# Patient Record
Sex: Female | Born: 1985 | Race: White | Hispanic: No | Marital: Single | Smoking: Current every day smoker
Health system: Southern US, Community
[De-identification: ages and names within clinical notes are randomized; demographics above are authoritative.]

## PROBLEM LIST (undated history)

## (undated) DIAGNOSIS — F419 Anxiety disorder, unspecified: Secondary | ICD-10-CM

## (undated) DIAGNOSIS — F1020 Alcohol dependence, uncomplicated: Secondary | ICD-10-CM

## (undated) DIAGNOSIS — K219 Gastro-esophageal reflux disease without esophagitis: Secondary | ICD-10-CM

## (undated) DIAGNOSIS — F17200 Nicotine dependence, unspecified, uncomplicated: Secondary | ICD-10-CM

## (undated) DIAGNOSIS — D696 Thrombocytopenia, unspecified: Secondary | ICD-10-CM

## (undated) DIAGNOSIS — B1920 Unspecified viral hepatitis C without hepatic coma: Secondary | ICD-10-CM

## (undated) DIAGNOSIS — D649 Anemia, unspecified: Secondary | ICD-10-CM

## (undated) DIAGNOSIS — K922 Gastrointestinal hemorrhage, unspecified: Secondary | ICD-10-CM

## (undated) DIAGNOSIS — F319 Bipolar disorder, unspecified: Secondary | ICD-10-CM

## (undated) DIAGNOSIS — K759 Inflammatory liver disease, unspecified: Secondary | ICD-10-CM

## (undated) DIAGNOSIS — F1011 Alcohol abuse, in remission: Secondary | ICD-10-CM

## (undated) DIAGNOSIS — F32A Depression, unspecified: Secondary | ICD-10-CM

## (undated) DIAGNOSIS — R569 Unspecified convulsions: Secondary | ICD-10-CM

## (undated) DIAGNOSIS — T8859XA Other complications of anesthesia, initial encounter: Secondary | ICD-10-CM

## (undated) DIAGNOSIS — R748 Abnormal levels of other serum enzymes: Secondary | ICD-10-CM

## (undated) DIAGNOSIS — T4145XA Adverse effect of unspecified anesthetic, initial encounter: Secondary | ICD-10-CM

## (undated) DIAGNOSIS — M1990 Unspecified osteoarthritis, unspecified site: Secondary | ICD-10-CM

## (undated) DIAGNOSIS — K226 Gastro-esophageal laceration-hemorrhage syndrome: Secondary | ICD-10-CM

## (undated) DIAGNOSIS — N189 Chronic kidney disease, unspecified: Secondary | ICD-10-CM

## (undated) DIAGNOSIS — C801 Malignant (primary) neoplasm, unspecified: Secondary | ICD-10-CM

## (undated) DIAGNOSIS — R011 Cardiac murmur, unspecified: Secondary | ICD-10-CM

## (undated) DIAGNOSIS — F209 Schizophrenia, unspecified: Secondary | ICD-10-CM

## (undated) DIAGNOSIS — F329 Major depressive disorder, single episode, unspecified: Secondary | ICD-10-CM

## (undated) DIAGNOSIS — E039 Hypothyroidism, unspecified: Secondary | ICD-10-CM

## (undated) DIAGNOSIS — S82141A Displaced bicondylar fracture of right tibia, initial encounter for closed fracture: Secondary | ICD-10-CM

## (undated) DIAGNOSIS — J45909 Unspecified asthma, uncomplicated: Secondary | ICD-10-CM

## (undated) HISTORY — PX: ORIF TIBIA PLATEAU: SHX2132

## (undated) HISTORY — PX: APPENDECTOMY: SHX54

## (undated) HISTORY — PX: DILATION AND CURETTAGE OF UTERUS: SHX78

## (undated) HISTORY — PX: HERNIA REPAIR: SHX51

---
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
## 2012-05-13 ENCOUNTER — Encounter (HOSPITAL_COMMUNITY): Payer: Self-pay | Admitting: *Deleted

## 2012-05-13 ENCOUNTER — Inpatient Hospital Stay (HOSPITAL_COMMUNITY)
Admission: AD | Admit: 2012-05-13 | Discharge: 2012-05-16 | DRG: 897 | Disposition: A | Discharge status: Home / Self Care | Payer: Medicaid Other | Attending: Psychiatry | Admitting: Psychiatry

## 2012-05-13 DIAGNOSIS — B1920 Unspecified viral hepatitis C without hepatic coma: Secondary | ICD-10-CM | POA: Diagnosis present

## 2012-05-13 DIAGNOSIS — F10239 Alcohol dependence with withdrawal, unspecified: Principal | ICD-10-CM | POA: Diagnosis not present

## 2012-05-13 DIAGNOSIS — F1994 Other psychoactive substance use, unspecified with psychoactive substance-induced mood disorder: Secondary | ICD-10-CM | POA: Diagnosis present

## 2012-05-13 DIAGNOSIS — F1020 Alcohol dependence, uncomplicated: Secondary | ICD-10-CM | POA: Diagnosis present

## 2012-05-13 DIAGNOSIS — Z560 Unemployment, unspecified: Secondary | ICD-10-CM

## 2012-05-13 DIAGNOSIS — F10939 Alcohol use, unspecified with withdrawal, unspecified: Principal | ICD-10-CM | POA: Diagnosis not present

## 2012-05-13 DIAGNOSIS — F39 Unspecified mood [affective] disorder: Secondary | ICD-10-CM | POA: Diagnosis present

## 2012-05-13 DIAGNOSIS — Z79899 Other long term (current) drug therapy: Secondary | ICD-10-CM

## 2012-05-13 HISTORY — DX: Major depressive disorder, single episode, unspecified: F32.9

## 2012-05-13 HISTORY — DX: Depression, unspecified: F32.A

## 2012-05-13 HISTORY — DX: Inflammatory liver disease, unspecified: K75.9

## 2012-05-13 MED ORDER — MAGNESIUM HYDROXIDE 400 MG/5ML PO SUSP: 30.0000 mL | Freq: Every day | ORAL | Status: DC | PRN

## 2012-05-13 MED ORDER — ONDANSETRON 4 MG PO TBDP
4.0000 mg | ORAL_TABLET | Freq: Four times a day (QID) | ORAL | Status: DC | PRN
  Administered 2012-05-13 – 2012-05-14 (×2): 4 mg via ORAL
  Filled 2012-05-13: qty 1

## 2012-05-13 MED ORDER — TRAZODONE HCL 50 MG PO TABS
50.0000 mg | ORAL_TABLET | Freq: Every evening | ORAL | Status: DC | PRN
  Administered 2012-05-13 – 2012-05-15 (×4): 50 mg via ORAL
  Filled 2012-05-13 (×9): qty 1

## 2012-05-13 MED ORDER — CHLORDIAZEPOXIDE HCL 25 MG PO CAPS
25.0000 mg | ORAL_CAPSULE | Freq: Four times a day (QID) | ORAL | Status: DC | PRN
  Administered 2012-05-13 – 2012-05-14 (×2): 25 mg via ORAL
  Filled 2012-05-13 (×2): qty 1

## 2012-05-13 MED ORDER — ADULT MULTIVITAMIN W/MINERALS CH
1.0000 | ORAL_TABLET | Freq: Every day | ORAL | Status: DC
  Filled 2012-05-13 (×2): qty 1

## 2012-05-13 MED ORDER — LOPERAMIDE HCL 2 MG PO CAPS: 2.0000 mg | ORAL_CAPSULE | ORAL | Status: DC | PRN

## 2012-05-13 MED ORDER — VITAMIN B-1 100 MG PO TABS
100.0000 mg | ORAL_TABLET | Freq: Every day | ORAL | Status: DC
  Filled 2012-05-13 (×2): qty 1

## 2012-05-13 MED ORDER — ACETAMINOPHEN 325 MG PO TABS: 650.0000 mg | ORAL_TABLET | Freq: Four times a day (QID) | ORAL | Status: DC | PRN

## 2012-05-13 MED ORDER — ALUM & MAG HYDROXIDE-SIMETH 200-200-20 MG/5ML PO SUSP: 30.0000 mL | ORAL | Status: DC | PRN

## 2012-05-13 MED ORDER — THIAMINE HCL 100 MG/ML IJ SOLN
100.0000 mg | Freq: Once | INTRAMUSCULAR | Status: AC
  Administered 2012-05-13: 100 mg via INTRAMUSCULAR

## 2012-05-13 MED ORDER — HYDROXYZINE HCL 25 MG PO TABS
25.0000 mg | ORAL_TABLET | Freq: Four times a day (QID) | ORAL | Status: DC | PRN
  Administered 2012-05-14 – 2012-05-16 (×5): 25 mg via ORAL

## 2012-05-13 MED ORDER — NICOTINE 21 MG/24HR TD PT24
21.0000 mg | MEDICATED_PATCH | Freq: Every day | TRANSDERMAL | Status: DC
  Administered 2012-05-13 – 2012-05-16 (×4): 21 mg via TRANSDERMAL
  Filled 2012-05-13 (×7): qty 1

## 2012-05-13 NOTE — Tx Team (Signed)
Initial Interdisciplinary Treatment Plan  PATIENT STRENGTHS: (choose at least two) Ability for insight Average or above average intelligence Capable of independent living Communication skills General fund of knowledge Motivation for treatment/growth Physical Health Supportive family/friends  PATIENT STRESSORS: Financial difficulties Substance abuse   PROBLEM LIST: Problem List/Patient Goals Date to be addressed Date deferred Reason deferred Estimated date of resolution  "To detox" 05/13/12     Increased risk for suicide 05/13/12     Substance abuse 05/13/12                                          DISCHARGE CRITERIA:  Ability to meet basic life and health needs Adequate post-discharge living arrangements Improved stabilization in mood, thinking, and/or behavior Medical problems require only outpatient monitoring Motivation to continue treatment in a less acute level of care Need for constant or close observation no longer present Reduction of life-threatening or endangering symptoms to within safe limits Safe-care adequate arrangements made Verbal commitment to aftercare and medication compliance Withdrawal symptoms are absent or subacute and managed without 24-hour nursing intervention  PRELIMINARY DISCHARGE PLAN: Attend 12-step recovery group Outpatient therapy Participate in family therapy Placement in alternative living arrangements  PATIENT/FAMIILY INVOLVEMENT: This treatment plan has been presented to and reviewed with the patient, Dawn Foley, and/or family member.  The patient and family have been given the opportunity to ask questions and make suggestions.  Fransico Michael [facility] 05/13/2012, 9:32 PM

## 2012-05-13 NOTE — BH Assessment (Signed)
Assessment Note   Dawn Foley is an 26 y.o. female. Patient presents to [facility] emergency room requesting detox from alcohol.  She also endorses suicidal ideation with a plan to drink herself to death. Patients BAC was 0.30, patient has presented  to the hospital four times in the last week and 1/2, requesting detox. She typically drinks 18 pack of beer and  gal of liquor daily.  Current CIWA 11 and is recieving ativan in ED for withdrawel symptoms. She reports she is  drinking this amount for two months after release from prison. (six months DWI).  Extensive history of chronic alcoholism and describe six attempts in treatment.  Denies homicidal ideation denies, symptoms of psychosis.  Endorses depressive symptoms; despondency, irritability, hopelessness, worthlessness, loss of appetite, insomnia and guilt.  Patient is alert and oriented, tearful at times, c/o anxiety, racing thoughts. She has left after a few days of treatment each time she has sought treatment. Accepted for inpatient treatment by Dr. Franchot Gallo MD   Axis I: Mood Disorder NOS and Alcohol Dep Axis II: Deferred Axis III:  Past Medical History  Diagnosis Date  . Depression    Axis IV: housing problems, other psychosocial or environmental problems and problems with primary support group Axis V: 31-40 impairment in reality testing  Past Medical History:  Past Medical History  Diagnosis Date  . Depression     No past surgical history on file.  Family History: No family history on file.  Social History:  reports that she has been smoking Cigarettes.  She has been smoking about 1 pack per day. She does not have any smokeless tobacco history on file. She reports that she drinks about 35.4 ounces of alcohol per week. She reports that she does not use illicit drugs.  Additional Social History:  Alcohol / Drug Use Pain Medications: not abusing Prescriptions: not abusing Over the Counter: nos History of  alcohol / drug use?: Yes Substance #1 Name of Substance 1: alcohol 1 - Age of First Use: 14 1 - Amount (size/oz): 18 beers and 1/2 gallon liquor 1 - Frequency: daily 1 - Duration: 2 months 1 - Last Use / Amount: 05/13/2012  CIWA: CIWA-Ar Nausea and Vomiting: no nausea and no vomiting Tactile Disturbances: none Tremor: two Auditory Disturbances: very mild harshness or ability to frighten Paroxysmal Sweats: no sweat visible Visual Disturbances: very mild sensitivity Anxiety: three Headache, Fullness in Head: none present Agitation: moderately fidgety and restless Orientation and Clouding of Sensorium: oriented and can do serial additions CIWA-Ar Total: 11  COWS:    Allergies: Allergies no known allergies  Home Medications:  (Not in a hospital admission)  OB/GYN Status:  No LMP recorded.  General Assessment Data Location of Assessment: [facility] Services Living Arrangements: Spouse/significant other Can pt return to current living arrangement?: Yes Admission Status: Voluntary Is patient capable of signing voluntary admission?: Yes Transfer from: Acute Hospital Referral Source: MD  Education Status Is patient currently in school?: No Highest grade of school patient has completed: 12  Risk to self Suicidal Ideation: Yes-Currently Present Suicidal Intent: Yes-Currently Present Is patient at risk for suicide?: Yes Suicidal Plan?: Yes-Currently Present Specify Current Suicidal Plan: drink herself to death Access to Means: Yes Specify Access to Suicidal Means: drinks daily What has been your use of drugs/alcohol within the last 12 months?: alcohol Previous Attempts/Gestures: No Intentional Self Injurious Behavior: None Family Suicide History: Unknown Persecutory voices/beliefs?: No Depression: Yes Depression Symptoms: Guilt;Feeling worthless/self pity;Feeling angry/irritable Substance abuse  history and/or treatment for substance abuse?: Yes Suicide prevention  information given to non-admitted patients: Not applicable  Risk to Others Homicidal Ideation: No Thoughts of Harm to Others: No Current Homicidal Intent: No Current Homicidal Plan: No Access to Homicidal Means: No History of harm to others?: No Assessment of Violence: None Noted Does patient have access to weapons?: No Criminal Charges Pending?: No Does patient have a court date: No  Psychosis Hallucinations: None noted Delusions: None noted  Mental Status Report Appear/Hygiene: Disheveled Eye Contact: Fair Motor Activity: Restlessness;Agitation Speech: Logical/coherent Level of Consciousness: Alert Mood: Anxious;Depressed;Worthless, low self-esteem Affect: Anxious;Depressed Anxiety Level: Moderate Thought Processes: Coherent;Relevant Judgement: Impaired Orientation: Person;Place;Time;Situation Obsessive Compulsive Thoughts/Behaviors: None  Cognitive Functioning Concentration: Decreased Memory: Recent Intact;Remote Intact IQ: Average Insight: Poor Impulse Control: Poor Appetite: Fair Sleep: Decreased Total Hours of Sleep:  (not restful) Vegetative Symptoms: Decreased grooming  ADLScreening [facility]) Patient's cognitive ability adequate to safely complete daily activities?: Yes Patient able to express need for assistance with ADLs?: Yes Independently performs ADLs?: Yes (appropriate for developmental age)  Abuse/Neglect [facility]) Physical Abuse: Denies Verbal Abuse: Denies Sexual Abuse: Denies  Prior Inpatient Therapy Prior Inpatient Therapy: Yes Prior Therapy Dates: 2012, 04/2012 Prior Therapy Facilty/Provider(s): [facility] Reason for Treatment: Alcohol Dep  Prior Outpatient Therapy Prior Outpatient Therapy: Yes Prior Therapy Dates: several pt does not specify Prior Therapy Facilty/Provider(s): pt can not specify Reason for Treatment: alcohol Dep  ADL Screening (condition at time of admission) Patient's cognitive ability  adequate to safely complete daily activities?: Yes Patient able to express need for assistance with ADLs?: Yes Independently performs ADLs?: Yes (appropriate for developmental age) Weakness of Legs: None Weakness of Arms/Hands: None  Home Assistive Devices/Equipment Home Assistive Devices/Equipment: None    Abuse/Neglect Assessment (Assessment to be complete while patient is alone) Physical Abuse: Denies Verbal Abuse: Denies Sexual Abuse: Denies Exploitation of patient/patient's resources: Denies Self-Neglect: Denies     Merchant navy officer (For Healthcare) Advance Directive: Patient does not have advance directive Pre-existing out of facility DNR order (yellow form or pink MOST form): No Nutrition Screen- [facility] Adult/[facility]/AP Patient's home diet: Regular  Additional Information 1:1 In Past 12 Months?: No CIRT Risk: No Elopement Risk: No Does patient have medical clearance?: Yes     Disposition:  Disposition Disposition of Patient: Inpatient treatment program  On Site Evaluation by:   Reviewed with Physician:     Conan Bowens 05/13/2012 6:00 PM

## 2012-05-14 DIAGNOSIS — F1020 Alcohol dependence, uncomplicated: Secondary | ICD-10-CM | POA: Diagnosis present

## 2012-05-14 MED ORDER — ONDANSETRON 4 MG PO TBDP: 4.0000 mg | ORAL_TABLET | Freq: Four times a day (QID) | ORAL | Status: DC | PRN

## 2012-05-14 MED ORDER — NAPROXEN 500 MG PO TABS: 500.0000 mg | ORAL_TABLET | Freq: Two times a day (BID) | ORAL | Status: DC | PRN

## 2012-05-14 MED ORDER — CHLORDIAZEPOXIDE HCL 25 MG PO CAPS
25.0000 mg | ORAL_CAPSULE | Freq: Three times a day (TID) | ORAL | Status: AC
  Administered 2012-05-15 (×3): 25 mg via ORAL
  Filled 2012-05-14 (×3): qty 1

## 2012-05-14 MED ORDER — CHLORDIAZEPOXIDE HCL 25 MG PO CAPS: 25.0000 mg | ORAL_CAPSULE | Freq: Every day | ORAL | Status: DC

## 2012-05-14 MED ORDER — CLONIDINE HCL 0.1 MG PO TABS: 0.1000 mg | ORAL_TABLET | ORAL | Status: DC

## 2012-05-14 MED ORDER — CLONIDINE HCL 0.1 MG PO TABS
0.1000 mg | ORAL_TABLET | Freq: Four times a day (QID) | ORAL | Status: DC
  Filled 2012-05-14: qty 1

## 2012-05-14 MED ORDER — VITAMIN B-1 100 MG PO TABS
100.0000 mg | ORAL_TABLET | Freq: Every day | ORAL | Status: DC
  Administered 2012-05-15 – 2012-05-16 (×2): 100 mg via ORAL
  Filled 2012-05-14 (×3): qty 1

## 2012-05-14 MED ORDER — ONDANSETRON 4 MG PO TBDP
4.0000 mg | ORAL_TABLET | Freq: Four times a day (QID) | ORAL | Status: DC | PRN
  Administered 2012-05-14: 4 mg via ORAL

## 2012-05-14 MED ORDER — DICYCLOMINE HCL 20 MG PO TABS: 20.0000 mg | ORAL_TABLET | Freq: Four times a day (QID) | ORAL | Status: DC | PRN

## 2012-05-14 MED ORDER — HYDROXYZINE HCL 25 MG PO TABS: 25.0000 mg | ORAL_TABLET | Freq: Four times a day (QID) | ORAL | Status: DC | PRN

## 2012-05-14 MED ORDER — LOPERAMIDE HCL 2 MG PO CAPS: 2.0000 mg | ORAL_CAPSULE | ORAL | Status: DC | PRN

## 2012-05-14 MED ORDER — CHLORDIAZEPOXIDE HCL 25 MG PO CAPS
25.0000 mg | ORAL_CAPSULE | Freq: Four times a day (QID) | ORAL | Status: AC
  Administered 2012-05-14 (×4): 25 mg via ORAL
  Filled 2012-05-14 (×4): qty 1

## 2012-05-14 MED ORDER — CLONIDINE HCL 0.1 MG PO TABS: 0.1000 mg | ORAL_TABLET | Freq: Every day | ORAL | Status: DC

## 2012-05-14 MED ORDER — CHLORDIAZEPOXIDE HCL 25 MG PO CAPS
25.0000 mg | ORAL_CAPSULE | ORAL | Status: DC
  Administered 2012-05-16: 25 mg via ORAL
  Filled 2012-05-14: qty 1

## 2012-05-14 MED ORDER — ADULT MULTIVITAMIN W/MINERALS CH
1.0000 | ORAL_TABLET | Freq: Every day | ORAL | Status: DC
  Administered 2012-05-14 – 2012-05-16 (×3): 1 via ORAL
  Filled 2012-05-14 (×4): qty 1

## 2012-05-14 MED ORDER — METHOCARBAMOL 500 MG PO TABS: 500.0000 mg | ORAL_TABLET | Freq: Three times a day (TID) | ORAL | Status: DC | PRN

## 2012-05-14 MED ORDER — CHLORDIAZEPOXIDE HCL 25 MG PO CAPS
25.0000 mg | ORAL_CAPSULE | Freq: Four times a day (QID) | ORAL | Status: DC | PRN
Start: 2012-05-14 — End: 2012-05-16
  Administered 2012-05-14 – 2012-05-16 (×6): 25 mg via ORAL
  Filled 2012-05-14 (×6): qty 1

## 2012-05-14 MED ORDER — IBUPROFEN 200 MG PO TABS: 200.0000 mg | ORAL_TABLET | Freq: Four times a day (QID) | ORAL | Status: DC | PRN

## 2012-05-14 NOTE — Treatment Plan (Addendum)
Interdisciplinary Treatment Plan Update (Adult)  Date: 05/14/2012  Time Reviewed: 9:45 AM   Progress in Treatment: Attending groups: Yes Participating in groups: Yes Taking medication as prescribed: Yes Tolerating medication: Yes   Family/Significant other contact made: No   Patient understands diagnosis:  Yes  As evidenced by asking for help with detox from alcohol Discussing patient identified problems/goals with staff:  Yes  See below Medical problems stabilized or resolved:  Yes Denies suicidal/homicidal ideation: Yes  In tx team Issues/concerns per patient self-inventory:  Yes  Depression, hopelessness, anxiety are 10's  C/O withdrawal symptoms,   Other:  New problem(s) identified: N/A  Reason for Continuation of Hospitalization: Medication stabilization Withdrawal symptoms  Interventions implemented related to continuation of hospitalization:  Librium taper, encourage group attendance and participation, evaluate for mood stabilization medication  Additional comments:  Estimated length of stay:2-3 days  Discharge Plan:unknown  New goal(s): N/A  Review of initial/current patient goals per problem list:   1.  Goal(s): Safely detox from alcohol  Met:  No  Target date:9/7  As evidenced ZO:XWRUEA vitals, no withdrawal symptoms  2.  Goal (s):Identify comprehensive sobriety plan  Met:  No  Target date:9/7  As evidenced VW:[id number] report  3.  Goal(s):Eliminate SI  Met:  Yes  Target date:9/4  As evidenced XB:JYNW report in tx team  4.  Goal(s):Stabilize mood  Met:  No  Target date:9/7  As evidenced GN:FAOZHY will rate her depression at a 4 or less  Attendees: Patient:  Dawn Foley 05/14/2012 9:45 AM  Family:     Physician:  Lupe Carney 05/14/2012 9:45 AM   Nursing: [name] Louis   05/14/2012 9:45 AM   Case Manager:  Richelle Ito, LCSW 05/14/2012 9:45 AM   Counselor:  Ronda Fairly, LCSWA 05/14/2012 9:45 AM   Other:     Other:     Other:     Other:       Scribe for Treatment Team:   Ida Rogue, 05/14/2012 9:45 AM

## 2012-05-14 NOTE — Progress Notes (Signed)
Psychoeducational Group Note  Date:  05/14/2012 Time:  2000 Group Topic/Focus:  AA group  Participation Level:  Active  Participation Quality:  Appropriate  Affect:  Appropriate  Cognitive:  Appropriate  Insight:  Good  Engagement in Group:  Good  Additional Comments:    Dawn Foley Patience 05/14/2012, 8:57 PM

## 2012-05-14 NOTE — Progress Notes (Signed)
Psychoeducational Group Note  Date:  05/14/2012 Time:  1100  Group Topic/Focus:  Personal Choices and Values:   The focus of this group is to help patients assess and explore the importance of values in their lives, how their values affect their decisions, how they express their values and what opposes their expression.  Participation Level: Did Not Attend  Participation Quality:  Not Applicable  Affect:  Not Applicable  Cognitive:  Not Applicable  Insight:  Not Applicable  Engagement in Group: Not Applicable  Additional Comments:  Pt did not attend group but remained resting in bed.  Sharyn Lull 05/14/2012, 12:56 PM

## 2012-05-14 NOTE — [facility] Suicide Risk Assessment (Signed)
Suicide Risk Assessment  Admission Assessment     Nursing information obtained from:  Patient  Demographic factors:  Unemployed  Current Mental Status:  Patient seen and evaluated. Chart reviewed. Patient stated that her mood was "not good". Her affect was mood congruent and constricted. She denied any current thoughts of self injurious behavior, suicidal ideation or homicidal ideation. There were no auditory or visual hallucinations, paranoia, delusional thought processes, or mania noted.  Thought process was linear and goal directed.  Psychomotor retardation noted and pt not feeling well. Speech was normal rate, tone and volume. Eye contact was poor. Judgment and insight are limited. No acute safety concerns reported from team.   Loss Factors:  Financial problems / change in socioeconomic status  Historical Factors:  Family history of mental illness or substance abuse; denied hx SI/SIb; denied legal Hx; preg test neg at OSH; neg HIV  Risk Reduction Factors:  Responsible for children under 40 years of age;Sense of responsibility to family;Living with another person, especially a relative  CLINICAL FACTORS: Alcohol Use & W/D Disorder; HepC; SIMD; past w/d seizure  COGNITIVE FEATURES THAT CONTRIBUTE TO RISK: none.  SUICIDE RISK: Pt viewed as a chronic moderate increased risk of harm to self in light of her past hx and risk factors.  No acute safety concerns on the unit.  Pt contracting for safety and in need of crisis stabilization & Tx.  PLAN OF CARE: Pt admitted for crisis stabilization, detox off alcohol and treatment.  Please see orders.   Medications reviewed with pt and medication education provided.  Will continue q15 minute checks per unit protocol.  No clinical indication for one on one level of observation at this time.  Pt contracting for safety.  Mental health treatment, medication management and continued sobriety will mitigate against the increased risk of harm to self and/or  others.  Discussed the importance of recovery with pt, as well as, tools to move forward in a healthy & safe manner.  Pt agreeable with the plan.  Discussed with the team.   Dawn Foley 05/14/2012, 11:11 AM

## 2012-05-14 NOTE — H&P (Signed)
Psychiatric Admission Assessment Adult  Patient Identification:  Dawn Foley Date of Evaluation:  05/14/2012 Chief Complaint:  Alcohol Dependence; Mood Disorder MOS History of Present Illness::  Pt is a 26 y/o WF accepted from [facility] for Alcohol detox. Pt has been evaluated and medically cleared prior to disposition too [facility]. Pt states she consumes a case of beer to a 1/2 galloon of liquor daily with last drink yesterday around 10 am when she consumed 18 beers. Pt started drinking at age 25 and has remote h/o of Dt's. repercussions of her Alcohol dependence include DUI x two with prior imprisonment. Pt has also dropped out of college, has been unemployed and is currently single. Pt denies a family h/o of alcohol and or polysubstance abuse. Pt also feels depressed and rates her mood a 10/10. Her depressive sx include feeling sad, having guilt, helplessness,hopelessness and worthlessness in addition to anhedonia. Pt denies SI/SA/HI/AVH or delusional thoughts. Pt has also been sx or racing thoughts and problems with concentration. Pt also only gets 3 - 4 hours sleep daily. Pt rates her anxiety sx 9/10 with an occasional panic attack. Pt denies h/o of ADHD or PTSD.  ROS:  Psych: see HPI  As per HPI rest of 12 point ROS negative  Mood Symptoms:  Guilt, Helplessness, Worthlessness, Depression Symptoms:  panic attacks, insomnia, disturbed sleep, (Hypo) Manic Symptoms:  none Anxiety Symptoms:  Panic Symptoms, Psychotic Symptoms:  none  PTSD Symptoms: none  Past Psychiatric History: Diagnosis:  Hospitalizations:  Outpatient Care:  Substance Abuse Care:  Self-Mutilation:  Suicidal Attempts:  Violent Behaviors:   Past Medical History:   Past Medical History  Diagnosis Date  . Depression   . Hepatitis    None. Allergies:  Allergies no known allergies PTA Medications: No prescriptions prior to admission    Previous Psychotropic Medications:  Medication/Dose                 Substance Abuse History in the last 12 months: Substance Age of 1st Use Last Use Amount Specific Type  Nicotine      Alcohol      Cannabis      Opiates      Cocaine      Methamphetamines      LSD      Ecstasy      Benzodiazepines      Caffeine      Inhalants      Others:                         Consequences of Substance Abuse: Legal Consequences:  DUI x two  Social History: Current Place of Residence:   Place of Birth:   Family Members: Marital Status:  Single Children:  Sons:  Daughters: Relationships: Education:  Corporate treasurer Problems/Performance: Religious Beliefs/Practices: History of Abuse (Emotional/Phsycial/Sexual) Occupational Experiences; Military History:  None. Legal History: Hobbies/Interests:  Family History:  No family history on file.  Mental Status Examination/Evaluation: Objective:  Appearance: Disheveled  Eye Contact::  Good  Speech:  Normal Rate  Volume:  Normal  Mood:  Euthymic  Affect:  Tearful  Thought Process:  Circumstantial  Orientation:  Full  Thought Content:  Rumination  Suicidal Thoughts:  No  Homicidal Thoughts:  No  Memory:  Immediate;   Fair  Judgement:  Intact  Insight:  Good  Psychomotor Activity:  Normal  Concentration:  Good  Recall:  Good  Akathisia:  No  Handed:  Right  AIMS (  if indicated):     Assets:  Communication Skills  Sleep:       Laboratory/X-Ray Psychological Evaluation(s)      Assessment:    AXIS I:  Alcohol Abuse and Mood Disorder NOS AXIS II:  No diagnosis AXIS III:   Past Medical History  Diagnosis Date  . Depression   . Hepatitis    AXIS IV:  other psychosocial or environmental problems AXIS V:  21-30 behavior considerably influenced by delusions or hallucinations OR serious impairment in judgment, communication OR inability to function in almost all areas  Treatment Plan/Recommendations: 1) Admission for intensive Alcohol Detox 2) psychotherapy and possible  psychotropic Tx of Mood D/o associated with Alcohol abuse  Treatment Plan Summary: Daily contact with patient to assess and evaluate symptoms and progress in treatment Current Medications:  Current Facility-Administered Medications  Medication Dose Route Frequency Provider Last Rate Last Dose  . acetaminophen (TYLENOL) tablet 650 mg  650 mg Oral Q6H PRN Kerry Hough, PA      . alum & mag hydroxide-simeth (MAALOX/MYLANTA) 200-200-20 MG/5ML suspension 30 mL  30 mL Oral Q4H PRN Kerry Hough, PA      . chlordiazePOXIDE (LIBRIUM) capsule 25 mg  25 mg Oral Q6H PRN Kerry Hough, PA   25 mg at 05/13/12 2226  . hydrOXYzine (ATARAX/VISTARIL) tablet 25 mg  25 mg Oral Q6H PRN Kerry Hough, PA      . loperamide (IMODIUM) capsule 2-4 mg  2-4 mg Oral PRN Kerry Hough, PA      . magnesium hydroxide (MILK OF MAGNESIA) suspension 30 mL  30 mL Oral Daily PRN Kerry Hough, PA      . multivitamin with minerals tablet 1 tablet  1 tablet Oral Daily Kerry Hough, PA      . nicotine (NICODERM CQ - dosed in mg/24 hours) patch 21 mg  21 mg Transdermal Q0600 Kerry Hough, PA   21 mg at 05/13/12 2228  . ondansetron (ZOFRAN-ODT) disintegrating tablet 4 mg  4 mg Oral Q6H PRN Kerry Hough, PA   4 mg at 05/13/12 2226  . thiamine (B-1) injection 100 mg  100 mg Intramuscular Once Kerry Hough, PA   100 mg at 05/13/12 2223  . thiamine (VITAMIN B-1) tablet 100 mg  100 mg Oral Daily Kerry Hough, PA      . traZODone (DESYREL) tablet 50 mg  50 mg Oral QHS,MR X 1 Kerry Hough, PA   50 mg at 05/13/12 2226    Observation Level/Precautions:  Detox  Laboratory:  TSh, freeT4  Psychotherapy:    Medications:    Routine PRN Medications:  Yes  Consultations:    Discharge Concerns:    Other:     SIMON,SPENCER E 9/4/20131:40 AM

## 2012-05-14 NOTE — Progress Notes (Signed)
Pt having a lot of symptoms this morning and was not feeling well.  She was given zofran vistaril and librium though out the day which has been helpful.  She rated all her depression hopelessness and anxiety a 10 today on her self-inventory.  Her CIWA was a 8. She reported sleep poor appetite poor energy low and ability to pay attention poor.   Pt denies any S/H ideations or A/V hallucinations.  She is thinking about going to long-term treatment from here.

## 2012-05-14 NOTE — H&P (Signed)
Pt seen and evaluated upon admission.  Completed Admission Suicide Risk Assessment.  See orders.  Pt agreeable with plan.  Discussed with team.   
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
## 2012-05-14 NOTE — Progress Notes (Signed)
[facility] Group Notes:  (Counselor/Nursing/MHT/Case Management/Adjunct)  05/14/2012 3:14 PM  Type of Therapy:  Group Therapy at 1:13 to 2:30 PM  Participation Level:  Did Not Attend  Dawn Foley 05/14/2012, 3:14 PM

## 2012-05-14 NOTE — Progress Notes (Signed)
Pt was cooperative, but tearful and anxious during the adm process. Writer noticed a cig on the floor in the hall. Asked pt if the cig belonged to her, she denied. Writer observed pt's behavior and during skin assessment found 4 cig and matches, on the floor in the corner. Informed pt they would be disposed of and that her adm and visitation could be affected by her act. Reminded pt of treatment agreement.  Per report from ER, pt stated that she would drink herself to death.  Pt agreed with info given from ER nurse.  Pt stated that she drinks 3 cases of beer daily. Writer informed the pt that would mean she drinks at least 72 beers daily. Pt acknowledge and confirmed the quantity. Longest period of sobriety was 6 months while incarcerated.  Hx of colon cancer and hep C. Pt acknowledged understanding of treatment agreement and voiced no concerns. Support and encouragement was offered.

## 2012-05-14 NOTE — Discharge Planning (Signed)
Patient did not attend morning group but came to treatment planning. Pt did not appear to be feeling well and was extremely lethargic. She had eyes closed and head on table during session. Pt stated that she came from [facility] and has not experienced s/i but told hospital staff that she did have s/i in order to be seen quicker. Pt reported that she had one seizure in the past when attempting to stop drinking and is positive for hepC. Pt is not open to rehab options at this time.

## 2012-05-14 NOTE — Progress Notes (Deleted)
[facility] Adult Inpatient Family/Significant Other Suicide Prevention Education  Suicide Prevention Education:  Patient Discharged to Other Healthcare Facility:  Suicide Prevention Education Not Provided during Contact to family by other staff members The patient is discharging to another healthcare facility, [facility], for continuation of treatment.  The patient's medical information, including suicide ideations and risk factors, are a part of the medical information shared with the receiving healthcare facility. Patient's brother will be picking her up at discharge between noon and 1 PM today and taking to airport for her flight to [facility], patient will travel without brother or other companion and reports she feels safe making journey.  Counselor and physician spoke with patient in treatment team about resources to use during travel to treatment center should patient once again experience suicidal ideation.  Writer provided suicide prevention education directly to patient after treatment team; conversation included risk factors, warning signs and resources to contact for help.Clide Dales 05/14/2012, 11:44 AM

## 2012-05-14 NOTE — Progress Notes (Signed)
Patient ID: Dawn Foley, female   DOB: 1986-09-09, 26 y.o.   MRN: [id number] D: Pt. Demanding requesting discharge "I signed myself in here and this paper says I can go home if I'm not involuntary." Writer continued to read information given to pt. And ask her to please note that the information says she can sign for 72hr discharge and after seen by physician if she is not at harm to self based on assessment she will be discharged. Pt. Unhappy crying requesting meds. A: Writer reviewed med regime and administered Vistaril 25mg  (see MAR). Pt. Encouraged to give meds time to take effect and use coping skills, also told client other meds can be given in a hour according to protocol. Pt. Encouraged to attend group. Staff will monitor q24min for safety. R: Pt. Took meds, remains safe on the unit. Pt. Attended group.

## 2012-05-15 DIAGNOSIS — F39 Unspecified mood [affective] disorder: Secondary | ICD-10-CM

## 2012-05-15 DIAGNOSIS — F1010 Alcohol abuse, uncomplicated: Secondary | ICD-10-CM

## 2012-05-15 MED ORDER — CITALOPRAM HYDROBROMIDE 20 MG PO TABS
20.0000 mg | ORAL_TABLET | Freq: Every day | ORAL | Status: DC
  Administered 2012-05-15 – 2012-05-16 (×2): 20 mg via ORAL
  Filled 2012-05-15 (×4): qty 1

## 2012-05-15 NOTE — Progress Notes (Addendum)
[facility] In Patient Progress Note  05/15/2012 3:53 PM Dawn Foley 11-May-1986 [id number] 2  Diagnosis:  Axis I: Alcohol Use & W/D Disorder; HepC; SIMD; past w/d seizure  ADL's:  intact  Sleep:  No  Appetite: "not good but improving."  Suicidal Ideation: No suicidal ideation, no plan, no intent, no means.  Homicidal Ideation:  No homicidal ideation, no plan, no intent, no means.  Subjective: Dawn Foley states she is doing well and has no new complaints. She has signed a 72 request for discharge and is very adamant about leaving in 48 hours and counts the day she spent in the ED as her first day.  She is focused on this and is not open to any further information.      oral temperature is 97.1 F (36.2 C). Her blood pressure is 91/62 and her pulse is 93. Her respiration is 16.   Objective: Patient denies problems. Speech is clear, goal directed and mood is euthymic. She is rigid and closed minded. Mental Status:  alert Level of Consciousness:  Alert Orientation: x 2 General Appearance : fairly groomed Behavior:  cooperative Eye Contact:  Fair Motor Behavior:  Normal Speech:  Normal Mood:  Euthymic Affect:  Appropriate Anxiety Level:  None Thought Process:  Coherent Thought Content:  WNL Perception:  Normal Judgment:  Fair Insight:  Present Cognition:  At least average Sleep:  Number of Hours: 6.75   Lab Results: No results found for this or any previous visit (from the past 48 hour(s)). Labs are reviewed. Physical Findings: AIMS: Facial and Oral Movements Muscles of Facial Expression: None, normal Lips and Perioral Area: None, normal Jaw: None, normal Tongue: None, normal,Extremity Movements Upper (arms, wrists, hands, fingers): None, normal Lower (legs, knees, ankles, toes): None, normal, Trunk Movements Neck, shoulders, hips: None, normal, Overall Severity Severity of abnormal movements (highest score from questions above): None, normal Incapacitation due to abnormal  movements: None, normal Patient's awareness of abnormal movements (rate only patient's report): No Awareness, Dental Status Current problems with teeth and/or dentures?: No Does patient usually wear dentures?: No  CIWA:  CIWA-Ar Total: 4  COWS:    Medication:  . chlordiazePOXIDE  25 mg Oral QID   Followed by  . chlordiazePOXIDE  25 mg Oral TID   Followed by  . chlordiazePOXIDE  25 mg Oral BH-qamhs   Followed by  . chlordiazePOXIDE  25 mg Oral Daily  . multivitamin with minerals  1 tablet Oral Daily  . nicotine  21 mg Transdermal Q0600  . thiamine  100 mg Oral Daily  . traZODone  50 mg Oral QHS,MR X 1   Treatment Plan Summary: Pt admitted for crisis stabilization, detox off alcohol and treatment. Please see orders. Medications reviewed with pt and medication education provided. Pt contracting for safety. Mental health treatment, medication management and continued sobriety will mitigate against the increased risk of harm to self and/or others. Discussed the importance of recovery with pt, as well as, tools to move forward in a healthy & safe manner. Pt agreeable with the plan. Discussed with the team.   Plan: 1. Continue with the [facility] detox protocol. 2. TSH, T4 pending 3. Anticipating discharge out when 0 symptoms of withdrawal and patient is medically stable. 4. Will start Celexa for depression and anxiety at patient's request 20mg  po qd. Rona Ravens. Dawn Foley PAC 05/15/2012, 3:53 PM

## 2012-05-15 NOTE — Progress Notes (Signed)
Patient did attend the evening karaoke group.  

## 2012-05-15 NOTE — Progress Notes (Signed)
Patient ID: Dawn Foley, female   DOB: [birth year], 26 y.o.   MRN: [id number] D: Pt. In dayroom, interaction on the milieu. Pt. Smiling, reports that she has plans to attend AA group upon discharge. Pt. Wants to continue school "want to RN degree."  Pt. Wearing short PJ's. Pt. Noted being friendly and walking the hall with another female client. A: Writer provides emotional support, encourages pt. To follow her dreams and continue goals for sobriety. Staff will monitor q32min for safety and note boundaries between pt. And friendly female client. Writer encourages group. Writer encourages pt. To put on less revealing clothing. Client asks staff to retrieve pants from locker and cut strings out of them. Lillia Abed MHT given permission to retrieve pants from locker.  R: Pt. Puts on longer pants. Pt. Attends karaoke. Pt. Remains safe on the unit.

## 2012-05-15 NOTE — Progress Notes (Signed)
Pt. Refused labs last pm, writer rescheduled for 0600 and pt. Refused again. Pt. Angry resistant to tx. "I'm going home."

## 2012-05-15 NOTE — Progress Notes (Signed)
Writer provided suicide prevention education to patient's grandmother with whom she lives the majority of time and Mrs Clydie Braun ([place] at [phone]) reports: -She took patient to ED three consecutive days before patient said she was suicidal in order to get admitted - Patient served 6 months in court mandated substance abuse program and drank the day she was discharged - Grandmother feels patient will drink upon release  - Grandmother describes patient's contact with her children, who live with her mother, as minimal, consisting of "maybe 10 minutes when she does choose to visit them."  Clide Dales 05/16/2012 9:58 AM

## 2012-05-15 NOTE — Progress Notes (Signed)
D:  Pt about on the unit today.  She has complained of not feeling well today, says she has anxiety.  She denies SI/HI but does rate her depression as a 10/10 and hopelessness as a 101/10.  She has been attending groups on the unit.  Does not mention to RN regarding going home today.  A:  Given meds as prescribed and given prn for anxiety.  R:  Pt receptive to staff.  Remains on q 15 minute checks for safety.  Will continue to monitor.

## 2012-05-15 NOTE — Progress Notes (Signed)
[facility] Adult Inpatient Family/Significant Other Suicide Prevention Education  Suicide Prevention Education:  Education Completed; Clydie Braun, patient's grandmother, at [phone] has been identified by the patient as the family member/significant other with whom the patient will be residing, and identified as the person(s) who will aid the patient in the event of a mental health crisis (suicidal ideations/suicide attempt).  With written consent from the patient, the family member/significant other has been provided the following suicide prevention education, prior to the and/or following the discharge of the patient.  The suicide prevention education provided includes the following:  Suicide risk factors  Suicide prevention and interventions  National Suicide Hotline telephone number  [facility] assessment telephone number  [facility] Emergency Assistance 911  [facility] and/or Residential Mobile Crisis Unit telephone number  Request made of family/significant other to:  Remove weapons (e.g., guns, rifles, knives), all items previously/currently identified as safety concern.    Remove drugs/medications (over-the-counter, prescriptions, illicit drugs), all items previously/currently identified as a safety concern.  Mrs Lorella Nimrod reports that she has firearms and narcotic medicines secured in locked cabinets in her home.  She often feels need to secure automobile keys, cash and credit cards in home when patient is there and Ms Lorella Nimrod states pates stays at her home probably 60-70% of the time verses with her boyfriend.   The family member/significant other verbalizes understanding of the suicide prevention education information provided.  The family member/significant other agrees to remove the items of safety concern listed above.  Clide Dales 05/15/2012, 1:01 PM

## 2012-05-15 NOTE — Progress Notes (Signed)
Psychoeducational Group Note  Date:  05/15/2012 Time:  1100  Group Topic/Focus:  Building Self Esteem:   The Focus of this group is helping patients become aware of the effects of self-esteem on their lives, the things they and others do that enhance or undermine their self-esteem, seeing the relationship between their level of self-esteem and the choices they make and learning ways to enhance self-esteem.  Participation Level:  Active  Participation Quality:  Appropriate, Attentive and Sharing  Affect:  Appropriate  Cognitive:  Appropriate  Insight:  Good  Engagement in Group:  Good  Additional Comments:  Pt participated in self esteem group. Pt defined self-esteem in own terms and difference between low and high self-esteem. Pt shared what were negative and positive physical, emotional, internal, and external things that increase and decrease self-esteem. Pt completed the self-esteem handout and discussed and shared answers during group. Pt stated she enjoyed laughing with being apart of her building up her self-esteem.   Dawn Foley Brittini 05/15/2012, 1:13 PM

## 2012-05-15 NOTE — Discharge Planning (Signed)
Dawn Foley attended AM group, good participation.  States she is ready for d/c.  I assured her she would not be leaving today.  She accepted this with no problem.  Stated she signed a 72 hr request and is OK with d/c on Sat.  Plans to follow up with [facility] in [place] and AA mtgs.

## 2012-05-16 DIAGNOSIS — F1020 Alcohol dependence, uncomplicated: Secondary | ICD-10-CM

## 2012-05-16 MED ORDER — TRAZODONE HCL 50 MG PO TABS: 50.0000 mg | ORAL_TABLET | Freq: Every day | ORAL | Status: DC

## 2012-05-16 MED ORDER — TRAZODONE HCL 50 MG PO TABS
50.0000 mg | ORAL_TABLET | Freq: Every day | ORAL | Status: DC
  Filled 2012-05-16: qty 14

## 2012-05-16 MED ORDER — CITALOPRAM HYDROBROMIDE 20 MG PO TABS: 20.0000 mg | ORAL_TABLET | Freq: Every day | ORAL | Status: DC

## 2012-05-16 MED ORDER — TRAZODONE HCL 100 MG PO TABS
50.0000 mg | ORAL_TABLET | Freq: Every day | ORAL | Status: DC
  Filled 2012-05-16: qty 7

## 2012-05-16 NOTE — [facility] Suicide Risk Assessment (Signed)
Suicide Risk Assessment  Discharge Assessment      Demographic factors:  Unemployed  Current Mental Status:  Patient seen and evaluated. Chart reviewed. Patient stated that her mood was "great". Her affect was mood congruent and bright. She denied any current thoughts of self injurious behavior, suicidal ideation or homicidal ideation. There were no auditory or visual hallucinations, paranoia, delusional thought processes, or mania noted.  Thought process was linear and goal directed. Speech was normal rate, tone and volume. Eye contact was good. Judgment and insight are limited. No acute safety concerns reported from team. No w/d s/s reported.  No SEs from citalopram reported.  Sleep "good".  Loss Factors:  Financial problems / change in socioeconomic status  Historical Factors:  Family history of mental illness or substance abuse; denied hx SI/SIb; denied legal Hx; preg test neg at OSH; neg HIV  Risk Reduction Factors:  Responsible for children under 1 years of age;Sense of responsibility to family;Living with another person, especially a relative; GM  Discharge Diagnoses: Alcohol Use Disorder; HepC; SIMD; r/o PD NOS with Borderline Traits   Past Medical History  Diagnosis Date  . Depression   . Hepatitis     Cognitive Features That Contribute To Risk: none.  Suicide Risk: Pt viewed as a chronic moderate increased risk of harm to self in light of her past hx and risk factors.  No acute safety concerns on the unit.  Pt contracting for safety and is stable for d/c home with grandmother.  Plan Of Care/Follow-up recommendations: Pt seen and evaluated in treatment team. Chart reviewed.  Pt stable for and requesting discharge to home with grandmother.  Follow up with [facility], walk in Monday. Pt contracting for safety and does not currently meet [place] involuntary commitment criteria for continued hospitalization against her will.  Mental health treatment, medication management and continued sobriety  will mitigate against the potential increased risk of harm to self and/or others.  Discussed the importance of recovery further with pt, as well as, tools to move forward in a healthy & safe manner.  Pt agreeable with the plan.  Discussed with the team.  Please see orders, follow up appointments per AVS and full discharge summary. Recommend follow up with AA daily.  Diet: Regular.  Activity: As tolerated.     Kuroski-Mazzei, Dawn Foley 05/16/2012, 1:01 PM

## 2012-05-16 NOTE — Progress Notes (Signed)
Psychoeducational Group Note  Date:  05/16/2012 Time:  1100  Group Topic/Focus:  Relapse Prevention Planning:   The focus of this group is to define relapse and discuss the need for planning to combat relapse.  Participation Level:  Did Not Attend  Additional Comments:  Pt did not attend group.   Dalia Heading 05/16/2012, 1:00 PM

## 2012-05-16 NOTE — Progress Notes (Signed)
Pt is D/C home. Pt denies SI/HI/AV. Pt follow-up and medications were reviewed and pt verbalized understanding. Pt pleasant and cooperative. Pt belongings were returned.   

## 2012-05-16 NOTE — Treatment Plan (Signed)
Interdisciplinary Treatment Plan Update (Adult)  Date: 05/16/2012  Time Reviewed:12:29 PM  Progress in Treatment:  Attending groups: Yes  Participating in groups: Yes  Taking medication as prescribed: Yes  Tolerating medication: Yes  Family/Significant other contact made: Patient understands diagnosis: Yes  Discussing patient identified problems/goals with staff: Yes See below  Medical problems stabilized or resolved: Yes  Denies suicidal/homicidal ideation: Yes In tx team and self-inventory Issues/concerns per patient self-inventory: none Other:  New problem(s) identified: N/A  Reason for Continuation of Hospitalization:  D/C today Interventions implemented related to continuation of hospitalization: Additional comments:  Estimated length of stay: d/c today 9/6 Discharge Plan: O/P at [facility] and [facility] (walk-in) New goal(s): N/A  Review of initial/current patient goals per problem list:  1. Goal(s):Eliminate SI  Met: Yes  Target date: 9/6 As evidenced ZO:[id number] report in tx team and self-inventory 2. Goal (s): Stabilize mood  Met: Yes  Target date: 9/6 As evidenced by: rating depression and hopelessness of 1 on self-inventory 3. Goal(s): Identify comprehensive sobriety plan  Met: Yes Target date: 9/6 As evidenced by: O/P appts with [facility] and [facility]; attend daily AA meeting  4. Goal(s): Safely detox from alcohol:        Met: Yes       Target Date: 9/6       As evidenced by: Stable vitals; no withdrawal symptoms Attendees:    Patient: Dawn Foley 05/16/2012 12:29 PM    Family:     Physician: Lupe Carney  05/16/2012 12:29 PM    Nursing:  9/6/201312:29 PM   Case Manager: Richelle Ito, LCSW  05/16/2012 12:29 PM   Counselor: Ronda Fairly, LCSWA  9/6/201312:29 PM  .   Other: Trula Slade, MSW Intern 05/16/2012 12:30 PM    Other:     Other:     Other:     Scribe for Treatment Team: Trula Slade, MSW Intern, 9/6/201312:29 PM

## 2012-05-16 NOTE — Progress Notes (Signed)
[facility] Group Notes:  (Counselor/Nursing/MHT/Case Management/Adjunct)   Type of Therapy:  Group Therapy 1:15 to 2:30 PM Friday 05/16/2012   Participation Level:  Active  Participation Quality:  Attentive  Affect:  Appropriate  Cognitive:  Alert and Oriented  Insight:  Limited  Engagement in Group:  Good  Engagement in Therapy:  Limited  Modes of Intervention:  Clarification, Socialization and Support  Summary of Progress/Problems: Group session included an educational portion on Post Acute Withdrawal Syndrome (PAWS) and a processing portion on feelings about relapse and what, if anything, is the motive for recovery.  Patient questioned several points of educational portion and expressed desire to understand PAWS. Dawn Foley expressed confusion as to why she "should stop drinking"   Dawn Foley 05/16/2012, 4:16 PM
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
## 2012-05-16 NOTE — Progress Notes (Signed)
[facility] Group Notes:  (Counselor/Nursing/MHT/Case Management/Adjunct)  05/16/2012 12:36 PM  Type of Therapy:  Psychoeducational Skills  Participation Level:  Active  Participation Quality:  Appropriate, Attentive, Sharing and Supportive  Affect:  Appropriate and Excited bright, cheerful  Cognitive:  Alert and Appropriate  Insight:  Good  Engagement in Group:  Good  Engagement in Therapy:  Good  Modes of Intervention:  Activity, Education, Problem-solving and Socialization  Summary of Progress/Problems: Pt attended and participated in Electrical engineer where group played pictionary and then discussed the coping skills at the end of group.   Dalia Heading 05/16/2012, 12:36 PM

## 2012-05-16 NOTE — Progress Notes (Signed)
[facility] Case Management Discharge Plan:  Will you be returning to the same living situation after discharge: Yes,  grandma At discharge, do you have transportation home?:Yes,  grandma Do you have the ability to pay for your medications:Yes,  mental health  Interagency Information:     Release of information consent forms completed and in the chart;  Patient's signature needed at discharge.  Patient to Follow up at:  Follow-up Information    Follow up with [facility] on 05/19/2012. (Walk-in at [facility] )    Contact information:   [address] [place]. [place], [place] [address] [phone]      Follow up with [facility] (Walk-in Mon and Fri from 9-11am and Wed from 1-3pm)    Contact information:   [address] [place], [place] [address] [phone]          Patient denies SI/HI:   Yes,  yes    Safety Planning and Suicide Prevention discussed:  Yes,  yes  Barrier to discharge identified:No.  Summary and Recommendations:   Dawn Foley 05/16/2012, 11:14 AM

## 2012-05-19 NOTE — Progress Notes (Signed)
Patient Discharge Instructions:  After Visit Summary (AVS):   Faxed to:  05/19/2012 Psychiatric Admission Assessment Note:   Faxed to:  05/19/2012 Suicide Risk Assessment - Discharge Assessment:   Faxed to:  05/19/2012 Faxed/Sent to the Next Level Care provider:  05/19/2012  Faxed to [facility] @ [phone]  Heloise Purpura, Eduard Clos, 05/19/2012, 12:57 PM

## 2012-06-27 NOTE — Discharge Summary (Signed)
Physician Discharge Summary Note  Patient:  Dawn Foley is an 26 y.o., female MRN:  [id number] DOB:  01/11/1986 Patient phone:  [phone] (home)  Patient address:   [place] [address]  Date of Admission:  05/13/2012  Reason for Admission: see H&P. Principal Problem:  *Alcohol dependence  Level of Care:  Outpt upon discharge.  Hospital Course:  Pt admitted for crisis stabilization, detox and treatment. Pt attended all therapeutic groups, was active in her treatment planning process and agreed to her current medication regimen for further stability during recovery.  There were no acute issues during treatment and she was open to furthersubstance abuse Tx.  All labs were reviewed with her in great detail and medical needs were addressed.  No acute safety issues were noted on the unit. Medications were reviewed with pt and medication education was provided. Mental health treatment, medication management and continued sobriety will mitigate against any increased risk of harm to self and/or others.  Discussed the importance of recovery with pt, as well as, tools to move forward in a healthy & safe manner using the 12 Step Process.    Consults: none.  Significant Diagnostic Studies: see labs.   Discharge Vitals:   Blood pressure 82/52, pulse 91, temperature 97.4 F (36.3 C), temperature source Oral, resp. rate 14, last menstrual period 04/29/2012.  Physical Findings: AIMS: Facial and Oral Movements Muscles of Facial Expression: None, normal Lips and Perioral Area: None, normal Jaw: None, normal Tongue: None, normal,Extremity Movements Upper (arms, wrists, hands, fingers): None, normal Lower (legs, knees, ankles, toes): None, normal, Trunk Movements Neck, shoulders, hips: None, normal, Overall Severity Severity of abnormal movements (highest score from questions above): None, normal Incapacitation due to abnormal movements: None, normal Patient's awareness of  abnormal movements (rate only patient's report): No Awareness, Dental Status Current problems with teeth and/or dentures?: No Does patient usually wear dentures?: No  CIWA:  CIWA-Ar Total: 0  COWS:  COWS Total Score: 0   Mental Status Exam: See Mental Status Examination and Suicide Risk Assessment completed by Attending Physician prior to discharge.  Discharge destination: Home.  Is patient on multiple antipsychotic therapies at discharge: no.    Has Patient had three or more failed trials of antipsychotic monotherapy by history:  No. Recommended Plan for Multiple Antipsychotic Therapies: NA.    Medication List     As of 06/27/2012 11:20 AM    TAKE these medications      Indication    citalopram 20 MG tablet   Commonly known as: CELEXA   Take 1 tablet (20 mg total) by mouth daily. For depression/anxiety       traZODone 50 MG tablet   Commonly known as: DESYREL   Take 1 tablet (50 mg total) by mouth at bedtime. For sleep            Follow-up Information    Follow up with [facility] on 05/19/2012. (Walk-in at [facility] )    Contact information:   [address] [place]. [place], [place] [address] [phone]      Follow up with [facility]. (Walk-in Mon and Fri from 9-11am and Wed from 1-3pm)    Contact information:   [address] [place], [place] [address] [phone]         Discharge Diagnoses: Alcohol Use Disorder; HepC; SIMD; r/o PD NOS with Borderline Traits   Past Medical History   Diagnosis  Date   .  Depression    .  Hepatitis     Plan Of Care/Follow-up recommendations: Pt seen and evaluated in treatment team. Chart reviewed. Pt stable for and requesting discharge to home with grandmother. Follow up with [facility], walk in Monday. Pt contracting for safety and does not currently meet [place] involuntary commitment criteria for continued hospitalization against her will. Mental health treatment, medication management and continued sobriety will mitigate against the potential  increased risk of harm to self and/or others. Discussed the importance of recovery further with pt, as well as, tools to move forward in a healthy & safe manner. Pt agreeable with the plan. Discussed with the team. Recommend follow up with AA daily. Diet: Regular. Activity: As tolerated.  Signed: Lupe Carney 06/27/2012, 11:20 AM

## 2013-02-10 ENCOUNTER — Emergency Department (HOSPITAL_COMMUNITY)
Admission: EM | Admit: 2013-02-10 | Discharge: 2013-02-11 | Disposition: A | Discharge status: Home / Self Care | Payer: Self-pay | Attending: Emergency Medicine | Admitting: Emergency Medicine

## 2013-02-10 ENCOUNTER — Encounter (HOSPITAL_COMMUNITY): Payer: Self-pay | Admitting: *Deleted

## 2013-02-10 DIAGNOSIS — F1020 Alcohol dependence, uncomplicated: Secondary | ICD-10-CM | POA: Insufficient documentation

## 2013-02-10 DIAGNOSIS — F329 Major depressive disorder, single episode, unspecified: Secondary | ICD-10-CM | POA: Insufficient documentation

## 2013-02-10 DIAGNOSIS — R259 Unspecified abnormal involuntary movements: Secondary | ICD-10-CM | POA: Insufficient documentation

## 2013-02-10 DIAGNOSIS — F411 Generalized anxiety disorder: Secondary | ICD-10-CM | POA: Insufficient documentation

## 2013-02-10 DIAGNOSIS — R319 Hematuria, unspecified: Secondary | ICD-10-CM | POA: Insufficient documentation

## 2013-02-10 DIAGNOSIS — IMO0002 Reserved for concepts with insufficient information to code with codable children: Secondary | ICD-10-CM | POA: Insufficient documentation

## 2013-02-10 DIAGNOSIS — Z8719 Personal history of other diseases of the digestive system: Secondary | ICD-10-CM | POA: Insufficient documentation

## 2013-02-10 DIAGNOSIS — F3289 Other specified depressive episodes: Secondary | ICD-10-CM | POA: Insufficient documentation

## 2013-02-10 DIAGNOSIS — Z3202 Encounter for pregnancy test, result negative: Secondary | ICD-10-CM | POA: Insufficient documentation

## 2013-02-10 DIAGNOSIS — F39 Unspecified mood [affective] disorder: Secondary | ICD-10-CM | POA: Insufficient documentation

## 2013-02-10 DIAGNOSIS — F17200 Nicotine dependence, unspecified, uncomplicated: Secondary | ICD-10-CM | POA: Insufficient documentation

## 2013-02-10 DIAGNOSIS — Z8619 Personal history of other infectious and parasitic diseases: Secondary | ICD-10-CM | POA: Insufficient documentation

## 2013-02-10 DIAGNOSIS — R1011 Right upper quadrant pain: Secondary | ICD-10-CM | POA: Insufficient documentation

## 2013-02-10 HISTORY — DX: Alcohol dependence, uncomplicated: F10.20

## 2013-02-10 LAB — CBC WITH DIFFERENTIAL/PLATELET
Basophils Absolute: 0 10*3/uL (ref 0.0–0.1)
HCT: 40.1 % (ref 36.0–46.0)
Hemoglobin: 13.8 g/dL (ref 12.0–15.0)
Lymphocytes Relative: 54 % — ABNORMAL HIGH (ref 12–46)
Monocytes Absolute: 0.5 10*3/uL (ref 0.1–1.0)
Monocytes Relative: 9 % (ref 3–12)
Neutro Abs: 2.2 10*3/uL (ref 1.7–7.7)
Neutrophils Relative %: 35 % — ABNORMAL LOW (ref 43–77)
WBC: 6.3 10*3/uL (ref 4.0–10.5)

## 2013-02-10 LAB — COMPREHENSIVE METABOLIC PANEL
AST: 72 U/L — ABNORMAL HIGH (ref 0–37)
Alkaline Phosphatase: 63 U/L (ref 39–117)
BUN: 5 mg/dL — ABNORMAL LOW (ref 6–23)
CO2: 21 mEq/L (ref 19–32)
Chloride: 105 mEq/L (ref 96–112)
Creatinine, Ser: 0.52 mg/dL (ref 0.50–1.10)
GFR calc non Af Amer: >90 mL/min (ref >90)
Potassium: 3.9 mEq/L (ref 3.5–5.1)
Total Bilirubin: 0.3 mg/dL (ref 0.3–1.2)

## 2013-02-10 LAB — URINE MICROSCOPIC-ADD ON

## 2013-02-10 LAB — URINALYSIS, ROUTINE W REFLEX MICROSCOPIC
Ketones, ur: NEGATIVE mg/dL
Leukocytes, UA: NEGATIVE
Nitrite: NEGATIVE
Protein, ur: NEGATIVE mg/dL
Urobilinogen, UA: 0.2 mg/dL (ref 0.0–1.0)

## 2013-02-10 LAB — PREGNANCY, URINE: Preg Test, Ur: NEGATIVE

## 2013-02-10 MED ORDER — ADULT MULTIVITAMIN W/MINERALS CH
1.0000 | ORAL_TABLET | Freq: Every day | ORAL | Status: DC
  Administered 2013-02-11: 1 via ORAL
  Filled 2013-02-10: qty 1

## 2013-02-10 MED ORDER — THIAMINE HCL 100 MG/ML IJ SOLN: 100.0000 mg | Freq: Every day | INTRAMUSCULAR | Status: DC

## 2013-02-10 MED ORDER — NICOTINE 14 MG/24HR TD PT24
14.0000 mg | MEDICATED_PATCH | Freq: Once | TRANSDERMAL | Status: DC
  Administered 2013-02-10: 14 mg via TRANSDERMAL
  Filled 2013-02-10: qty 1

## 2013-02-10 MED ORDER — VITAMIN B-1 100 MG PO TABS
100.0000 mg | ORAL_TABLET | Freq: Every day | ORAL | Status: DC
  Administered 2013-02-11: 100 mg via ORAL
  Filled 2013-02-10: qty 1

## 2013-02-10 MED ORDER — LORAZEPAM 1 MG PO TABS
1.0000 mg | ORAL_TABLET | Freq: Four times a day (QID) | ORAL | Status: DC | PRN
  Administered 2013-02-11 (×3): 1 mg via ORAL
  Filled 2013-02-10 (×4): qty 1

## 2013-02-10 MED ORDER — FOLIC ACID 1 MG PO TABS
1.0000 mg | ORAL_TABLET | Freq: Every day | ORAL | Status: DC
  Administered 2013-02-11: 1 mg via ORAL
  Filled 2013-02-10: qty 1

## 2013-02-10 MED ORDER — LORAZEPAM 1 MG PO TABS
1.0000 mg | ORAL_TABLET | Freq: Once | ORAL | Status: AC
  Administered 2013-02-10: 1 mg via ORAL
  Filled 2013-02-10: qty 2

## 2013-02-10 MED ORDER — LORAZEPAM 2 MG/ML IJ SOLN: 1.0000 mg | Freq: Four times a day (QID) | INTRAMUSCULAR | Status: DC | PRN

## 2013-02-10 NOTE — ED Notes (Addendum)
Pt left room, told its important for her to stay in the room so she can be monitored. Pt continued to leave. PA notified.

## 2013-02-10 NOTE — ED Notes (Signed)
Pt reports she is here for ETOH detox so she can be with her children and husband. Pt sts she has tried to detox 2 weeks ago, went to [facility] but went home and not to a facility afterwards. Pt wishes to be placed at [facility]. Pt denies SI/HI. Pt reports she drinks every day, "as much as she can get" at least 14+ beers a day. Pt reports the last drink she had was about 1.5 hrs ago. Pt denies illegal drug use. Pt reports she does have cirrhosis of the liver X 2 years and hepatitis C X 1 year. Pt in nad, skin warm and dry, resp e/u.

## 2013-02-10 NOTE — ED Notes (Signed)
Called for FT x2 no answer.

## 2013-02-10 NOTE — ED Notes (Signed)
The pt wants to be detoxed from alcohol her last alcohol was 10 minutes ago.   She denies drug use

## 2013-02-10 NOTE — ED Provider Notes (Signed)
History  This chart was scribed for non-physician practitioner Arthor Captain, PA-C, working with Flint Melter, MD, by Yevette Edwards, ED Scribe. This patient was seen in room TR08C/TR08C and the patient's care was started at 10:42 PM   CSN: [id number]  Arrival date & time 02/10/13  1910   First MD Initiated Contact with Patient 02/10/13 2224      Chief Complaint  Patient presents with  . detox from alcohol     The history is provided by the patient and the spouse. No language interpreter was used.   HPI Comments: Dawn Foley is a 27 y.o. female, with a h/o of liver cirrhosis and hepatitis, who presents to the Emergency Department due to alcohol withdrawal and detoxi and for placement in a treatment facility. Pt states that she has the shakes upon waking up, and she begins drinking after waking. She reports that she has been drinking intermittently since she was 27 years old, but she has been drinking heavily for the past four years. She reports that last year, she experienced six months of sobriety after inpatient treatment. Her husbands states that today she has had thirteen 12 oz and one 40 oz.  The pt states that she drinks more than people know.  The pt states that she is shaky, though that has been reduced with Lorazepam. She states that she has experienced depression. She SI/HI/AVH.  She denies using any other drugs. She reports that she had a seizure about eight months ago due to alcohol withdrawal, and she was hospitalized for four days.  Yesterday, she visited a urologist in order to have a catheter removed due to an episode of acute urinary retentionfor which she was seen at [facility] ED  and she was told that she has liver cancer. She states that she experience hematuria after the cath, but that has resolved.  No other urinary symptoms and urinating normally  Past Medical History  Diagnosis Date  . Depression   . Hepatitis   . Alcoholism     Past Surgical History  Procedure  Laterality Date  . No past surgeries      No family history on file.  History  Substance Use Topics  . Smoking status: Current Every Day Smoker -- 1.00 packs/day for 16 years    Types: Cigarettes  . Smokeless tobacco: Not on file  . Alcohol Use: 43.2 oz/week    72 Cans of beer per week     Comment: 3 cases daily    No ob history provided.   Review of Systems  Constitutional: Negative for fever and chills.  HENT: Negative for trouble swallowing.   Respiratory: Negative for shortness of breath.   Cardiovascular: Negative for chest pain.  Gastrointestinal: Positive for abdominal pain. Negative for nausea, vomiting, diarrhea and constipation.  Genitourinary: Positive for hematuria. Negative for dysuria.  Musculoskeletal: Negative for myalgias and arthralgias.  Skin: Negative for rash.  Neurological: Positive for tremors. Negative for weakness and numbness.  Psychiatric/Behavioral: Positive for dysphoric mood and agitation. Negative for suicidal ideas, hallucinations, confusion, sleep disturbance and self-injury. The patient is nervous/anxious.        Hystrionic, emotionally labile  All other systems reviewed and are negative.   Allergies  No allergies on file  Home Medications  No current outpatient prescriptions on file.  Triage Vitals: BP 130/85  Pulse 130  Temp(Src) 98.1 F (36.7 C) (Oral)  Resp 22  SpO2 97%  LMP 01/10/2013  Physical Exam  Nursing note and  vitals reviewed. Constitutional: She is oriented to person, place, and time. She appears well-developed and well-nourished.  HENT:  Head: Normocephalic and atraumatic.  Eyes: EOM are normal.  Neck: Neck supple. No tracheal deviation present.  Cardiovascular: Normal rate.   Pulmonary/Chest: Effort normal. No respiratory distress.  Abdominal: There is tenderness.  Tender to palpation RUQ.   Musculoskeletal: Normal range of motion.  Neurological: She is alert and oriented to person, place, and time.  She has  a facial tic  Skin: Skin is warm and dry. She is not diaphoretic.  Psychiatric:  Anxious.    ED Course  Procedures (including critical care time)  DIAGNOSTIC STUDIES: Oxygen Saturation is 97% on room air, normal by my interpretation.    COORDINATION OF CARE:  10:50 PM-Informed pt that I will consult and that she will be visited by ACT. Pt agreed.   Labs Reviewed  URINALYSIS, ROUTINE W REFLEX MICROSCOPIC - Abnormal; Notable for the following:    Hgb urine dipstick SMALL (*)    All other components within normal limits  CBC WITH DIFFERENTIAL - Abnormal; Notable for the following:    Neutrophils Relative % 35 (*)    Lymphocytes Relative 54 (*)    All other components within normal limits  COMPREHENSIVE METABOLIC PANEL - Abnormal; Notable for the following:    BUN 5 (*)    Total Protein 8.8 (*)    AST 72 (*)    ALT 67 (*)    All other components within normal limits  ETHANOL - Abnormal; Notable for the following:    Alcohol, Ethyl (B) 348 (*)    All other components within normal limits  URINE RAPID DRUG SCREEN (HOSP PERFORMED)  PREGNANCY, URINE  URINE MICROSCOPIC-ADD ON   No results found.   No diagnosis found.    MDM  Patient with reported cirrhosis and new diagnosis of liver cancer yesterday. Patient has heavy alcohol dependence and is requesting inpatient dependence. Although her blood ethanol is 348 she is only minimally symptomatic ( emotionally labile, crying) but is otherwise clinically sober, sugessting even further how extensiveness of her disease. The patient has been placed on CIWA protocol and will be sent  Evaluated when Blood alcohol below 200. I have given report to Dr. Silverio Lay who is aware of the patient .       I personally performed the services described in this documentation, which was scribed in my presence. The recorded information has been reviewed and is accurate.     Arthor Captain, PA-C 02/11/13 1020

## 2013-02-11 LAB — ETHANOL: Alcohol, Ethyl (B): <11 mg/dL (ref 0–11)

## 2013-02-11 MED ORDER — NICOTINE 21 MG/24HR TD PT24: 21.0000 mg | MEDICATED_PATCH | Freq: Every day | TRANSDERMAL | Status: DC

## 2013-02-11 MED ORDER — ALUM & MAG HYDROXIDE-SIMETH 200-200-20 MG/5ML PO SUSP
30.0000 mL | ORAL | Status: DC | PRN
  Administered 2013-02-11: 30 mL via ORAL
  Filled 2013-02-11: qty 30

## 2013-02-11 MED ORDER — LORAZEPAM 1 MG PO TABS
1.0000 mg | ORAL_TABLET | Freq: Once | ORAL | Status: AC
  Administered 2013-02-11: 1 mg via ORAL

## 2013-02-11 MED ORDER — ONDANSETRON HCL 4 MG PO TABS
4.0000 mg | ORAL_TABLET | Freq: Three times a day (TID) | ORAL | Status: DC | PRN
  Administered 2013-02-11: 4 mg via ORAL
  Filled 2013-02-11: qty 1

## 2013-02-11 MED ORDER — IBUPROFEN 400 MG PO TABS
600.0000 mg | ORAL_TABLET | Freq: Three times a day (TID) | ORAL | Status: DC | PRN
  Administered 2013-02-11: 600 mg via ORAL
  Filled 2013-02-11: qty 1

## 2013-02-11 MED ORDER — ZOLPIDEM TARTRATE 5 MG PO TABS: 5.0000 mg | ORAL_TABLET | Freq: Every evening | ORAL | Status: DC | PRN

## 2013-02-11 NOTE — ED Provider Notes (Signed)
Medical screening examination/treatment/procedure(s) were performed by non-physician practitioner and as supervising physician I was immediately available for consultation/collaboration.  Flint Melter, MD 02/11/13 [phone]

## 2013-02-11 NOTE — ED Notes (Signed)
Patient received meal tray 

## 2013-02-11 NOTE — ED Notes (Signed)
Patient very upset after talking with ACT team.  Patient states she has court and can't miss.   Patient extremely anxious when talking about it.

## 2013-02-11 NOTE — ED Notes (Signed)
Security wanded patient.   Patient's belongings placed in a belongings bag and taken to nurses station for inventory.

## 2013-02-11 NOTE — BH Assessment (Signed)
[facility] Assessment Progress Note      Per Tion[facility] has available beds.  Referral faxed.

## 2013-02-11 NOTE — BH Assessment (Signed)
Assessment Note   Dawn Foley is an 27 y.o. female who presents seeking detox from alcohol.  She reports drinking since age 13 and drinks approximately 2 cases of beer daily.  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  She denies SI (thought, plan, and intent) now or in the last six months, and HI now or in the last six months.  She also denies AVH.  She is calm and cooperative and motivated for treatment.    Axis I: Substance Induced Mood Disorder and Alcohol Dependence Axis II: Deferred Axis III:  Past Medical History  Diagnosis Date  . Depression   . Hepatitis   . Alcoholism    Axis IV: problems with access to health care services and problems with primary support group Axis V: 41-50 serious symptoms  Past Medical History:  Past Medical History  Diagnosis Date  . Depression   . Hepatitis   . Alcoholism     Past Surgical History  Procedure Laterality Date  . No past surgeries      Family History: No family history on file.  Social History:  reports that she has been smoking Cigarettes.  She has a 16 pack-year smoking history. She does not have any smokeless tobacco history on file. She reports that she drinks about 43.2 ounces of alcohol per week. She reports that she does not use illicit drugs.  Additional Social History:  Alcohol / Drug Use History of alcohol / drug use?: Yes Substance #1 Name of Substance 1: Beer and Liquor 1 - Age of First Use: 21 1 - Amount (size/oz): 2 cases 1 - Frequency: daily 1 - Duration: 4 years 1 - Last Use / Amount: 02/10/13 13 beers  CIWA: CIWA-Ar BP: 105/64 mmHg Pulse Rate: 94 Nausea and Vomiting: intermittent nausea with dry heaves Tactile Disturbances: none Tremor: moderate, with patient's arms extended Auditory Disturbances: very mild harshness or ability to frighten Paroxysmal Sweats: no sweat visible Visual Disturbances: not  present Anxiety: moderately anxious, or guarded, so anxiety is inferred Headache, Fullness in Head: none present Agitation: normal activity Orientation and Clouding of Sensorium: oriented and can do serial additions CIWA-Ar Total: 13 COWS:    Allergies:  Allergies  Allergen Reactions  . No Allergies On File     Home Medications:  (Not in a hospital admission)  OB/GYN Status:  Patient's last menstrual period was 01/10/2013.  General Assessment Data Location of Assessment: [facility] ED Living Arrangements: Spouse/significant other;Children (5,4) Can pt return to current living arrangement?: Yes Admission Status: Voluntary Is patient capable of signing voluntary admission?: Yes Transfer from: Acute Hospital Referral Source: Self/Family/Friend  Education Status Is patient currently in school?: No Highest grade of school patient has completed: some college  Risk to self Suicidal Ideation: No Suicidal Intent: No Is patient at risk for suicide?: No Suicidal Plan?: No Access to Means: No What has been your use of drugs/alcohol within the last 12 months?: drinking heavily Previous Attempts/Gestures: No Intentional Self Injurious Behavior: None Family Suicide History: No Persecutory voices/beliefs?: No Depression: Yes Depression Symptoms: Feeling worthless/self pity;Guilt;Feeling angry/irritable;Insomnia;Tearfulness;Isolating;Loss of interest in usual pleasures Substance abuse history and/or treatment for substance abuse?: Yes Suicide prevention information given to non-admitted patients: Yes  Risk to Others Homicidal Ideation: No Thoughts of Harm to Others: No Current Homicidal Intent: No Current Homicidal Plan: No Access to Homicidal Means: No History of harm  to others?: No Assessment of Violence: None Noted Does patient have access to weapons?: Yes (Comment) (firearm) Criminal Charges Pending?: Yes Describe Pending Criminal Charges: trespassing Does patient have a court  date: Yes Court Date: 02/18/13  Psychosis Hallucinations: None noted Delusions: None noted  Mental Status Report Appear/Hygiene: Disheveled Eye Contact: Poor Motor Activity: Freedom of movement Speech: Soft Level of Consciousness: Quiet/awake Mood: Anxious Affect: Appropriate to circumstance Anxiety Level: Panic Attacks Panic attack frequency: daily Most recent panic attack: this morning Thought Processes: Coherent;Relevant Judgement: Unimpaired Orientation: Place;Person;Time;Situation Obsessive Compulsive Thoughts/Behaviors: Minimal  Cognitive Functioning Concentration: Decreased Memory: Recent Intact;Remote Intact IQ: Average Insight: Fair Impulse Control: Poor Appetite: Good Sleep: Decreased Total Hours of Sleep: 2 Vegetative Symptoms: None  ADLScreening [facility] Assessment Services) Patient's cognitive ability adequate to safely complete daily activities?: Yes Patient able to express need for assistance with ADLs?: Yes Independently performs ADLs?: Yes (appropriate for developmental age)  Abuse/Neglect [facility]) Physical Abuse: Denies Verbal Abuse: Denies Sexual Abuse: Denies  Prior Inpatient Therapy Prior Inpatient Therapy: Yes Prior Therapy Dates: May 2012 Prior Therapy Facilty/Provider(s): [facility] Reason for Treatment: Detox  Prior Outpatient Therapy Prior Outpatient Therapy: No  ADL Screening (condition at time of admission) Patient's cognitive ability adequate to safely complete daily activities?: Yes Patient able to express need for assistance with ADLs?: Yes Independently performs ADLs?: Yes (appropriate for developmental age)       Abuse/Neglect Assessment (Assessment to be complete while patient is alone) Physical Abuse: Denies Verbal Abuse: Denies Sexual Abuse: Denies Exploitation of patient/patient's resources: Denies Values / Beliefs Cultural Requests During Hospitalization: None Spiritual Requests During Hospitalization: None    Advance Directives (For Healthcare) Advance Directive: Patient does not have advance directive;Patient would not like information Nutrition Screen- [facility] Adult/WL/AP Patient's home diet: Regular Have you recently lost weight without trying?: No  Additional Information 1:1 In Past 12 Months?: No CIRT Risk: No Elopement Risk: No Does patient have medical clearance?: Yes     Disposition:  Disposition Initial Assessment Completed for this Encounter: Yes Disposition of Patient: Inpatient treatment program Type of inpatient treatment program: Adult  On Site Evaluation by:   Reviewed with Physician:     Steward Ros 02/11/2013 7:07 AM

## 2013-02-11 NOTE — ED Notes (Signed)
Pt placed in paper scrubs.

## 2013-02-11 NOTE — ED Provider Notes (Signed)
Pt given ativan for increased withdrawal sx--likely to placed today  Toy Baker, MD 02/11/13 1501

## 2013-02-11 NOTE — ED Notes (Signed)
Patient's husband called.   Patient didn't want to talk at this time, but advised she would callback.  Husband advised that she would callback.

## 2013-03-12 ENCOUNTER — Encounter (HOSPITAL_COMMUNITY): Payer: Self-pay | Admitting: *Deleted

## 2013-03-12 ENCOUNTER — Emergency Department (HOSPITAL_COMMUNITY)
Admission: EM | Admit: 2013-03-12 | Discharge: 2013-03-13 | Disposition: A | Discharge status: Other Acute Inpatient Hospital | Payer: Medicaid Other | Attending: Emergency Medicine | Admitting: Emergency Medicine

## 2013-03-12 DIAGNOSIS — Z8669 Personal history of other diseases of the nervous system and sense organs: Secondary | ICD-10-CM | POA: Insufficient documentation

## 2013-03-12 DIAGNOSIS — Z8619 Personal history of other infectious and parasitic diseases: Secondary | ICD-10-CM | POA: Insufficient documentation

## 2013-03-12 DIAGNOSIS — R7402 Elevation of levels of lactic acid dehydrogenase (LDH): Secondary | ICD-10-CM | POA: Insufficient documentation

## 2013-03-12 DIAGNOSIS — Z3202 Encounter for pregnancy test, result negative: Secondary | ICD-10-CM | POA: Insufficient documentation

## 2013-03-12 DIAGNOSIS — F17200 Nicotine dependence, unspecified, uncomplicated: Secondary | ICD-10-CM | POA: Insufficient documentation

## 2013-03-12 DIAGNOSIS — F3289 Other specified depressive episodes: Secondary | ICD-10-CM | POA: Insufficient documentation

## 2013-03-12 DIAGNOSIS — F10220 Alcohol dependence with intoxication, uncomplicated: Secondary | ICD-10-CM

## 2013-03-12 DIAGNOSIS — F329 Major depressive disorder, single episode, unspecified: Secondary | ICD-10-CM | POA: Insufficient documentation

## 2013-03-12 DIAGNOSIS — R7401 Elevation of levels of liver transaminase levels: Secondary | ICD-10-CM | POA: Insufficient documentation

## 2013-03-12 DIAGNOSIS — F10229 Alcohol dependence with intoxication, unspecified: Secondary | ICD-10-CM | POA: Insufficient documentation

## 2013-03-12 LAB — RAPID URINE DRUG SCREEN, HOSP PERFORMED
Amphetamines: NOT DETECTED
Tetrahydrocannabinol: NOT DETECTED

## 2013-03-12 LAB — CBC
HCT: 42.5 % (ref 36.0–46.0)
Platelets: 93 10*3/uL — ABNORMAL LOW (ref 150–400)
RDW: 15.7 % — ABNORMAL HIGH (ref 11.5–15.5)
WBC: 5 10*3/uL (ref 4.0–10.5)

## 2013-03-12 LAB — COMPREHENSIVE METABOLIC PANEL
AST: 291 U/L — ABNORMAL HIGH (ref 0–37)
Albumin: 4.1 g/dL (ref 3.5–5.2)
Alkaline Phosphatase: 88 U/L (ref 39–117)
BUN: 7 mg/dL (ref 6–23)
Chloride: 104 mEq/L (ref 96–112)
Potassium: 4.1 mEq/L (ref 3.5–5.1)
Total Bilirubin: 0.4 mg/dL (ref 0.3–1.2)

## 2013-03-12 LAB — POCT PREGNANCY, URINE: Preg Test, Ur: NEGATIVE

## 2013-03-12 MED ORDER — FOLIC ACID 1 MG PO TABS
1.0000 mg | ORAL_TABLET | Freq: Every day | ORAL | Status: DC
  Administered 2013-03-12 – 2013-03-13 (×2): 1 mg via ORAL
  Filled 2013-03-12 (×2): qty 1

## 2013-03-12 MED ORDER — ACETAMINOPHEN 325 MG PO TABS: 650.0000 mg | ORAL_TABLET | ORAL | Status: DC | PRN

## 2013-03-12 MED ORDER — LORAZEPAM 2 MG/ML IJ SOLN: 1.0000 mg | Freq: Four times a day (QID) | INTRAMUSCULAR | Status: DC | PRN

## 2013-03-12 MED ORDER — IBUPROFEN 400 MG PO TABS: 600.0000 mg | ORAL_TABLET | Freq: Three times a day (TID) | ORAL | Status: DC | PRN

## 2013-03-12 MED ORDER — ONDANSETRON HCL 4 MG PO TABS
4.0000 mg | ORAL_TABLET | Freq: Three times a day (TID) | ORAL | Status: DC | PRN
  Administered 2013-03-13: 4 mg via ORAL
  Filled 2013-03-12: qty 1

## 2013-03-12 MED ORDER — ADULT MULTIVITAMIN W/MINERALS CH
1.0000 | ORAL_TABLET | Freq: Every day | ORAL | Status: DC
  Administered 2013-03-12: 1 via ORAL
  Filled 2013-03-12: qty 1

## 2013-03-12 MED ORDER — LORAZEPAM 1 MG PO TABS
0.0000 mg | ORAL_TABLET | Freq: Two times a day (BID) | ORAL | Status: DC
Start: 2013-03-14 — End: 2013-03-13

## 2013-03-12 MED ORDER — THIAMINE HCL 100 MG/ML IJ SOLN: 100.0000 mg | Freq: Every day | INTRAMUSCULAR | Status: DC

## 2013-03-12 MED ORDER — LORAZEPAM 1 MG PO TABS
1.0000 mg | ORAL_TABLET | Freq: Four times a day (QID) | ORAL | Status: DC | PRN
  Administered 2013-03-12 (×2): 1 mg via ORAL
  Filled 2013-03-12 (×3): qty 1

## 2013-03-12 MED ORDER — ZOLPIDEM TARTRATE 5 MG PO TABS
5.0000 mg | ORAL_TABLET | Freq: Every evening | ORAL | Status: DC | PRN
  Administered 2013-03-12: 5 mg via ORAL
  Filled 2013-03-12: qty 1

## 2013-03-12 MED ORDER — VITAMIN B-1 100 MG PO TABS
100.0000 mg | ORAL_TABLET | Freq: Every day | ORAL | Status: DC
  Administered 2013-03-12 – 2013-03-13 (×2): 100 mg via ORAL
  Filled 2013-03-12 (×2): qty 1

## 2013-03-12 MED ORDER — ALUM & MAG HYDROXIDE-SIMETH 200-200-20 MG/5ML PO SUSP: 30.0000 mL | ORAL | Status: DC | PRN

## 2013-03-12 MED ORDER — LORAZEPAM 1 MG PO TABS
0.0000 mg | ORAL_TABLET | Freq: Four times a day (QID) | ORAL | Status: DC
  Administered 2013-03-12 – 2013-03-13 (×2): 1 mg via ORAL
  Administered 2013-03-13: 2 mg via ORAL
  Filled 2013-03-12: qty 2
  Filled 2013-03-12: qty 1

## 2013-03-12 MED ORDER — NICOTINE 14 MG/24HR TD PT24
14.0000 mg | MEDICATED_PATCH | Freq: Every day | TRANSDERMAL | Status: DC
  Administered 2013-03-12: 14 mg via TRANSDERMAL
  Filled 2013-03-12 (×2): qty 1

## 2013-03-12 NOTE — ED Notes (Signed)
Pt reports that she is detoxing from etoh, last drank 30-45 mins ago. Denies any SI or HI.

## 2013-03-12 NOTE — ED Notes (Signed)
States drank around 6 beers today. States drinks around the clock. States has been drinking since age 27. States last time she was sober was 2010 for about 2 months when she was pregnant. Has pending court cases (superior court). Patient smells strongly of alcohol. States she is anxious and doesn't have enough alcohol in her system. Pt states she has cirrosis and doesn't want to die. Patient difficult to stay on topic. Very fixated on getting something to " calm down"

## 2013-03-12 NOTE — BH Assessment (Signed)
[facility] Assessment Progress Note      Pt is too intoxicated to participate in assessment at this time.  Informed EDP Preston Fleeting.  ACT will assess pt once she is more coherent and able to answer assessment questions.

## 2013-03-12 NOTE — ED Provider Notes (Signed)
History    CSN: [id number] Arrival date & time 03/12/13  1314  First MD Initiated Contact with Patient 03/12/13 1448     Chief Complaint  Patient presents with  . Medical Clearance   (Consider location/radiation/quality/duration/timing/severity/associated sxs/prior Treatment) The history is provided by the patient.  27 year old female comes in requesting help with detox from alcohol. She admits to drinking a case of beer a day, last drink was this morning. She claims a history of DT's and alcohol withdraw seizures. Seh admits to depression, but denies SI and HI. She denies hallucinations. She is demanding a dose of Ativan immediately. Past Medical History  Diagnosis Date  . Depression   . Hepatitis   . Alcoholism    Past Surgical History  Procedure Laterality Date  . No past surgeries     History reviewed. No pertinent family history. History  Substance Use Topics  . Smoking status: Current Every Day Smoker -- 1.00 packs/day for 16 years    Types: Cigarettes  . Smokeless tobacco: Not on file  . Alcohol Use: 43.2 oz/week    72 Cans of beer per week     Comment: 3 cases daily   OB History   Grav Para Term Preterm Abortions TAB SAB Ect Mult Living                 Review of Systems  All other systems reviewed and are negative.    Allergies  No allergies on file  Home Medications  No current outpatient prescriptions on file. BP 134/95  Pulse 109  Temp(Src) 98 F (36.7 C) (Oral)  Resp 20  SpO2 97%  LMP 01/10/2013 Physical Exam  Nursing note and vitals reviewed.  28 year old female, resting comfortably and in no acute distress. Vital signs are significant for hypertension, with BP 134/95, and tachycardia with heart rate of 109. Oxygen saturation is 97%, which is normal. Head is normocephalic and atraumatic. PERRLA, EOMI. Oropharynx is clear. Neck is nontender and supple without adenopathy or JVD. Back is nontender and there is no CVA tenderness. Lungs are  clear without rales, wheezes, or rhonchi. Chest is nontender. Heart has regular rate and rhythm without murmur. Abdomen is soft, flat, nontender without masses or hepatosplenomegaly and peristalsis is normoactive. Extremities have no cyanosis or edema, full range of motion is present. Skin is warm and dry without rash. Neurologic: Mental status is normal, cranial nerves are intact, there are no motor or sensory deficits. No tremor present. Psychiatric: She is agitated and demanding. Emotional lability is present.  ED Course  Procedures (including critical care time) Results for orders placed during the hospital encounter of 03/12/13  CBC      Result Value Range   WBC 5.0  4.0 - 10.5 K/uL   RBC 5.09  3.87 - 5.11 MIL/uL   Hemoglobin 14.5  12.0 - 15.0 g/dL   HCT 62.1  30.8 - 65.7 %   MCV 83.5  78.0 - 100.0 fL   MCH 28.5  26.0 - 34.0 pg   MCHC 34.1  30.0 - 36.0 g/dL   RDW 84.6 (*) 96.2 - 95.2 %   Platelets 93 (*) 150 - 400 K/uL  COMPREHENSIVE METABOLIC PANEL      Result Value Range   Sodium 140  135 - 145 mEq/L   Potassium 4.1  3.5 - 5.1 mEq/L   Chloride 104  96 - 112 mEq/L   CO2 22  19 - 32 mEq/L   Glucose,  Bld 92  70 - 99 mg/dL   BUN 7  6 - 23 mg/dL   Creatinine, Ser 8.11 (*) 0.50 - 1.10 mg/dL   Calcium 8.9  8.4 - 91.4 mg/dL   Total Protein 9.0 (*) 6.0 - 8.3 g/dL   Albumin 4.1  3.5 - 5.2 g/dL   AST 782 (*) 0 - 37 U/L   ALT 301 (*) 0 - 35 U/L   Alkaline Phosphatase 88  39 - 117 U/L   Total Bilirubin 0.4  0.3 - 1.2 mg/dL   GFR calc non Af Amer >90  >90 mL/min   GFR calc Af Amer >90  >90 mL/min  ETHANOL      Result Value Range   Alcohol, Ethyl (B) 413 (*) 0 - 11 mg/dL  URINE RAPID DRUG SCREEN (HOSP PERFORMED)      Result Value Range   Opiates NONE DETECTED  NONE DETECTED   Cocaine NONE DETECTED  NONE DETECTED   Benzodiazepines NONE DETECTED  NONE DETECTED   Amphetamines NONE DETECTED  NONE DETECTED   Tetrahydrocannabinol NONE DETECTED  NONE DETECTED   Barbiturates NONE  DETECTED  NONE DETECTED  POCT PREGNANCY, URINE      Result Value Range   Preg Test, Ur NEGATIVE  NEGATIVE    1. Alcohol intoxication in active alcoholic, uncomplicated   2. Elevated transaminase level     MDM  Alcohol intoxication and dependence. Old records were reviewed, and she was seen here 02/10/2013 and referred to [facility]. She will be started on alcohol withdraw protocol. Note is made of significant worsening of LFT's compared to one month ago.  Dione Booze, MD 03/13/13 [phone]

## 2013-03-13 ENCOUNTER — Encounter (HOSPITAL_COMMUNITY): Payer: Self-pay | Admitting: *Deleted

## 2013-03-13 ENCOUNTER — Inpatient Hospital Stay (HOSPITAL_COMMUNITY)
Admission: AD | Admit: 2013-03-13 | Discharge: 2013-03-16 | DRG: 751 | Disposition: A | Discharge status: Home / Self Care | Payer: Medicaid Other | Source: Intra-hospital | Attending: Psychiatry | Admitting: Psychiatry

## 2013-03-13 DIAGNOSIS — F1020 Alcohol dependence, uncomplicated: Principal | ICD-10-CM | POA: Diagnosis present

## 2013-03-13 DIAGNOSIS — F411 Generalized anxiety disorder: Secondary | ICD-10-CM | POA: Diagnosis present

## 2013-03-13 DIAGNOSIS — F17200 Nicotine dependence, unspecified, uncomplicated: Secondary | ICD-10-CM | POA: Diagnosis present

## 2013-03-13 LAB — ETHANOL: Alcohol, Ethyl (B): 53 mg/dL — ABNORMAL HIGH (ref 0–11)

## 2013-03-13 MED ORDER — CLONIDINE HCL 0.1 MG PO TABS
0.1000 mg | ORAL_TABLET | Freq: Four times a day (QID) | ORAL | Status: DC
  Administered 2013-03-14 (×2): 0.1 mg via ORAL
  Filled 2013-03-13 (×10): qty 1

## 2013-03-13 MED ORDER — CLONIDINE HCL 0.1 MG PO TABS: 0.1000 mg | ORAL_TABLET | ORAL | Status: DC

## 2013-03-13 MED ORDER — HYDROXYZINE HCL 25 MG PO TABS: 25.0000 mg | ORAL_TABLET | Freq: Four times a day (QID) | ORAL | Status: DC | PRN

## 2013-03-13 MED ORDER — PROMETHAZINE HCL 25 MG/ML IJ SOLN
25.0000 mg | Freq: Three times a day (TID) | INTRAMUSCULAR | Status: DC | PRN
  Filled 2013-03-13: qty 1

## 2013-03-13 MED ORDER — LOPERAMIDE HCL 2 MG PO CAPS: 2.0000 mg | ORAL_CAPSULE | ORAL | Status: DC | PRN

## 2013-03-13 MED ORDER — LORAZEPAM 1 MG PO TABS
ORAL_TABLET | ORAL | Status: AC
  Administered 2013-03-13: 1 mg via ORAL
  Filled 2013-03-13: qty 1

## 2013-03-13 MED ORDER — CHLORDIAZEPOXIDE HCL 25 MG PO CAPS: 50.0000 mg | ORAL_CAPSULE | Freq: Once | ORAL | Status: DC

## 2013-03-13 MED ORDER — DICYCLOMINE HCL 20 MG PO TABS
20.0000 mg | ORAL_TABLET | Freq: Four times a day (QID) | ORAL | Status: DC | PRN
  Administered 2013-03-14: 20 mg via ORAL
  Filled 2013-03-13: qty 1

## 2013-03-13 MED ORDER — CLONIDINE HCL 0.1 MG PO TABS: 0.1000 mg | ORAL_TABLET | Freq: Every day | ORAL | Status: DC

## 2013-03-13 MED ORDER — VITAMIN B-1 100 MG PO TABS
100.0000 mg | ORAL_TABLET | Freq: Every day | ORAL | Status: DC
  Filled 2013-03-13: qty 1

## 2013-03-13 MED ORDER — LORAZEPAM 2 MG/ML IJ SOLN: 1.0000 mg | Freq: Four times a day (QID) | INTRAMUSCULAR | Status: DC | PRN

## 2013-03-13 MED ORDER — METHOCARBAMOL 500 MG PO TABS: 500.0000 mg | ORAL_TABLET | Freq: Three times a day (TID) | ORAL | Status: DC | PRN

## 2013-03-13 MED ORDER — LORAZEPAM 1 MG PO TABS
0.0000 mg | ORAL_TABLET | Freq: Two times a day (BID) | ORAL | Status: DC
  Administered 2013-03-15: 1 mg via ORAL
  Filled 2013-03-13: qty 1

## 2013-03-13 MED ORDER — METHOCARBAMOL 500 MG PO TABS
500.0000 mg | ORAL_TABLET | Freq: Three times a day (TID) | ORAL | Status: DC | PRN
  Administered 2013-03-14: 500 mg via ORAL
  Filled 2013-03-13: qty 1

## 2013-03-13 MED ORDER — ADULT MULTIVITAMIN W/MINERALS CH
1.0000 | ORAL_TABLET | Freq: Every day | ORAL | Status: DC
  Filled 2013-03-13 (×2): qty 1

## 2013-03-13 MED ORDER — LORAZEPAM 1 MG PO TABS
1.0000 mg | ORAL_TABLET | Freq: Four times a day (QID) | ORAL | Status: DC
  Administered 2013-03-13: 1 mg via ORAL
  Filled 2013-03-13: qty 1

## 2013-03-13 MED ORDER — ACETAMINOPHEN 325 MG PO TABS: 650.0000 mg | ORAL_TABLET | Freq: Four times a day (QID) | ORAL | Status: DC | PRN

## 2013-03-13 MED ORDER — THIAMINE HCL 100 MG/ML IJ SOLN: 100.0000 mg | Freq: Once | INTRAMUSCULAR | Status: DC

## 2013-03-13 MED ORDER — CHLORDIAZEPOXIDE HCL 25 MG PO CAPS: 25.0000 mg | ORAL_CAPSULE | Freq: Four times a day (QID) | ORAL | Status: DC

## 2013-03-13 MED ORDER — NAPROXEN 500 MG PO TABS: 500.0000 mg | ORAL_TABLET | Freq: Two times a day (BID) | ORAL | Status: DC | PRN

## 2013-03-13 MED ORDER — ONDANSETRON 4 MG PO TBDP
4.0000 mg | ORAL_TABLET | Freq: Four times a day (QID) | ORAL | Status: DC | PRN
  Administered 2013-03-13: 4 mg via ORAL

## 2013-03-13 MED ORDER — DICYCLOMINE HCL 20 MG PO TABS: 20.0000 mg | ORAL_TABLET | Freq: Four times a day (QID) | ORAL | Status: DC | PRN

## 2013-03-13 MED ORDER — CHLORDIAZEPOXIDE HCL 25 MG PO CAPS: 25.0000 mg | ORAL_CAPSULE | Freq: Every day | ORAL | Status: DC

## 2013-03-13 MED ORDER — THIAMINE HCL 100 MG/ML IJ SOLN: 100.0000 mg | Freq: Every day | INTRAMUSCULAR | Status: DC

## 2013-03-13 MED ORDER — LORAZEPAM 1 MG PO TABS: 1.0000 mg | ORAL_TABLET | Freq: Two times a day (BID) | ORAL | Status: DC

## 2013-03-13 MED ORDER — FOLIC ACID 1 MG PO TABS
1.0000 mg | ORAL_TABLET | Freq: Every day | ORAL | Status: DC
  Administered 2013-03-14: 1 mg via ORAL
  Filled 2013-03-13 (×3): qty 1

## 2013-03-13 MED ORDER — CHLORDIAZEPOXIDE HCL 25 MG PO CAPS: 25.0000 mg | ORAL_CAPSULE | Freq: Four times a day (QID) | ORAL | Status: DC | PRN

## 2013-03-13 MED ORDER — PROMETHAZINE HCL 25 MG/ML IJ SOLN
25.0000 mg | Freq: Three times a day (TID) | INTRAMUSCULAR | Status: DC | PRN
  Administered 2013-03-13: 25 mg via INTRAMUSCULAR
  Filled 2013-03-13: qty 1

## 2013-03-13 MED ORDER — ADULT MULTIVITAMIN W/MINERALS CH
1.0000 | ORAL_TABLET | Freq: Every day | ORAL | Status: DC
  Administered 2013-03-14 – 2013-03-16 (×3): 1 via ORAL
  Filled 2013-03-13 (×4): qty 1

## 2013-03-13 MED ORDER — VITAMIN B-1 100 MG PO TABS
100.0000 mg | ORAL_TABLET | Freq: Every day | ORAL | Status: DC
  Administered 2013-03-14 – 2013-03-16 (×3): 100 mg via ORAL
  Filled 2013-03-13 (×4): qty 1

## 2013-03-13 MED ORDER — CHLORDIAZEPOXIDE HCL 25 MG PO CAPS: 25.0000 mg | ORAL_CAPSULE | Freq: Three times a day (TID) | ORAL | Status: DC

## 2013-03-13 MED ORDER — ONDANSETRON 4 MG PO TBDP: 4.0000 mg | ORAL_TABLET | Freq: Four times a day (QID) | ORAL | Status: DC | PRN

## 2013-03-13 MED ORDER — LOPERAMIDE HCL 2 MG PO CAPS
4.0000 mg | ORAL_CAPSULE | Freq: Once | ORAL | Status: AC
  Administered 2013-03-13: 4 mg via ORAL
  Filled 2013-03-13: qty 2

## 2013-03-13 MED ORDER — NICOTINE 14 MG/24HR TD PT24
14.0000 mg | MEDICATED_PATCH | Freq: Every day | TRANSDERMAL | Status: DC
  Filled 2013-03-13: qty 1

## 2013-03-13 MED ORDER — LORAZEPAM 1 MG PO TABS
0.0000 mg | ORAL_TABLET | Freq: Four times a day (QID) | ORAL | Status: AC
  Administered 2013-03-14 – 2013-03-15 (×5): 1 mg via ORAL
  Filled 2013-03-13 (×5): qty 1

## 2013-03-13 MED ORDER — LORAZEPAM 1 MG PO TABS: 1.0000 mg | ORAL_TABLET | Freq: Three times a day (TID) | ORAL | Status: DC

## 2013-03-13 MED ORDER — CLONIDINE HCL 0.1 MG PO TABS
0.1000 mg | ORAL_TABLET | Freq: Four times a day (QID) | ORAL | Status: DC
  Administered 2013-03-13: 0.1 mg via ORAL
  Filled 2013-03-13 (×3): qty 1

## 2013-03-13 MED ORDER — NAPROXEN 500 MG PO TABS
500.0000 mg | ORAL_TABLET | Freq: Two times a day (BID) | ORAL | Status: DC | PRN
  Administered 2013-03-14: 500 mg via ORAL
  Filled 2013-03-13: qty 1

## 2013-03-13 MED ORDER — LORAZEPAM 1 MG PO TABS: 1.0000 mg | ORAL_TABLET | Freq: Four times a day (QID) | ORAL | Status: DC

## 2013-03-13 MED ORDER — LORAZEPAM 1 MG PO TABS
1.0000 mg | ORAL_TABLET | Freq: Four times a day (QID) | ORAL | Status: DC | PRN
  Administered 2013-03-13 – 2013-03-16 (×6): 1 mg via ORAL
  Filled 2013-03-13 (×7): qty 1

## 2013-03-13 MED ORDER — CHLORDIAZEPOXIDE HCL 25 MG PO CAPS: 25.0000 mg | ORAL_CAPSULE | ORAL | Status: DC

## 2013-03-13 MED ORDER — ONDANSETRON 4 MG PO TBDP
4.0000 mg | ORAL_TABLET | Freq: Four times a day (QID) | ORAL | Status: DC | PRN
  Administered 2013-03-14 – 2013-03-15 (×2): 4 mg via ORAL
  Filled 2013-03-13: qty 1

## 2013-03-13 MED ORDER — MAGNESIUM HYDROXIDE 400 MG/5ML PO SUSP: 30.0000 mL | Freq: Every day | ORAL | Status: DC | PRN

## 2013-03-13 MED ORDER — TRAZODONE HCL 50 MG PO TABS: 50.0000 mg | ORAL_TABLET | Freq: Every evening | ORAL | Status: DC | PRN

## 2013-03-13 MED ORDER — LORAZEPAM 1 MG PO TABS: 1.0000 mg | ORAL_TABLET | Freq: Four times a day (QID) | ORAL | Status: DC | PRN

## 2013-03-13 MED ORDER — ALUM & MAG HYDROXIDE-SIMETH 200-200-20 MG/5ML PO SUSP: 30.0000 mL | ORAL | Status: DC | PRN
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
## 2013-03-13 NOTE — ED Notes (Signed)
Patient awake now and requesting more ativan. States "it wears off very quickly"
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
## 2013-03-13 NOTE — Progress Notes (Signed)
Report received from L. Crissman RN. Writer entered patients room and observed her lying in bed with cloth over her eyes and appeared to be asleep. No distress noted. Safety maintained on unit with 15 min checks, will continue to monitor.

## 2013-03-13 NOTE — Progress Notes (Signed)
Adult Psychoeducational Group Note  Date:  03/13/2013 Time:  8:00PM Group Topic/Focus:  Wrap-Up Group:   The focus of this group is to help patients review their daily goal of treatment and discuss progress on daily workbooks.  Participation Level:  Did Not Attend    Additional Comments:  Pt. Didn't attend group.   Bing Plume D 03/13/2013, 8:15 PM

## 2013-03-13 NOTE — BH Assessment (Signed)
[facility] Assessment Progress Note      Pt accepted to [facility] by aggie nwoko to Dr Geoffery Lyons.  ED staff notified.  Pt signed paperwork and will be transported by security.  [facility] staff to fax facesheet to [place] upon arrival.

## 2013-03-13 NOTE — Progress Notes (Signed)
Communicated to Maryjean Morn PA preexisiting ativan protocol that attending had ordered at 1627.  Informed him that I would medicate patient with 1mg  prn dose of ativan while awaiting new order.  Fluids offered to Mariea and emotional support.

## 2013-03-13 NOTE — ED Notes (Signed)
Act team at bedside for pt evaluation.

## 2013-03-13 NOTE — BH Assessment (Signed)
[facility] Assessment Progress Note   Pt has tentatively been accepted to [facility].  This clinician spoke to Cory Munch there and he was informed by this clinician that [facility] said that [facility] must submit an authorization request in the [facility] system.  On-coming clinician to contact [facility] in AM to confirm that they have gotten the authorization.  Once that is confirmed by clinician then patient may contact her husband to get transportation to [facility].  Patient knows that if it takes her longer than 1 hour to get there she will be turned away.

## 2013-03-13 NOTE — Tx Team (Signed)
Initial Interdisciplinary Treatment Plan  PATIENT STRENGTHS: (choose at least two) Ability for insight Active sense of humor Average or above average intelligence  PATIENT STRESSORS: Educational concerns Financial difficulties Health problems   PROBLEM LIST: Problem List/Patient Goals Date to be addressed Date deferred Reason deferred Estimated date of resolution  Acute Alcoholism 03/13/2013     Cirrhosis of the liver 03/13/2013     Depression 03/13/2013                                          DISCHARGE CRITERIA:  Ability to meet basic life and health needs Adequate post-discharge living arrangements Improved stabilization in mood, thinking, and/or behavior  PRELIMINARY DISCHARGE PLAN: Attend aftercare/continuing care group Attend PHP/IOP Attend 12-step recovery group  PATIENT/FAMIILY INVOLVEMENT: This treatment plan has been presented to and reviewed with the patient, Dawn Foley, and/or family member,.  The patient and family have been given the opportunity to ask questions and make suggestions.  Rich Brave 03/13/2013, 7:21 PM

## 2013-03-13 NOTE — ED Provider Notes (Addendum)
Filed Vitals:   03/13/13 0108  BP: 123/82  Pulse: 85  Temp:   Resp:     Patient seen this AM. Comfortably sleeping. Requesting detox from alcohol. Now pending placement. On CIWA protocol. Medically stable otherwise.   Richardean Canal, MD 03/13/13 [phone]  1:23 PM Patient accepted at [facility] under Dr. Dub Mikes. Stable for transfer.   Richardean Canal, MD 03/13/13 1323

## 2013-03-13 NOTE — Progress Notes (Signed)
Patient accepted to [address] MD [place] by NP [facility]

## 2013-03-14 DIAGNOSIS — F1020 Alcohol dependence, uncomplicated: Principal | ICD-10-CM

## 2013-03-14 MED ORDER — NICOTINE 21 MG/24HR TD PT24
MEDICATED_PATCH | TRANSDERMAL | Status: AC
  Filled 2013-03-14: qty 1

## 2013-03-14 MED ORDER — NICOTINE 21 MG/24HR TD PT24
21.0000 mg | MEDICATED_PATCH | Freq: Every day | TRANSDERMAL | Status: DC
  Administered 2013-03-14 – 2013-03-16 (×3): 21 mg via TRANSDERMAL
  Filled 2013-03-14 (×3): qty 1

## 2013-03-14 MED ORDER — TRAZODONE HCL 50 MG PO TABS
50.0000 mg | ORAL_TABLET | Freq: Every evening | ORAL | Status: DC | PRN
  Administered 2013-03-14 – 2013-03-15 (×4): 50 mg via ORAL
  Filled 2013-03-14: qty 1
  Filled 2013-03-14: qty 28
  Filled 2013-03-14 (×2): qty 1

## 2013-03-14 NOTE — [facility] Suicide Risk Assessment (Signed)
Suicide Risk Assessment  Admission Assessment     Nursing information obtained from:    Demographic factors:    female Current Mental Status:   no si , no hi, no avh Loss Factors:   unable to function Historical Factors:   no past si attemtps Risk Reduction Factors:   lives with husband  CLINICAL FACTORS:   Alcohol/Substance Abuse/Dependencies  COGNITIVE FEATURES THAT CONTRIBUTE TO RISK:  Closed-mindedness    SUICIDE RISK:   Minimal: No identifiable suicidal ideation.  Patients presenting with no risk factors but with morbid ruminations; may be classified as minimal risk based on the severity of the depressive symptoms  PLAN OF CARE:  Continue detox  I certify that inpatient services furnished can reasonably be expected to improve the patient's condition.  Wonda Cerise 03/14/2013, 2:50 PM

## 2013-03-14 NOTE — [facility] Group Notes (Signed)
[facility] Group Notes:  (Clinical Social Work)  03/14/2013     10-11AM  Summary of Progress/Problems:   The main focus of today's process group was for the patient to identify ways in which they have in the past sabotaged their own recovery. Motivational Interviewing was utilized to ask the group members what they get out of their substance use, and what reasons they may have for wanting to change.  The Stages of Change were explained using a handout, and patients identified where they currently are with regard to stages of change.  The patient expressed that she is an alcoholic, that she drinks all day long and likes the way it makes her feel.  However, she frequently blacks out and has woken up in ditches and in strangers' homes not knowing how she got there.  She stated her children live with her mother because of her alcoholism and she wants to become sober for them.  She has cirrhosis of the liver and believes that if she does not stop drinking she will die.  She states also that it smells nasty, makes her unattractive physically, and costs too much money.  She has made the decision to stop drinking, and is in the Planning stage.  Type of Therapy:  Group Therapy - Process   Participation Level:  Active  Participation Quality:  Attentive and Sharing  Affect:  Appropriate  Cognitive:  Oriented  Insight:  Engaged  Engagement in Therapy:  Engaged  Modes of Intervention:  Education, Teacher, English as a foreign language, Motivational Interviewing  Ambrose Mantle, LCSW 03/14/2013, 12:25 PM

## 2013-03-14 NOTE — H&P (Signed)
Psychiatric Admission Assessment Adult  Patient Identification:  Dawn Foley  Date of Evaluation:  03/14/2013  Chief Complaint:  ETOH DEPENDENCE  History of Present Illness: This is a 27 year old Caucasian female. Admitted to [facility] from the [facility] ED with complaints of excessive use of alcohol requesting detoxification treatment. Patient reports, "My husband took me to the [facility] on Wednesday afternoon. We have decided that I needed help for my alcoholism. I was drunk when I got to the hospital. I have been drinking heavily x 5 years. Prior to going to the hospital, I had drunk over a case of beer. I don't have a job. My husband works out of state. So, I get bored easily, then I will drink. My tolerance for alcohol is very high. I feel pretty normal when I drank a lot. So I will drive. I have received and been charged with DUI x 2. I have a pending court date for my DUI. I lost my driver's license in 9604. I was at the [facility] treatment [facility] about 2 months ago for alcohol dependence treatment. I was there x 3 days. I have bad anxiety, that is probably why I got addicted to alcohol. But, I am not depressed".  Elements:  Location:  [facility] adult unit. Quality:  "Cravings, boredom, Bad anxiety". Severity:  Severe. Timing:  "I drank more than a case of beer in 24 hours". Duration:  "I have been drinking heavily x 5 years". Context:  DUI with pending court date, Loss of driving privilege since 5409, .  Associated Signs/Synptoms:  Depression Symptoms:  psychomotor agitation, feelings of worthlessness/guilt, hopelessness, anxiety, insomnia,  (Hypo) Manic Symptoms:  Impulsivity, Irritable Mood,  Anxiety Symptoms:  Excessive Worry,  Psychotic Symptoms:  Hallucinations: Denies  PTSD Symptoms: Had a traumatic exposure:  None reported  Psychiatric Specialty Exam: Physical Exam  Constitutional: She is oriented to person, place, and time. She appears well-developed and  well-nourished.  Eyes: Pupils are equal, round, and reactive to light.  Neck: Normal range of motion.  Cardiovascular: Normal rate.   Respiratory: Effort normal.  GI: Soft.  Musculoskeletal: Normal range of motion.  Neurological: She is alert and oriented to person, place, and time.  Skin: Skin is warm and dry.  Psychiatric: Thought content normal. Her mood appears anxious. Her speech is rapid and/or pressured. She is agitated. Cognition and memory are normal. She expresses impulsivity. She exhibits a depressed mood.    Review of Systems  Constitutional: Positive for chills and malaise/fatigue.  HENT: Negative.   Eyes: Negative.   Respiratory: Negative.   Cardiovascular: Negative.   Gastrointestinal: Positive for nausea.  Genitourinary: Negative.   Skin: Negative.   Neurological: Positive for tremors.  Endo/Heme/Allergies: Negative.   Psychiatric/Behavioral: Positive for substance abuse (Alcoholism). Negative for memory loss. The patient is nervous/anxious and has insomnia.     Blood pressure 105/74, pulse 95, temperature 97 F (36.1 C), temperature source Oral, resp. rate 16, height 5\' 4"  (1.626 m), weight 68.04 kg (150 lb), last menstrual period 02/12/2013, SpO2 96.00%.Body mass index is 25.73 kg/(m^2).  General Appearance: Fairly Groomed  Patent attorney::  Fair  Speech:  Clear and Coherent and Pressured  Volume:  Increased  Mood:  Anxious and Irritable  Affect:  Restricted  Thought Process:  Coherent and Intact  Orientation:  Full (Time, Place, and Person)  Thought Content:  Rumination  Suicidal Thoughts:  No  Homicidal Thoughts:  No  Memory:  Immediate;   Good Recent;  Good Remote;   Good  Judgement:  Impaired  Insight:  Lacking  Psychomotor Activity:  Restlessness and Tremor  Concentration:  Poor  Recall:  Good  Akathisia:  No  Handed:  Right  AIMS (if indicated):     Assets:  Desire for Improvement  Sleep:  Number of Hours: 6.25    Past Psychiatric  History: Diagnosis: Alcohol dependence  Hospitalizations: [facility]  Outpatient Care: [facility]  Substance Abuse Care: [facility] 2 months ago  Self-Mutilation: Denies  Suicidal Attempts: Denies attempts and or thoughts  Violent Behaviors: None reported   Past Medical History:   Past Medical History  Diagnosis Date  . Depression   . Hepatitis   . Alcoholism    None. A llergies:  No Known Allergies  PTA Medications: No prescriptions prior to admission    Previous Psychotropic Medications:  Medication/Dose  See medication lists               Substance Abuse History in the last 12 months:  yes  Consequences of Substance Abuse: Medical Consequences:  Liver damage, Possible death by overdose Legal Consequences:  Arrests, jail time, Loss of driving privilege. Family Consequences:  Family discord, divorce and or separation.  Social History:  reports that she has been smoking Cigarettes.  She has a 16 pack-year smoking history. She does not have any smokeless tobacco history on file. She reports that she drinks about 43.2 ounces of alcohol per week. She reports that she does not use illicit drugs. Additional Social History: History of alcohol / drug use?: Yes Negative Consequences of Use: Financial;Legal;Personal relationships Withdrawal Symptoms: Aggressive/Assaultive;Agitation  Current Place of Residence: [place], [place]    Place of Birth: , [place]    Family Members: "My husband and our 2 girls"  Marital Status:  Married  Children: 2  Sons: 0  Daughters: 2  Relationships: Married  Education:  [employer] Financial planner Problems/Performance: Completed high school  Religious Beliefs/Practices: NA  History of Abuse (Emotional/Phsycial/Sexual): None reported  Occupational Experiences: English as a second language teacher History:  None.  Legal History: Pending DUI charge court date  Hobbies/Interests: None reported  Family History:  History reviewed. No pertinent  family history.  Results for orders placed during the hospital encounter of 03/12/13 (from the past 72 hour(s))  CBC     Status: Abnormal   Collection Time    03/12/13  1:28 PM      Result Value Range   WBC 5.0  4.0 - 10.5 K/uL   RBC 5.09  3.87 - 5.11 MIL/uL   Hemoglobin 14.5  12.0 - 15.0 g/dL   HCT 62.9  52.8 - 41.3 %   MCV 83.5  78.0 - 100.0 fL   MCH 28.5  26.0 - 34.0 pg   MCHC 34.1  30.0 - 36.0 g/dL   RDW 24.4 (*) 01.0 - 27.2 %   Platelets 93 (*) 150 - 400 K/uL   Comment: REPEATED TO VERIFY     SPECIMEN CHECKED FOR CLOTS     PLATELET COUNT CONFIRMED BY SMEAR  COMPREHENSIVE METABOLIC PANEL     Status: Abnormal   Collection Time    03/12/13  1:28 PM      Result Value Range   Sodium 140  135 - 145 mEq/L   Potassium 4.1  3.5 - 5.1 mEq/L   Chloride 104  96 - 112 mEq/L   CO2 22  19 - 32 mEq/L   Glucose, Bld 92  70 - 99 mg/dL   BUN  7  6 - 23 mg/dL   Creatinine, Ser 1.32 (*) 0.50 - 1.10 mg/dL   Calcium 8.9  8.4 - 44.0 mg/dL   Total Protein 9.0 (*) 6.0 - 8.3 g/dL   Albumin 4.1  3.5 - 5.2 g/dL   AST 102 (*) 0 - 37 U/L   ALT 301 (*) 0 - 35 U/L   Alkaline Phosphatase 88  39 - 117 U/L   Total Bilirubin 0.4  0.3 - 1.2 mg/dL   GFR calc non Af Amer >90  >90 mL/min   GFR calc Af Amer >90  >90 mL/min   Comment:            The eGFR has been calculated     using the CKD EPI equation.     This calculation has not been     validated in all clinical     situations.     eGFR's persistently     <90 mL/min signify     possible Chronic Kidney Disease.  ETHANOL     Status: Abnormal   Collection Time    03/12/13  1:28 PM      Result Value Range   Alcohol, Ethyl (B) 413 (*) 0 - 11 mg/dL   Comment:            LOWEST DETECTABLE LIMIT FOR     SERUM ALCOHOL IS 11 mg/dL     FOR MEDICAL PURPOSES ONLY     CRITICAL RESULT CALLED TO, READ BACK BY AND VERIFIED WITH:     BRANCH,RN ON [id number] AT 1433 BY [facility] M  URINE RAPID DRUG SCREEN (HOSP PERFORMED)     Status: None   Collection Time     03/12/13  1:29 PM      Result Value Range   Opiates NONE DETECTED  NONE DETECTED   Cocaine NONE DETECTED  NONE DETECTED   Benzodiazepines NONE DETECTED  NONE DETECTED   Amphetamines NONE DETECTED  NONE DETECTED   Tetrahydrocannabinol NONE DETECTED  NONE DETECTED   Barbiturates NONE DETECTED  NONE DETECTED   Comment:            DRUG SCREEN FOR MEDICAL PURPOSES     ONLY.  IF CONFIRMATION IS NEEDED     FOR ANY PURPOSE, NOTIFY LAB     WITHIN 5 DAYS.                LOWEST DETECTABLE LIMITS     FOR URINE DRUG SCREEN     Drug Class       Cutoff (ng/mL)     Amphetamine      1000     Barbiturate      200     Benzodiazepine   200     Tricyclics       300     Opiates          300     Cocaine          300     THC              50  POCT PREGNANCY, URINE     Status: None   Collection Time    03/12/13  1:37 PM      Result Value Range   Preg Test, Ur NEGATIVE  NEGATIVE   Comment:            THE SENSITIVITY OF THIS     METHODOLOGY IS >24 mIU/mL  ETHANOL     Status:  Abnormal   Collection Time    03/13/13 12:36 AM      Result Value Range   Alcohol, Ethyl (B) 53 (*) 0 - 11 mg/dL   Comment:            LOWEST DETECTABLE LIMIT FOR     SERUM ALCOHOL IS 11 mg/dL     FOR MEDICAL PURPOSES ONLY   Psychological Evaluations:  Assessment:   AXIS I:  Alcohol dependence AXIS II:  Deferred AXIS III:   Past Medical History  Diagnosis Date  . Depression   . Hepatitis   . Alcoholism    AXIS IV:  Chronic alcoholism AXIS V:  1-10 persistent dangerousness to self and others present  Treatment Plan/Recommendations: 1. Admit for crisis management and stabilization, estimated length of stay 3-5 days.  2. Medication management to reduce current symptoms to base line and improve the patient's overall level of functioning; (a). Continue Ativan protocol. 3. Treat health problems as indicated.  4. Develop treatment plan to decrease risk of relapse upon discharge and the need for readmission.  5.  Psycho-social education regarding relapse prevention and self care.  6. Health care follow up as needed for medical problems.  7. Review, reconcile, and reinstate any pertinent home medications for other health issues where appropriate. 8. Call for consults with hospitalist for any additional specialty patient care services as needed.  Treatment Plan Summary: Daily contact with patient to assess and evaluate symptoms and progress in treatment Medication management  Current Medications:  Current Facility-Administered Medications  Medication Dose Route Frequency Provider Last Rate Last Dose  . cloNIDine (CATAPRES) tablet 0.1 mg  0.1 mg Oral QID Court Joy, PA-C   0.1 mg at 03/14/13 0802   Followed by  . [START ON 03/16/2013] cloNIDine (CATAPRES) tablet 0.1 mg  0.1 mg Oral BH-qamhs Court Joy, PA-C       Followed by  . [START ON 03/19/2013] cloNIDine (CATAPRES) tablet 0.1 mg  0.1 mg Oral QAC breakfast Court Joy, PA-C      . dicyclomine (BENTYL) tablet 20 mg  20 mg Oral Q6H PRN Court Joy, PA-C   20 mg at 03/14/13 0617  . folic acid (FOLVITE) tablet 1 mg  1 mg Oral Daily Court Joy, PA-C   1 mg at 03/14/13 0802  . hydrOXYzine (ATARAX/VISTARIL) tablet 25 mg  25 mg Oral Q6H PRN Court Joy, PA-C      . loperamide (IMODIUM) capsule 2-4 mg  2-4 mg Oral PRN Court Joy, PA-C      . LORazepam (ATIVAN) tablet 1 mg  1 mg Oral Q6H PRN Court Joy, PA-C   1 mg at 03/14/13 4782   Or  . LORazepam (ATIVAN) injection 1 mg  1 mg Intravenous Q6H PRN Court Joy, PA-C      . LORazepam (ATIVAN) tablet 0-4 mg  0-4 mg Oral Q6H Court Joy, PA-C       Followed by  . [START ON 03/15/2013] LORazepam (ATIVAN) tablet 0-4 mg  0-4 mg Oral Q12H Court Joy, PA-C      . methocarbamol (ROBAXIN) tablet 500 mg  500 mg Oral Q8H PRN Court Joy, PA-C      . multivitamin with minerals tablet 1 tablet  1 tablet Oral Daily Court Joy, PA-C   1 tablet at 03/14/13 9562  .  naproxen (NAPROSYN) tablet 500 mg  500 mg Oral BID PRN Court Joy, PA-C      .  nicotine (NICODERM CQ - dosed in mg/24 hours) patch 21 mg  21 mg Transdermal Daily Rachael Fee, MD   21 mg at 03/14/13 1610  . ondansetron (ZOFRAN-ODT) disintegrating tablet 4 mg  4 mg Oral Q6H PRN Court Joy, PA-C   4 mg at 03/14/13 9604  . thiamine (VITAMIN B-1) tablet 100 mg  100 mg Oral Daily Court Joy, PA-C   100 mg at 03/14/13 5409   Or  . thiamine (B-1) injection 100 mg  100 mg Intravenous Daily Court Joy, PA-C        Observation Level/Precautions:  15 minute checks  Laboratory:  Reviewed ED lab findings on file  Psychotherapy:  Group sessions  Medications:  See medication lists  Consultations:  As needed  Discharge Concerns:  Sobriety  Estimated LOS: 3-5 days  Other:     I certify that inpatient services furnished can reasonably be expected to improve the patient's condition.   Armandina Stammer I 7/5/20149:48 AM

## 2013-03-14 NOTE — H&P (Signed)
  Pt was seen by me today and I agree with the key elements documented in H&P.  

## 2013-03-14 NOTE — [facility] Counselor (Signed)
Adult Comprehensive Assessment  Patient ID: Dawn Foley, female   DOB: 07/20/1986, 27 y.o.   MRN: [id number]  Information Source: Information source: Patient  Current Stressors:  Educational / Learning stressors: Denies Employment / Job issues: Scientist, physiological find a job Family Relationships: Denies Surveyor, quantity / Lack of resources (include bankruptcy): Denies Housing / Lack of housing: Denies Physical health (include injuries & life threatening diseases): Smokiing cigarettes Social relationships: Denies Substance abuse: Alcohol abuse stresses her because she is dependent Bereavement / Loss: Denies  Living/Environment/Situation:  Living Arrangements: Spouse/significant other;Children (husband and 2 children) Living conditions (as described by patient or guardian): [place] How long has patient lived in current situation?: 4 years What is atmosphere in current home: Loving;Supportive;Comfortable  Family History:  Marital status: Married Number of Years Married: 3 What types of issues is patient dealing with in the relationship?: Denies Does patient have children?: Yes How many children?: 2 (4yo and 5yo) How is patient's relationship with their children?: Good  Childhood History:  By whom was/is the patient raised?: Mother Additional childhood history information: Does not know where father is Description of patient's relationship with caregiver when they were a child: Bad relationship with mother Patient's description of current relationship with people who raised him/her: Bad relationship still with mother Does patient have siblings?: No Did patient suffer any verbal/emotional/physical/sexual abuse as a child?: No Did patient suffer from severe childhood neglect?: No Has patient ever been sexually abused/assaulted/raped as an adolescent or adult?: No Was the patient ever a victim of a crime or a disaster?: No Witnessed domestic violence?: No Has patient been effected by domestic violence  as an adult?: No  Education:  Highest grade of school patient has completed: 1 year college Currently a student?: No Learning disability?: No  Employment/Work Situation:   Employment situation: Unemployed What is the longest time patient has a held a job?: 3 years Where was the patient employed at that time?: factory Has patient ever been in the [employer]?: No Has patient ever served in Buyer, retail?: No  Financial Resources:   Surveyor, quantity resources: Income from spouse  Alcohol/Substance Abuse:   What has been your use of drugs/alcohol within the last 12 months?: Daily alcohol If attempted suicide, did drugs/alcohol play a role in this?: No Alcohol/Substance Abuse Treatment Hx: Denies past history Has alcohol/substance abuse ever caused legal problems?: No  Social Support System:   Conservation officer, nature Support System: Good Describe Community Support System: husband, children Type of faith/religion: Christianity How does patient's faith help to cope with current illness?: Helps  Leisure/Recreation:   Leisure and Hobbies: Refuses to answer - does not feel well  Strengths/Needs:   What things does the patient do well?: Refuses to answer - does not feel well In what areas does patient struggle / problems for patient: Drinking  Discharge Plan:   Does patient have access to transportation?: Yes Will patient be returning to same living situation after discharge?: Yes Currently receiving community mental health services: No If no, would patient like referral for services when discharged?: No Does patient have financial barriers related to discharge medications?: No  Summary/Recommendations:    This is a 27yo Caucasian female who was admitted for detox from alcohol.  She was very hostile during this interview.  She does not have therapy or medication management in place, and refuses all referrals.  She lives with her husband and 2 children, does not get along with her mother, but states that  her mother now has the 2 children aged  4 and 5.  She would benefit from safety monitoring, medication evaluation, psychoeducation, group therapy, and discharge planning to link with ongoing resources.   Sarina Ser. 03/14/2013

## 2013-03-14 NOTE — Progress Notes (Addendum)
Patient ID: Dawn Foley, female   DOB: Nov 03, 1985, 27 y.o.   MRN: [id number] D: Pt is awake and active on the unit this AM. Pt denies SI/HI and A/V hallucinations. Pt rates their depression at 1 and hopelessness at 1. Pt's most recent CIWA score was 7. Pt mood is depressed and her affect is flat/blunted. Pt writes that she would like "to get my family Dr. To get on meds." Pt is resistant to programming or discussions regarding addiction and insists that her Dr. Evaristo Bury prescribe medication that will address any problem she may have. She desires Anabuse and Xanax for treatment. Writer explained that these drugs are not tx's in themselves and that she will need to participate in a program of recovery if she wants to stay sober in the long term. Pt also walked out of a recovery group, and does not seem vested in treatment at this time. Pt states that she has an appointment on Tues 7/8 at 1400 hrs, and this is why she signed a 72 hr request for discharge.   A: Encouraged pt to discuss feelings with staff and administered medication per MD orders. Writer also encouraged pt to participate in groups.  R: Pt is attending groups and tolerating medications well. Writer will continue to monitor. 15 minute checks are ongoing for safety.

## 2013-03-14 NOTE — Progress Notes (Signed)
Adult Psychoeducational Group Note  Date:  03/14/2013 Time:  1300  Group Topic/Focus:  Making Healthy Choices:   The focus of this group is to help patients identify negative/unhealthy choices they were using prior to admission and identify positive/healthier coping strategies to replace them upon discharge. Recovery Goals:   The focus of this group is to identify appropriate goals for recovery and establish a plan to achieve them.  Participation Level:  None  Participation Quality:  Inattentive and Resistant  Affect:  Blunted and Resistant  Cognitive:  Confused  Insight: Lacking  Engagement in Group:  Lacking and Resistant  Modes of Intervention:  Discussion and Education  Additional Comments:  Pt left group to avoid discussion about addiction and recovery.  Londan Coplen Shari Prows 03/14/2013, 3:21 PM

## 2013-03-14 NOTE — Progress Notes (Signed)
Psychoeducational Group Note  Date:  03/14/2013 Time:  0945 am  Group Topic/Focus:  Identifying Needs:   The focus of this group is to help patients identify their personal needs that have been historically problematic and identify healthy behaviors to address their needs.  Participation Level:  Minimal  Participation Quality:  Appropriate  Affect:  Appropriate  Cognitive:  Alert and Appropriate  Insight:  Developing/Improving  Engagement in Group:  Developing/Improving  Additional Comments:    Andrena Mews 03/14/2013,9:47 AM

## 2013-03-15 MED ORDER — QUETIAPINE FUMARATE 200 MG PO TABS
200.0000 mg | ORAL_TABLET | Freq: Every day | ORAL | Status: DC
  Administered 2013-03-15: 200 mg via ORAL
  Filled 2013-03-15 (×2): qty 1

## 2013-03-15 MED ORDER — QUETIAPINE FUMARATE 50 MG PO TABS
50.0000 mg | ORAL_TABLET | Freq: Three times a day (TID) | ORAL | Status: DC
  Administered 2013-03-15 – 2013-03-16 (×4): 50 mg via ORAL
  Filled 2013-03-15 (×8): qty 1

## 2013-03-15 NOTE — Progress Notes (Signed)
Patient did attend the evening speaker AA meeting.  

## 2013-03-15 NOTE — Progress Notes (Signed)
Adult Psychoeducational Group Note  Date:  03/15/2013 Time:  1300  Group Topic/Focus:  Recovery Goals:   The focus of this group is to identify appropriate goals for recovery and establish a plan to achieve them. Spirituality:   The focus of this group is to discuss how one's spirituality can aide in recovery.  Participation Level:  Active  Participation Quality:  Appropriate and Attentive  Affect:  Appropriate  Cognitive:  Alert and Appropriate  Insight: Improving  Engagement in Group:  Engaged  Modes of Intervention:  Discussion, Education and Exploration  Additional Comments:  Pt is more attentive during group and participated in the discussion.  Kami Kube Shari Prows 03/15/2013, 4:10 PM

## 2013-03-15 NOTE — Progress Notes (Signed)
Adult Psychoeducational Group Note  Date:  03/15/2013 Time:  6:21 PM  Group Topic/Focus:  Different Perspectives  Participation Level:  Active  Participation Quality:  Appropriate, Sharing and Supportive  Affect:  Anxious and Appropriate  Cognitive:  Appropriate  Insight: Appropriate  Engagement in Group:  Engaged and Supportive  Modes of Intervention:  Discussion, Socialization and Support  Additional Comments:  Pt attended group and actively participated. Pt stated that her irrational fear is that she would be alone. Pt stated that she meets people everywhere she goes and always likes to have a good time. Pt was provided with encouragement and support. The purpose of this group is to be able to identify at least one positive thought that challenges an irrational one. The patient will be able to discuss the importance of seeing things from a different perspective.   Caswell Corwin 03/15/2013, 6:21 PM

## 2013-03-15 NOTE — [facility] Group Notes (Signed)
[facility] Group Notes:  (Clinical Social Work)  03/15/2013  10:00-11:00AM  Summary of Progress/Problems:   The main focus of today's process group was to   identify the patient's current support system and decide on other supports that can be put in place.  Four definitions/levels of support were discussed and an exercise was utilized to show how much stronger we become with additional supports.  An emphasis was placed on using counselor, doctor, therapy groups, 12-step groups, and problem-specific support groups to expand supports, as well as doing something different than has been done before. The patient expressed a willingness to add a doctor, psychiatrist, church activities, and going back to school to existent supports of husbands, children, and one lady from church.   Type of Therapy:  Process Group with Motivational Interviewing  Participation Level:  Active  Participation Quality:  Attentive and Sharing  Affect:  Appropriate  Cognitive:  Oriented  Insight:  Engaged  Engagement in Therapy:  Engaged  Modes of Intervention:   Education, Support and Processing, Activity  [employer], LCSW 03/15/2013, 1:10 PM

## 2013-03-15 NOTE — Progress Notes (Addendum)
PATIENT WANTS TO BE DISCHARGED TODAY.  D:  Patient's self inventory sheet, patient sleeps well, has good appetite, normal energy level, good attention span.  Rated depression and hopelessness #1.  Denied withdrawals  Denied SI.  Denied physical problems.  Worst pain #1.  Will work with family after discharge.  Does have discharge plans.  No problems taking meds after discharge. A:  Medications administered per MD orders.  Emotional support and encouragement given patient. R:  Denied SI and Hi.   Denied A/V hallucinations.  Denied pain.  Wants to be discharged today.  Will continue to monitor patient for safety every 15 minutes with checks.  Safety maintained. Patient has asked several times about discharge date.

## 2013-03-15 NOTE — Progress Notes (Signed)
Writer and staff counseled pt and another pt about smoking in the facility. Pt admits to hiding her cigarettes in another pt's room and smoking in that same room in order to make it seem like it was the other person. Pt states that she smoked her remaining cigarettes and flushed her matches down the toilet because she had no more use for them. She denies having a lighter. Environmentals were conducted in both rooms and no further paraphernalia was found.The other pt in question was also present, and denies smoking at all while here.

## 2013-03-15 NOTE — Progress Notes (Signed)
Psychoeducational Group Note  Date:  03/15/2013 Time:  0945 am  Group Topic/Focus:  Making Healthy Choices:   The focus of this group is to help patients identify negative/unhealthy choices they were using prior to admission and identify positive/healthier coping strategies to replace them upon discharge.  Participation Level:  Did Not Attend    Andrena Mews 03/15/2013, 10:29 AM

## 2013-03-15 NOTE — Progress Notes (Signed)
[facility] MD Progress Note  03/15/2013 2:45 PM Dawn Foley  MRN:  [id number]  Subjective:  Dawn Foley signed her 72 hours after she gets here to be allowed to go home. Today, she appear restless, angry agitated wanting to leave, however, the Md on duty did not feel she is ready to be discharged. Dawn Foley reports racing thoughts and insomnia. She states that she did take Seroquel 300 mg at a time and did well on it. She said that she has an appointment on Tuesday with her new doctor to start treatment for her hepatitis C"  Diagnosis:   Axis I: Alcohol dependence Axis II: Deferred Axis III:  Past Medical History  Diagnosis Date  . Depression   . Hepatitis   . Alcoholism    Axis IV: Alcoholism Axis V: 41-50 serious symptoms  ADL's:  Intact  Sleep: Fair  Appetite:  Fair  Suicidal Ideation:  Plan:  Denies Intent:  Denies Means:  Denies Homicidal Ideation:  Plan:  Denies Intent:  Denies Means:  Denies  AEB (as evidenced by):  Psychiatric Specialty Exam: Review of Systems  Constitutional: Negative.   HENT: Negative.   Eyes: Negative.   Respiratory: Negative.   Cardiovascular: Negative.   Gastrointestinal: Negative.   Genitourinary: Negative.   Musculoskeletal: Negative.   Skin: Negative.   Neurological: Positive for tremors.  Endo/Heme/Allergies: Negative.   Psychiatric/Behavioral: Positive for depression and substance abuse (Alcoholism). Negative for suicidal ideas and hallucinations. The patient is nervous/anxious and has insomnia.     Blood pressure 101/68, pulse 89, temperature 98.1 F (36.7 C), temperature source Oral, resp. rate 16, height 5\' 4"  (1.626 m), weight 68.04 kg (150 lb), last menstrual period 02/12/2013, SpO2 96.00%.Body mass index is 25.73 kg/(m^2).  General Appearance: Fairly Groomed  Patent attorney::  Fair  Speech:  Pressured  Volume:  Increased  Mood:  Angry, Anxious, Depressed, Dysphoric and Irritable  Affect:  Labile  Thought Process:  Coherent, Goal  Directed and Intact  Orientation:  Full (Time, Place, and Person)  Thought Content:  Obsessions and Rumination  Suicidal Thoughts:  No  Homicidal Thoughts:  No  Memory:  Immediate;   Good Recent;   Good Remote;   Good  Judgement:  Impaired  Insight:  Lacking  Psychomotor Activity:  Restlessness  Concentration:  Poor  Recall:  Fair  Akathisia:  No  Handed:  Right  AIMS (if indicated):     Assets:  Desire for Improvement  Sleep:  Number of Hours: 5.75   Current Medications: Current Facility-Administered Medications  Medication Dose Route Frequency Provider Last Rate Last Dose  . LORazepam (ATIVAN) tablet 1 mg  1 mg Oral Q6H PRN Court Joy, PA-C   1 mg at 03/15/13 1252   Or  . LORazepam (ATIVAN) injection 1 mg  1 mg Intravenous Q6H PRN Court Joy, PA-C      . LORazepam (ATIVAN) tablet 0-4 mg  0-4 mg Oral Q6H Court Joy, PA-C   1 mg at 03/15/13 1478   Followed by  . LORazepam (ATIVAN) tablet 0-4 mg  0-4 mg Oral Q12H Court Joy, PA-C      . methocarbamol (ROBAXIN) tablet 500 mg  500 mg Oral Q8H PRN Court Joy, PA-C   500 mg at 03/14/13 2007  . multivitamin with minerals tablet 1 tablet  1 tablet Oral Daily Court Joy, PA-C   1 tablet at 03/15/13 2956  . naproxen (NAPROSYN) tablet 500 mg  500 mg Oral BID PRN  Court Joy, PA-C   500 mg at 03/14/13 1511  . nicotine (NICODERM CQ - dosed in mg/24 hours) patch 21 mg  21 mg Transdermal Daily Rachael Fee, MD   21 mg at 03/15/13 0830  . ondansetron (ZOFRAN-ODT) disintegrating tablet 4 mg  4 mg Oral Q6H PRN Court Joy, PA-C   4 mg at 03/15/13 1309  . QUEtiapine (SEROQUEL) tablet 200 mg  200 mg Oral QHS Sanjuana Kava, NP      . QUEtiapine (SEROQUEL) tablet 50 mg  50 mg Oral TID Sanjuana Kava, NP   50 mg at 03/15/13 1252  . thiamine (VITAMIN B-1) tablet 100 mg  100 mg Oral Daily Court Joy, PA-C   100 mg at 03/15/13 0830   Or  . thiamine (B-1) injection 100 mg  100 mg Intravenous Daily Court Joy, PA-C      . traZODone (DESYREL) tablet 50 mg  50 mg Oral QHS PRN,MR X 1 Sanjuana Kava, NP   50 mg at 03/14/13 2256    Lab Results: No results found for this or any previous visit (from the past 48 hour(s)).  Physical Findings: AIMS: Facial and Oral Movements Muscles of Facial Expression: None, normal Lips and Perioral Area: None, normal Jaw: None, normal Tongue: None, normal,Extremity Movements Upper (arms, wrists, hands, fingers): None, normal Lower (legs, knees, ankles, toes): None, normal, Trunk Movements Neck, shoulders, hips: None, normal, Overall Severity Severity of abnormal movements (highest score from questions above): None, normal Incapacitation due to abnormal movements: None, normal Patient's awareness of abnormal movements (rate only patient's report): No Awareness, Dental Status Current problems with teeth and/or dentures?: No Does patient usually wear dentures?: No  CIWA:  CIWA-Ar Total: 1 COWS:  COWS Total Score: 2  Treatment Plan Summary: Daily contact with patient to assess and evaluate symptoms and progress in treatment Medication management  Plan: Supportive approach/coping skills/relapse prevention. Initiated Seroquel 50 mg tid for anxiety and 200 mg Q hs for mood control/sleep Encouraged out of room, participation in group sessions and application of coping skills when distressed. Will continue to monitor response to/adverse effects of medications in use to assure effectiveness. Continue to monitor mood, behavior and interaction with staff and other patients. Continue current plan of care.  Medical Decision Making Problem Points:  Established problem, stable/improving (1), Review of last therapy session (1) and Review of psycho-social stressors (1) Data Points:  Review of medication regiment & side effects (2) Review of new medications or change in dosage (2)  I certify that inpatient services furnished can reasonably be expected to improve the  patient's condition.   Armandina Stammer I, PMHNP-bc 03/15/2013, 2:45 PM

## 2013-03-15 NOTE — Progress Notes (Signed)
Writer spoke with patient before she attended AA group. Patient c/o withdrawal symptoms feeling anxious and muscle spasms in her legs. She requested ativan but it was too soon for her next dose. Writer informed patient of next time available and she received robaxin before group. Writer encouraged her to attend group and once group  ends she should be able to receive her next ativan. Patient was agreeable and plans to come back after group. Patient informed Clinical research associate that she had signed a 72 hour request for discharge and inquired as to when she would be able to discharge. Writer encouraged patient to speak with her doctor concerning this matter. Patient denies si/hi/a/v hallucinations. Safety maintained with 15 min checks, will continue to monitor.

## 2013-03-16 MED ORDER — TRAZODONE HCL 50 MG PO TABS: 50.0000 mg | ORAL_TABLET | Freq: Every evening | ORAL | Status: DC | PRN

## 2013-03-16 MED ORDER — QUETIAPINE FUMARATE 200 MG PO TABS: 200.0000 mg | ORAL_TABLET | Freq: Every day | ORAL | Status: DC

## 2013-03-16 MED ORDER — NICOTINE 14 MG/24HR TD PT24
14.0000 mg | MEDICATED_PATCH | Freq: Every day | TRANSDERMAL | Status: DC
  Filled 2013-03-16: qty 14

## 2013-03-16 NOTE — Progress Notes (Signed)
[facility] Adult Case Management Discharge Plan :  Will you be returning to the same living situation after discharge: Yes,  home with husband At discharge, do you have transportation home?:Yes,  grandmother/mother Do you have the ability to pay for your medications:Yes,  Medicaid  Release of information consent forms completed and in the chart;  Patient's signature needed at discharge.  Patient to Follow up at: Follow-up Information   Follow up with [facility] On 03/17/2013. (Appt. at 2:00PM with Dr. Orvan Falconer for hospital follow up)    Contact information:   [address] [place], [place] [address] phone: [phone] fax: [phone]      Patient denies SI/HI:   Yes,  in group    Safety Planning and Suicide Prevention discussed:  No. SPE not required for this pt. She did not endorse SI during admission or during stay at [facility].   Smart, Deklyn Gibbon 03/16/2013, 11:45 AM

## 2013-03-16 NOTE — Discharge Summary (Signed)
Physician Discharge Summary Note  Patient:  Dawn Foley is an 27 y.o., female MRN:  [id number] DOB:  03-Sep-1986 Patient phone:  [phone] (home)  Patient address:   [address] [place]  [place] [address],   Date of Admission:  03/13/2013 Date of Discharge: 03/16/13  Reason for Admission:  Alcohol detox  Discharge Diagnoses: Principal Problem:   Alcohol dependence  Review of Systems  Constitutional: Negative.   HENT: Negative.   Eyes: Negative.   Respiratory: Negative.   Cardiovascular: Negative.   Gastrointestinal: Negative.   Genitourinary: Negative.   Musculoskeletal: Negative.   Skin: Negative.   Neurological: Negative.   Endo/Heme/Allergies: Negative.   Psychiatric/Behavioral: Positive for substance abuse (Alcoholism). Negative for depression, suicidal ideas, hallucinations and memory loss. The patient is nervous/anxious (Stabilized with medication prior to discharge) and has insomnia (Stabilized with medication prior to discharge).    Axis Diagnosis:   AXIS I:  Alcohol dependence AXIS II:  Deferred AXIS III:   Past Medical History  Diagnosis Date  . Depression   . Hepatitis   . Alcoholism    AXIS IV:  Chronic alcoholism AXIS V:  63  Level of Care:  OP  Hospital Course: This is a 27 year old Caucasian female. Admitted to [facility] from the [facility] ED with complaints of excessive use of alcohol requesting detoxification treatment. Patient reports, "My husband took me to the [facility] on Wednesday afternoon. We have decided that I needed help for my alcoholism. I was drunk when I got to the hospital. I have been drinking heavily x 5 years. Prior to going to the hospital, I had drunk over a case of beer. I don't have a job. My husband works out of state. So, I get bored easily, then I will drink. My tolerance for alcohol is very high. I feel pretty normal when I drank a lot. So I will drive. I have received and been charged with DUI x 2. I have a  pending court date for my DUI. I lost my driver's license in 5366. I was at the [facility] treatment [facility] about 2 months ago for alcohol dependence treatment. I was there x 3 days. I have bad anxiety, that is probably why I got addicted to alcohol. But, I am not depressed".  Upon admission in this hospital and after admission assessment/evaluation, it was determined that Ms. Schleicher was intoxicated and will need detoxification treatment to rid her system off excess alcohol and combat the withdrawal symptoms as well. She was started on lorazepam (Ativan) 1 mg treatment protocol on a tapering dose for her alcohol detoxification. This was chosen in place of Librium treatment protocol because of liver compromization. Ms. Moroni was recently diagnosed with hepatitis. This plan of detoxification treatment protocol will give Ms. Chancy a much cleaner detoxification treatment without endangering her liver functions any further. Per lab reports, patient's liver enzymes were elevated.  Besides the detoxification treatment protocol, Ms. Suydam also received medication management for his mood symptoms. She was ordered seroquel 200 mg Q bedtime for mood control and Trazodone 50 mg Q bedtime for sleep. She was also enrolled in group counseling sessions and activities where she was counseled, taught and  learned coping skills that should help her after discharge to cope better and manage her substance abuse issues for a much sustainable sobriety.   Ms. Market was also enrolled/attended AA/NA meetings being offered and held on this unit. She has no other previous and or identifiable medical conditions  that required treatment and or monitoring. However, she was monitored closely for any potential problems that may arise as a result of and or during detoxification treatment. Patient tolerated her detoxification treatment without any significant adverse effects and or reactions reported.  Patient attended treatment team meeting  this am and met with the treatment team members. Her reason for admission, present symptoms, substance abuse issues, response to treatment and discharge plans discussed. Patient endorsed that she is doing well and stable for discharge to pursue the next phase of her substance abuse treatment on an outpatient basis. She will follow-up care at the [facility] in [place], [place] on 03/17/13 at 02:00 pm with Dr. Orvan Falconer. The address, date, time and contact information for this clinic provided for patient in writing.  She was instructed/encouraged to also join/attend AA meetings being offered and held within her community for much needed support from peers who are and had gone through the same experience.  Ms. Doring is currently being discharged to her home. Upon discharge, patient adamantly denies suicidal, homicidal ideations, auditory, visual hallucinations, delusional thinking and or withdrawal symptoms. Patient left [facility] with all personal belongings in no apparent distress. She received 2 weeks worth supply samples of her [facility] discharge medications. Transportation per family.  Consults:  psychiatry  Significant Diagnostic Studies:  labs: CBC with diff, CMP, UDS, Toxicology tests, U/A  Discharge Vitals:   Blood pressure 111/71, pulse 106, temperature 98.2 F (36.8 C), temperature source Oral, resp. rate 16, height 5\' 4"  (1.626 m), weight 68.04 kg (150 lb), last menstrual period 02/12/2013, SpO2 96.00%. Body mass index is 25.73 kg/(m^2). Lab Results:   No results found for this or any previous visit (from the past 72 hour(s)).  Physical Findings: AIMS: Facial and Oral Movements Muscles of Facial Expression: None, normal Lips and Perioral Area: Minimal Jaw: None, normal Tongue: None, normal,Extremity Movements Upper (arms, wrists, hands, fingers): None, normal Lower (legs, knees, ankles, toes): None, normal, Trunk Movements Neck, shoulders, hips: None, normal, Overall  Severity Severity of abnormal movements (highest score from questions above): None, normal Incapacitation due to abnormal movements: None, normal Patient's awareness of abnormal movements (rate only patient's report): Aware, no distress, Dental Status Current problems with teeth and/or dentures?: No Does patient usually wear dentures?: No  CIWA:  CIWA-Ar Total: 3 COWS:  COWS Total Score: 4  Psychiatric Specialty Exam: See Psychiatric Specialty Exam and Suicide Risk Assessment completed by Attending Physician prior to discharge.  Discharge destination:  Home  Is patient on multiple antipsychotic therapies at discharge:  No   Has Patient had three or more failed trials of antipsychotic monotherapy by history:  No  Recommended Plan for Multiple Antipsychotic Therapies: NA     Medication List       Indication   QUEtiapine 200 MG tablet  Commonly known as:  SEROQUEL  Take 1 tablet (200 mg total) by mouth at bedtime. For mood control/sleep   Indication:  Trouble Sleeping, Mood control     traZODone 50 MG tablet  Commonly known as:  DESYREL  Take 1 tablet (50 mg total) by mouth at bedtime as needed and may repeat dose one time if needed for sleep (May re). For sleep   Indication:  Trouble Sleeping       Follow-up Information   Follow up with [facility] On 03/17/2013. (Appt. at 2:00PM with Dr. Orvan Falconer for hospital follow up)    Contact information:   [address] [place], [place] [address] phone:  [phone] fax: [phone]    Follow-up recommendations:  Activity:  As tolerated Diet: As recommended by your primary care doctor. Keep all scheduled follow-up appointments as recommended.  Continue to work the relapse prevention plan Comments:  Take all your medications as prescribed by your mental healthcare provider. Report any adverse effects and or reactions from your medicines to your outpatient provider promptly. Patient is instructed and cautioned to not  engage in alcohol and or illegal drug use while on prescription medicines. In the event of worsening symptoms, patient is instructed to call the crisis hotline, 911 and or go to the nearest ED for appropriate evaluation and treatment of symptoms. Follow-up with your primary care provider for your other medical issues, concerns and or health care needs.   Total Discharge Time:  Greater than 30 minutes.  Signed: Sanjuana Kava, FNP, PMHNP-BC 03/16/2013, 3:22 PM Agree with assessment and plan Reymundo Poll. Dub Mikes, M.D.

## 2013-03-16 NOTE — Tx Team (Signed)
Interdisciplinary Treatment Plan Update (Adult)  Date: 03/16/2013   Time Reviewed: 11:55 AM  Progress in Treatment:  Attending groups: Yes Participating in groups:  Yes Taking medication as prescribed: Yes  Tolerating medication: Yes  Family/Significant othe contact made: No.  Patient understands diagnosis: Yes, AEBseeking treatment for ETOH detox and depression.  Discussing patient identified problems/goals with staff: Yes  Medical problems stabilized or resolved: Yes  Denies suicidal/homicidal ideation: Yes in group.  Patient has not harmed self or Others: Yes  New problem(s) identified: n/a  Discharge Plan or Barriers: Pt to follow up with Dr. Orvan Falconer at [facility] for med management and plans to return home.  Additional comments: Dawn Foley is an 27 y.o. female.  Patient came to [facility] seeking detox. Patient denies any HI, SI or A/V hallucinations. Patient has been drinking "at least" one case of beer and some liquor daily for the past five years. Last drink was on 07/03 in AM prior to arrival. She had consumed over a case of beer in the preceding 24 hours. Patient is irritable, has headache and is restless. When asked how this detox request was different, she said that she has recently been dx'ed with cirrohsis and wants to stop drinking. Patient was at [facility] in the recent past.  Reason for Continuation of Hospitalization: none. D/c today Estimated length of stay: d/c today For review of initial/current patient goals, please see plan of care.  Attendees:  Patient:    Family:    Physician: Geoffery Lyons MD 03/16/2013 11:57 AM   Nursing: Maureen Ralphs RN 03/16/2013 11:57 AM   Clinical Social Worker Adreyan Carbajal Smart, LCSWA  03/16/2013 11:57 AM   Other:  Aggie PA 03/16/2013 11:57 AM   Other:    Other: Massie Kluver, Community Care Coordinator  03/16/2013 11:57 AM   Other:    Scribe for Treatment Team:  Trula Slade LCSWA 03/16/2013 11:57 AM

## 2013-03-16 NOTE — [facility] Suicide Risk Assessment (Signed)
Suicide Risk Assessment  Discharge Assessment     Demographic Factors:  NA  Mental Status Per Nursing Assessment::   On Admission:     Current Mental Status by Physician: In full contact with reality. There are no suicidal ideas, plans or intent. Her mood is euthymic. Her affect is appropriate. States she is fully detox and ready to move on. Aware of the need to abstain. Plans to go to AA as well as pursue medical and psychological treatment   Loss Factors: Decline in physical health  Historical Factors: NA  Risk Reduction Factors:   Sense of responsibility to family and Positive social support  Continued Clinical Symptoms:  Alcohol/Substance Abuse/Dependencies  Cognitive Features That Contribute To Risk:  Closed-mindedness Thought constriction (tunnel vision)    Suicide Risk:  Minimal: No identifiable suicidal ideation.  Patients presenting with no risk factors but with morbid ruminations; may be classified as minimal risk based on the severity of the depressive symptoms  Discharge Diagnoses:   AXIS I:  Alcohol Dependence AXIS II:  Deferred AXIS III:   Past Medical History  Diagnosis Date  . Depression   . Hepatitis   . Alcoholism    AXIS IV:  other psychosocial or environmental problems AXIS V:  61-70 mild symptoms  Plan Of Care/Follow-up recommendations:  Activity:  as tolerated Diet:  regular Continue outpatient follow up Is patient on multiple antipsychotic therapies at discharge:  No   Has Patient had three or more failed trials of antipsychotic monotherapy by history:  No  Recommended Plan for Multiple Antipsychotic Therapies: N/A   Lagretta Loseke A 03/16/2013, 12:09 PM

## 2013-03-16 NOTE — Progress Notes (Signed)
Patient did attend the evening speaker AA meeting.  

## 2013-03-16 NOTE — [facility] Suicide Risk Assessment (Signed)
[facility] INPATIENT: Family/Significant Other Suicide Prevention Education  Suicide Prevention Education:  Education Completed; No one has been identified by the patient as the family member/significant other with whom the patient will be residing, and identified as the person(s) who will aid the patient in the event of a mental health crisis (suicidal ideations/suicide attempt). With written consent from the patient, the family member/significant other has been provided the following suicide prevention education, prior to the and/or following the discharge of the patient.  The suicide prevention education provided includes the following:  Suicide risk factors  Suicide prevention and interventions  National Suicide Hotline telephone number  [facility] assessment telephone number  [facility] Emergency Assistance 911  [facility] and/or Residential Mobile Crisis Unit telephone number Request made of family/significant other to:  Remove weapons (e.g., guns, rifles, knives), all items previously/currently identified as safety concern.  Remove drugs/medications (over-the-counter, prescriptions, illicit drugs), all items previously/currently identified as a safety concern. The family member/significant other verbalizes understanding of the suicide prevention education information provided. The family member/significant other agrees to remove the items of safety concern listed above.  Pt did not c/o SI at admission, nor have they endorsed SI during their stay here. SPE not required.  [employer], LCSWA 03/16/2013 11:44 AM

## 2013-03-16 NOTE — [facility] Group Notes (Signed)
[facility] LCSW Aftercare Discharge Planning Group Note   03/16/2013 10:03 AM  Participation Quality:  DID NOT ATTEND   Smart, [name]

## 2013-03-16 NOTE — Progress Notes (Signed)
Writer spoke with patient at medication window requesting her hs medications. Writer spoke with her concerning the incident involving her smoking today and she reports that she thought that she would be discharged after doing that. Writer informed her that discharge is not always the case when a patient is trying to sabotage their treatment. Patient voiced no other concerns and took her meds. Patient deneis si/hi/a/v hallucinations. Encouraged patient to finish out her treatment instead of wanting to be discharged so quickly. Safety maintained on unit, will continue to monitor with 15 min checks.

## 2013-03-16 NOTE — Progress Notes (Signed)
Patient ID: Dawn Foley, female   DOB: 05/21/1986, 27 y.o.   MRN: [id number] Patient is discharged ambulatory to ride home with her grandmother with whom she will be staying for a few days while her husband is out of town.  She denies SI/HI.  She verbalizes understanding of her d/c meds and followup.  Talked with patient about smoking cessation.  She says she is going to try to decrease from 2 packs to 1 pack daily.  She was given scripts and a supply by MD. Talked with patient about using her coping skills instead of self medicating with alcohol.

## 2013-03-19 NOTE — Progress Notes (Signed)
Patient Discharge Instructions:  After Visit Summary (AVS):   Faxed to:  03/19/13 Discharge Summary Note:   Faxed to:  03/19/13 Psychiatric Admission Assessment Note:   Faxed to:  03/19/13 Suicide Risk Assessment - Discharge Assessment:   Faxed to:  03/19/13 Faxed/Sent to the Next Level Care provider:  03/19/13 Faxed to [facility] - Dr. Orvan Falconer @  [phone]  Jerelene Redden, 03/19/2013, 3:54 PM
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
## 2016-04-07 ENCOUNTER — Encounter (HOSPITAL_COMMUNITY): Payer: Self-pay | Admitting: *Deleted

## 2016-04-07 ENCOUNTER — Inpatient Hospital Stay (HOSPITAL_COMMUNITY)
Admission: AD | Admit: 2016-04-07 | Discharge: 2016-04-12 | DRG: 885 | Disposition: A | Discharge status: Home / Self Care | Payer: Medicaid Other | Attending: Psychiatry | Admitting: Psychiatry

## 2016-04-07 DIAGNOSIS — Z79899 Other long term (current) drug therapy: Secondary | ICD-10-CM

## 2016-04-07 DIAGNOSIS — R45851 Suicidal ideations: Secondary | ICD-10-CM

## 2016-04-07 DIAGNOSIS — F411 Generalized anxiety disorder: Secondary | ICD-10-CM | POA: Diagnosis present

## 2016-04-07 DIAGNOSIS — F339 Major depressive disorder, recurrent, unspecified: Secondary | ICD-10-CM | POA: Diagnosis not present

## 2016-04-07 DIAGNOSIS — G4700 Insomnia, unspecified: Secondary | ICD-10-CM | POA: Diagnosis present

## 2016-04-07 DIAGNOSIS — E039 Hypothyroidism, unspecified: Secondary | ICD-10-CM | POA: Diagnosis present

## 2016-04-07 DIAGNOSIS — F1020 Alcohol dependence, uncomplicated: Secondary | ICD-10-CM | POA: Diagnosis present

## 2016-04-07 DIAGNOSIS — F10239 Alcohol dependence with withdrawal, unspecified: Secondary | ICD-10-CM | POA: Diagnosis present

## 2016-04-07 DIAGNOSIS — B1920 Unspecified viral hepatitis C without hepatic coma: Secondary | ICD-10-CM | POA: Diagnosis present

## 2016-04-07 DIAGNOSIS — F410 Panic disorder [episodic paroxysmal anxiety] without agoraphobia: Secondary | ICD-10-CM | POA: Diagnosis present

## 2016-04-07 LAB — PREGNANCY, URINE: Preg Test, Ur: NEGATIVE

## 2016-04-07 MED ORDER — LORAZEPAM 1 MG PO TABS
1.0000 mg | ORAL_TABLET | Freq: Four times a day (QID) | ORAL | Status: AC
  Administered 2016-04-07 – 2016-04-08 (×6): 1 mg via ORAL
  Filled 2016-04-07 (×6): qty 1

## 2016-04-07 MED ORDER — VITAMIN B-1 100 MG PO TABS
100.0000 mg | ORAL_TABLET | Freq: Every day | ORAL | Status: DC
  Administered 2016-04-07 – 2016-04-12 (×6): 100 mg via ORAL
  Filled 2016-04-07 (×9): qty 1

## 2016-04-07 MED ORDER — ACETAMINOPHEN 325 MG PO TABS
650.0000 mg | ORAL_TABLET | Freq: Four times a day (QID) | ORAL | Status: DC | PRN
  Administered 2016-04-07: 650 mg via ORAL
  Filled 2016-04-07: qty 2

## 2016-04-07 MED ORDER — GABAPENTIN 100 MG PO CAPS
100.0000 mg | ORAL_CAPSULE | Freq: Three times a day (TID) | ORAL | Status: DC
  Administered 2016-04-07 – 2016-04-09 (×8): 100 mg via ORAL
  Filled 2016-04-07 (×13): qty 1

## 2016-04-07 MED ORDER — TRAZODONE HCL 50 MG PO TABS
50.0000 mg | ORAL_TABLET | Freq: Every evening | ORAL | Status: DC | PRN
  Administered 2016-04-07 – 2016-04-11 (×7): 50 mg via ORAL
  Filled 2016-04-07 (×7): qty 1

## 2016-04-07 MED ORDER — PAROXETINE HCL 20 MG PO TABS
20.0000 mg | ORAL_TABLET | Freq: Every day | ORAL | Status: DC
  Administered 2016-04-07 – 2016-04-12 (×6): 20 mg via ORAL
  Filled 2016-04-07 (×9): qty 1

## 2016-04-07 MED ORDER — LEVOTHYROXINE SODIUM 75 MCG PO TABS
75.0000 ug | ORAL_TABLET | Freq: Every day | ORAL | Status: DC
  Administered 2016-04-07 – 2016-04-12 (×6): 75 ug via ORAL
  Filled 2016-04-07 (×9): qty 1

## 2016-04-07 MED ORDER — HYDROXYZINE HCL 25 MG PO TABS
25.0000 mg | ORAL_TABLET | Freq: Four times a day (QID) | ORAL | Status: AC | PRN
  Administered 2016-04-07 – 2016-04-10 (×7): 25 mg via ORAL
  Filled 2016-04-07 (×7): qty 1

## 2016-04-07 MED ORDER — ONDANSETRON 4 MG PO TBDP
4.0000 mg | ORAL_TABLET | Freq: Four times a day (QID) | ORAL | Status: AC | PRN
  Administered 2016-04-07 – 2016-04-09 (×4): 4 mg via ORAL
  Filled 2016-04-07 (×5): qty 1

## 2016-04-07 MED ORDER — LOPERAMIDE HCL 2 MG PO CAPS
2.0000 mg | ORAL_CAPSULE | ORAL | Status: AC | PRN
  Administered 2016-04-07: 2 mg via ORAL
  Administered 2016-04-07: 4 mg via ORAL
  Administered 2016-04-09 (×2): 2 mg via ORAL
  Administered 2016-04-10: 4 mg via ORAL
  Filled 2016-04-07 (×2): qty 2
  Filled 2016-04-07 (×4): qty 1

## 2016-04-07 MED ORDER — LORAZEPAM 1 MG PO TABS
1.0000 mg | ORAL_TABLET | Freq: Three times a day (TID) | ORAL | Status: AC
  Administered 2016-04-08 – 2016-04-09 (×3): 1 mg via ORAL
  Filled 2016-04-07 (×3): qty 1

## 2016-04-07 MED ORDER — LORAZEPAM 1 MG PO TABS: 1.0000 mg | ORAL_TABLET | Freq: Every day | ORAL | Status: DC

## 2016-04-07 MED ORDER — ALUM & MAG HYDROXIDE-SIMETH 200-200-20 MG/5ML PO SUSP: 30.0000 mL | ORAL | Status: DC | PRN

## 2016-04-07 MED ORDER — ADULT MULTIVITAMIN W/MINERALS CH
1.0000 | ORAL_TABLET | Freq: Every day | ORAL | Status: DC
  Administered 2016-04-07 – 2016-04-12 (×6): 1 via ORAL
  Filled 2016-04-07 (×9): qty 1

## 2016-04-07 MED ORDER — LORAZEPAM 1 MG PO TABS: 1.0000 mg | ORAL_TABLET | Freq: Two times a day (BID) | ORAL | Status: DC

## 2016-04-07 MED ORDER — LORAZEPAM 1 MG PO TABS
1.0000 mg | ORAL_TABLET | Freq: Four times a day (QID) | ORAL | Status: DC | PRN
  Administered 2016-04-07 (×2): 1 mg via ORAL
  Filled 2016-04-07 (×2): qty 1

## 2016-04-07 MED ORDER — MAGNESIUM HYDROXIDE 400 MG/5ML PO SUSP: 30.0000 mL | Freq: Every day | ORAL | Status: DC | PRN

## 2016-04-07 NOTE — Tx Team (Signed)
Initial Interdisciplinary Treatment Plan   PATIENT STRESSORS: Substance abuse   PATIENT STRENGTHS: Ability for insight Average or above average intelligence   PROBLEM LIST: Problem List/Patient Goals Date to be addressed Date deferred Reason deferred Estimated date of resolution  Substance abuse 04/07/16           "I drink 1/2 gallon of whiskey daily" 04/07/16                                          DISCHARGE CRITERIA:  Improved stabilization in mood, thinking, and/or behavior Need for constant or close observation no longer present  PRELIMINARY DISCHARGE PLAN: Return to previous living arrangement  PATIENT/FAMIILY INVOLVEMENT: This treatment plan has been presented to and reviewed with the patient, Dawn Foley, and/or family member.  The patient and family have been given the opportunity to ask questions and make suggestions.  [name] 04/07/2016, 12:20 PM

## 2016-04-07 NOTE — [facility] Group Notes (Signed)
[place] [place] Group Notes: (Clinical Social Work)   04/07/2016      Type of Therapy:  Group Therapy   Participation Level:  Did Not Attend despite MHT prompting   Selmer Dominion, LCSW 04/07/2016, 1:10 PM

## 2016-04-07 NOTE — H&P (Signed)
Psychiatric Admission Assessment Adult  Patient Identification: Dawn Foley MRN:  [id number] Date of Evaluation:  04/07/2016 Chief Complaint:  MDD SINGLE EPISODE  ALCOHOL USE DISORDER Principal Diagnosis: Major depressive disorder, recurrent episode with melancholic features ([place]) Diagnosis:   Patient Active Problem List   Diagnosis Date Noted  . Alcohol use disorder, moderate, dependence ([place]) [F10.20] 04/07/2016  . Major depressive disorder, recurrent episode with melancholic features ([place]) 0000000 04/07/2016  . Alcohol dependence ([place]) [F10.20] 05/14/2012   History of Present Illness: PER BH assessment is an 30 y.o. female who was assessed via telepsych by [employer].  The following is a copy of her assessment: Pt endorses SI w/Many plans, including sticking a gun to her head and slitting her wrists.  Pt states she's been having SI for about 3 weeks, ever since her "paw-paw" died.  Pt also endorsed having auditory hallucinations, not related to her alcoholism, basically all her life.  Pt denies the voices are commanding and she's been successful in ignoring them.  Pt admits to not eating in 11 days and not sleeping in about as many days, due to being a "professional alcoholic."  Pt also admits to not attending to her personal hygiene and stated that she doesn't plan on doing so while she is "detoxing." Pt indicates she's been drinking since she was 30 years old, but realized it to be a problem when she was 24 and started drinking daily.  Pt has completed treatment at [facility] in the past.  Pt reports 8 months as the longest time she has been sober, when she was in prison.  Pt reports no current legal issues.  On Evaluation: Dawn Foley is awake, alert and oriented X4, seen resting in bed. Patient reports she is having a hard time detox ing. Denies suicidal or homicidal ideation during this evaluations. Denies auditory or visual hallucination and does not appear to be  responding to internal stimuli. Patient reports drinking 1 pint of whiskey daily. Patient is guarded with answers and responses. Support, encouragement and reassurance was provided.    Associated Signs/Symptoms: Depression Symptoms:  depressed mood, fatigue, anxiety, panic attacks, (Hypo) Manic Symptoms:  Distractibility, Labiality of Mood, Anxiety Symptoms:  Panic Symptoms, Psychotic Symptoms:  Hallucinations: None PTSD Symptoms: Avoidance:  Decreased Interest/Participation Total Time spent with patient: 30 minutes  Past Psychiatric History: See Above  Is the patient at risk to self? Yes.    Has the patient been a risk to self in the past 6 months? Yes.    Has the patient been a risk to self within the distant past? Yes.    Is the patient a risk to others? No.  Has the patient been a risk to others in the past 6 months? No.  Has the patient been a risk to others within the distant past? No.   Prior Inpatient Therapy: Prior Inpatient Therapy: Yes Prior Outpatient Therapy: Prior Outpatient Therapy: Yes Does patient have an ACCT team?: No Does patient have Intensive In-House Services?  : No Does patient have [facility] services? : No Does patient have P4CC services?: No  Alcohol Screening: 1. How often do you have a drink containing alcohol?: 4 or more times a Foley 2. How many drinks containing alcohol do you have on a typical day when you are drinking?: 10 or more 3. How often do you have six or more drinks on one occasion?: Daily or almost daily Preliminary Score: 8 5. How often during the last year have you failed  to do what was normally expected from you becasue of drinking?: Never 6. How often during the last year have you needed a first drink in the morning to get yourself going after a heavy drinking session?: Never 7. How often during the last year have you had a feeling of guilt of remorse after drinking?: Never 8. How often during the last year have you been unable to  remember what happened the night before because you had been drinking?: Never 9. Have you or someone else been injured as a result of your drinking?: No 10. Has a relative or friend or a doctor or another health worker been concerned about your drinking or suggested you cut down?: No Alcohol Use Disorder Identification Test Final Score (AUDIT): 12 Brief Intervention: Yes Substance Abuse History in the last 12 months:  Yes.   Consequences of Substance Abuse: Withdrawal Symptoms:   Cramps Diarrhea Headaches Tremors Previous Psychotropic Medications: no Psychological Evaluations:no Past Medical History:  Past Medical History:  Diagnosis Date  . Alcoholism ([place])   . Depression   . Hepatitis     Past Surgical History:  Procedure Laterality Date  . NO PAST SURGERIES     Family History: History reviewed. No pertinent family history. Family Psychiatric  History: unknown Tobacco Screening: @FLOW [phone])::1)@ Social History:  History  Alcohol Use  . 43.2 oz/Foley  . 14 Cans of beer per Foley    Comment: 3 cases daily     History  Drug Use No    Additional Social History:      Pain Medications: none Prescriptions: none Over the Counter: none History of alcohol / drug use?: No history of alcohol / drug abuse Longest period of sobriety (when/how long): 8 months -- when she was in prison Negative Consequences of Use: Financial Withdrawal Symptoms: Diarrhea, Tremors, Nausea / Vomiting, Agitation Name of Substance 1: Alcohol 1 - Age of First Use: 13 1 - Amount (size/oz): Varied 1 - Frequency: Daily 1 - Duration: Ongoing 1 - Last Use / Amount: 04/06/16                  Allergies:  No Known Allergies Lab Results: No results found for this or any previous visit (from the past 48 hour(s)).  Blood Alcohol level:  Lab Results  Component Value Date   ETH 53 (H) 03/13/2013   ETH 413 (HH) [id number]    Metabolic Disorder Labs:  No results found for: HGBA1C, MPG No  results found for: PROLACTIN No results found for: CHOL, TRIG, HDL, CHOLHDL, VLDL, LDLCALC  Current Medications: Current Facility-Administered Medications  Medication Dose Route Frequency Provider Last Rate Last Dose  . alum & mag hydroxide-simeth (MAALOX/MYLANTA) 200-200-20 MG/5ML suspension 30 mL  30 mL Oral Q4H PRN Niel Hummer, NP      . gabapentin (NEURONTIN) capsule 100 mg  100 mg Oral TID Corena Pilgrim, MD   100 mg at 04/07/16 1651  . hydrOXYzine (ATARAX/VISTARIL) tablet 25 mg  25 mg Oral Q6H PRN Niel Hummer, NP   25 mg at 04/07/16 1145  . levothyroxine (SYNTHROID, LEVOTHROID) tablet 75 mcg  75 mcg Oral QAC breakfast Niel Hummer, NP   75 mcg at 04/07/16 [id number]  . loperamide (IMODIUM) capsule 2-4 mg  2-4 mg Oral PRN Niel Hummer, NP   2 mg at 04/07/16 1356  . LORazepam (ATIVAN) tablet 1 mg  1 mg Oral Q6H PRN Niel Hummer, NP   1 mg at 04/07/16 1356  .  LORazepam (ATIVAN) tablet 1 mg  1 mg Oral QID Niel Hummer, NP   1 mg at 04/07/16 1651   Followed by  . [START ON 04/08/2016] LORazepam (ATIVAN) tablet 1 mg  1 mg Oral TID Niel Hummer, NP       Followed by  . [START ON 04/10/2016] LORazepam (ATIVAN) tablet 1 mg  1 mg Oral BID Niel Hummer, NP       Followed by  . [START ON 04/11/2016] LORazepam (ATIVAN) tablet 1 mg  1 mg Oral Daily Niel Hummer, NP      . magnesium hydroxide (MILK OF MAGNESIA) suspension 30 mL  30 mL Oral Daily PRN Niel Hummer, NP      . multivitamin with minerals tablet 1 tablet  1 tablet Oral Daily Niel Hummer, NP   1 tablet at 04/07/16 [phone]  . ondansetron (ZOFRAN-ODT) disintegrating tablet 4 mg  4 mg Oral Q6H PRN Niel Hummer, NP   4 mg at 04/07/16 1356  . PARoxetine (PAXIL) tablet 20 mg  20 mg Oral Daily Niel Hummer, NP   20 mg at 04/07/16 [id number]  . thiamine (VITAMIN B-1) tablet 100 mg  100 mg Oral Daily Niel Hummer, NP   100 mg at 04/07/16 [id number]  . traZODone (DESYREL) tablet 50 mg  50 mg Oral QHS PRN,MR X 1 Niel Hummer, NP       PTA  Medications: Prescriptions Prior to Admission  Medication Sig Dispense Refill Last Dose  . carbamazepine (TEGRETOL) 200 MG tablet Take 200 mg by mouth 2 (two) times daily.     . chlordiazePOXIDE (LIBRIUM) 5 MG capsule Take 5 mg by mouth every 6 (six) hours as needed for anxiety.     Marland Kitchen ibuprofen (ADVIL,MOTRIN) 200 MG tablet Take 200 mg by mouth every 6 (six) hours as needed.     Marland Kitchen levothyroxine (SYNTHROID, LEVOTHROID) 75 MCG tablet Take 75 mcg by mouth daily before breakfast.     . PARoxetine (PAXIL) 20 MG tablet Take 20 mg by mouth daily.     . QUEtiapine (SEROQUEL) 200 MG tablet Take 1 tablet (200 mg total) by mouth at bedtime. For mood control/sleep 30 tablet 0 More than a month at Unknown time  . traZODone (DESYREL) 50 MG tablet Take 1 tablet (50 mg total) by mouth at bedtime as needed and may repeat dose one time if needed for sleep (May re). For sleep 60 tablet 0 More than a month at Unknown time    Musculoskeletal: Strength & Muscle Tone: within normal limits Gait & Station: broad based Patient leans: N/A  Psychiatric Specialty Exam: Physical Exam  Vitals reviewed. Constitutional: She is oriented to person, place, and time. She appears well-developed.  Musculoskeletal: Normal range of motion.  Neurological: She is alert and oriented to person, place, and time.  Psychiatric: She has a normal mood and affect. Her behavior is normal.    Review of Systems  Constitutional: Positive for malaise/fatigue.  Musculoskeletal: Positive for back pain, joint pain and myalgias.  Neurological: Positive for tremors.  Psychiatric/Behavioral: Positive for depression and substance abuse. The patient is nervous/anxious.     Blood pressure 105/78, pulse (!) 108, temperature 98.7 F (37.1 C), temperature source Oral, resp. rate 18, height 5\' 4"  (1.626 m), weight 62.6 kg (138 lb).Body mass index is 23.69 kg/m.  General Appearance: Disheveled  Eye Contact:  Minimal  Speech:  Clear and Coherent   Volume:  Normal  Mood:  Anxious and Irritable  Affect:  Constricted  Thought Process:  Coherent  Orientation:  Full (Time, Place, and Person)  Thought Content:  Hallucinations: None  Suicidal Thoughts:  Yes.  with intent/plan  Homicidal Thoughts:  No  Memory:  Immediate;   Fair Recent;   Fair Remote;   Fair  Judgement:  Intact  Insight:  Lacking  Psychomotor Activity:  Restlessness  Concentration:  Concentration: Poor  Recall:  [employer] of Knowledge:  Fair  Language:  Good  Akathisia:  No  Handed:  Right  AIMS (if indicated):     Assets:  Communication Skills Desire for Improvement Housing Social Support  ADL's:  Intact  Cognition:  WNL  Sleep:       I agree with current treatment plan on 04/07/2016, Patient seen face-to-face for psychiatric evaluation follow-up, chart reviewed and case discussed with the MD Sullivan Blasing. Reviewed the information documented and agree with the treatment plan.  Treatment Plan Summary: Daily contact with patient to assess and evaluate symptoms and progress in treatment and Medication management Continue Neurontin 100 mg PO TID for mood stabilization Continue with Paxil 20 mg for mood stabilization. Continue with Trazodone 50 mg for insomnia Started on CWIA/ Ativan Protocol Will continue to monitor vitals ,medication compliance and treatment side effects while patient is here.  Reviewed labs; Hep B and Hep C placed, and acute hepatitis panel BAL - 198, UDS - CSW will start working on disposition.  Patient to participate in therapeutic milieu    Observation Level/Precautions:  15 minute checks  Laboratory:  CBC Chemistry Profile HbAIC UDS UA  Psychotherapy:  Individual and group session  Medications:  See above  Consultations:  Psychiatry  Discharge Concerns:  Safety, stabilization, and risk of access to medication and medication stabilization   Estimated LOS: [id number]  Other:     I certify that inpatient services furnished can  reasonably be expected to improve the patient's condition.    Derrill Center, NP 7/29/20175:08 PM  Patient seen face-to-face for psychiatric evaluation, chart reviewed and case discussed with the physician extender and developed treatment plan. Reviewed the information documented and agree with the treatment plan. Corena Pilgrim, MD

## 2016-04-07 NOTE — [facility] Suicide Risk Assessment (Signed)
[facility] Admission Suicide Risk Assessment   Nursing information obtained from:  Patient Demographic factors:  Caucasian Current Mental Status:  NA Loss Factors:  NA Historical Factors:  NA Risk Reduction Factors:  Sense of responsibility to family  Total Time spent with patient: 30 minutes Principal Problem: Major depressive disorder, recurrent episode with melancholic features ([place]) Diagnosis:   Patient Active Problem List   Diagnosis Date Noted  . Alcohol use disorder, moderate, dependence ([place]) [F10.20] 04/07/2016    Priority: High  . Major depressive disorder, recurrent episode with melancholic features ([place]) 0000000 04/07/2016    Priority: High  . Alcohol dependence ([place]) [F10.20] 05/14/2012   Subjective Data: Patient with severe Alcohol use disorder who presents with worsening depression, anxiety, suicidal thoughts and alcohol withdrawal.  Continued Clinical Symptoms:  Alcohol Use Disorder Identification Test Final Score (AUDIT): 12 The "Alcohol Use Disorders Identification Test", Guidelines for Use in Primary Care, Second Edition.  World Pharmacologist [facility]). Score between 0-7:  no or low risk or alcohol related problems. Score between 8-15:  moderate risk of alcohol related problems. Score between 16-19:  high risk of alcohol related problems. Score 20 or above:  warrants further diagnostic evaluation for alcohol dependence and treatment.   CLINICAL FACTORS:   Severe Anxiety and/or Agitation Depression:   Anhedonia Comorbid alcohol abuse/dependence Hopelessness Impulsivity Insomnia Severe Alcohol/Substance Abuse/Dependencies Previous Psychiatric Diagnoses and Treatments   Musculoskeletal: Strength & Muscle Tone: within normal limits Gait & Station: normal Patient leans: N/A  Psychiatric Specialty Exam: Physical Exam  Psychiatric: Her mood appears anxious. Her affect is labile. Her speech is slurred. She is agitated, withdrawn and combative. Cognition and  memory are normal. She expresses impulsivity. She exhibits a depressed mood. She expresses suicidal ideation.    Review of Systems  Constitutional: Positive for chills, diaphoresis and malaise/fatigue.  HENT: Negative.   Eyes: Negative.   Respiratory: Negative.   Cardiovascular: Negative.   Gastrointestinal: Positive for nausea.  Genitourinary: Negative.   Musculoskeletal: Positive for myalgias.  Skin: Negative.   Neurological: Positive for tremors and weakness.  Endo/Heme/Allergies: Negative.   Psychiatric/Behavioral: Positive for depression, substance abuse and suicidal ideas. The patient is nervous/anxious and has insomnia.     Blood pressure 105/78, pulse (!) 108, temperature 98.7 F (37.1 C), temperature source Oral, resp. rate 18, height 5\' 4"  (1.626 m), weight 62.6 kg (138 lb).Body mass index is 23.69 kg/m.  General Appearance: Casual  Eye Contact:  Fair  Speech:  Garbled and Slow  Volume:  Decreased  Mood:  Angry, Anxious, Depressed and Irritable  Affect:  Constricted and Labile  Thought Process:  Coherent  Orientation:  Full (Time, Place, and Person)  Thought Content:  Logical  Suicidal Thoughts:  Yes.  without intent/plan  Homicidal Thoughts:  No  Memory:  Immediate;   Good Recent;   Good Remote;   Good  Judgement:  Impaired  Insight:  Lacking  Psychomotor Activity:  Psychomotor Retardation, Restlessness and Tremor  Concentration:  Concentration: Poor and Attention Span: Poor  Recall:  Good  Fund of Knowledge:  Good  Language:  Good  Akathisia:  No  Handed:  Right  AIMS (if indicated):     Assets:  Communication Skills Desire for Improvement Social Support  ADL's:  Intact  Cognition:  WNL  Sleep:   poor      COGNITIVE FEATURES THAT CONTRIBUTE TO RISK:  Closed-mindedness and Polarized thinking    SUICIDE RISK:   Mild:  Suicidal ideation of limited frequency, intensity, duration,  and specificity.  There are no identifiable plans, no associated intent,  mild dysphoria and related symptoms, good self-control (both objective and subjective assessment), few other risk factors, and identifiable protective factors, including available and accessible social support.   PLAN OF CARE: 1. Admit for crisis management and stabilization. 2. Medication management to reduce current symptoms to base line and improve the     patient's overall level of functioning 3. Treat health problems as indicated. 4. Develop treatment plan to decrease risk of relapse upon discharge and the need for     readmission. 5. Psycho-social education regarding relapse prevention and self care. 6. Health care follow up as needed for medical problems. 7. Restart home medications where appropriate.   I certify that inpatient services furnished can reasonably be expected to improve the patient's condition.  Corena Pilgrim, MD 04/07/2016, 1:18 PM

## 2016-04-07 NOTE — Progress Notes (Signed)
Patient ID: Dawn Foley, female   DOB: [birth year], 30 y.o.   MRN: [id number]   30 year old white female admitted after she presented to [facility] reporting that she was positive SI. Pt also reported that she wanted to be detox off of alcohol. Once at [facility] patient reported that she having bad withdrawals and that she was too sick to fill out any paperwork. Pt reported that she had just threw up, that was confirmed by intake staff. Pt reported that she was having lots of anxiety, that she was agitated, that she was having abdominal cramps, and that she was hurting all over. Pts CIWA was 14 at time of admission, she was brought back to unit and medicated. This Probation officer attempted to work with patient on her admission paper to include suicide prevention paperwork, patient refused to sign all paperwork she reported that she was "too sick". Pt reported that she lives with her grandmother, and that she drinks 1/2 gallon of whiskey. Pt reported that she uses no other drugs. Pt reported that she has been in inpatient treatment before twice, [place] and [place]. Pt reported that she is supposed to be on medication, but has not taken any in months. Pt reported that she was negative SI/HI, no AH/VH noted.

## 2016-04-08 DIAGNOSIS — F339 Major depressive disorder, recurrent, unspecified: Principal | ICD-10-CM

## 2016-04-08 MED ORDER — LORAZEPAM 1 MG PO TABS
1.0000 mg | ORAL_TABLET | Freq: Four times a day (QID) | ORAL | Status: AC | PRN
  Administered 2016-04-08: 1 mg via ORAL
  Filled 2016-04-08: qty 1

## 2016-04-08 MED ORDER — NICOTINE 21 MG/24HR TD PT24
21.0000 mg | MEDICATED_PATCH | Freq: Every day | TRANSDERMAL | Status: DC
  Administered 2016-04-08 – 2016-04-12 (×5): 21 mg via TRANSDERMAL
  Filled 2016-04-08 (×6): qty 1

## 2016-04-08 MED ORDER — IBUPROFEN 600 MG PO TABS
600.0000 mg | ORAL_TABLET | Freq: Four times a day (QID) | ORAL | Status: DC | PRN
  Administered 2016-04-08 – 2016-04-10 (×3): 600 mg via ORAL
  Filled 2016-04-08 (×3): qty 1

## 2016-04-08 NOTE — Progress Notes (Signed)
[facility] MD Progress Note  04/08/2016 2:32 PM Dawn Foley  MRN:  [id number] Subjective: patient reports " I just want to be left alone. I don't want to hurt myself or anyone else, I want to stop answering questions. "  Objective:Dawn Foley is awake, alert and oriented X4. Seen resting in her bedroom.Denies suicidal or homicidal ideation. Denies auditory or visual hallucination and does not appear to be responding to internal stimuli.  Patient is guarded and flat. Patient is not engaged  In this discussion. Patient appears irritable and angry. Support, encouragement and reassurance was provided.   Principal Problem: Major depressive disorder, recurrent episode with melancholic features ([place]) Diagnosis:   Patient Active Problem List   Diagnosis Date Noted  . Alcohol use disorder, moderate, dependence ([place]) [F10.20] 04/07/2016  . Major depressive disorder, recurrent episode with melancholic features ([place]) 0000000 04/07/2016  . Alcohol dependence ([place]) [F10.20] 05/14/2012   Total Time spent with patient: 30 minutes  Past Psychiatric History:  See Above  Past Medical History:  Past Medical History:  Diagnosis Date  . Alcoholism ([place])   . Depression   . Hepatitis     Past Surgical History:  Procedure Laterality Date  . NO PAST SURGERIES     Family History: History reviewed. No pertinent family history. Family Psychiatric  History: See HPI Social History:  History  Alcohol Use  . 43.2 oz/week  . 60 Cans of beer per week    Comment: 3 cases daily     History  Drug Use No    Social History   Social History  . Marital status: Single    Spouse name: N/A  . Number of children: N/A  . Years of education: N/A   Social History Main Topics  . Smoking status: Never Smoker  . Smokeless tobacco: Never Used  . Alcohol use 43.2 oz/week    72 Cans of beer per week     Comment: 3 cases daily  . Drug use: No  . Sexual activity: Yes    Birth control/ protection: Condom    Other Topics Concern  . None   Social History Narrative  . None   Additional Social History:    Pain Medications: none Prescriptions: none Over the Counter: none History of alcohol / drug use?: No history of alcohol / drug abuse Longest period of sobriety (when/how long): 8 months -- when she was in prison Negative Consequences of Use: Financial Withdrawal Symptoms: Diarrhea, Tremors, Nausea / Vomiting, Agitation Name of Substance 1: Alcohol 1 - Age of First Use: 13 1 - Amount (size/oz): Varied 1 - Frequency: Daily 1 - Duration: Ongoing 1 - Last Use / Amount: 04/06/16                  Sleep: Good  Appetite:  Fair  Current Medications: Current Facility-Administered Medications  Medication Dose Route Frequency Provider Last Rate Last Dose  . alum & mag hydroxide-simeth (MAALOX/MYLANTA) 200-200-20 MG/5ML suspension 30 mL  30 mL Oral Q4H PRN Niel Hummer, NP      . gabapentin (NEURONTIN) capsule 100 mg  100 mg Oral TID Corena Pilgrim, MD   100 mg at 04/08/16 1208  . hydrOXYzine (ATARAX/VISTARIL) tablet 25 mg  25 mg Oral Q6H PRN Niel Hummer, NP   25 mg at 04/08/16 1419  . ibuprofen (ADVIL,MOTRIN) tablet 600 mg  600 mg Oral Q6H PRN Derrill Center, NP   600 mg at 04/08/16 1419  . levothyroxine (SYNTHROID, LEVOTHROID)  tablet 75 mcg  75 mcg Oral QAC breakfast Niel Hummer, NP   75 mcg at 04/08/16 [phone]  . loperamide (IMODIUM) capsule 2-4 mg  2-4 mg Oral PRN Niel Hummer, NP   2 mg at 04/07/16 1356  . LORazepam (ATIVAN) tablet 1 mg  1 mg Oral Q6H PRN Niel Hummer, NP   1 mg at 04/07/16 1356  . LORazepam (ATIVAN) tablet 1 mg  1 mg Oral TID Niel Hummer, NP       Followed by  . [START ON 04/10/2016] LORazepam (ATIVAN) tablet 1 mg  1 mg Oral BID Niel Hummer, NP       Followed by  . [START ON 04/11/2016] LORazepam (ATIVAN) tablet 1 mg  1 mg Oral Daily Niel Hummer, NP      . magnesium hydroxide (MILK OF MAGNESIA) suspension 30 mL  30 mL Oral Daily PRN Niel Hummer, NP       . multivitamin with minerals tablet 1 tablet  1 tablet Oral Daily Niel Hummer, NP   1 tablet at 04/08/16 [id number]  . nicotine (NICODERM CQ - dosed in mg/24 hours) patch 21 mg  21 mg Transdermal Daily Derrill Center, NP   21 mg at 04/08/16 1421  . ondansetron (ZOFRAN-ODT) disintegrating tablet 4 mg  4 mg Oral Q6H PRN Niel Hummer, NP   4 mg at 04/07/16 1356  . PARoxetine (PAXIL) tablet 20 mg  20 mg Oral Daily Niel Hummer, NP   20 mg at 04/08/16 0743  . thiamine (VITAMIN B-1) tablet 100 mg  100 mg Oral Daily Niel Hummer, NP   100 mg at 04/08/16 0743  . traZODone (DESYREL) tablet 50 mg  50 mg Oral QHS PRN,MR X 1 Niel Hummer, NP   50 mg at 04/07/16 2131    Lab Results:  Results for orders placed or performed during the hospital encounter of 04/07/16 (from the past 48 hour(s))  Pregnancy, urine     Status: None   Collection Time: 04/07/16  7:54 AM  Result Value Ref Range   Preg Test, Ur NEGATIVE NEGATIVE    Comment:        THE SENSITIVITY OF THIS METHODOLOGY IS >20 mIU/mL. Performed at [facility]     Blood Alcohol level:  Lab Results  Component Value Date   ETH 53 (H) 03/13/2013   ETH 413 (HH) [id number]    Metabolic Disorder Labs: No results found for: HGBA1C, MPG No results found for: PROLACTIN No results found for: CHOL, TRIG, HDL, CHOLHDL, VLDL, LDLCALC  Physical Findings: AIMS:  , ,  ,  ,    CIWA:  CIWA-Ar Total: 5 COWS:     Musculoskeletal: Strength & Muscle Tone: within normal limits Gait & Station: normal Patient leans: N/A  Psychiatric Specialty Exam: Physical Exam  Vitals reviewed. Constitutional: She is oriented to person, place, and time.  HENT:  Head: Normocephalic.  Cardiovascular: Normal rate.   Musculoskeletal: Normal range of motion.  Neurological: She is alert and oriented to person, place, and time.  Psychiatric: She has a normal mood and affect. Her behavior is normal.    Review of Systems  Constitutional: Positive for  malaise/fatigue.  Musculoskeletal: Positive for joint pain.  Neurological: Positive for tremors.  Psychiatric/Behavioral: Positive for depression and substance abuse. The patient is nervous/anxious and has insomnia.     Blood pressure (!) 95/54, pulse 71, temperature 98.5 F (36.9 C), resp. rate  17, height 5\' 4"  (1.626 m), weight 62.6 kg (138 lb).Body mass index is 23.69 kg/m.  General Appearance: Disheveled  Eye Contact:  Minimal  Speech:  Clear and Coherent  Volume:  Normal  Mood:  Anxious, Depressed and Irritable  Affect:  Labile  Thought Process:  Linear  Orientation:  Full (Time, Place, and Person)  Thought Content:  Hallucinations: None  Suicidal Thoughts:  No  Homicidal Thoughts:  No  Memory:  Immediate;   Fair Recent;   Fair Remote;   Fair  Judgement:  Intact  Insight:  Lacking  Psychomotor Activity:  Restlessness  Concentration:  Concentration: Poor  Recall:  [employer] of Knowledge:  Fair  Language:  Good  Akathisia:  No  Handed:  Right  AIMS (if indicated):     Assets:  Desire for Improvement Resilience Social Support  ADL's:  Intact  Cognition:  WNL  Sleep:  Number of Hours: 6     I agree with current treatment plan on 04/08/2016, Patient seen face-to-face for psychiatric evaluation follow-up, chart reviewed. Reviewed the information documented and agree with the treatment plan.  Treatment Plan Summary: Daily contact with patient to assess and evaluate symptoms and progress in treatment and Medication management  Continue Neurontin 100 mg PO TID for mood stabilization Continue with Paxil 20 mg for mood stabilization. Continue with Trazodone 50 mg for insomnia Started on CWIA/ Ativan Protocol Will continue to monitor vitals ,medication compliance and treatment side effects while patient is here.  Reviewed labs; Hep B and Hep C placed, and acute hepatitis panel BAL - 198, UDS - and Urine pregnancy pending  CSW will start working on disposition.  Patient  to participate in therapeutic milieu   Derrill Center, NP 04/08/2016, 2:32 PM  Patient seen face-to-face for psychiatric evaluation, chart reviewed and case discussed with the physician extender and developed treatment plan. Reviewed the information documented and agree with the treatment plan. Corena Pilgrim, MD

## 2016-04-08 NOTE — BHH Counselor (Signed)
CSW attempted to complete PSA with patient at 9:30 am and patient refused due to sleep.   CSW returned again for another attempt at 11am and patient again refused due to sleep.    Christene Lye MSW, LCSW
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
## 2016-04-08 NOTE — Progress Notes (Signed)
Writer was informed by MHT on the hall that another female patient Renne Crigler) was found in [employer]. Writer spoke with patient about unit rules and she reported that she was asleep and didn't know someone came in her room.

## 2016-04-08 NOTE — Progress Notes (Signed)
D: Pt ate 100% of her lunch meal consisting of: medium size salad, pasta, stuffing, and cookies for dessert.  A: Nurse notified  R: Q15 minute checks will continue for pt safety.
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
## 2016-04-08 NOTE — Progress Notes (Signed)
[facility] Group Notes:  (Nursing/MHT/Case Management/Adjunct)  Date:  04/07/2016  Time:  2045 Type of Therapy:  wrap up group  Participation Level:  Active  Participation Quality:  Attentive, Sharing and Supportive  Affect:  Anxious, Not Congruent and Resistant  Cognitive:  Disorganized  Insight:  Lacking  Engagement in Group:  Developing/Improving  Modes of Intervention:  Clarification, Education and Support  Summary of Progress/Problems: Pt reported that she was happy that she was involuntarily committed. I asked her to explain why she was happy if she wasn't here voluntarily and pt responded " I love it here, I don't want to leave." Pt appears anxious and guarded but then smiles and shares with the group in a happy manner. Pt also questions why she can't have her personal items like her hair straightener, razors, and other items considered contraband even though she has been to treatment before and even prison. It was explained to pt by this writer the reason for withholding such items.   Winfield Rast S 04/08/2016, 2:30 AM

## 2016-04-08 NOTE — BHH Suicide Risk Assessment (Signed)
[facility] INPATIENT:  Family/Significant Other Suicide Prevention Education  Suicide Prevention Education:  Patient Refusal for Family/Significant Other Suicide Prevention Education: The patient Dawn Foley has refused to provide written consent for family/significant other to be provided Family/Significant Other Suicide Prevention Education during admission and/or prior to discharge.  Physician notified.  Christene Lye 04/08/2016, 5:14 PM
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
## 2016-04-08 NOTE — BHH Group Notes (Signed)
[facility] Group Notes: (Clinical Social Work)   04/08/2016      Type of Therapy:  Group Therapy   Participation Level:  Did Not Attend despite MHT prompting   Selmer Dominion, LCSW 04/08/2016, 1:53 PM

## 2016-04-08 NOTE — Progress Notes (Signed)
Patient did attend the evening speaker AA meeting.  

## 2016-04-09 LAB — HEPATITIS PANEL, ACUTE
HCV Ab: >11 s/co ratio — ABNORMAL HIGH (ref 0.0–0.9)
HEP A IGM: NEGATIVE
HEP B C IGM: NEGATIVE
Hepatitis B Surface Ag: NEGATIVE

## 2016-04-09 MED ORDER — GABAPENTIN 100 MG PO CAPS
200.0000 mg | ORAL_CAPSULE | Freq: Three times a day (TID) | ORAL | Status: DC
  Administered 2016-04-10 – 2016-04-12 (×7): 200 mg via ORAL
  Filled 2016-04-09 (×10): qty 2

## 2016-04-09 MED ORDER — LORAZEPAM 1 MG PO TABS
1.0000 mg | ORAL_TABLET | Freq: Four times a day (QID) | ORAL | Status: AC
  Administered 2016-04-09 – 2016-04-10 (×6): 1 mg via ORAL
  Filled 2016-04-09 (×6): qty 1

## 2016-04-09 MED ORDER — LORAZEPAM 1 MG PO TABS
1.0000 mg | ORAL_TABLET | Freq: Two times a day (BID) | ORAL | Status: DC
  Administered 2016-04-12: 1 mg via ORAL
  Filled 2016-04-09: qty 1

## 2016-04-09 MED ORDER — LORAZEPAM 1 MG PO TABS
1.0000 mg | ORAL_TABLET | Freq: Three times a day (TID) | ORAL | Status: AC
  Administered 2016-04-11 (×3): 1 mg via ORAL
  Filled 2016-04-09 (×3): qty 1

## 2016-04-09 MED ORDER — LORAZEPAM 1 MG PO TABS: 1.0000 mg | ORAL_TABLET | Freq: Every day | ORAL | Status: DC

## 2016-04-09 NOTE — BHH Group Notes (Signed)
[facility] LCSW Group Therapy  04/09/2016 4:36 PM  Type of Therapy:  Group Therapy  Participation Level:  Active  Participation Quality:  Monopolizing  Affect:  Excited  Cognitive:  Lacking  Insight:  Monopolizing and Poor  Engagement in Therapy:  Monopolizing  Modes of Intervention:  Confrontation, Discussion, Education, Exploration, Problem-solving, Rapport Building, Socialization and Support  Summary of Progress/Problems: Today's Topic: Overcoming Obstacles. Patients identified one short term goal and potential obstacles in reaching this goal. Patients processed barriers involved in overcoming these obstacles. Patients identified steps necessary for overcoming these obstacles and explored motivation (internal and external) for facing these difficulties head on.  Dawn Foley was attentive and engaged during today's processing group, however she tended to interrupt others and monopolize conversation. Patient states "I want to stay here at least two weeks until I can get into [facility] or something." She did not respond well when CSW informed her that her plan was not realistic. Patient went on to discuss medications and had to be redirected several times. At this time, patient presents with limited insight and does not appear to be making progress in the group setting.   Smart, Dawn Boorman LCSW 04/09/2016, 4:36 PM

## 2016-04-09 NOTE — Progress Notes (Addendum)
Patient ID: Dawn Foley, female   DOB: 1986/08/05, 30 y.o.   MRN: [id number] [facility] MD Progress Note  04/09/2016 6:09 PM Dawn Foley  MRN:  [id number] Subjective: patient reports ongoing significant anxiety, restlessness, which she attributes to ongoing alcohol withdrawal. She reports ongoing symptoms of depression, but denies any suicidal ideations at this time. Denies medication side effects.   Objective: I have discussed case with treatment team and have met with patient. Patient is a 30 year old female, who was admitted due to worsening depression, suicidal ideations, anxiety, in the context of death of father several weeks ago. She reports she had been eating poorly, not sleeping, and had been drinking heavily, up to half a gallon of liquor per day . Reports history of auditory hallucinations. At this time patient presents quite anxious, subjectively agitated, and presents with tremors, some diaphoresis.  She reports dry heaving, nausea, and diarrhea, which she also attributes to withdrawal . Of note, her vital signs are stable. She states she feels " like I am still in pretty bad withdrawal, getting worse "as the Ativan dose decreases as per protocol. She denies suicidal plan or intention and contracts for safety on the unit . She has been going to some groups. Denies medication side effects  Labs - of note, Hep C (+) - discussed this with patient , provided recommendation that she follow up with outpatient PCP, gastroenterologist  For monitoring and management   Principal Problem: Major depressive disorder, recurrent episode with melancholic features [facility]) Diagnosis:   Patient Active Problem List   Diagnosis Date Noted  . Alcohol use disorder, moderate, dependence ([place]) [F10.20] 04/07/2016  . Major depressive disorder, recurrent episode with melancholic features ([place]) [I34.7] 04/07/2016  . Alcohol dependence ([place]) [F10.20] 05/14/2012   Total Time spent with patient: 25 minutes    Past Psychiatric History:  See Above  Past Medical History:  Past Medical History:  Diagnosis Date  . Alcoholism ([place])   . Depression   . Hepatitis     Past Surgical History:  Procedure Laterality Date  . NO PAST SURGERIES     Family History: History reviewed. No pertinent family history. Family Psychiatric  History: See HPI Social History:  History  Alcohol Use  . 43.2 oz/week  . 1 Cans of beer per week    Comment: 3 cases daily     History  Drug Use No    Social History   Social History  . Marital status: Single    Spouse name: N/A  . Number of children: N/A  . Years of education: N/A   Social History Main Topics  . Smoking status: Never Smoker  . Smokeless tobacco: Never Used  . Alcohol use 43.2 oz/week    72 Cans of beer per week     Comment: 3 cases daily  . Drug use: No  . Sexual activity: Yes    Birth control/ protection: Condom   Other Topics Concern  . None   Social History Narrative  . None   Additional Social History:    Pain Medications: none Prescriptions: none Over the Counter: none History of alcohol / drug use?: No history of alcohol / drug abuse Longest period of sobriety (when/how long): 8 months -- when she was in prison Negative Consequences of Use: Financial Withdrawal Symptoms: Diarrhea, Tremors, Nausea / Vomiting, Agitation Name of Substance 1: Alcohol 1 - Age of First Use: 13 1 - Amount (size/oz): Varied 1 - Frequency: Daily 1 - Duration:  Ongoing 1 - Last Use / Amount: 04/06/16  Sleep: Fair, but improving   Appetite:  Fair  Current Medications: Current Facility-Administered Medications  Medication Dose Route Frequency Provider Last Rate Last Dose  . alum & mag hydroxide-simeth (MAALOX/MYLANTA) 200-200-20 MG/5ML suspension 30 mL  30 mL Oral Q4H PRN Niel Hummer, NP      . Derrill Memo ON 04/10/2016] gabapentin (NEURONTIN) capsule 200 mg  200 mg Oral TID Jenne Campus, MD      . hydrOXYzine (ATARAX/VISTARIL) tablet 25 mg   25 mg Oral Q6H PRN Niel Hummer, NP   25 mg at 04/09/16 1625  . ibuprofen (ADVIL,MOTRIN) tablet 600 mg  600 mg Oral Q6H PRN Derrill Center, NP   600 mg at 04/09/16 0810  . levothyroxine (SYNTHROID, LEVOTHROID) tablet 75 mcg  75 mcg Oral QAC breakfast Niel Hummer, NP   75 mcg at 04/09/16 5784  . loperamide (IMODIUM) capsule 2-4 mg  2-4 mg Oral PRN Niel Hummer, NP   2 mg at 04/09/16 1625  . LORazepam (ATIVAN) tablet 1 mg  1 mg Oral Q6H PRN Derrill Center, NP   1 mg at 04/08/16 2138  . LORazepam (ATIVAN) tablet 1 mg  1 mg Oral QID Jenne Campus, MD   1 mg at 04/09/16 1721   Followed by  . [START ON 04/11/2016] LORazepam (ATIVAN) tablet 1 mg  1 mg Oral TID Jenne Campus, MD       Followed by  . [START ON 04/12/2016] LORazepam (ATIVAN) tablet 1 mg  1 mg Oral BID Jenne Campus, MD       Followed by  . [START ON 04/13/2016] LORazepam (ATIVAN) tablet 1 mg  1 mg Oral Daily Corban Kistler A Eliza Grissinger, MD      . magnesium hydroxide (MILK OF MAGNESIA) suspension 30 mL  30 mL Oral Daily PRN Niel Hummer, NP      . multivitamin with minerals tablet 1 tablet  1 tablet Oral Daily Niel Hummer, NP   1 tablet at 04/09/16 [phone]  . nicotine (NICODERM CQ - dosed in mg/24 hours) patch 21 mg  21 mg Transdermal Daily Derrill Center, NP   21 mg at 04/09/16 0809  . ondansetron (ZOFRAN-ODT) disintegrating tablet 4 mg  4 mg Oral Q6H PRN Niel Hummer, NP   4 mg at 04/09/16 1625  . PARoxetine (PAXIL) tablet 20 mg  20 mg Oral Daily Niel Hummer, NP   20 mg at 04/09/16 0807  . thiamine (VITAMIN B-1) tablet 100 mg  100 mg Oral Daily Niel Hummer, NP   100 mg at 04/09/16 9528  . traZODone (DESYREL) tablet 50 mg  50 mg Oral QHS PRN,MR X 1 Niel Hummer, NP   50 mg at 04/08/16 2138    Lab Results:  Results for orders placed or performed during the hospital encounter of 04/07/16 (from the past 48 hour(s))  Hepatitis panel, acute     Status: Abnormal   Collection Time: 04/07/16  6:18 PM  Result Value Ref Range   Hepatitis B  Surface Ag Negative Negative   HCV Ab >11.0 (H) 0.0 - 0.9 s/co ratio    Comment: (NOTE)                                  Negative:     < 0.8  Indeterminate: 0.8 - 0.9                                  Positive:     > 0.9 The CDC recommends that a positive HCV antibody result be followed up with a HCV Nucleic Acid Amplification test ([id number]). Performed At: [facility] [place], [place] [id number] Lindon Romp MD PT:[id number]    Hep A IgM Negative Negative   Hep B C IgM Negative Negative    Comment: Performed at [facility]    Blood Alcohol level:  Lab Results  Component Value Date   ETH 53 (H) 03/13/2013   ETH 413 (HH) 70/09/7492    Metabolic Disorder Labs: No results found for: HGBA1C, MPG No results found for: PROLACTIN No results found for: CHOL, TRIG, HDL, CHOLHDL, VLDL, LDLCALC  Physical Findings: AIMS:  , ,  ,  ,    CIWA:  CIWA-Ar Total: 11 COWS:     Musculoskeletal: Strength & Muscle Tone: within normal limits Gait & Station: normal Patient leans: N/A  Psychiatric Specialty Exam: Physical Exam  Vitals reviewed. Constitutional: She is oriented to person, place, and time.  HENT:  Head: Normocephalic.  Cardiovascular: Normal rate.   Musculoskeletal: Normal range of motion.  Neurological: She is alert and oriented to person, place, and time.  Psychiatric: She has a normal mood and affect. Her behavior is normal.    Review of Systems  Constitutional: Positive for malaise/fatigue.  Musculoskeletal: Positive for joint pain.  Neurological: Positive for tremors.  Psychiatric/Behavioral: Positive for depression and substance abuse. The patient is nervous/anxious and has insomnia.   nausea, vomiting, diarrhea  Blood pressure (!) 110/57, pulse 79, temperature 97.5 F (36.4 C), temperature source Oral, resp. rate 16, height 5' 4"  (1.626 m), weight 138 lb (62.6 kg).Body mass index is 23.69  kg/m.  General Appearance: Fairly Groomed  Eye Contact:  Fair  Speech:  Normal Rate  Volume:  Normal  Mood:  Anxious and Depressed  Affect:  Labile  Thought Process:  Linear  Orientation:  Full (Time, Place, and Person)  Thought Content:  Describes auditory hallucinations, " just random voices ", at this time not internally preoccupied, no delusions expressed   Suicidal Thoughts:  No denies suicidal or self injurious ideations at present, and contracts for safety on the unit   Homicidal Thoughts:  No   Memory:  Immediate;   Fair Recent;   Fair Remote;   Fair  Judgement:  Fair   Insight:  Lacking  Psychomotor Activity: restless, tremulous   Concentration:  Concentration: Fair and Attention Span: Fair  Recall:  [employer] of Knowledge:  Good  Language:  Good  Akathisia:  No  Handed:  Right  AIMS (if indicated):     Assets:  Desire for Improvement Resilience Social Support  ADL's:  Intact  Cognition:  WNL  Sleep:  Number of Hours: 5.75   Assessment- patient reports ongoing depression, but no SI. She reports decreasing hallucinations, and does not appear to be internally preoccupied. Describes ongoing symptoms of alcohol WDL, and feels they are worsening as Ativan dose decreases as per protocol. She does present  Tremulous, somewhat diaphoretic, restless . Vitals are stable.   Treatment Plan Summary: Daily contact with patient to assess and evaluate symptoms and progress in treatment and Medication management  Increase  Neurontin to 200  mg PO TID for anxiety,  mood Continue  Paxil 20 mg  QDAY for depression, anxiety  Continue Trazodone 50 mg QHS PRN for insomnia Continue Synthroid for history of Hypothyroidism  Would restart CIWA protocol with Ativan- to minimize risk of severe alcohol WDL and address ongoing symptoms of WDL .  Encourage patient to follow up with outpatient MD, provider, GI specialist to follow up on Hep C positive status  Treatment team working on  disposition options Will check CBC, BMP, TSH   Nocole Zammit, MD 04/09/2016, 6:09 PM

## 2016-04-09 NOTE — Progress Notes (Signed)
Pt came to the nurse's station before the AA group was over stating that she had vomited "all over the place", but then told the MHT that she "made it to the trash can".  Pt was offered ginger ale as the MHT reported that there was a small amount of spit up in the trash can.  Pt adamantly stated that ginger ale did not help and that she needed something for nausea.  When zofran was offered, she said it did not work and stated she needed Ativan.  Pt was told that if she was actively vomiting, that we needed to take care of that first before she could be given any other medication.  Pt became irritable with writer saying that she only needed the Ativan.  Writer persisted until pt agreed to take the Zofran and wait at least 30 minutes before taking anything else.  Pt took the Zofran, and then when she returned to the med window, she was asking for a sandwich, along with the Ativan and her sleep aid.  Pt was told that she could have crackers and something more tolerable for someone with nausea.  Pt was argumentative with staff over the sandwich, but agreed to the snack.  Pt denies SI/HI/AVH.  She reports moderate withdrawal symptoms and makes frequent requests for medication.  Support and encouragement offered.  Discharge plans are in process.  Safety maintained with q15 minute checks.

## 2016-04-09 NOTE — Progress Notes (Signed)
Recreation Therapy Notes  Date: 04/09/16 Time: 0930 Location: 300 Hall Group Room  Group Topic: Stress Management  Goal Area(s) Addresses:  Patient will verbalize importance of using healthy stress management.  Patient will identify positive emotions associated with healthy stress management.    Intervention: Stress Management  Activity :  Healthy Boundaries Guided Imagery.  LRT introduced the technique of guided imagery to the patients.  Pt were to follow along as the LRT read the script to engaged in the activity.    Education:  Stress Management, Discharge Planning.    Clinical Observations/Feedback: Pt did not attend group.   Victorino Sparrow, LRT/CTRS

## 2016-04-09 NOTE — Tx Team (Signed)
Interdisciplinary Treatment Plan Update (Adult)  Date:  04/09/2016  Time Reviewed:  10:11 AM   Progress in Treatment: Attending groups: Yes. Participating in groups:  Yes. Taking medication as prescribed:  Yes. Tolerating medication:  Yes. Family/Significant othe contact made:  SPE completed with pt; pt declined to consent to family contact.  Patient understands diagnosis:  Yes. and As evidenced by:  seeking treatment for SI with a plan, depression/grief issues, alcohol abuse, and for medication stabilization. Discussing patient identified problems/goals with staff:  Yes. Medical problems stabilized or resolved:  Yes. Denies suicidal/homicidal ideation: Yes. Issues/concerns per patient self-inventory:  Other:  Discharge Plan or Barriers: CSW assessing for appropriate referrals.   Reason for Continuation of Hospitalization: Depression Medication stabilization Suicidal ideation Withdrawal symptoms  Comments:  Dawn Foley is an 30 y.o. female who endorses SI w/Many plans, including sticking a gun to her head and slitting her wrists.  Pt states she's been having SI for about 3 weeks, ever since her "paw-paw" died.  Pt also endorsed having auditory hallucinations, not related to her alcoholism, basically all her life.  Pt denies the voices are commanding and she's been successful in ignoring them.  Pt admits to not eating in 11 days and not sleeping in about as many days, due to being a "professional alcoholic."  Pt also admits to not attending to her personal hygiene and stated that she doesn't plan on doing so while she is "detoxing."Pt indicates she's been drinking since she was 30 years old, but realized it to be a problem when she was 24 and started drinking daily.  Pt has [facility] at [facility] in the past.  Pt reports 8 months as the longest time she has been sober, when she was in prison.  Pt reports no current legal issues.Diagnosis: Major Depressive Disorder, Severe, with  Psychotic features; Bipolar Disorder; r/o Alcohol-induced mood disorder; r/o PTSD (per report)  Estimated length of stay:  2-4 days   New goal(s): to develop effective aftercare plan.   Additional Comments:  Patient and CSW reviewed pt's identified goals and treatment plan. Patient verbalized understanding and agreed to treatment plan. CSW reviewed [facility] "Discharge Process and Patient Involvement" Form. Pt verbalized understanding of information provided and signed form.    Review of initial/current patient goals per problem list:  1. Goal(s): Patient will participate in aftercare plan  Met: No.   Target date: at discharge  As evidenced by: Patient will participate within aftercare plan AEB aftercare provider and housing plan at discharge being identified.  7/31: CSW assessing for appropriate referrals.   2. Goal (s): Patient will exhibit decreased depressive symptoms and suicidal ideations.  Met: No.    Target date: at discharge  As evidenced by: Patient will utilize self rating of depression at 3 or below and demonstrate decreased signs of depression or be deemed stable for discharge by MD.  7/31: Pt rates depression as high; passive SI reported at times. Pt is able to contract for safety on the unit.   3. Goal(s): Patient will demonstrate decreased signs of withdrawal due to substance abuse  Met:No.   Target date:at discharge   As evidenced by: Patient will produce a CIWA/COWS score of 0, have stable vitals signs, and no symptoms of withdrawal.  7/31: Pt reports mild/moderate withdrawals with CIWA score of 4 and low sitting BP. All other vitals are stable.   Attendees: Patient:   04/09/2016 10:11 AM   Family:   04/09/2016 10:11 AM   Physician:  Dr. Parke Poisson; Dr. Shea Evans MD 04/09/2016 10:11 AM   Nursing:   Bayard Hugger RN; Franco Nones RN 04/09/2016 10:11 AM   Clinical Social Worker: Maxie Better, LCSW 04/09/2016 10:11 AM   Clinical Social Worker: Erasmo Downer Drinkard LCSW  04/09/2016 10:11 AM   Other:  Gerline Legacy Nurse Case Manager 04/09/2016 10:11 AM   Other:  Agustina Caroli NP 04/09/2016 10:11 AM   Other:   04/09/2016 10:11 AM   Other:  04/09/2016 10:11 AM   Other:  04/09/2016 10:11 AM   Other:  04/09/2016 10:11 AM    04/09/2016 10:11 AM    04/09/2016 10:11 AM    04/09/2016 10:11 AM    04/09/2016 10:11 AM    Scribe for Treatment Team:   Maxie Better, LCSW 04/09/2016 10:11 AM

## 2016-04-09 NOTE — Progress Notes (Signed)
Patient ID: Dawn Foley, female   DOB: July 07, 1986, 30 y.o.   MRN: [id number]  DAR: Pt. Denies HI and visual Hallucinations. She reports passive SI and hearing voices. She is able to contract for safety and denies that voices are command in nature. She reports sleep is fair, appetite is poor, but improving, energy level is low, and concentration is poor. She rates depression, anxiety, and hopelessness 10/10. Patient does continue to report generalized pain and received PRN Ibuprofen. Support and encouragement provided to the patient. Scheduled medications administered to patient per physician's orders. Patient is minimal and forwards little to writer at this time. She reports continued withdrawal symptoms including diarrhea, anxiety, tremors, and pins and needles feeling. Patient continues to follow Ativan protocol. She reports feeling better than yesterday and appears less anxious. Tremors do appear to be decreased as well. She denies nausea to this writer but is aware that she can receive medication for this if N/V is verbalized. Q15 minute checks are maintained for safety.

## 2016-04-10 DIAGNOSIS — F1020 Alcohol dependence, uncomplicated: Secondary | ICD-10-CM

## 2016-04-10 LAB — CBC WITH DIFFERENTIAL/PLATELET
Basophils Absolute: 0 10*3/uL (ref 0.0–0.1)
Basophils Relative: 0 %
EOS ABS: 0.1 10*3/uL (ref 0.0–0.7)
EOS PCT: 3 %
HCT: 37.6 % (ref 36.0–46.0)
Hemoglobin: 12.4 g/dL (ref 12.0–15.0)
LYMPHS ABS: 0.9 10*3/uL (ref 0.7–4.0)
Lymphocytes Relative: 32 %
MCH: 29.3 pg (ref 26.0–34.0)
MCHC: 33 g/dL (ref 30.0–36.0)
MCV: 88.9 fL (ref 78.0–100.0)
MONO ABS: 0.5 10*3/uL (ref 0.1–1.0)
MONOS PCT: 18 %
Neutro Abs: 1.3 10*3/uL — ABNORMAL LOW (ref 1.7–7.7)
Neutrophils Relative %: 47 %
PLATELETS: DECREASED 10*3/uL (ref 150–400)
RBC: 4.23 MIL/uL (ref 3.87–5.11)
RDW: 16.1 % — ABNORMAL HIGH (ref 11.5–15.5)
WBC: 2.7 10*3/uL — ABNORMAL LOW (ref 4.0–10.5)

## 2016-04-10 LAB — BASIC METABOLIC PANEL
Anion gap: 6 (ref 5–15)
BUN: 6 mg/dL (ref 6–20)
CHLORIDE: 100 mmol/L — AB (ref 101–111)
CO2: 28 mmol/L (ref 22–32)
CREATININE: 0.69 mg/dL (ref 0.44–1.00)
Calcium: 9.4 mg/dL (ref 8.9–10.3)
GFR calc Af Amer: >60 mL/min (ref >60)
GFR calc non Af Amer: >60 mL/min (ref >60)
GLUCOSE: 128 mg/dL — AB (ref 65–99)
Potassium: 4.4 mmol/L (ref 3.5–5.1)
SODIUM: 134 mmol/L — AB (ref 135–145)

## 2016-04-10 LAB — TSH: TSH: 7.26 u[IU]/mL — AB (ref 0.350–4.500)

## 2016-04-10 MED ORDER — HYDROXYZINE HCL 25 MG PO TABS
25.0000 mg | ORAL_TABLET | Freq: Four times a day (QID) | ORAL | Status: DC | PRN
  Administered 2016-04-10 – 2016-04-11 (×2): 25 mg via ORAL
  Filled 2016-04-10 (×2): qty 1

## 2016-04-10 MED ORDER — LOPERAMIDE HCL 2 MG PO CAPS
2.0000 mg | ORAL_CAPSULE | ORAL | Status: DC | PRN
  Administered 2016-04-10: 2 mg via ORAL
  Administered 2016-04-10: 4 mg via ORAL
  Filled 2016-04-10: qty 2

## 2016-04-10 NOTE — Progress Notes (Signed)
Adult Psychoeducational Group Note  Date:  04/10/2016 Time: 0845   Group Topic/Focus:  Orientation:   The focus of this group is to educate the patient on the purpose and policies of crisis stabilization and provide a format to answer questions about their admission.  The group details unit policies and expectations of patients while admitted.   Participation Level:  Active  Participation Quality:  Appropriate  Affect:  Appropriate  Cognitive:  Appropriate  Insight: Appropriate  Engagement in Group:  Engaged  Modes of Intervention:  Activity  Additional Comments:   Tru Leopard L 04/10/2016, 9:32 AM

## 2016-04-10 NOTE — Progress Notes (Signed)
[facility] Group Notes:  (Nursing/MHT/Case Management/Adjunct)  Date:  04/10/2016  Time:  12:38 AM  Type of Therapy:  Psychoeducational Skills  Participation Level:  Active  Participation Quality:  Intrusive and Redirectable  Affect:  Anxious  Cognitive:  Lacking  Insight:  Limited  Engagement in Group:  Distracting and Off Topic  Modes of Intervention:  Education  Summary of Progress/Problems: The patient had to be redirected for talking out of turn on a number of occasions. She did state however that she was pleased with the fact that a new medication was ordered for her. As for the theme of the day, her wellness strategy will be to work on her alcoholism. She attempted to be inappropriate by stating that she wanted to have more sex but was redirected by this author.   Archie Balboa S 04/10/2016, 12:38 AM

## 2016-04-10 NOTE — [facility] Group Notes (Signed)
[facility] LCSW Group Therapy  04/10/2016 2:31 PM  Type of Therapy:  Group Therapy  Participation Level:  Did Not Attend--invited. Chose to remain in bed.   Summary of Progress/Problems: MHA Speaker came to talk about his personal journey with substance abuse and addiction.  Smart, August Gosser LCSW 04/10/2016, 2:31 PM

## 2016-04-10 NOTE — Progress Notes (Signed)
Patient ID: GERLINE RATNER, female   DOB: 23-Dec-1985, 30 y.o.   MRN: [id number]  DAR: Pt. Denies SI/HI and visual Hallucinations. She reports that she continues to hear voices, does not elaborate. She is able to contract for safety if she is feeling unsafe. Patient continues to report generalized pain and withdrawal symptoms including tremors, anxiety, cramping, diarrhea, and agitation. She had an accident in the bathroom this morning, defecating on the bathroom floor. Patient reports she was in bed and tried to get to the bathroom however didn't fully make it. She continues to receive PRN Imodium for this. EVS  cleaned patient's bathroom. Writer spoke with NP Nwoko about possibility of patient having C. Difficile. However, from information received from the patient, no fever, and current withdrawal/detox that patient is experiencing at this time NP Nwoko did not initiate enteric precautions. Patient continues Ativan protocol. Support and encouragement provided to the patient. Scheduled medications administered to patient per physician's orders. Patient is seen in the milieu at times but mostly stays in her room. Q15 minute checks are maintained for safety.

## 2016-04-10 NOTE — [facility] Group Notes (Signed)
I ask patient if I can get her vitals? Patient said a racial  committ" , I asked what does that have to do with me getting your blood pressure. Chaunte said "oh we just want to have some fun while we are here. " I said that is fine if you want to have some fun while you are here and you can do that, and you can do that with your peers."  Patient said she has a perfect blood pressure. I took Makynleigh blood pressure. Ryder asked is that a perfect blood pressure, I asked Diantha to ask the nurse she can tell you. Orine asked why can't you read? I explain," I can read very well but this is something the nurse have to tell you." Navy said she wants another tech. I took Soniya blood pressure sitting and standing and the nurse explain a perfect blood pressure to Rolene.

## 2016-04-10 NOTE — Progress Notes (Signed)
Patient ID: Dawn Foley, female   DOB: 09/11/1985, 30 y.o.   MRN: [id number] Patient ID: Dawn Foley, female   DOB: 05-31-1986, 30 y.o.   MRN: [id number] [facility] MD Progress Note  04/10/2016 2:05 PM Dawn Foley  MRN:  [id number]  Subjective: [name] reports, I'm feeling really bad. I have no appetite. I have bad anxiety symptoms. I feel restless. I'm having diarrheal stools. I got the shakes, not being able to sleep. This is how I get when I'm having alcohol withdrawing symptoms. The abdominal cramps comes with diarrhea". She attributes all her symptoms to the ongoing alcohol withdrawal. She reports ongoing symptoms of depression, but denies any suicidal ideations at this time. Denies medication side effects.   Objective: I have discussed case with treatment team and have met with patient. Patient is a 30 year old female, who was admitted due to worsening depression, suicidal ideations, anxiety, in the context of death of father several weeks ago. She reports she had been eating poorly, not sleeping, and had been drinking heavily, up to half a gallon of liquor per day. She is having bad substance withdrawal symptoms. Reports history of auditory hallucinations. At this time patient presents quite anxious, subjectively agitated, and presents with tremors, some diaphoresis.  She reports dry heaving, nausea, and diarrhea, which she also attributes to withdrawal . Of note, her vital signs are stable. She states she feels " like I am still in pretty bad withdrawal, getting worse. She denies suicidal plan or intention and contracts for safety on the unit. She has been going to some groups sessions today. Denies medication side effects  Labs - of note, Hep C (+) - already discussed this with patient, provided recommendation that she follow up with outpatient PC, gastroenterologist  For monitoring and management.   Principal Problem: Major depressive disorder, recurrent episode with melancholic features  ([place]) Diagnosis:   Patient Active Problem List   Diagnosis Date Noted  . Alcohol use disorder, moderate, dependence ([place]) [F10.20] 04/07/2016  . Major depressive disorder, recurrent episode with melancholic features ([place]) [O06.0] 04/07/2016  . Alcohol dependence ([place]) [F10.20] 05/14/2012   Total Time spent with patient: 25 minutes   Past Psychiatric History:  See Above  Past Medical History:  Past Medical History:  Diagnosis Date  . Alcoholism ([place])   . Depression   . Hepatitis     Past Surgical History:  Procedure Laterality Date  . NO PAST SURGERIES     Family History: History reviewed. No pertinent family history. Family Psychiatric  History: See HPI Social History:  History  Alcohol Use  . 43.2 oz/week  . 85 Cans of beer per week    Comment: 3 cases daily     History  Drug Use No    Social History   Social History  . Marital status: Single    Spouse name: N/A  . Number of children: N/A  . Years of education: N/A   Social History Main Topics  . Smoking status: Never Smoker  . Smokeless tobacco: Never Used  . Alcohol use 43.2 oz/week    72 Cans of beer per week     Comment: 3 cases daily  . Drug use: No  . Sexual activity: Yes    Birth control/ protection: Condom   Other Topics Concern  . None   Social History Narrative  . None   Additional Social History:    Pain Medications: none Prescriptions: none Over the Counter: none History of alcohol /  drug use?: No history of alcohol / drug abuse Longest period of sobriety (when/how long): 8 months -- when she was in prison Negative Consequences of Use: Financial Withdrawal Symptoms: Diarrhea, Tremors, Nausea / Vomiting, Agitation Name of Substance 1: Alcohol 1 - Age of First Use: 13 1 - Amount (size/oz): Varied 1 - Frequency: Daily 1 - Duration: Ongoing 1 - Last Use / Amount: 04/06/16  Sleep: Fair, but improving   Appetite:  Fair  Current Medications: Current Facility-Administered Medications   Medication Dose Route Frequency Provider Last Rate Last Dose  . alum & mag hydroxide-simeth (MAALOX/MYLANTA) 200-200-20 MG/5ML suspension 30 mL  30 mL Oral Q4H PRN Niel Hummer, NP      . gabapentin (NEURONTIN) capsule 200 mg  200 mg Oral TID Jenne Campus, MD   200 mg at 04/10/16 1210  . ibuprofen (ADVIL,MOTRIN) tablet 600 mg  600 mg Oral Q6H PRN Derrill Center, NP   600 mg at 04/10/16 0836  . levothyroxine (SYNTHROID, LEVOTHROID) tablet 75 mcg  75 mcg Oral QAC breakfast Niel Hummer, NP   75 mcg at 04/10/16 0636  . loperamide (IMODIUM) capsule 2-4 mg  2-4 mg Oral PRN Norman Clay, MD   2 mg at 04/10/16 1312  . LORazepam (ATIVAN) tablet 1 mg  1 mg Oral QID Jenne Campus, MD   1 mg at 04/10/16 1212   Followed by  . [START ON 04/11/2016] LORazepam (ATIVAN) tablet 1 mg  1 mg Oral TID Jenne Campus, MD       Followed by  . [START ON 04/12/2016] LORazepam (ATIVAN) tablet 1 mg  1 mg Oral BID Jenne Campus, MD       Followed by  . [START ON 04/13/2016] LORazepam (ATIVAN) tablet 1 mg  1 mg Oral Daily Fernando A Cobos, MD      . magnesium hydroxide (MILK OF MAGNESIA) suspension 30 mL  30 mL Oral Daily PRN Niel Hummer, NP      . multivitamin with minerals tablet 1 tablet  1 tablet Oral Daily Niel Hummer, NP   1 tablet at 04/10/16 [id number]  . nicotine (NICODERM CQ - dosed in mg/24 hours) patch 21 mg  21 mg Transdermal Daily Derrill Center, NP   21 mg at 04/10/16 0834  . PARoxetine (PAXIL) tablet 20 mg  20 mg Oral Daily Niel Hummer, NP   20 mg at 04/10/16 0834  . thiamine (VITAMIN B-1) tablet 100 mg  100 mg Oral Daily Niel Hummer, NP   100 mg at 04/10/16 0834  . traZODone (DESYREL) tablet 50 mg  50 mg Oral QHS PRN,MR X 1 Niel Hummer, NP   50 mg at 04/09/16 2140    Lab Results:  No results found for this or any previous visit (from the past 48 hour(s)).  Blood Alcohol level:  Lab Results  Component Value Date   ETH 53 (H) 03/13/2013   ETH 413 (HH) 46/27/0350   Metabolic Disorder  Labs: No results found for: HGBA1C, MPG No results found for: PROLACTIN No results found for: CHOL, TRIG, HDL, CHOLHDL, VLDL, LDLCALC  Physical Findings: AIMS:  , ,  ,  ,    CIWA:  CIWA-Ar Total: 6 COWS:     Musculoskeletal: Strength & Muscle Tone: within normal limits Gait & Station: normal Patient leans: N/A  Psychiatric Specialty Exam: Physical Exam  Vitals reviewed. Constitutional: She is oriented to person, place, and time.  HENT:  Head:  Normocephalic.  Cardiovascular: Normal rate.   Musculoskeletal: Normal range of motion.  Neurological: She is alert and oriented to person, place, and time.  Psychiatric: She has a normal mood and affect. Her behavior is normal.    Review of Systems  Constitutional: Positive for malaise/fatigue.  Musculoskeletal: Positive for joint pain.  Neurological: Positive for tremors.  Psychiatric/Behavioral: Positive for depression and substance abuse. The patient is nervous/anxious and has insomnia.   nausea, vomiting, diarrhea  Blood pressure 96/63, pulse 80, temperature 97.9 F (36.6 C), temperature source Oral, resp. rate 20, height _0  (1.626 m), weight 62.6 kg (138 lb).Body mass index is 23.69 kg/m.  General Appearance: Fairly Groomed  Eye Contact:  Fair  Speech:  Normal Rate  Volume:  Normal  Mood:  Anxious and Depressed  Affect:  Labile  Thought Process:  Linear  Orientation:  Full (Time, Place, and Person)  Thought Content:  Describes auditory hallucinations, " just random voices ", at this time not internally preoccupied, no delusions expressed   Suicidal Thoughts:  No denies suicidal or self injurious ideations at present, and contracts for safety on the unit   Homicidal Thoughts:  No   Memory:  Immediate;   Fair Recent;   Fair Remote;   Fair  Judgement:  Fair   Insight:  Lacking  Psychomotor Activity: restless, tremulous   Concentration:  Concentration: Fair and Attention Span: Fair  Recall:  [employer] of Knowledge:   Good  Language:  Good  Akathisia:  No  Handed:  Right  AIMS (if indicated):     Assets:  Desire for Improvement Resilience Social Support  ADL's:  Intact  Cognition:  WNL  Sleep:  Number of Hours: 6.25   Assessment- Patient reports ongoing depression, but no SI. She reports decreasing hallucinations, and does not appear to be internally preoccupied. Describes ongoing symptoms of alcohol WDL, and feels they are worsening as Ativan dose decreases as per protocol. She does present  Tremulous, somewhat diaphoretic, restless . Vitals are stable.   Treatment Plan Summary: Daily contact with patient to assess and evaluate symptoms and progress in treatment and Medication management Continue Neurontin to 200  mg PO TID for agitation. Continue  Paxil 20 mg  QDAY for depression/anxiety  Continue Trazodone 50 mg QHS PRN for insomnia Continue Synthroid for history of Hypothyroidism  Would restart CIWA protocol with Ativan- to minimize risk of severe alcohol WDL and address ongoing symptoms of WDL .  Encourage patient to follow up with outpatient MD, provider, GI specialist to follow up on Hep C positive status. Nursing to offer & encourage fluids. Treatment team working on disposition options Will check CBC, BMP, TSH   Encarnacion Slates, NP, PMHNP, FNP-BC 04/10/2016, 2:05 PM

## 2016-04-10 NOTE — Progress Notes (Signed)
Recreation Therapy Notes  Animal-Assisted Activity (AAA) Program Checklist/Progress Notes Patient Eligibility Criteria Checklist & Daily Group note for Rec TxIntervention  Date: 08.01.2017 Time: 2:45pm Location: [address]    AAA/T Program Assumption of Risk Form signed by Patient/ or Parent Legal Guardian Patient refused to sign.   Behavioral Response: Did not attend.   Laureen Ochs Jeromie Gainor, LRT/CTRS  Lane Hacker 04/10/2016 3:54 PM

## 2016-04-10 NOTE — Progress Notes (Signed)
Patient ID: Dawn Foley, female   DOB: [birth year], 30 y.o.   MRN: [id number]  Writer contacted Infection Prevention, namely Chester Holstein., to discuss patient's active diarrhea/ watery stools. Per Mickel Baas, a provider will have to assess patient. Mickel Baas was made aware that patient is actively withdrawaling/detoxing from ETOH. Per patient it is normal for her to have diarrhea, abdominal cramping, and N/V during time of withdrawal. Patient states, "you don't know how many times I've shit on myself in public." It is of note that patient has not had any fever during this admission per flow sheets.Writer will discuss this with a provider to see if precautions are necessary at this time.

## 2016-04-10 NOTE — [facility] Group Notes (Signed)
Patient attend group. Patient will not rate her day or say what her goal was. I asked when she came what did she tell the admission nurse she want to work on while she's here. Dawn Foley said she she want to detox from achol.

## 2016-04-10 NOTE — Progress Notes (Addendum)
Patient ID: Dawn Foley, female   DOB: [birth year], 30 y.o.   MRN: [id number] D: Client visible on the unit, seen on the phone and interacting with peers. Client reports anxiety "10" of 10. Client adamant about going home, but when asked about discharge plan she reports "I don't want to talk about it"  A: Writer provided emotional support, reviewed medications, administered as ordered. Staff will monitor q22min for safety. R: client is safe on the unit, attended group.

## 2016-04-10 NOTE — [facility] Group Notes (Signed)
Patient told other patients in group room watch this, "I want some nacho and hot cheese as a snack." I explain, I looked in the cabinets and I am sorry but we donot have nachos and cheese, patient ask for popcorn I explain that is the first snack to get eatten. There is no popcorn. Patient said she has some the other day. I explain we do not have popcorn.

## 2016-04-10 NOTE — Progress Notes (Signed)
Pt has been in the dayroom talking with a female peer most of the evening.  She attended evening group.  She is still saying that she is passive SI and hearing voices.  She is also saying that she is still having moderate withdrawal symptoms, but pt is not having any visible signs of withdrawal at this time.  After group time, pt came to writer stating that she was feeling better and requested a sandwich from the cafeteria as she had not been able to eat during the day.  Pt had made this request last night right after telling writer that she had vomited.  Pt was quick to say that she did not have any nausea at this time.  Writer spoke to the [facility] who agreed we would give the pt a sandwich, but informed her that she needed to eat at meal times as it seems pt is being manipulative to get a sandwich during snack time.  Pt says she wants to stay two more weeks.  Support and encouragement offered.  Discharge plans are in process.  Safety maintained with q15 minute checks.

## 2016-04-11 LAB — CBC WITH DIFFERENTIAL/PLATELET
BASOS PCT: 1 %
Basophils Absolute: 0 10*3/uL (ref 0.0–0.1)
EOS PCT: 2 %
Eosinophils Absolute: 0.1 10*3/uL (ref 0.0–0.7)
HEMATOCRIT: 38.4 % (ref 36.0–46.0)
HEMOGLOBIN: 12.3 g/dL (ref 12.0–15.0)
LYMPHS PCT: 36 %
Lymphs Abs: 1.1 10*3/uL (ref 0.7–4.0)
MCH: 29.1 pg (ref 26.0–34.0)
MCHC: 32 g/dL (ref 30.0–36.0)
MCV: 91 fL (ref 78.0–100.0)
MONOS PCT: 21 %
Monocytes Absolute: 0.7 10*3/uL (ref 0.1–1.0)
NEUTROS PCT: 40 %
Neutro Abs: 1.2 10*3/uL — ABNORMAL LOW (ref 1.7–7.7)
Platelets: 92 10*3/uL — ABNORMAL LOW (ref 150–400)
RBC: 4.22 MIL/uL (ref 3.87–5.11)
RDW: 16.5 % — AB (ref 11.5–15.5)
WBC: 3.1 10*3/uL — AB (ref 4.0–10.5)

## 2016-04-11 NOTE — Progress Notes (Signed)
Recreation Therapy Notes  Date: 04/11/16 Time: 0930 Location: [address] Group Room  Group Topic: Stress Management  Goal Area(s) Addresses:  Patient will verbalize importance of using healthy stress management.  Patient will identify positive emotions associated with healthy stress management.   Behavioral Response: Engaged  Intervention: Stress Management   Activity :  Progressive Muscle Relaxation.  LRT introduced the technique of progressive muscle relaxation to patients.  Patients were asked to follow along with LRT as a script was read to guide patients through the activity.  Education:  Stress Management, Discharge Planning.   Education Outcome: Acknowledges edcuation/In group clarification offered/Needs additional education  Clinical Observations/Feedback: Pt was engaged and attentive in group.  Pt stated that she enjoyed the group.   Victorino Sparrow, LRT/CTRS

## 2016-04-11 NOTE — [facility] Group Notes (Signed)
[place] LCSW Group Therapy  04/11/2016 3:34 PM  Type of Therapy:  Group Therapy  Participation Level:  Did Not Attend-pt invited. Chose to remain in bed.   Summary of Progress/Problems: Today's Topic: Overcoming Obstacles. Patients identified one short term goal and potential obstacles in reaching this goal. Patients processed barriers involved in overcoming these obstacles. Patients identified steps necessary for overcoming these obstacles and explored motivation (internal and external) for facing these difficulties head on.   Smart, Farooq Petrovich LCSW 04/11/2016, 3:34 PM

## 2016-04-11 NOTE — Progress Notes (Signed)
Patient ID: Dawn Foley, female   DOB: 13-Jul-1986, 30 y.o.   MRN: [id number] [facility] MD Progress Note  04/11/2016 2:53 PM Dawn Foley  MRN:  [id number]  Subjective: Patient reports feeling better today, which she attributes to her alcohol withdrawal symptoms being much improved. She states " I am never going to drink again, the withdrawal is terrible". She denies medication side effects. As she improves she is becoming more focused on disposition planning options- she states she is not interested in going to a Rehab setting at this time- wants to go live with grandmother and to [facility] for outpatient treatment . Denies medication side effects.   Objective: I have discussed case with treatment team and have met with patient. Patient presents with improving mood and range of affect, less labile, less anxious. At this time no tremors, no diaphoresis, and reports she is no longer nauseous nor presenting with dry heaving, which she had reported as severe symptom earlier this week. Denies medication side effects at this time. As noted, hoping to discharge soon. No current symptoms of alcohol withdrawal . No tremors, no diaphoresis, no psychomotor restlessness , vitals stable. We have reviewed labs- of note, WBC low- 2.7 , with low ANC  1.3. TSH elevated- patient states she has hypothyroidism and is currently on Synthroid.  Principal Problem: Major depressive disorder, recurrent episode with melancholic features ([place]) Diagnosis:   Patient Active Problem List   Diagnosis Date Noted  . Alcohol use disorder, moderate, dependence ([place]) [F10.20] 04/07/2016  . Major depressive disorder, recurrent episode with melancholic features ([place]) [H43.8] 04/07/2016  . Alcohol dependence ([place]) [F10.20] 05/14/2012   Total Time spent with patient: 25 minutes   Past Psychiatric History:  See Above  Past Medical History:  Past Medical History:  Diagnosis Date  . Alcoholism ([place])   . Depression   . Hepatitis      Past Surgical History:  Procedure Laterality Date  . NO PAST SURGERIES     Family History: History reviewed. No pertinent family history. Family Psychiatric  History: See HPI Social History:  History  Alcohol Use  . 43.2 oz/week  . 30 Cans of beer per week    Comment: 3 cases daily     History  Drug Use No    Social History   Social History  . Marital status: Single    Spouse name: N/A  . Number of children: N/A  . Years of education: N/A   Social History Main Topics  . Smoking status: Never Smoker  . Smokeless tobacco: Never Used  . Alcohol use 43.2 oz/week    72 Cans of beer per week     Comment: 3 cases daily  . Drug use: No  . Sexual activity: Yes    Birth control/ protection: Condom   Other Topics Concern  . None   Social History Narrative  . None   Additional Social History:    Pain Medications: none Prescriptions: none Over the Counter: none History of alcohol / drug use?: No history of alcohol / drug abuse Longest period of sobriety (when/how long): 8 months -- when she was in prison Negative Consequences of Use: Financial Withdrawal Symptoms: Diarrhea, Tremors, Nausea / Vomiting, Agitation Name of Substance 1: Alcohol 1 - Age of First Use: 13 1 - Amount (size/oz): Varied 1 - Frequency: Daily 1 - Duration: Ongoing 1 - Last Use / Amount: 04/06/16  Sleep: Fair, but improving   Appetite:  Fair  Current Medications: Current Facility-Administered  Medications  Medication Dose Route Frequency Provider Last Rate Last Dose  . alum & mag hydroxide-simeth (MAALOX/MYLANTA) 200-200-20 MG/5ML suspension 30 mL  30 mL Oral Q4H PRN Niel Hummer, NP      . gabapentin (NEURONTIN) capsule 200 mg  200 mg Oral TID Jenne Campus, MD   200 mg at 04/11/16 1208  . hydrOXYzine (ATARAX/VISTARIL) tablet 25 mg  25 mg Oral Q6H PRN Laverle Hobby, PA-C   25 mg at 04/10/16 2102  . ibuprofen (ADVIL,MOTRIN) tablet 600 mg  600 mg Oral Q6H PRN Derrill Center, NP   600 mg  at 04/10/16 0836  . levothyroxine (SYNTHROID, LEVOTHROID) tablet 75 mcg  75 mcg Oral QAC breakfast Niel Hummer, NP   75 mcg at 04/11/16 2993  . loperamide (IMODIUM) capsule 2-4 mg  2-4 mg Oral PRN Norman Clay, MD   2 mg at 04/10/16 1312  . LORazepam (ATIVAN) tablet 1 mg  1 mg Oral TID Jenne Campus, MD   1 mg at 04/11/16 1208   Followed by  . [START ON 04/12/2016] LORazepam (ATIVAN) tablet 1 mg  1 mg Oral BID Jenne Campus, MD       Followed by  . [START ON 04/13/2016] LORazepam (ATIVAN) tablet 1 mg  1 mg Oral Daily Juanette Urizar A Raissa Dam, MD      . magnesium hydroxide (MILK OF MAGNESIA) suspension 30 mL  30 mL Oral Daily PRN Niel Hummer, NP      . multivitamin with minerals tablet 1 tablet  1 tablet Oral Daily Niel Hummer, NP   1 tablet at 04/11/16 0755  . nicotine (NICODERM CQ - dosed in mg/24 hours) patch 21 mg  21 mg Transdermal Daily Derrill Center, NP   21 mg at 04/11/16 0758  . PARoxetine (PAXIL) tablet 20 mg  20 mg Oral Daily Niel Hummer, NP   20 mg at 04/11/16 0754  . thiamine (VITAMIN B-1) tablet 100 mg  100 mg Oral Daily Niel Hummer, NP   100 mg at 04/11/16 0755  . traZODone (DESYREL) tablet 50 mg  50 mg Oral QHS PRN,MR X 1 Niel Hummer, NP   50 mg at 04/10/16 2247    Lab Results:  Results for orders placed or performed during the hospital encounter of 04/07/16 (from the past 48 hour(s))  Basic metabolic panel     Status: Abnormal   Collection Time: 04/10/16  6:23 PM  Result Value Ref Range   Sodium 134 (L) 135 - 145 mmol/L   Potassium 4.4 3.5 - 5.1 mmol/L   Chloride 100 (L) 101 - 111 mmol/L   CO2 28 22 - 32 mmol/L   Glucose, Bld 128 (H) 65 - 99 mg/dL   BUN 6 6 - 20 mg/dL   Creatinine, Ser 0.69 0.44 - 1.00 mg/dL   Calcium 9.4 8.9 - 10.3 mg/dL   GFR calc non Af Amer >60 >60 mL/min   GFR calc Af Amer >60 >60 mL/min    Comment: (NOTE) The eGFR has been calculated using the CKD EPI equation. This calculation has not been validated in all clinical situations. eGFR's  persistently <60 mL/min signify possible Chronic Kidney Disease.    Anion gap 6 5 - 15    Comment: Performed at [facility]  CBC with Differential/Platelet     Status: Abnormal   Collection Time: 04/10/16  6:23 PM  Result Value Ref Range   WBC 2.7 (L) 4.0 -  10.5 K/uL   RBC 4.23 3.87 - 5.11 MIL/uL   Hemoglobin 12.4 12.0 - 15.0 g/dL   HCT 37.6 36.0 - 46.0 %   MCV 88.9 78.0 - 100.0 fL   MCH 29.3 26.0 - 34.0 pg   MCHC 33.0 30.0 - 36.0 g/dL   RDW 16.1 (H) 11.5 - 15.5 %   Platelets  150 - 400 K/uL    PLATELET CLUMPS NOTED ON SMEAR, COUNT APPEARS DECREASED   Neutrophils Relative % 47 %   Neutro Abs 1.3 (L) 1.7 - 7.7 K/uL   Lymphocytes Relative 32 %   Lymphs Abs 0.9 0.7 - 4.0 K/uL   Monocytes Relative 18 %   Monocytes Absolute 0.5 0.1 - 1.0 K/uL   Eosinophils Relative 3 %   Eosinophils Absolute 0.1 0.0 - 0.7 K/uL   Basophils Relative 0 %   Basophils Absolute 0.0 0.0 - 0.1 K/uL    Comment: Performed at [facility]  TSH     Status: Abnormal   Collection Time: 04/10/16  6:23 PM  Result Value Ref Range   TSH 7.260 (H) 0.350 - 4.500 uIU/mL    Comment: Performed at [facility]    Blood Alcohol level:  Lab Results  Component Value Date   ETH 53 (H) 03/13/2013   ETH 413 ([place]) 29/52/8413   Metabolic Disorder Labs: No results found for: HGBA1C, MPG No results found for: PROLACTIN No results found for: CHOL, TRIG, HDL, CHOLHDL, VLDL, LDLCALC  Physical Findings: AIMS:  , ,  ,  ,    CIWA:  CIWA-Ar Total: 2 COWS:     Musculoskeletal: Strength & Muscle Tone: within normal limits Gait & Station: normal Patient leans: N/A  Psychiatric Specialty Exam: Physical Exam  Vitals reviewed. Constitutional: She is oriented to person, place, and time.  HENT:  Head: Normocephalic.  Cardiovascular: Normal rate.   Musculoskeletal: Normal range of motion.  Neurological: She is alert and oriented to person, place, and time.   Psychiatric: She has a normal mood and affect. Her behavior is normal.    Review of Systems  Constitutional: Positive for malaise/fatigue.  Musculoskeletal: Positive for joint pain.  Neurological: Positive for tremors.  Psychiatric/Behavioral: Positive for depression and substance abuse. The patient is nervous/anxious and has insomnia.   nausea, vomiting, diarrhea  Blood pressure 100/61, pulse 71, temperature 98 F (36.7 C), temperature source Oral, resp. rate 20, height 5' 4"  (1.626 m), weight 138 lb (62.6 kg).Body mass index is 23.69 kg/m.  General Appearance:  Improved grooming   Eye Contact:  good  Speech:  Normal Rate  Volume:  Normal  Mood: improved, denies feeling depressed at this time   Affect:  Improved but still anxious, vaguely labile   Thought Process:  Linear  Orientation:  Full (Time, Place, and Person)  Thought Content:  At this time does not endorse hallucinations, no delusions are expressed, and does not appear internally preoccupied   Suicidal Thoughts:  No denies suicidal or self injurious ideations at present, and contracts for safety on the unit   Homicidal Thoughts:  No  Denies any homicidal or violent ideations   Memory:  Recent and remote grossly intact   Judgement:  Improving   Insight:  Improving   Psychomotor Activity:  Normal   Concentration:  Concentration: Good and Attention Span: Good  Recall:  Good  Fund of Knowledge:  Good  Language:  Good  Akathisia:  No  Handed:  Right  AIMS (if indicated):  Assets:  Desire for Improvement Resilience Social Support  ADL's:  Intact  Cognition:  WNL  Sleep:  Number of Hours: 6.25   Assessment- Patient reports her mood is improved today and at this time minimizes depression. Attributes improvement to significant improvement /resolution of her alcohol WDL symptoms, which had been severe . At this time tolerating Neurontin and Paxil well , no side effects. Tolerating Ativan detox protocol well . Of note, WBC  low, patient denies any known history of leukopenia- no fever, no chills .   Treatment Plan Summary: Daily contact with patient to assess and evaluate symptoms and progress in treatment and Medication management Continue Neurontin to 200  mg PO TID for agitation. Continue  Paxil 20 mg  QDAY for depression/anxiety  Continue Trazodone 50 mg QHS PRN for insomnia Continue Synthroid for history of Hypothyroidism  Continue Ativan CIWA protocol - to address alcohol WDL  Repeat CBC  Treatment team working on disposition options Patient encouraged to go to 12 step meetings after discharge   Neita Garnet, MD 04/11/2016, 2:53 PM

## 2016-04-11 NOTE — Progress Notes (Signed)
Patient ID: Dawn Foley, female   DOB: Apr 24, 1986, 30 y.o.   MRN: [id number] D: Client visible on the unit, concern about labs (WBC count). "I have a history of colon cancer" "it wouldn't be coming back would it" Client reports of her day "better, a lot better" "I'm not detoxxing that bad and I'm eating, hadn't thrown up today" A: Writer provided emotional support encouraged client to follow up with physician about labs. Medications reviewed, administered as ordered. Staff will monitor q31min for safety. R: Client is safe on the unit, attended group.

## 2016-04-11 NOTE — Progress Notes (Signed)
D: Dawn Foley denied SI/HI/AVH and symptoms of withdrawal, except mild anxiety, which she rated 1/10 in the a.m.. On her self inventory, she rated her feeling of hopelessness and depression zero out of 10. She became more anxious and slightly tearful after learning from Dr. Parke Poisson that her WBC was low and would be redrawn tonight. She told this Probation officer that she is a cancer survivor and she worried that her cancer has returned.  A: Meds given as ordered. Q15 safety checks maintained. Support/encouragement offered. Tried to encourage her to not worry about the WBC draw.   R: Pt remains free from harm and continues with treatment. Will continue to monitor for needs/safety.

## 2016-04-11 NOTE — Progress Notes (Signed)
Adult Psychoeducational Group Note  Date:  04/11/2016 Time:  12:11 PM  Group Topic/Focus:  Goals Group:   The focus of this group is to help patients establish daily goals to achieve during treatment and discuss how the patient can incorporate goal setting into their daily lives to aide in recovery.   Participation Level:  Minimal  Participation Quality:  Appropriate  Affect:  Appropriate  Cognitive:  Appropriate  Insight: Appropriate  Engagement in Group:  Engaged  Modes of Intervention:  Discussion  Additional Comments:  Pt stated her goal for the day was to talk to a Education officer, museum and psychiatrist. Tonia Brooms D 04/11/2016, 12:11 PM

## 2016-04-11 NOTE — Progress Notes (Signed)
Pt attended NA group this evening.  

## 2016-04-12 MED ORDER — NICOTINE 21 MG/24HR TD PT24: 21.0000 mg | MEDICATED_PATCH | Freq: Every day | TRANSDERMAL | 0 refills | Status: DC

## 2016-04-12 MED ORDER — GABAPENTIN 300 MG PO CAPS: 300.0000 mg | ORAL_CAPSULE | Freq: Three times a day (TID) | ORAL | 0 refills | Status: DC

## 2016-04-12 MED ORDER — IBUPROFEN 200 MG PO TABS: 200.0000 mg | ORAL_TABLET | Freq: Four times a day (QID) | ORAL | 0 refills | Status: DC | PRN

## 2016-04-12 MED ORDER — TRAZODONE HCL 50 MG PO TABS: ORAL_TABLET | ORAL | 0 refills | Status: DC

## 2016-04-12 MED ORDER — GABAPENTIN 300 MG PO CAPS
300.0000 mg | ORAL_CAPSULE | Freq: Three times a day (TID) | ORAL | Status: DC
  Filled 2016-04-12 (×6): qty 1

## 2016-04-12 MED ORDER — HYDROXYZINE HCL 25 MG PO TABS: ORAL_TABLET | ORAL | 0 refills | Status: DC

## 2016-04-12 MED ORDER — LEVOTHYROXINE SODIUM 75 MCG PO TABS: 75.0000 ug | ORAL_TABLET | Freq: Every day | ORAL | Status: DC

## 2016-04-12 MED ORDER — PAROXETINE HCL 20 MG PO TABS: 20.0000 mg | ORAL_TABLET | Freq: Every day | ORAL | 0 refills | Status: DC

## 2016-04-12 NOTE — BHH Group Notes (Signed)
[place] Group Notes:  (Nursing/MHT/Case Management/Adjunct)  Date:  04/12/2016  Time:  0900  Type of Therapy:  Nurse Education  Participation Level:  Active  Participation Quality:  Appropriate and Sharing  Affect:  Appropriate  Cognitive:  Alert, Appropriate and Oriented  Insight:  Improving  Engagement in Group:  Engaged  Modes of Intervention:  Discussion, Socialization and Support  Summary of Progress/Problems:Pt reports that she has been cancer free for 8 years and is waiting on recent test results. Pt reports that she has completed detox, feels better and wants to leave. Pt's goal is to talk with the MD and discuss d/c planning.   Mosie Lukes 04/12/2016, 9:45 AM

## 2016-04-12 NOTE — BHH Suicide Risk Assessment (Signed)
[facility] Discharge Suicide Risk Assessment   Principal Problem: Major depressive disorder, recurrent episode with melancholic features [facility]) Discharge Diagnoses:  Patient Active Problem List   Diagnosis Date Noted  . Alcohol use disorder, moderate, dependence ([place]) [F10.20] 04/07/2016  . Major depressive disorder, recurrent episode with melancholic features ([place]) 0000000 04/07/2016  . Alcohol dependence ([place]) [F10.20] 05/14/2012    Total Time spent with patient: 20 minutes  Musculoskeletal: Strength & Muscle Tone: within normal limits Gait & Station: normal Patient leans: N/A  Psychiatric Specialty Exam: ROS  Blood pressure (!) 113/98, pulse 79, temperature 98.5 F (36.9 C), temperature source Oral, resp. rate 16, height 5\' 4"  (1.626 m), weight 138 lb (62.6 kg).Body mass index is 23.69 kg/m.  General Appearance: Casual  Eye Contact::  Good  Speech:  Normal Rate409  Volume:  Normal  Mood:  excited to be discharged  Affect:  slightly anxious but calm, reactive, appropriate to the context  Thought Process:  Coherent and Goal Directed  Orientation:  Full (Time, Place, and Person)  Thought Content:  Logical  Suicidal Thoughts:  No  Homicidal Thoughts:  No  Memory:  intact  Judgement:  Fair  Insight:  Fair  Psychomotor Activity:  Normal  Concentration:  Good  Recall:  [place] of Knowledge:Fair  Language: Fair  Akathisia:  No  Handed:  Ambidextrous  AIMS (if indicated):     Assets:  Desire for Improvement Social Support  Sleep:  Number of Hours: 5.75  Cognition: WNL  ADL's:  Intact   Mental Status Per Nursing Assessment::   On Admission:  NA  Demographic Factors:  Caucasian  Loss Factors: NA  Historical Factors: NA  Risk Reduction Factors:   Responsible for children under 70 years of age  Continued Clinical Symptoms:  Severe Anxiety and/or Agitation Alcohol/Substance Abuse/Dependencies  Cognitive Features That Contribute To Risk:  None    Suicide Risk:   Mild:  Suicidal ideation of limited frequency, intensity, duration, and specificity.  There are no identifiable plans, no associated intent, mild dysphoria and related symptoms, good self-control (both objective and subjective assessment), few other risk factors, and identifiable protective factors, including available and accessible social support.  Follow-up Information    [facility] .   Why:  Referral faxed: 04/10/16 Contact information: [address] [place], [place] [address] Phone: [phone] Fax: [phone]          Plan Of Care/Follow-up recommendations:  Activity:  full Diet:  regular Tests:  none Other:  Continue meds; paroxetine, quetiapine, carbamazepine. Increased gabapentin for alcohol withdrwal/abstinence and anxiety Ms. Cheong denies SI/history and reports she reported SI to come help for her detox. She is motivated for sobriety and will be discharged to her grandmother's place. She has two children who she visits frequently. She endorses mild anxiety but denies any withdrawal symptoms today; informed the symptoms of withdrawal and advised to come to medical facility if those worsen.   Norman Clay, MD 04/12/2016, 10:16 AM

## 2016-04-12 NOTE — Progress Notes (Signed)
D:Pt reports that she is having no detox symptoms this morning and is wanting to be discharged. Pt reported that she was having problems with double vision this morning and is scheduled to talk with the MD. A:Offered support, encouragement and 15 minute checks. R:Pt denies si and hi. Safety maintained on the unit.

## 2016-04-12 NOTE — Plan of Care (Signed)
Problem: Safety: Goal: Periods of time without injury will increase Outcome: Progressing Client is safe on the unit, AEB q45min safety checks and denial of harmful thoughts.

## 2016-04-12 NOTE — Tx Team (Signed)
Interdisciplinary Treatment Plan Update (Adult)  Date:  04/12/2016  Time Reviewed:  10:26 AM   Progress in Treatment: Attending groups: Yes. Participating in groups:  Yes. Taking medication as prescribed:  Yes. Tolerating medication:  Yes. Family/Significant othe contact made:  SPE completed with pt; pt declined to consent to family contact.  Patient understands diagnosis:  Yes. and As evidenced by:  seeking treatment for SI with a plan, depression/grief issues, alcohol abuse, and for medication stabilization. Discussing patient identified problems/goals with staff:  Yes. Medical problems stabilized or resolved:  Yes. Denies suicidal/homicidal ideation: Yes. Issues/concerns per patient self-inventory:  Other:  Discharge Plan or Barriers: Pt referred to [facility] but then requested outpatient services when she found out that her grandmother was willing to take her in. Pt has appt tomorrow, 04/13/16 at 1pm at [facility] in [place].   Reason for Continuation of Hospitalization: none  Comments:  Dawn Foley is an 30 y.o. female who endorses SI w/Many plans, including sticking a gun to her head and slitting her wrists.  Pt states she's been having SI for about 3 weeks, ever since her "paw-paw" died.  Pt also endorsed having auditory hallucinations, not related to her alcoholism, basically all her life.  Pt denies the voices are commanding and she's been successful in ignoring them.  Pt admits to not eating in 11 days and not sleeping in about as many days, due to being a "professional alcoholic."  Pt also admits to not attending to her personal hygiene and stated that she doesn't plan on doing so while she is "detoxing."Pt indicates she's been drinking since she was 30 years old, but realized it to be a problem when she was 24 and started drinking daily.  Pt has completed treatment at [facility] in the past.  Pt reports 8 months as the longest time she has been sober, when she was in prison.  Pt reports no  current legal issues.Diagnosis: Major Depressive Disorder, Severe, with Psychotic features; Bipolar Disorder; r/o Alcohol-induced mood disorder; r/o PTSD (per report)  Estimated length of stay:  D/c today   Additional Comments:  Patient and CSW reviewed pt's identified goals and treatment plan. Patient verbalized understanding and agreed to treatment plan. CSW reviewed [facility] "Discharge Process and Patient Involvement" Form. Pt verbalized understanding of information provided and signed form.    Review of initial/current patient goals per problem list:  1. Goal(s): Patient will participate in aftercare plan  Met: Yes   Target date: at discharge  As evidenced by: Patient will participate within aftercare plan AEB aftercare provider and housing plan at discharge being identified.  7/31: CSW assessing for appropriate referrals.   8/3: Pt will stay with her grandmother at discharge and has follow-up appt scheduled with [facility]. Referred to [facility] but then declined bed.   2. Goal (s): Patient will exhibit decreased depressive symptoms and suicidal ideations.  Met: Yes   Target date: at discharge  As evidenced by: Patient will utilize self rating of depression at 3 or below and demonstrate decreased signs of depression or be deemed stable for discharge by MD.  7/31: Pt rates depression as high; passive SI reported at times. Pt is able to contract for safety on the unit.   8/3: Pt rates depression as 1/10 and presents with pleasant mood/calm affect. She denies SI/HI/AVh and reports that she is ready to d/c today.   3. Goal(s): Patient will demonstrate decreased signs of withdrawal due to substance abuse  Met:Yes  Target date:at  discharge   As evidenced by: Patient will produce a CIWA/COWS score of 0, have stable vitals signs, and no symptoms of withdrawal.  7/31: Pt reports mild/moderate withdrawals with CIWA score of 4 and low sitting BP. All other vitals are stable.    8/3: Pt reports no signs of withdrawal today with CIWA score of 1 and high standing BP. All other vitals are stable. Per MD, pt is medically stable for discharge.   Attendees: Patient:   04/12/2016 10:26 AM   Family:   04/12/2016 10:26 AM   Physician:  Dr. Parke Poisson; Dr. Elie Goody MD 04/12/2016 10:26 AM   Nursing:   Bayard Hugger RN; Jan RN 04/12/2016 10:26 AM   Clinical Social Worker: Maxie Better, LCSW 04/12/2016 10:26 AM   Clinical Social Worker: Erasmo Downer Drinkard LCSW 04/12/2016 10:26 AM   Other:  Gerline Legacy Nurse Case Manager 04/12/2016 10:26 AM   Other:  Agustina Caroli NP 04/12/2016 10:26 AM   Other:   04/12/2016 10:26 AM   Other:  04/12/2016 10:26 AM   Other:  04/12/2016 10:26 AM   Other:  04/12/2016 10:26 AM    04/12/2016 10:26 AM    04/12/2016 10:26 AM    04/12/2016 10:26 AM    04/12/2016 10:26 AM    Scribe for Treatment Team:   Maxie Better, LCSW 04/12/2016 10:26 AM

## 2016-04-12 NOTE — Progress Notes (Signed)
  [facility] Adult Case Management Discharge Plan :  Will you be returning to the same living situation after discharge:  No--pt plans to go home with her grandmother At discharge, do you have transportation home?: Yes,  family member Do you have the ability to pay for your medications: Yes,  [facility]  Release of information consent forms completed and submitted to medical records by CSW.  Patient to Follow up at: Follow-up Information    [facility] Follow up on 04/13/2016.   Why:  Appt for hospital follow-up on this date at 1:00PM. (medication management and assessment for counseling services).  Contact information: [address] [place], [place]  Phone: [phone] Fax: [phone]          Next level of care provider has access to [place] and Suicide Prevention discussed: Yes,  SPE completed with pt; pt declined to consent to family contact. SPI pamphlet and Mobile Crisis information provided to pt. She was encouraged to share this information with her support network, ask questions, and talk about any concerns relating to SPE.  Have you used any form of tobacco in the last 30 days? (Cigarettes, Smokeless Tobacco, Cigars, and/or Pipes): Patient Refused Screening  Has patient been referred to the Quitline?: N/A patient is not a smoker  Patient has been referred for addiction treatment: Yes  Smart, Veanna Dower LCSW 04/12/2016, 10:24 AM

## 2016-04-12 NOTE — Discharge Summary (Signed)
Physician Discharge Summary Note  Patient:  Dawn Foley is an 30 y.o., female  MRN:  [id number]  DOB:  [birth year]  Patient phone:  [phone] (home)   Patient address:   [place] [address],   Date of Admission:  04/07/2016  Date of Discharge: 04/12/13  Reason for Admission:  Alcohol detox/mood stabilization treatments.  Discharge Diagnoses: Principal Problem:   Major depressive disorder, recurrent episode with melancholic features ([place]) Active Problems:   Alcohol use disorder, moderate, dependence ([place])  Review of Systems  Constitutional: Negative.   HENT: Negative.   Eyes: Negative.   Respiratory: Negative.   Cardiovascular: Negative.   Gastrointestinal: Negative.   Genitourinary: Negative.   Musculoskeletal: Negative.   Skin: Negative.   Neurological: Negative.   Endo/Heme/Allergies: Negative.   Psychiatric/Behavioral: Positive for substance abuse (Alcoholism). Negative for depression, hallucinations, memory loss and suicidal ideas. The patient is nervous/anxious (Stabilized with medication prior to discharge) and has insomnia (Stabilized with medication prior to discharge).    Past Medical History:  Diagnosis Date  . Alcoholism ([place])   . Depression   . Hepatitis    Level of Care:  OP  Hospital Course: Lannah is a 30 y.o.femalewho was assessed via telepsych by Morene Rankins. The following is a copy of her assessment: Pt endorses SI w/Many plans, including sticking a gun to her head and slitting her wrists. Pt states she's been having SI for about 3 weeks, ever since her "paw-paw" died. Pt also endorsed having auditory hallucinations, not related to her alcoholism, basically all her life. Pt denies the voices are commanding and she's been successful in ignoring them. Pt admits to not eating in 11 days and not sleeping in about as many days, due to being a "professional alcoholic." Pt also admits to not attending to her  personal hygiene and stated that she doesn't plan on doing so while she is "detoxing."  Upon her admission in this hospital and after admission assessment/evaluation, it was determined that Ms. Moretto will need detoxification treatment to rid her system off the excess alcohol to combat the withdrawal symptoms as well. She did admit on admission of her chronic alcoholism & how it has affected her life negatively. Apparently, because of alcoholism, Michol has not been attending to her own personal hygiene or welfare. She received Ativan detox protocols. This plan of detoxification treatment gave Ms. Gamarra a much cleaner detoxification treatment without a lingering adverse effects in her system. She has a history of hepatitis C . Per history of lab reports, her liver enzymes were elevated.  Besides the detoxification treatment protocol, Ms. Lindh also received medication management for her mood symptoms. She was medicated & discharged on; Gabapentin 300 mg for agitation, Hydroxyzine 25 mg prn for anxiety, Nicotine patch 21 mg for smoking cessation, Paroxetine 20 mg for depression & Trazodone 50 mg for insomnia. She was enrolled in the group counseling sessions and activities where she was counseled, taught and  learned coping skills that should help her after discharge to cope better and manage her substance abuse issues for a much sustainable sobriety.   Ms. Hach was also enrolled/attended AA/NA meetings being offered and held on this unit. She has other previous or identifiable medical conditions that required treatment and or monitoring. She was resumed on all her pertinent home medications for those health issues. Dorette tolerated her treatment regimen without any significant adverse effects and or reactions reported.  Satsuki met with her provider this am for  follow-up care, her reason for admission, present symptoms, substance abuse issues, response to treatment and discharge plans discussed. Tahiry  endorsed that she is doing well and stable for discharge to pursue the next phase of her substance abuse treatment on an outpatient basis. She will follow-up care on an outpatient basis as noted below.  She was also instructed/encouraged to join/attend AA meetings being offered and held within her community for much needed support from peers who are and had gone through the same experience.  Ms. Cinque is currently being discharged to her home. Upon discharge, She adamantly denies suicidal, homicidal ideations, auditory, visual hallucinations, delusional thinking & or substance withdrawal symptoms. She left [facility] with all personal belongings in no apparent distress. Transportation per family.  Consults:  psychiatry  Discharge Vitals:   Blood pressure (!) 113/98, pulse 79, temperature 98.5 F (36.9 C), temperature source Oral, resp. rate 16, height _0  (1.626 m), weight 62.6 kg (138 lb). Body mass index is 23.69 kg/m.  Lab Results:   Results for orders placed or performed during the hospital encounter of 04/07/16 (from the past 72 hour(s))  Basic metabolic panel     Status: Abnormal   Collection Time: 04/10/16  6:23 PM  Result Value Ref Range   Sodium 134 (L) 135 - 145 mmol/L   Potassium 4.4 3.5 - 5.1 mmol/L   Chloride 100 (L) 101 - 111 mmol/L   CO2 28 22 - 32 mmol/L   Glucose, Bld 128 (H) 65 - 99 mg/dL   BUN 6 6 - 20 mg/dL   Creatinine, Ser 0.69 0.44 - 1.00 mg/dL   Calcium 9.4 8.9 - 10.3 mg/dL   GFR calc non Af Amer >60 >60 mL/min   GFR calc Af Amer >60 >60 mL/min    Comment: (NOTE) The eGFR has been calculated using the CKD EPI equation. This calculation has not been validated in all clinical situations. eGFR's persistently <60 mL/min signify possible Chronic Kidney Disease.    Anion gap 6 5 - 15    Comment: Performed at [facility]  CBC with Differential/Platelet     Status: Abnormal   Collection Time: 04/10/16  6:23 PM  Result Value Ref Range   WBC 2.7  (L) 4.0 - 10.5 K/uL   RBC 4.23 3.87 - 5.11 MIL/uL   Hemoglobin 12.4 12.0 - 15.0 g/dL   HCT 37.6 36.0 - 46.0 %   MCV 88.9 78.0 - 100.0 fL   MCH 29.3 26.0 - 34.0 pg   MCHC 33.0 30.0 - 36.0 g/dL   RDW 16.1 (H) 11.5 - 15.5 %   Platelets  150 - 400 K/uL    PLATELET CLUMPS NOTED ON SMEAR, COUNT APPEARS DECREASED   Neutrophils Relative % 47 %   Neutro Abs 1.3 (L) 1.7 - 7.7 K/uL   Lymphocytes Relative 32 %   Lymphs Abs 0.9 0.7 - 4.0 K/uL   Monocytes Relative 18 %   Monocytes Absolute 0.5 0.1 - 1.0 K/uL   Eosinophils Relative 3 %   Eosinophils Absolute 0.1 0.0 - 0.7 K/uL   Basophils Relative 0 %   Basophils Absolute 0.0 0.0 - 0.1 K/uL    Comment: Performed at [facility]  TSH     Status: Abnormal   Collection Time: 04/10/16  6:23 PM  Result Value Ref Range   TSH 7.260 (H) 0.350 - 4.500 uIU/mL    Comment: Performed at [facility]  CBC with Differential/Platelet  Status: Abnormal   Collection Time: 04/11/16  6:43 PM  Result Value Ref Range   WBC 3.1 (L) 4.0 - 10.5 K/uL   RBC 4.22 3.87 - 5.11 MIL/uL   Hemoglobin 12.3 12.0 - 15.0 g/dL   HCT 38.4 36.0 - 46.0 %   MCV 91.0 78.0 - 100.0 fL   MCH 29.1 26.0 - 34.0 pg   MCHC 32.0 30.0 - 36.0 g/dL   RDW 16.5 (H) 11.5 - 15.5 %   Platelets 92 (L) 150 - 400 K/uL   Neutrophils Relative % 40 %   Lymphocytes Relative 36 %   Monocytes Relative 21 %   Eosinophils Relative 2 %   Basophils Relative 1 %   Neutro Abs 1.2 (L) 1.7 - 7.7 K/uL   Lymphs Abs 1.1 0.7 - 4.0 K/uL   Monocytes Absolute 0.7 0.1 - 1.0 K/uL   Eosinophils Absolute 0.1 0.0 - 0.7 K/uL   Basophils Absolute 0.0 0.0 - 0.1 K/uL   WBC Morphology WHITE COUNT CONFIRMED ON SMEAR    Smear Review PLATELET COUNT CONFIRMED BY SMEAR     Comment: Performed at [facility]   Physical Findings: AIMS:  , ,  ,  ,    CIWA:  CIWA-Ar Total: 1 COWS:     Psychiatric Specialty Exam: See Psychiatric Specialty Exam and Suicide Risk Assessment  completed by Attending Physician prior to discharge.  Discharge destination:  Home  Is patient on multiple antipsychotic therapies at discharge:  No   Has Patient had three or more failed trials of antipsychotic monotherapy by history:  No  Recommended Plan for Multiple Antipsychotic Therapies: NA    Medication List    STOP taking these medications   carbamazepine 200 MG tablet Commonly known as:  TEGRETOL   chlordiazePOXIDE 5 MG capsule Commonly known as:  LIBRIUM   QUEtiapine 200 MG tablet Commonly known as:  SEROQUEL     TAKE these medications     Indication  gabapentin 300 MG capsule Commonly known as:  NEURONTIN Take 1 capsule (300 mg total) by mouth 3 (three) times daily. For agitation  Indication:  Agitation, Alcohol Withdrawal Syndrome   hydrOXYzine 25 MG tablet Commonly known as:  ATARAX/VISTARIL Take 1 tablet (25 mg) four times daily as needed: For anxiety  Indication:  Anxiety Neurosis   ibuprofen 200 MG tablet Commonly known as:  ADVIL,MOTRIN Take 1 tablet (200 mg total) by mouth every 6 (six) hours as needed for moderate pain. What changed:  reasons to take this  Indication:  Mild to Moderate Pain   levothyroxine 75 MCG tablet Commonly known as:  SYNTHROID, LEVOTHROID Take 1 tablet (75 mcg total) by mouth daily before breakfast. For thyroid hormone replacement What changed:  additional instructions  Indication:  Underactive Thyroid   nicotine 21 mg/24hr patch Commonly known as:  NICODERM CQ - dosed in mg/24 hours Place 1 patch (21 mg total) onto the skin daily. For smoking cessation  Indication:  Nicotine Addiction   PARoxetine 20 MG tablet Commonly known as:  PAXIL Take 1 tablet (20 mg total) by mouth daily. For depression What changed:  additional instructions  Indication:  Major Depressive Disorder   traZODone 50 MG tablet Commonly known as:  DESYREL Take 1 tablet (50 mg) at bedtime: For mood control What changed:  how much to  take  how to take this  when to take this  reasons to take this  additional instructions  Indication:  Trouble Sleeping  Follow-up Information    [facility] Follow up on 04/13/2016.   Why:  Appt for hospital follow-up on this date at 1:00PM. (medication management and assessment for counseling services).  Contact information: [address] [place], [place]  Phone: [phone] Fax: [phone]         Follow-up recommendations:  Activity:  As tolerated Diet: As recommended by your primary care doctor. Keep all scheduled follow-up appointments as recommended.   Comments:  Patient is instructed prior to discharge to: Take all medications as prescribed by his/her mental healthcare provider. Report any adverse effects and or reactions from the medicines to his/her outpatient provider promptly. Patient has been instructed & cautioned: To not engage in alcohol and or illegal drug use while on prescription medicines. In the event of worsening symptoms, patient is instructed to call the crisis hotline, 911 and or go to the nearest ED for appropriate evaluation and treatment of symptoms. To follow-up with his/her primary care provider for your other medical issues, concerns and or health care needs.   Signed: Encarnacion Slates, FNP, PMHNP-BC 04/12/2016, 10:43 AM

## 2016-04-12 NOTE — Progress Notes (Signed)
Pt d/c with her boyfriend. All items returned. D/C instructions given and prescriptions given. Pt denies si and hi.

## 2016-04-13 ENCOUNTER — Emergency Department (HOSPITAL_COMMUNITY)
Admission: EM | Admit: 2016-04-13 | Discharge: 2016-04-13 | Discharge status: Against Medical Advice | Payer: Medicaid Other | Attending: Emergency Medicine | Admitting: Emergency Medicine

## 2016-04-13 ENCOUNTER — Encounter (HOSPITAL_COMMUNITY): Payer: Self-pay | Admitting: Emergency Medicine

## 2016-04-13 DIAGNOSIS — Z79899 Other long term (current) drug therapy: Secondary | ICD-10-CM | POA: Insufficient documentation

## 2016-04-13 DIAGNOSIS — N390 Urinary tract infection, site not specified: Secondary | ICD-10-CM | POA: Diagnosis not present

## 2016-04-13 DIAGNOSIS — M545 Low back pain: Secondary | ICD-10-CM

## 2016-04-13 DIAGNOSIS — R339 Retention of urine, unspecified: Secondary | ICD-10-CM

## 2016-04-13 LAB — URINALYSIS, ROUTINE W REFLEX MICROSCOPIC
Bilirubin Urine: NEGATIVE
Glucose, UA: NEGATIVE mg/dL
Hgb urine dipstick: NEGATIVE
Ketones, ur: NEGATIVE mg/dL
Leukocytes, UA: NEGATIVE
NITRITE: NEGATIVE
Protein, ur: NEGATIVE mg/dL
SPECIFIC GRAVITY, URINE: 1.015 (ref 1.005–1.030)
pH: 5.5 (ref 5.0–8.0)

## 2016-04-13 LAB — PREGNANCY, URINE: PREG TEST UR: NEGATIVE

## 2016-04-13 NOTE — ED Notes (Signed)
Pt. Leaving AMA. Both EDP PA and MD spoke with pt. About risks. Pt. Still asking to leave. D/C papers given and pt. Refused to sign AMA form.

## 2016-04-13 NOTE — ED Notes (Signed)
EDP at bedside  

## 2016-04-13 NOTE — ED Provider Notes (Signed)
[facility] DEPT Provider Note   CSN: [id number] Arrival date & time: 04/13/16  [id number]  First Provider Contact:  First MD Initiated Contact with Patient 04/13/16 [phone]        History   Chief Complaint Chief Complaint  Patient presents with  . Urinary Retention    HPI Dawn Foley is a 30 y.o. female.  Patient with PMH of alcoholism, hepatitis, and depression presents to the ED with a chief complaint of urinary retention.  She states that she has been unable to urinate for the past 12-14 hours.  She reports severe suprapubic pain and low back pain.  She denies any fevers, chills, nausea, or vomiting.  There are no modifying factors.  She states that she has had this problem before and has been instructed to follow-up with urology.  She has never done so, instead electing to get drunk and self DC a prior foley catheter.   The history is provided by the patient. No language interpreter was used.    Past Medical History:  Diagnosis Date  . Alcoholism ([place])   . Depression   . Hepatitis     Patient Active Problem List   Diagnosis Date Noted  . Alcohol use disorder, moderate, dependence ([place]) 04/07/2016  . Major depressive disorder, recurrent episode with melancholic features ([place]) [id number]  . Alcohol dependence ([place]) 05/14/2012    Past Surgical History:  Procedure Laterality Date  . NO PAST SURGERIES      OB History    No data available       Home Medications    Prior to Admission medications   Medication Sig Start Date End Date Taking? Authorizing Provider  gabapentin (NEURONTIN) 300 MG capsule Take 1 capsule (300 mg total) by mouth 3 (three) times daily. For agitation 04/12/16   Encarnacion Slates, NP  hydrOXYzine (ATARAX/VISTARIL) 25 MG tablet Take 1 tablet (25 mg) four times daily as needed: For anxiety 04/12/16   Encarnacion Slates, NP  ibuprofen (ADVIL,MOTRIN) 200 MG tablet Take 1 tablet (200 mg total) by mouth every 6 (six) hours as needed for moderate pain. 04/12/16    Encarnacion Slates, NP  levothyroxine (SYNTHROID, LEVOTHROID) 75 MCG tablet Take 1 tablet (75 mcg total) by mouth daily before breakfast. For thyroid hormone replacement 04/12/16   Encarnacion Slates, NP  nicotine (NICODERM CQ - DOSED IN MG/24 HOURS) 21 mg/24hr patch Place 1 patch (21 mg total) onto the skin daily. For smoking cessation 04/12/16   Encarnacion Slates, NP  PARoxetine (PAXIL) 20 MG tablet Take 1 tablet (20 mg total) by mouth daily. For depression 04/12/16   Encarnacion Slates, NP  traZODone (DESYREL) 50 MG tablet Take 1 tablet (50 mg) at bedtime: For mood control 04/12/16   Encarnacion Slates, NP    Family History No family history on file.  Social History Social History  Substance Use Topics  . Smoking status: Never Smoker  . Smokeless tobacco: Never Used  . Alcohol use Yes     Allergies   Review of patient's allergies indicates no known allergies.   Review of Systems Review of Systems  Genitourinary: Positive for difficulty urinating.  Musculoskeletal: Positive for back pain.  All other systems reviewed and are negative.    Physical Exam Updated Vital Signs BP 100/66 (BP Location: Left Arm)   Pulse 98   Temp 97.5 F (36.4 C) (Oral)   Resp 16   Ht 5\' 4"  (1.626 m)   Wt 65.8 kg  LMP 03/31/2016   SpO2 97%   BMI 24.89 kg/m   Physical Exam  Constitutional: She is oriented to person, place, and time. She appears well-developed and well-nourished.  HENT:  Head: Normocephalic and atraumatic.  Eyes: Conjunctivae and EOM are normal. Pupils are equal, round, and reactive to light.  Neck: Normal range of motion. Neck supple.  Cardiovascular: Normal rate and regular rhythm.  Exam reveals no gallop and no friction rub.   No murmur heard. Pulmonary/Chest: Effort normal and breath sounds normal. No respiratory distress. She has no wheezes. She has no rales. She exhibits no tenderness.  Abdominal: Soft. Bowel sounds are normal. She exhibits no distension and no mass. There is no tenderness. There  is no rebound and no guarding.  Genitourinary: Vagina normal.  Genitourinary Comments: No lesions on external genitalia  Musculoskeletal: Normal range of motion. She exhibits no edema or tenderness.  Neurological: She is alert and oriented to person, place, and time.  Skin: Skin is warm and dry.  Psychiatric: She has a normal mood and affect. Her behavior is normal. Judgment and thought content normal.  Nursing note and vitals reviewed.    ED Treatments / Results  Labs (all labs ordered are listed, but only abnormal results are displayed) Labs Reviewed  URINALYSIS, ROUTINE W REFLEX MICROSCOPIC (NOT AT [facility])  PREGNANCY, URINE    EKG  EKG Interpretation None       Radiology No results found.  Procedures Procedures (including critical care time)  Medications Ordered in ED Medications - No data to display   Initial Impression / Assessment and Plan / ED Course  I have reviewed the triage vital signs and the nursing notes.  Pertinent labs & imaging results that were available during my care of the patient were reviewed by me and considered in my medical decision making (see chart for details).  Clinical Course    Patient with urinary retention since last night.  Bladder scan shows 756 ml of urine.  No sores or herpes on external genitalia. Patient has had some low back pain.  8:04 AM Patient feels relieved after foley catheter.  She is no longer having any pain. I am very concerned about the etiology of the urinary retention.  She has had associated low back pain and reports that she "is a colon cancer survivor," though this is not listed on history.  Other serious etiologies of urinary retention including cancer, MS, epidural abscess, chronic B12 deficiency with posterior cord degeneration are not ruled out.  However, patient adamantly refuses any additional testing today.  I have spent and extended amount of time with the patient encouraging her to have additional testing  done today, to which she refuses.    She is not incapacitated, she is not intoxicated, she has capacity to make her own medical decisions.  She wishes to be discharged now.  She will be signed out [place].  She is instructed to follow-up with urology and has been told not to remove the catheter herself as there is a balloon on the inside and she will injure herself.  I told her that she needs to have additional testing such as MRI of brain and L-spine to rule out MS or spine lesions.  She also refuses any blood work today, limiting my ability to test for acute kidney injury 2/2 to the retention.  She is instructed to return for worsening symptoms.    She expresses verbal understanding that she is placing herself at risk  for increased risk of severe morbidity and mortality, including worsening symptoms, paralysis, infection, organ failure, and death.  Final Clinical Impressions(s) / ED Diagnoses   Final diagnoses:  Urinary retention  Low back pain, unspecified back pain laterality, with sciatica presence unspecified    New Prescriptions New Prescriptions   No medications on file     Montine Circle, PA-C 04/13/16 0827    Duffy Bruce, MD 04/15/16 1139

## 2016-04-13 NOTE — ED Triage Notes (Signed)
Pt. reports urinary retention /bladder pressure onset last night , unable to give urine specimen at triage , denies hematuria , fever or chills.

## 2016-04-13 NOTE — ED Notes (Signed)
Pt. Refusing blood work. EDP made aware.

## 2016-04-14 ENCOUNTER — Emergency Department (HOSPITAL_COMMUNITY)
Admission: EM | Admit: 2016-04-14 | Discharge: 2016-04-14 | Disposition: A | Discharge status: Home / Self Care | Payer: Medicaid Other | Attending: Emergency Medicine | Admitting: Emergency Medicine

## 2016-04-14 ENCOUNTER — Encounter (HOSPITAL_COMMUNITY): Payer: Self-pay | Admitting: Oncology

## 2016-04-14 ENCOUNTER — Encounter (HOSPITAL_COMMUNITY): Payer: Self-pay

## 2016-04-14 ENCOUNTER — Emergency Department (HOSPITAL_COMMUNITY)
Admission: EM | Admit: 2016-04-14 | Discharge: 2016-04-14 | Disposition: A | Discharge status: Home / Self Care | Payer: Medicaid Other | Source: Home / Self Care | Attending: Emergency Medicine | Admitting: Emergency Medicine

## 2016-04-14 DIAGNOSIS — R339 Retention of urine, unspecified: Secondary | ICD-10-CM

## 2016-04-14 DIAGNOSIS — N760 Acute vaginitis: Secondary | ICD-10-CM | POA: Insufficient documentation

## 2016-04-14 DIAGNOSIS — Z4803 Encounter for change or removal of drains: Secondary | ICD-10-CM | POA: Diagnosis present

## 2016-04-14 DIAGNOSIS — Z466 Encounter for fitting and adjustment of urinary device: Secondary | ICD-10-CM

## 2016-04-14 MED ORDER — CLOTRIMAZOLE 1 % VA CREA: 1.0000 | TOPICAL_CREAM | Freq: Every day | VAGINAL | 0 refills | Status: DC

## 2016-04-14 NOTE — ED Notes (Signed)
Offered pt additional testing to ascertain etiology of urinary retention.  Pt vehemently refused.  Stated, "I know something is wrong, I just don't want to know what."    Education on proper catheter care given to pt, who verbalized understanding.

## 2016-04-14 NOTE — ED Provider Notes (Signed)
[facility] DEPT Provider Note   CSN: [id number] Arrival date & time: 04/14/16  2125  .     First MD Initiated Contact with Patient 04/14/16 1033     By signing my name below, I, Dawn Foley, attest that this documentation has been prepared under the direction and in the presence of [employer], PA-C. Electronically Signed: Sonum Foley, Education administrator. 04/14/16. 10:33 PM.   History   Chief Complaint Chief Complaint  Patient presents with  . Urinary Retention    The history is provided by the patient. No language interpreter was used.     HPI Comments: Dawn Foley is a 30 y.o. female with past medical history of hepatitis, alcoholism who presents to the Emergency Department complaining of urinary retention that began this morning. Patient was seen on 04/13/16 and had a foley catheter placed. She was seen at [facility] earlier this AM to have the catheter removed since she was voiding around the catheter. She was able to void after the catheter was removed but states she began retaining soon after discharge this morning. She has a urology appointment in a couple weeks. She reports associated lower abdominal pain which significantly improved after foley catheter was placed.  She reports history of alcohol dependence and states having 1 drink today. She denies fever, chills, unexpected weight loss.   Past Medical History:  Diagnosis Date  . Alcoholism ([place])   . Depression   . Hepatitis     Patient Active Problem List   Diagnosis Date Noted  . Alcohol use disorder, moderate, dependence ([place]) 04/07/2016  . Major depressive disorder, recurrent episode with melancholic features ([place]) [id number]  . Alcohol dependence ([place]) 05/14/2012    Past Surgical History:  Procedure Laterality Date  . NO PAST SURGERIES      OB History    Gravida Para Term Preterm AB Living             2   SAB TAB Ectopic Multiple Live Births                   Home Medications    Prior to Admission  medications   Medication Sig Start Date End Date Taking? Authorizing Provider  clotrimazole (GYNE-LOTRIMIN) 1 % vaginal cream Place 1 Applicatorful vaginally at bedtime. 04/14/16   Olivia Canter Sam, PA-C  gabapentin (NEURONTIN) 300 MG capsule Take 1 capsule (300 mg total) by mouth 3 (three) times daily. For agitation 04/12/16   Encarnacion Slates, NP  hydrOXYzine (ATARAX/VISTARIL) 25 MG tablet Take 1 tablet (25 mg) four times daily as needed: For anxiety 04/12/16   Encarnacion Slates, NP  ibuprofen (ADVIL,MOTRIN) 200 MG tablet Take 1 tablet (200 mg total) by mouth every 6 (six) hours as needed for moderate pain. 04/12/16   Encarnacion Slates, NP  levothyroxine (SYNTHROID, LEVOTHROID) 75 MCG tablet Take 1 tablet (75 mcg total) by mouth daily before breakfast. For thyroid hormone replacement 04/12/16   Encarnacion Slates, NP  nicotine (NICODERM CQ - DOSED IN MG/24 HOURS) 21 mg/24hr patch Place 1 patch (21 mg total) onto the skin daily. For smoking cessation 04/12/16   Encarnacion Slates, NP  PARoxetine (PAXIL) 20 MG tablet Take 1 tablet (20 mg total) by mouth daily. For depression 04/12/16   Encarnacion Slates, NP  traZODone (DESYREL) 50 MG tablet Take 1 tablet (50 mg) at bedtime: For mood control 04/12/16   Encarnacion Slates, NP    Family History No family history on file.  Social History Social History  Substance Use Topics  . Smoking status: Never Smoker  . Smokeless tobacco: Never Used  . Alcohol use Yes     Comment: heavy     Allergies   Review of patient's allergies indicates no known allergies.   Review of Systems Review of Systems  Constitutional: Negative for chills, fever and unexpected weight change.  All other systems reviewed and are negative.    Physical Exam Updated Vital Signs BP 111/78 (BP Location: Left Arm)   Pulse 99   Temp 98.3 F (36.8 C) (Oral)   Resp 20   Ht 5\' 4"  (1.626 m)   Wt 59.4 kg   LMP 03/31/2016   SpO2 94%   BMI 22.49 kg/m   Physical Exam  Constitutional: She is oriented to person, place,  and time. She appears well-developed and well-nourished.  HENT:  Head: Normocephalic and atraumatic.  Cardiovascular: Normal rate, regular rhythm and normal heart sounds.   Pulmonary/Chest: Effort normal and breath sounds normal.  Abdominal: Soft. There is no tenderness.  Neurological: She is alert and oriented to person, place, and time.  Skin: Skin is warm and dry.  Psychiatric: She has a normal mood and affect.  Nursing note and vitals reviewed.    ED Treatments / Results  DIAGNOSTIC STUDIES: Oxygen Saturation is 94% on RA, adequate by my interpretation.    COORDINATION OF CARE: 10:37 PM Discussed treatment plan with pt at bedside but patient wishes to be discharged. States she will follow up with urology for further testing.     Labs (all labs ordered are listed, but only abnormal results are displayed) Labs Reviewed - No data to display  EKG  EKG Interpretation None       Radiology No results found.  Procedures Procedures (including critical care time)  Medications Ordered in ED Medications - No data to display   Initial Impression / Assessment and Plan / ED Course  I have reviewed the triage vital signs and the nursing notes.  Pertinent labs & imaging results that were available during my care of the patient were reviewed by me and considered in my medical decision making (see chart for details).  Clinical Course     Final Clinical Impressions(s) / ED Diagnoses   Final diagnoses:  Urinary retention   Labs: refused  Imaging: refused   Consults:  Therapeutics:  Discharge Meds:   Assessment/Plan:   30 year old female presents today with urinary retention. Patient has been seen numerous times for the same the last 2 days. Patient had Foley removed this morning, reports she was able to urinate after but had urinary retention again. I am concerned in this patient and she has a history of colon cancer with acute urinary retention. Patient is adamantly  refusing any further evaluation including labs, diagnostic imaging here in the ED. I informed her that in order to further evaluate her urinary retention she would need to stay. Patient again refused further evaluation. I informed her this could be serious, life-threatening, or debilitating. Patient was an sounds state of mind, was not intoxicated, and understood the gravity of continued workup. The ED. She reports that she will follow-up with urology. Patient left prior to any further evaluation, she verbalized her understanding and agreement today's plan. Patient's significant other was at bedside also agreed to today's plan.  New Prescriptions New Prescriptions   No medications on file    I personally performed the services described in this documentation, which was scribed in my  presence. The recorded information has been reviewed and is accurate.   Okey Regal, PA-C 04/14/16 2259    Leo Grosser, MD 04/15/16 [phone]

## 2016-04-14 NOTE — ED Triage Notes (Addendum)
Per pt she has not been able to urinate since early this AM.  Pt had a catheter in place however had it removed at [facility] yesterday as she was urinating around it.  Has not followed up w/ urology yet.  Greater than 600 ml's in bladder per bladder scanner.

## 2016-04-14 NOTE — ED Triage Notes (Signed)
Pt. Here to get foley removed. Pt. Here yesterday for urinary retention. Pt. sts now she is urinating around foley catheter and is swollen in that area with some itching.

## 2016-04-14 NOTE — Discharge Instructions (Signed)
PLEASE FOLLOW UP WITH [facility] AS SOON AS POSSIBLE.

## 2016-04-14 NOTE — Discharge Instructions (Signed)
Please follow-up urology as soon as possible for further evaluation and management. Please return the emergency room immediately if any new or worsening signs or symptoms present.

## 2016-04-14 NOTE — ED Provider Notes (Signed)
[facility] DEPT Provider Note   CSN: [id number] Arrival date & time: 04/14/16  [id number]  First Provider Contact:  First MD Initiated Contact with Patient 04/14/16 0732      History   Chief Complaint Chief Complaint  Patient presents with  . Other    foley removal    HPI   Dawn Foley is an 30 y.o. female with history of alcohol abuse, hepatitis, depression who presents to the ED for foley catheter removal. She was seen in the ED yesterday for evaluation of urinary retention and foley was placed. She left AGAINST MEDICAL ADVICE from yesterday's visit prior to any workup including bloodwork and MRI for further evaluation of her sudden onset urinary retention. Pt does report she has had something similar happen before but she has never followed up with urology. She is here today simply for foley removal. She states urine is leaking around the sides and her groin is itchy. She thinks it is a yeast infection. She does not want any further evaluation today of her urinary retention. She states she will follow up with urology. She denies abdominal pain, nausea, vomiting, fever, chills, chest pain, or SOB. She denies recent drug or alcohol use.  Past Medical History:  Diagnosis Date  . Alcoholism ([place])   . Depression   . Hepatitis     Patient Active Problem List   Diagnosis Date Noted  . Alcohol use disorder, moderate, dependence ([place]) 04/07/2016  . Major depressive disorder, recurrent episode with melancholic features ([place]) [id number]  . Alcohol dependence ([place]) 05/14/2012    Past Surgical History:  Procedure Laterality Date  . NO PAST SURGERIES      OB History    Gravida Para Term Preterm AB Living             2   SAB TAB Ectopic Multiple Live Births                   Home Medications    Prior to Admission medications   Medication Sig Start Date End Date Taking? Authorizing Provider  gabapentin (NEURONTIN) 300 MG capsule Take 1 capsule (300 mg total) by mouth 3  (three) times daily. For agitation 04/12/16   Encarnacion Slates, NP  hydrOXYzine (ATARAX/VISTARIL) 25 MG tablet Take 1 tablet (25 mg) four times daily as needed: For anxiety 04/12/16   Encarnacion Slates, NP  ibuprofen (ADVIL,MOTRIN) 200 MG tablet Take 1 tablet (200 mg total) by mouth every 6 (six) hours as needed for moderate pain. 04/12/16   Encarnacion Slates, NP  levothyroxine (SYNTHROID, LEVOTHROID) 75 MCG tablet Take 1 tablet (75 mcg total) by mouth daily before breakfast. For thyroid hormone replacement 04/12/16   Encarnacion Slates, NP  nicotine (NICODERM CQ - DOSED IN MG/24 HOURS) 21 mg/24hr patch Place 1 patch (21 mg total) onto the skin daily. For smoking cessation 04/12/16   Encarnacion Slates, NP  PARoxetine (PAXIL) 20 MG tablet Take 1 tablet (20 mg total) by mouth daily. For depression 04/12/16   Encarnacion Slates, NP  traZODone (DESYREL) 50 MG tablet Take 1 tablet (50 mg) at bedtime: For mood control 04/12/16   Encarnacion Slates, NP    Family History History reviewed. No pertinent family history.  Social History Social History  Substance Use Topics  . Smoking status: Never Smoker  . Smokeless tobacco: Never Used  . Alcohol use Yes     Comment: heavy     Allergies   Review of  patient's allergies indicates no known allergies.   Review of Systems Review of Systems  All other systems reviewed and are negative.    Physical Exam Updated Vital Signs BP 115/87 (BP Location: Right Arm)   Pulse 89   Temp 97.9 F (36.6 C) (Oral)   Resp 14   Ht 5\' 4"  (1.626 m)   Wt 59.4 kg   LMP 03/31/2016   SpO2 98%   BMI 22.49 kg/m   Physical Exam  Constitutional: She is oriented to person, place, and time. No distress.  HENT:  Head: Atraumatic.  Right Ear: External ear normal.  Left Ear: External ear normal.  Nose: Nose normal.  Eyes: Conjunctivae are normal. No scleral icterus.  Cardiovascular: Normal rate and regular rhythm.   Pulmonary/Chest: Effort normal. No respiratory distress.  Abdominal: Soft. She exhibits  no distension. There is no tenderness. There is no guarding.  Genitourinary:  Genitourinary Comments: Foley catheter in place. Some curd like discharge surrounding catheter. No other lesions or rashes.   Neurological: She is alert and oriented to person, place, and time.  Skin: Skin is warm and dry. She is not diaphoretic.  Psychiatric: She has a normal mood and affect. Her behavior is normal.  Nursing note and vitals reviewed.    ED Treatments / Results  Labs (all labs ordered are listed, but only abnormal results are displayed) Labs Reviewed - No data to display  EKG  EKG Interpretation None       Radiology No results found.  Procedures Procedures (including critical care time)  Medications Ordered in ED Medications - No data to display   Initial Impression / Assessment and Plan / ED Course  I have reviewed the triage vital signs and the nursing notes.  Pertinent labs & imaging results that were available during my care of the patient were reviewed by me and considered in my medical decision making (see chart for details).  Clinical Course    Pt presenting for foley removal after leaving AMA yesterday for further evaluation of acute urinary retention. She has several medical comorbidies including a self reported history of colon cancer. I had a long discussion with her about leaving foley in place and staying in the ED for further evaluation. She is refusing. She simply wants the cath removed and to leave. She understands that her acute urinary retention could be indicative of pathology that results in disability or even death. She is of sound mind and judgement and wishing to leave. We assisted pt in removing foley and provided prescription for yeast vulvovaginitis as requested. I strongly encouraged her to follow up very closely with urology with strict ER return precautions given.  Final Clinical Impressions(s) / ED Diagnoses   Final diagnoses:  Encounter for Foley  catheter removal  Vulvovaginitis    New Prescriptions Discharge Medication List as of 04/14/2016  8:06 AM    START taking these medications   Details  clotrimazole (GYNE-LOTRIMIN) 1 % vaginal cream Place 1 Applicatorful vaginally at bedtime., Starting Sat 04/14/2016, Print         Anne Ng, PA-C 04/15/16 [id number]    Charlesetta Shanks, MD 04/15/16 [phone]

## 2016-05-01 NOTE — Progress Notes (Signed)
CSW spoke with patient's care coordinator Suszanne Conners. [phone] to update on patient's discharge plans.  Tilden Fossa, LCSW Clinical Social Worker [facility] [phone]

## 2016-06-01 ENCOUNTER — Encounter (HOSPITAL_COMMUNITY): Payer: Self-pay | Admitting: *Deleted

## 2016-06-01 ENCOUNTER — Inpatient Hospital Stay (HOSPITAL_COMMUNITY)
Admission: AD | Admit: 2016-06-01 | Discharge: 2016-06-07 | DRG: 897 | Disposition: A | Discharge status: Home / Self Care | Payer: Medicaid Other | Source: Intra-hospital | Attending: Psychiatry | Admitting: Psychiatry

## 2016-06-01 DIAGNOSIS — F10231 Alcohol dependence with withdrawal delirium: Secondary | ICD-10-CM | POA: Diagnosis present

## 2016-06-01 DIAGNOSIS — F410 Panic disorder [episodic paroxysmal anxiety] without agoraphobia: Secondary | ICD-10-CM | POA: Diagnosis present

## 2016-06-01 DIAGNOSIS — Z85038 Personal history of other malignant neoplasm of large intestine: Secondary | ICD-10-CM | POA: Diagnosis not present

## 2016-06-01 DIAGNOSIS — E039 Hypothyroidism, unspecified: Secondary | ICD-10-CM | POA: Diagnosis present

## 2016-06-01 DIAGNOSIS — Z79899 Other long term (current) drug therapy: Secondary | ICD-10-CM

## 2016-06-01 DIAGNOSIS — Z9114 Patient's other noncompliance with medication regimen: Secondary | ICD-10-CM | POA: Diagnosis not present

## 2016-06-01 DIAGNOSIS — F339 Major depressive disorder, recurrent, unspecified: Secondary | ICD-10-CM | POA: Diagnosis present

## 2016-06-01 DIAGNOSIS — G4700 Insomnia, unspecified: Secondary | ICD-10-CM | POA: Diagnosis present

## 2016-06-01 DIAGNOSIS — F1994 Other psychoactive substance use, unspecified with psychoactive substance-induced mood disorder: Secondary | ICD-10-CM | POA: Diagnosis present

## 2016-06-01 DIAGNOSIS — R45851 Suicidal ideations: Secondary | ICD-10-CM | POA: Diagnosis not present

## 2016-06-01 DIAGNOSIS — F1020 Alcohol dependence, uncomplicated: Secondary | ICD-10-CM | POA: Diagnosis present

## 2016-06-01 DIAGNOSIS — F10931 Alcohol use, unspecified with withdrawal delirium: Secondary | ICD-10-CM | POA: Clinically undetermined

## 2016-06-01 MED ORDER — HYDROXYZINE HCL 25 MG PO TABS
25.0000 mg | ORAL_TABLET | Freq: Four times a day (QID) | ORAL | Status: AC | PRN
  Administered 2016-06-02 – 2016-06-04 (×4): 25 mg via ORAL
  Filled 2016-06-01 (×4): qty 1

## 2016-06-01 MED ORDER — VITAMIN B-1 100 MG PO TABS: 100.0000 mg | ORAL_TABLET | Freq: Every day | ORAL | Status: DC

## 2016-06-01 MED ORDER — ALUM & MAG HYDROXIDE-SIMETH 200-200-20 MG/5ML PO SUSP: 30.0000 mL | ORAL | Status: DC | PRN

## 2016-06-01 MED ORDER — ONDANSETRON 4 MG PO TBDP
4.0000 mg | ORAL_TABLET | Freq: Four times a day (QID) | ORAL | Status: AC | PRN
  Administered 2016-06-01 – 2016-06-04 (×6): 4 mg via ORAL
  Filled 2016-06-01 (×6): qty 1

## 2016-06-01 MED ORDER — THIAMINE HCL 100 MG/ML IJ SOLN: 100.0000 mg | Freq: Once | INTRAMUSCULAR | Status: DC

## 2016-06-01 MED ORDER — CHLORDIAZEPOXIDE HCL 25 MG PO CAPS
25.0000 mg | ORAL_CAPSULE | ORAL | Status: DC
  Administered 2016-06-04: 25 mg via ORAL
  Filled 2016-06-01: qty 1

## 2016-06-01 MED ORDER — LOPERAMIDE HCL 2 MG PO CAPS
2.0000 mg | ORAL_CAPSULE | ORAL | Status: AC | PRN
  Administered 2016-06-03 – 2016-06-04 (×3): 2 mg via ORAL
  Filled 2016-06-01 (×3): qty 1

## 2016-06-01 MED ORDER — CHLORDIAZEPOXIDE HCL 25 MG PO CAPS
25.0000 mg | ORAL_CAPSULE | Freq: Four times a day (QID) | ORAL | Status: DC | PRN
  Administered 2016-06-02 – 2016-06-04 (×5): 25 mg via ORAL
  Filled 2016-06-01 (×5): qty 1

## 2016-06-01 MED ORDER — THIAMINE HCL 100 MG/ML IJ SOLN
100.0000 mg | Freq: Once | INTRAMUSCULAR | Status: DC
  Filled 2016-06-01: qty 2

## 2016-06-01 MED ORDER — CHLORDIAZEPOXIDE HCL 25 MG PO CAPS: 25.0000 mg | ORAL_CAPSULE | Freq: Every day | ORAL | Status: DC

## 2016-06-01 MED ORDER — CHLORDIAZEPOXIDE HCL 25 MG PO CAPS
25.0000 mg | ORAL_CAPSULE | Freq: Four times a day (QID) | ORAL | Status: AC
  Administered 2016-06-01 – 2016-06-02 (×4): 25 mg via ORAL
  Filled 2016-06-01 (×4): qty 1

## 2016-06-01 MED ORDER — MAGNESIUM HYDROXIDE 400 MG/5ML PO SUSP: 30.0000 mL | Freq: Every day | ORAL | Status: DC | PRN

## 2016-06-01 MED ORDER — CHLORDIAZEPOXIDE HCL 25 MG PO CAPS
25.0000 mg | ORAL_CAPSULE | Freq: Three times a day (TID) | ORAL | Status: AC
  Administered 2016-06-03 (×3): 25 mg via ORAL
  Filled 2016-06-01 (×3): qty 1

## 2016-06-01 MED ORDER — VITAMIN B-1 100 MG PO TABS
100.0000 mg | ORAL_TABLET | Freq: Every day | ORAL | Status: DC
  Administered 2016-06-02 – 2016-06-04 (×3): 100 mg via ORAL
  Filled 2016-06-01 (×5): qty 1

## 2016-06-01 MED ORDER — ADULT MULTIVITAMIN W/MINERALS CH
1.0000 | ORAL_TABLET | Freq: Every day | ORAL | Status: DC
  Administered 2016-06-02 – 2016-06-07 (×6): 1 via ORAL
  Filled 2016-06-01 (×9): qty 1

## 2016-06-01 MED ORDER — TRAZODONE HCL 50 MG PO TABS
50.0000 mg | ORAL_TABLET | Freq: Every evening | ORAL | Status: DC | PRN
  Administered 2016-06-01 – 2016-06-06 (×5): 50 mg via ORAL
  Filled 2016-06-01 (×5): qty 1

## 2016-06-01 MED ORDER — ACETAMINOPHEN 325 MG PO TABS
650.0000 mg | ORAL_TABLET | Freq: Four times a day (QID) | ORAL | Status: DC | PRN
  Administered 2016-06-02 – 2016-06-06 (×7): 650 mg via ORAL
  Filled 2016-06-01 (×7): qty 2
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
## 2016-06-01 NOTE — Tx Team (Signed)
Initial Treatment Plan 06/01/2016 11:12 PM Dawn Foley [id number]    PATIENT STRESSORS: Medication change or noncompliance Substance abuse   PATIENT STRENGTHS: Communication skills Motivation for treatment/growth Physical Health Supportive family/friends   PATIENT IDENTIFIED PROBLEMS: At risk for suicide  Substance Abuse  "Getting help going to rehab"  "Getting medications straight"               DISCHARGE CRITERIA:  Ability to meet basic life and health needs Improved stabilization in mood, thinking, and/or behavior Medical problems require only outpatient monitoring Motivation to continue treatment in a less acute level of care Need for constant or close observation no longer present Reduction of life-threatening or endangering symptoms to within safe limits Verbal commitment to aftercare and medication compliance Withdrawal symptoms are absent or subacute and managed without 24-hour nursing intervention  PRELIMINARY DISCHARGE PLAN: Attend 12-step recovery group Outpatient therapy Return to previous living arrangement  PATIENT/FAMILY INVOLVEMENT: This treatment plan has been presented to and reviewed with the patient, Dawn Foley.  The patient and family have been given the opportunity to ask questions and make suggestions.  Dustin Flock, RN 06/01/2016, 11:12 PM

## 2016-06-02 ENCOUNTER — Encounter (HOSPITAL_COMMUNITY): Payer: Self-pay | Admitting: Psychiatry

## 2016-06-02 DIAGNOSIS — E039 Hypothyroidism, unspecified: Secondary | ICD-10-CM | POA: Diagnosis present

## 2016-06-02 DIAGNOSIS — Z79899 Other long term (current) drug therapy: Secondary | ICD-10-CM

## 2016-06-02 DIAGNOSIS — F1020 Alcohol dependence, uncomplicated: Secondary | ICD-10-CM

## 2016-06-02 DIAGNOSIS — R45851 Suicidal ideations: Secondary | ICD-10-CM

## 2016-06-02 LAB — COMPREHENSIVE METABOLIC PANEL
ALBUMIN: 4.5 g/dL (ref 3.5–5.0)
ALK PHOS: 60 U/L (ref 38–126)
ALT: 28 U/L (ref 14–54)
ANION GAP: 9 (ref 5–15)
AST: 43 U/L — AB (ref 15–41)
BILIRUBIN TOTAL: 0.8 mg/dL (ref 0.3–1.2)
BUN: 8 mg/dL (ref 6–20)
CALCIUM: 10.1 mg/dL (ref 8.9–10.3)
CO2: 28 mmol/L (ref 22–32)
Chloride: 97 mmol/L — ABNORMAL LOW (ref 101–111)
Creatinine, Ser: 0.72 mg/dL (ref 0.44–1.00)
GFR calc Af Amer: >60 mL/min (ref >60)
GFR calc non Af Amer: >60 mL/min (ref >60)
GLUCOSE: 106 mg/dL — AB (ref 65–99)
POTASSIUM: 4.4 mmol/L (ref 3.5–5.1)
SODIUM: 134 mmol/L — AB (ref 135–145)
TOTAL PROTEIN: 8.5 g/dL — AB (ref 6.5–8.1)

## 2016-06-02 LAB — CBC
HEMATOCRIT: 43.4 % (ref 36.0–46.0)
HEMOGLOBIN: 14.6 g/dL (ref 12.0–15.0)
MCH: 29.2 pg (ref 26.0–34.0)
MCHC: 33.6 g/dL (ref 30.0–36.0)
MCV: 86.8 fL (ref 78.0–100.0)
Platelets: 50 10*3/uL — ABNORMAL LOW (ref 150–400)
RBC: 5 MIL/uL (ref 3.87–5.11)
RDW: 14.6 % (ref 11.5–15.5)
WBC: 3.8 10*3/uL — ABNORMAL LOW (ref 4.0–10.5)

## 2016-06-02 LAB — TSH: TSH: 7.91 u[IU]/mL — ABNORMAL HIGH (ref 0.350–4.500)

## 2016-06-02 MED ORDER — HALOPERIDOL 5 MG PO TABS
2.5000 mg | ORAL_TABLET | Freq: Four times a day (QID) | ORAL | Status: DC | PRN
  Administered 2016-06-02 – 2016-06-03 (×2): 2.5 mg via ORAL
  Filled 2016-06-02 (×2): qty 1

## 2016-06-02 MED ORDER — LEVOTHYROXINE SODIUM 75 MCG PO TABS
75.0000 ug | ORAL_TABLET | Freq: Every day | ORAL | Status: DC
  Administered 2016-06-02 – 2016-06-07 (×6): 75 ug via ORAL
  Filled 2016-06-02 (×10): qty 1

## 2016-06-02 MED ORDER — GABAPENTIN 300 MG PO CAPS
300.0000 mg | ORAL_CAPSULE | Freq: Four times a day (QID) | ORAL | Status: DC
  Administered 2016-06-02 – 2016-06-07 (×20): 300 mg via ORAL
  Filled 2016-06-02 (×32): qty 1

## 2016-06-02 MED ORDER — BENZTROPINE MESYLATE 0.5 MG PO TABS
0.5000 mg | ORAL_TABLET | Freq: Three times a day (TID) | ORAL | Status: DC | PRN
  Administered 2016-06-02 – 2016-06-04 (×5): 0.5 mg via ORAL
  Filled 2016-06-02 (×5): qty 1

## 2016-06-02 NOTE — BHH Group Notes (Signed)
Patient did not attend group.
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
## 2016-06-02 NOTE — Progress Notes (Signed)
Patient did not attend AA group meeting. 

## 2016-06-02 NOTE — BHH Group Notes (Signed)
[facility] LCSW Group Therapy  06/02/2016   Type of Therapy:  Group Therapy  Participation Level:  Did Not Attend  Christene Lye 06/02/2016,
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
## 2016-06-02 NOTE — Progress Notes (Signed)
D: Pt passive SI- contracts for safety denies HI/AVH. Pt is very emotional. "It takes me 7 days to detox off alcohol, I usually take Ativan and Librium". Pt appears to be only focused on her medications, pt appears to be having withdrawal Sx, but when observed interacting on the unit pt appears to be less symptomatic.   A: Pt was offered support and encouragement. Pt was given scheduled medications. Pt was encourage to attend groups. Q 15 minute checks were done for safety.   R:Pt attends groups and interacts well with peers and staff. Pt is taking medication. Pt has no complaints.Pt receptive to treatment and safety maintained on unit.

## 2016-06-02 NOTE — Progress Notes (Signed)
D: Patient lying in bed, face covered with towel. When awakened moans and c/o severe withdrawals, shaking, nausea, diarrhea. Angry about being confined to her room until this afternoon. Unwilling to answer questions about mood and suicidality or AVH.  A: medications as ordered, encouraged increased fluid intake with gatorade and pitcher of ice water given. R: Pt sleeping in between medications.

## 2016-06-02 NOTE — Progress Notes (Signed)
Patient ID: Dawn Foley, female   DOB: 09/29/1985, 30 y.o.   MRN: [id number] Received report from [place].  Writer introduced self to client, encouraged her to report concerns. Medications reviewed, administered as ordered. Staff will monitor for safety. Q23min. Client is safe on the unit. Client is on contact precautions.

## 2016-06-03 DIAGNOSIS — F339 Major depressive disorder, recurrent, unspecified: Secondary | ICD-10-CM

## 2016-06-03 LAB — LIPID PANEL
Cholesterol: 223 mg/dL — ABNORMAL HIGH (ref 0–200)
HDL: 134 mg/dL (ref >40)
LDL CALC: 77 mg/dL (ref 0–99)
Total CHOL/HDL Ratio: 1.7 RATIO
Triglycerides: 62 mg/dL (ref <150)
VLDL: 12 mg/dL (ref 0–40)

## 2016-06-03 MED ORDER — LIP MEDEX EX OINT
TOPICAL_OINTMENT | CUTANEOUS | Status: DC | PRN
  Filled 2016-06-03 (×2): qty 7

## 2016-06-03 MED ORDER — HALOPERIDOL 5 MG PO TABS
ORAL_TABLET | ORAL | Status: AC
  Administered 2016-06-03: 16:00:00
  Filled 2016-06-03: qty 1

## 2016-06-03 MED ORDER — ENSURE ENLIVE PO LIQD
237.0000 mL | Freq: Three times a day (TID) | ORAL | Status: DC
  Administered 2016-06-03 – 2016-06-07 (×10): 237 mL via ORAL

## 2016-06-03 MED ORDER — HALOPERIDOL 1 MG PO TABS: 0.5000 mg | ORAL_TABLET | Freq: Four times a day (QID) | ORAL | Status: DC | PRN

## 2016-06-03 MED ORDER — HALOPERIDOL 5 MG PO TABS
5.0000 mg | ORAL_TABLET | Freq: Four times a day (QID) | ORAL | Status: DC | PRN
Start: 2016-06-03 — End: 2016-06-07
  Administered 2016-06-03 – 2016-06-06 (×5): 5 mg via ORAL
  Filled 2016-06-03 (×5): qty 1

## 2016-06-03 NOTE — Progress Notes (Signed)
D: Patient's self inventory sheet: patient has poor sleep, recieved sleep medication.poor  Appetite, low energy level, poor concentration. Rated depression 7/10, hopeless 9/10, anxiety 10+/10. SI/HI/AVH: Denies. Physical complaints are withdrawal symptoms, tremors, diarrhea, cravings, agitation, runny nose, chilling, cramping, nausea, irritability, also lightheadedness, dizziness, headache and pain in head and whole body. Goal is "get meds right". Plans to work on "talk to the doctor". Pt is present in the milieu, improved from yesterday.  A: Medications administered, assessed medication knowledge and education given on medication regimen.  Emotional support and encouragement given patient. R: Denies SI and HI , contracts for safety. Safety maintained with 15 minute checks.

## 2016-06-03 NOTE — BHH Group Notes (Signed)
[facility] Group Notes:  (Nursing/MHT/Case Management/Adjunct)  Date:  06/03/2016  Time:  1330  Type of Therapy:  Nurse Education  Participation Level:  Did Not Attend  Participation Quality:    Affect:    Cognitive:    Insight:    Engagement in Group:    Modes of Intervention:    Summary of Progress/Problems: Patient was invited to group however elected not to attend.  Loletta Specter [facility] 06/03/2016, 1415

## 2016-06-03 NOTE — BHH Suicide Risk Assessment (Signed)
[facility] Adult Inpatient Family/Significant Other Suicide Prevention Education  Suicide Prevention Education:   Patient Refusal for Family/Significant Other Suicide Prevention Education: The patient has refused to provide written consent for family/significant other to be provided Family/Significant Other Suicide Prevention Education during admission and/or prior to discharge.  Physician notified.  CSW provided suicide prevention information with patient.    The suicide prevention education provided includes the following:  Suicide risk factors  Suicide prevention and interventions  National Suicide Hotline telephone number  [facility] assessment telephone number  [facility] Emergency Assistance [place] and/or Residential Mobile Crisis Unit telephone number   Theressa Millard, [place] 06/03/2016 1:09 PM

## 2016-06-03 NOTE — BHH Group Notes (Signed)
[place] LCSW Group Therapy  06/03/2016   10:00 AM   Type of Therapy:  Group Therapy  Participation Level:  Active  Participation Quality:  Appropriate and Attentive  Affect:  Flat, depressed  Cognitive:  Alert and Appropriate  Insight:  Developing/Improving and Engaged  Engagement in Therapy:  Developing/Improving and Engaged  Modes of Intervention:  Clarification, Confrontation, Discussion, Education, Exploration, Limit-setting, Orientation, Problem-solving, Rapport Building, Art therapist, Socialization and Support  Summary of Progress/Problems: The main focus of today's process group was to identify the patient's current support system and decide on other supports that can be put in place.  An emphasis was placed on using counselor, doctor, therapy groups, 12-step groups, and problem-specific support groups to expand supports, as well as doing something different than has been done before. Pt shared that her grandmother is a great support but is down right now due to them losing her grandfather.  Pt shared that she notices her family is supportive of her when she is doing bad, such as buying her a 6 pack of beer, but when she is doing well, like in a facility or prison, no one visits her or checks on her.  Discussed pt having this conversation with her supports to encourage them to support her when she is doing well too.  Pt actively participated in group discussion.    Theressa Millard, [place] 06/03/2016 3:53 PM

## 2016-06-03 NOTE — Progress Notes (Signed)
EKG done, but patient unable to be still due to tremors and feeling she is going to have diarrhea. EKG may not be accurate.
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
## 2016-06-03 NOTE — Progress Notes (Signed)
Patient did attend the evening speaker AA meeting.  

## 2016-06-03 NOTE — BHH Group Notes (Signed)
Pt did not attend group. 

## 2016-06-03 NOTE — Progress Notes (Signed)
[facility] MD Progress Note  06/03/2016 9:59 AM Dawn Foley  MRN:  [id number]   Subjective:  Patient reports " I need a lot of help, I need a medication adjustment." - Patient reports history of [place] CA  Objective: Dawn Foley is awake, alert and oriented X4 , found attending group session.  Denies suicidal or homicidal ideation. Reports intermittent  auditory and visual hallucination. patients does not appear to be responding to internal stimuli. Patient interacts well with staff and others. Patient reports she is medication compliant without mediation side effects. Report learning new coping skills. States her depression 8/10. Patient states "Iam  feeling not myself today still a tot of shakiness"  Reports poor appetite other wise and resting well. States diarrhea and nausea. Support, encouragement and reassurance was provided.   Principal Problem: Major depressive disorder, recurrent episode with melancholic features ([place]) Diagnosis:   Patient Active Problem List   Diagnosis Date Noted  . Hypothyroidism [E03.9] 06/02/2016  . Alcohol use disorder, moderate, dependence ([place]) [F10.20] 04/07/2016  . Major depressive disorder, recurrent episode with melancholic features ([place]) [C94.7] 04/07/2016  . Alcohol dependence ([place]) [F10.20] 05/14/2012   Total Time spent with patient: 45 minutes  Past Psychiatric History: See Above  Past Medical History:  Past Medical History:  Diagnosis Date  . Alcoholism ([place])   . Depression   . Hepatitis     Past Surgical History:  Procedure Laterality Date  . NO PAST SURGERIES     Family History:  Family History  Problem Relation Age of Onset  . Mental illness Other    Family Psychiatric  History: see above Social History:  History  Alcohol Use  . Yes    Comment: heavy     History  Drug Use No    Social History   Social History  . Marital status: Single    Spouse name: N/A  . Number of children: N/A  . Years of education: N/A    Social History Main Topics  . Smoking status: Never Smoker  . Smokeless tobacco: Never Used  . Alcohol use Yes     Comment: heavy  . Drug use: No  . Sexual activity: Yes    Birth control/ protection: Condom   Other Topics Concern  . None   Social History Narrative  . None   Additional Social History:    Pain Medications: pt denies Prescriptions: pt denies Over the Counter: pt denies History of alcohol / drug use?: Yes Longest period of sobriety (when/how long): 8 months -- when she was in prison Name of Substance 1: Alcohol 1 - Age of First Use: 13 1 - Amount (size/oz): Varied 1 - Frequency: Daily 1 - Duration: Ongoing 1 - Last Use / Amount: 05/31/2016                  Sleep: Poor  Appetite:  Fair  Current Medications: Current Facility-Administered Medications  Medication Dose Route Frequency Provider Last Rate Last Dose  . acetaminophen (TYLENOL) tablet 650 mg  650 mg Oral Q6H PRN Rozetta Nunnery, NP   650 mg at 06/03/16 0803  . alum & mag hydroxide-simeth (MAALOX/MYLANTA) 200-200-20 MG/5ML suspension 30 mL  30 mL Oral Q4H PRN Rozetta Nunnery, NP      . benztropine (COGENTIN) tablet 0.5 mg  0.5 mg Oral TID PRN Ursula Alert, MD   0.5 mg at 06/03/16 0952  . chlordiazePOXIDE (LIBRIUM) capsule 25 mg  25 mg Oral Q6H PRN Rozetta Nunnery,  NP   25 mg at 06/03/16 0617  . chlordiazePOXIDE (LIBRIUM) capsule 25 mg  25 mg Oral TID Rozetta Nunnery, NP   25 mg at 06/03/16 0803   Followed by  . [START ON 06/04/2016] chlordiazePOXIDE (LIBRIUM) capsule 25 mg  25 mg Oral BH-qamhs Rozetta Nunnery, NP       Followed by  . [START ON 06/05/2016] chlordiazePOXIDE (LIBRIUM) capsule 25 mg  25 mg Oral Daily Rozetta Nunnery, NP      . gabapentin (NEURONTIN) capsule 300 mg  300 mg Oral QID Ursula Alert, MD   300 mg at 06/03/16 0803  . haloperidol (HALDOL) tablet 2.5 mg  2.5 mg Oral Q6H PRN Ursula Alert, MD   2.5 mg at 06/03/16 0953  . hydrOXYzine (ATARAX/VISTARIL) tablet 25 mg  25 mg Oral Q6H  PRN Rozetta Nunnery, NP   25 mg at 06/03/16 0617  . levothyroxine (SYNTHROID, LEVOTHROID) tablet 75 mcg  75 mcg Oral QAC breakfast Ursula Alert, MD   75 mcg at 06/03/16 0617  . loperamide (IMODIUM) capsule 2-4 mg  2-4 mg Oral PRN Rozetta Nunnery, NP   2 mg at 06/03/16 0803  . magnesium hydroxide (MILK OF MAGNESIA) suspension 30 mL  30 mL Oral Daily PRN Rozetta Nunnery, NP      . multivitamin with minerals tablet 1 tablet  1 tablet Oral Daily Rozetta Nunnery, NP   1 tablet at 06/03/16 0803  . ondansetron (ZOFRAN-ODT) disintegrating tablet 4 mg  4 mg Oral Q6H PRN Rozetta Nunnery, NP   4 mg at 06/03/16 0803  . thiamine (B-1) injection 100 mg  100 mg Intramuscular Once Rozetta Nunnery, NP      . thiamine (VITAMIN B-1) tablet 100 mg  100 mg Oral Daily Rozetta Nunnery, NP   100 mg at 06/03/16 0803  . traZODone (DESYREL) tablet 50 mg  50 mg Oral QHS PRN Rozetta Nunnery, NP   50 mg at 06/02/16 2116    Lab Results:  Results for orders placed or performed during the hospital encounter of 06/01/16 (from the past 48 hour(s))  TSH     Status: Abnormal   Collection Time: 06/02/16  6:33 PM  Result Value Ref Range   TSH 7.910 (H) 0.350 - 4.500 uIU/mL    Comment: Performed at [facility]  Comprehensive metabolic panel     Status: Abnormal   Collection Time: 06/02/16  6:33 PM  Result Value Ref Range   Sodium 134 (L) 135 - 145 mmol/L   Potassium 4.4 3.5 - 5.1 mmol/L   Chloride 97 (L) 101 - 111 mmol/L   CO2 28 22 - 32 mmol/L   Glucose, Bld 106 (H) 65 - 99 mg/dL   BUN 8 6 - 20 mg/dL   Creatinine, Ser 0.72 0.44 - 1.00 mg/dL   Calcium 10.1 8.9 - 10.3 mg/dL   Total Protein 8.5 (H) 6.5 - 8.1 g/dL   Albumin 4.5 3.5 - 5.0 g/dL   AST 43 (H) 15 - 41 U/L   ALT 28 14 - 54 U/L   Alkaline Phosphatase 60 38 - 126 U/L   Total Bilirubin 0.8 0.3 - 1.2 mg/dL   GFR calc non Af Amer >60 >60 mL/min   GFR calc Af Amer >60 >60 mL/min    Comment: (NOTE) The eGFR has been calculated using the CKD EPI equation. This  calculation has not been validated in all clinical situations. eGFR's persistently <60 mL/min  signify possible Chronic Kidney Disease.    Anion gap 9 5 - 15    Comment: Performed at [facility]  CBC     Status: Abnormal   Collection Time: 06/02/16  6:33 PM  Result Value Ref Range   WBC 3.8 (L) 4.0 - 10.5 K/uL   RBC 5.00 3.87 - 5.11 MIL/uL   Hemoglobin 14.6 12.0 - 15.0 g/dL   HCT 43.4 36.0 - 46.0 %   MCV 86.8 78.0 - 100.0 fL   MCH 29.2 26.0 - 34.0 pg   MCHC 33.6 30.0 - 36.0 g/dL   RDW 14.6 11.5 - 15.5 %   Platelets 50 (L) 150 - 400 K/uL    Comment: CONSISTENT WITH PREVIOUS RESULT Performed at [facility]   Lipid panel     Status: Abnormal   Collection Time: 06/02/16  6:33 PM  Result Value Ref Range   Cholesterol 223 (H) 0 - 200 mg/dL   Triglycerides 62 <150 mg/dL   HDL 134 >40 mg/dL   Total CHOL/HDL Ratio 1.7 RATIO   VLDL 12 0 - 40 mg/dL   LDL Cholesterol 77 0 - 99 mg/dL    Comment:        Total Cholesterol/HDL:CHD Risk Coronary Heart Disease Risk Table                     Men   Women  1/2 Average Risk   3.4   3.3  Average Risk       5.0   4.4  2 X Average Risk   9.6   7.1  3 X Average Risk  23.4   11.0        Use the calculated Patient Ratio above and the CHD Risk Table to determine the patient's CHD Risk.        ATP III CLASSIFICATION (LDL):  <100     mg/dL   Optimal  100-129  mg/dL   Near or Above                    Optimal  130-159  mg/dL   Borderline  160-189  mg/dL   High  >190     mg/dL   Very High Performed at [facility]     Blood Alcohol level:  Lab Results  Component Value Date   ETH 53 (H) 03/13/2013   ETH 413 (HH) 22/97/9892    Metabolic Disorder Labs: No results found for: HGBA1C, MPG No results found for: PROLACTIN Lab Results  Component Value Date   CHOL 223 (H) 06/02/2016   TRIG 62 06/02/2016   HDL 134 06/02/2016   CHOLHDL 1.7 06/02/2016   VLDL 12 06/02/2016   LDLCALC 77 06/02/2016     Physical Findings: AIMS: Facial and Oral Movements Muscles of Facial Expression: None, normal Lips and Perioral Area: None, normal Jaw: None, normal Tongue: None, normal,Extremity Movements Upper (arms, wrists, hands, fingers): None, normal Lower (legs, knees, ankles, toes): None, normal, Trunk Movements Neck, shoulders, hips: None, normal, Overall Severity Severity of abnormal movements (highest score from questions above): None, normal Incapacitation due to abnormal movements: None, normal Patient's awareness of abnormal movements (rate only patient's report): No Awareness, Dental Status Current problems with teeth and/or dentures?: No Does patient usually wear dentures?: No  CIWA:  CIWA-Ar Total: 7 COWS:     Musculoskeletal: Strength & Muscle Tone: decreased Gait & Station: unsteady Patient leans: N/A  Psychiatric Specialty Exam: Physical Exam  Nursing note and vitals reviewed. Constitutional: She is oriented to person, place, and time. She appears well-developed.  Neurological: She is alert and oriented to person, place, and time.  Skin: Skin is warm and dry.  Psychiatric: She has a normal mood and affect. Her behavior is normal.    Review of Systems  Gastrointestinal: Positive for diarrhea, nausea and vomiting. Negative for blood in stool.  Musculoskeletal: Positive for joint pain.  Psychiatric/Behavioral: Positive for depression, hallucinations, substance abuse and suicidal ideas. The patient is nervous/anxious and has insomnia.     Blood pressure 113/90, pulse 95, temperature 98.1 F (36.7 C), temperature source Oral, resp. rate 20, height 5' 4"  (1.626 m), weight 62.6 kg (138 lb).Body mass index is 23.69 kg/m.  General Appearance: Casual and Disheveled  Eye Contact:  Good  Speech:  Slow  Volume:  Decreased  Mood:  Anxious and Depressed  Affect:  Constricted  Thought Process:  Coherent  Orientation:  Full (Time, Place, and Person)  Thought Content:   Hallucinations: Auditory Visual patient reports only during detoxing   Suicidal Thoughts:  No  Homicidal Thoughts:  No  Memory:  Immediate;   Poor Recent;   Poor Remote;   Fair  Judgement:  Intact  Insight:  Present  Psychomotor Activity:  Restlessness  Concentration:  Concentration: Poor  Recall:  Poor  Fund of Knowledge:  Poor  Language:  Fair  Akathisia:  No  Handed:  Right  AIMS (if indicated):     Assets:  Communication Skills Resilience Social Support  ADL's:  Intact  Cognition:  WNL  Sleep:  Number of Hours: 5     I agree with current treatment plan on 06/03/2016, Patient seen face-to-face for psychiatric evaluation follow-up, chart reviewed. Reviewed the information documented and agree with the treatment plan.  Treatment Plan Summary: Daily contact with patient to assess and evaluate symptoms and progress in treatment and Medication management Start Ensure 237 ml PO TID Called and orders placed for Internal consult after repeat/follow- labs CBC, CMP, A1c TSH, Lipid and EKG (pending) Continue Neurontin 33m PO TID, increased  Haldo 2.541mto 0.5 mg for mood stabilization Continue with Paxil 20 mg for mood stabilization Continue with Trazodone 50 mg for insomnia Started on CWIA/Librium Protocol  TaDerrill CenterNP 06/03/2016, 9:59 AM

## 2016-06-03 NOTE — Progress Notes (Signed)
Received consult for question about patient's platelets. CBC obtained yesterday showed a platelet count of 50,000. 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 Previous platelet count was 92 on 04/11/2016. Advised that patient should have her platelet count checked in 1-2 days to ensure that it is not dropping rapidly. Otherwise she can follow-up outpatient if she is not having any evidence of bleeding.  Cordelia Poche, MD [facility] 06/03/2016, 3:43 PM Pager: [phone]

## 2016-06-04 LAB — URINALYSIS, ROUTINE W REFLEX MICROSCOPIC
Bilirubin Urine: NEGATIVE
GLUCOSE, UA: NEGATIVE mg/dL
HGB URINE DIPSTICK: NEGATIVE
KETONES UR: NEGATIVE mg/dL
Leukocytes, UA: NEGATIVE
Nitrite: NEGATIVE
PROTEIN: NEGATIVE mg/dL
Specific Gravity, Urine: 1.003 — ABNORMAL LOW (ref 1.005–1.030)
pH: 6.5 (ref 5.0–8.0)

## 2016-06-04 LAB — HEMOGLOBIN A1C
Hgb A1c MFr Bld: 4.8 % (ref 4.8–5.6)
Mean Plasma Glucose: 91 mg/dL

## 2016-06-04 MED ORDER — QUETIAPINE FUMARATE 300 MG PO TABS
300.0000 mg | ORAL_TABLET | Freq: Three times a day (TID) | ORAL | Status: DC
  Administered 2016-06-04 – 2016-06-05 (×3): 300 mg via ORAL
  Filled 2016-06-04 (×6): qty 1

## 2016-06-04 MED ORDER — LORAZEPAM 1 MG PO TABS
1.0000 mg | ORAL_TABLET | Freq: Three times a day (TID) | ORAL | Status: DC
  Administered 2016-06-04 – 2016-06-05 (×2): 1 mg via ORAL
  Filled 2016-06-04 (×3): qty 1

## 2016-06-04 MED ORDER — NICOTINE 21 MG/24HR TD PT24
21.0000 mg | MEDICATED_PATCH | Freq: Every day | TRANSDERMAL | Status: DC
  Administered 2016-06-04 – 2016-06-07 (×4): 21 mg via TRANSDERMAL
  Filled 2016-06-04 (×7): qty 1

## 2016-06-04 MED ORDER — LORAZEPAM 1 MG PO TABS
2.0000 mg | ORAL_TABLET | ORAL | Status: AC
  Administered 2016-06-04: 2 mg via ORAL
  Filled 2016-06-04: qty 2

## 2016-06-04 MED ORDER — LORAZEPAM 1 MG PO TABS
1.0000 mg | ORAL_TABLET | Freq: Four times a day (QID) | ORAL | Status: AC | PRN
  Administered 2016-06-04 – 2016-06-06 (×8): 1 mg via ORAL
  Filled 2016-06-04 (×7): qty 1

## 2016-06-04 MED ORDER — CHLORDIAZEPOXIDE HCL 25 MG PO CAPS: 25.0000 mg | ORAL_CAPSULE | Freq: Two times a day (BID) | ORAL | Status: DC

## 2016-06-04 MED ORDER — CHLORDIAZEPOXIDE HCL 25 MG PO CAPS: 25.0000 mg | ORAL_CAPSULE | Freq: Four times a day (QID) | ORAL | Status: DC

## 2016-06-04 MED ORDER — VITAMIN B-1 100 MG PO TABS
100.0000 mg | ORAL_TABLET | Freq: Two times a day (BID) | ORAL | Status: DC
  Administered 2016-06-04 – 2016-06-07 (×5): 100 mg via ORAL
  Filled 2016-06-04 (×12): qty 1

## 2016-06-04 NOTE — Progress Notes (Signed)
Patient ID: Dawn Foley, female   DOB: 07-08-1986, 29 y.o.   MRN: [id number] Nursing noted pt had some possible nystagmus-pt is on thiamine po (refused injection) Pt is somewhat oversedated earlier. A/p will increase thiamine to 200 mg po bid for possible Wernicke signs.

## 2016-06-04 NOTE — Progress Notes (Signed)
[facility] MD Progress Note  06/04/2016 1:07 PM Dawn Foley  MRN:  [id number] Subjective:  Admit over weekend. C/o withdrawal symptoms, not being managed with current librium feels anxious sweaty shakey. Relates was taking Seroquel regularly 300 mg po tid PTA, was not taking Paxil, and has had variable compliance with thyroid med due to drinking.  Endorses tolerance withdrawal unable to cut down use deespite consequences for ETOH Principal Problem: Major depressive disorder, recurrent episode with melancholic features ([place]) Diagnosis:   Patient Active Problem List   Diagnosis Date Noted  . Hypothyroidism [E03.9] 06/02/2016  . Alcohol use disorder, moderate, dependence ([place]) [F10.20] 04/07/2016  . Major depressive disorder, recurrent episode with melancholic features ([place]) [R74.0] 04/07/2016  . Alcohol dependence ([place]) [F10.20] 05/14/2012   Total Time spent with patient: 30 minutes  Past Psychiatric History: has been prescribed seroquel, paxil gabapentin in past  Past Medical History:  Past Medical History:  Diagnosis Date  . Alcoholism ([place])   . Depression   . Hepatitis     Past Surgical History:  Procedure Laterality Date  . NO PAST SURGERIES     Family History:  Family History  Problem Relation Age of Onset  . Mental illness Other    Family Psychiatric  History: no change Social History:  History  Alcohol Use  . Yes    Comment: heavy     History  Drug Use No    Social History   Social History  . Marital status: Single    Spouse name: N/A  . Number of children: N/A  . Years of education: N/A   Social History Main Topics  . Smoking status: Never Smoker  . Smokeless tobacco: Never Used  . Alcohol use Yes     Comment: heavy  . Drug use: No  . Sexual activity: Yes    Birth control/ protection: Condom   Other Topics Concern  . None   Social History Narrative  . None   Additional Social History:    Pain Medications: pt denies Prescriptions: pt  denies Over the Counter: pt denies History of alcohol / drug use?: Yes Longest period of sobriety (when/how long): 8 months -- when she was in prison Name of Substance 1: Alcohol 1 - Age of First Use: 13 1 - Amount (size/oz): Varied 1 - Frequency: Daily 1 - Duration: Ongoing 1 - Last Use / Amount: 05/31/2016                  Sleep: Fair  Appetite:  Fair  Current Medications: Current Facility-Administered Medications  Medication Dose Route Frequency Provider Last Rate Last Dose  . acetaminophen (TYLENOL) tablet 650 mg  650 mg Oral Q6H PRN Rozetta Nunnery, NP   650 mg at 06/03/16 2000  . alum & mag hydroxide-simeth (MAALOX/MYLANTA) 200-200-20 MG/5ML suspension 30 mL  30 mL Oral Q4H PRN Rozetta Nunnery, NP      . benztropine (COGENTIN) tablet 0.5 mg  0.5 mg Oral TID PRN Ursula Alert, MD   0.5 mg at 06/04/16 0827  . feeding supplement (ENSURE ENLIVE) (ENSURE ENLIVE) liquid 237 mL  237 mL Oral TID BM Derrill Center, NP   237 mL at 06/04/16 0829  . gabapentin (NEURONTIN) capsule 300 mg  300 mg Oral QID Ursula Alert, MD   300 mg at 06/04/16 1203  . haloperidol (HALDOL) tablet 5 mg  5 mg Oral Q6H PRN Derrill Center, NP   5 mg at 06/04/16 0827  . hydrOXYzine (ATARAX/VISTARIL)  tablet 25 mg  25 mg Oral Q6H PRN Rozetta Nunnery, NP   25 mg at 06/04/16 0004  . levothyroxine (SYNTHROID, LEVOTHROID) tablet 75 mcg  75 mcg Oral QAC breakfast Ursula Alert, MD   75 mcg at 06/04/16 0607  . lip balm (CARMEX) ointment   Topical PRN Derrill Center, NP      . loperamide (IMODIUM) capsule 2-4 mg  2-4 mg Oral PRN Rozetta Nunnery, NP   2 mg at 06/04/16 0827  . LORazepam (ATIVAN) tablet 1 mg  1 mg Oral Q8H Linard Millers, MD      . LORazepam (ATIVAN) tablet 1 mg  1 mg Oral Q6H PRN Linard Millers, MD      . magnesium hydroxide (MILK OF MAGNESIA) suspension 30 mL  30 mL Oral Daily PRN Rozetta Nunnery, NP      . multivitamin with minerals tablet 1 tablet  1 tablet Oral Daily Rozetta Nunnery, NP   1  tablet at 06/04/16 [id number]  . nicotine (NICODERM CQ - dosed in mg/24 hours) patch 21 mg  21 mg Transdermal Daily Linard Millers, MD   21 mg at 06/04/16 1203  . ondansetron (ZOFRAN-ODT) disintegrating tablet 4 mg  4 mg Oral Q6H PRN Rozetta Nunnery, NP   4 mg at 06/04/16 0827  . QUEtiapine (SEROQUEL) tablet 300 mg  300 mg Oral TID Linard Millers, MD   300 mg at 06/04/16 1204  . thiamine (B-1) injection 100 mg  100 mg Intramuscular Once Rozetta Nunnery, NP      . thiamine (VITAMIN B-1) tablet 100 mg  100 mg Oral Daily Rozetta Nunnery, NP   100 mg at 06/04/16 0820  . traZODone (DESYREL) tablet 50 mg  50 mg Oral QHS PRN Rozetta Nunnery, NP   50 mg at 06/03/16 2111    Lab Results:  Results for orders placed or performed during the hospital encounter of 06/01/16 (from the past 48 hour(s))  TSH     Status: Abnormal   Collection Time: 06/02/16  6:33 PM  Result Value Ref Range   TSH 7.910 (H) 0.350 - 4.500 uIU/mL    Comment: Performed at [facility]  Comprehensive metabolic panel     Status: Abnormal   Collection Time: 06/02/16  6:33 PM  Result Value Ref Range   Sodium 134 (L) 135 - 145 mmol/L   Potassium 4.4 3.5 - 5.1 mmol/L   Chloride 97 (L) 101 - 111 mmol/L   CO2 28 22 - 32 mmol/L   Glucose, Bld 106 (H) 65 - 99 mg/dL   BUN 8 6 - 20 mg/dL   Creatinine, Ser 0.72 0.44 - 1.00 mg/dL   Calcium 10.1 8.9 - 10.3 mg/dL   Total Protein 8.5 (H) 6.5 - 8.1 g/dL   Albumin 4.5 3.5 - 5.0 g/dL   AST 43 (H) 15 - 41 U/L   ALT 28 14 - 54 U/L   Alkaline Phosphatase 60 38 - 126 U/L   Total Bilirubin 0.8 0.3 - 1.2 mg/dL   GFR calc non Af Amer >60 >60 mL/min   GFR calc Af Amer >60 >60 mL/min    Comment: (NOTE) The eGFR has been calculated using the CKD EPI equation. This calculation has not been validated in all clinical situations. eGFR's persistently <60 mL/min signify possible Chronic Kidney Disease.    Anion gap 9 5 - 15    Comment: Performed at [facility]  CBC     Status: Abnormal   Collection Time: 06/02/16  6:33 PM  Result Value Ref Range   WBC 3.8 (L) 4.0 - 10.5 K/uL   RBC 5.00 3.87 - 5.11 MIL/uL   Hemoglobin 14.6 12.0 - 15.0 g/dL   HCT 43.4 36.0 - 46.0 %   MCV 86.8 78.0 - 100.0 fL   MCH 29.2 26.0 - 34.0 pg   MCHC 33.6 30.0 - 36.0 g/dL   RDW 14.6 11.5 - 15.5 %   Platelets 50 (L) 150 - 400 K/uL    Comment: CONSISTENT WITH PREVIOUS RESULT Performed at [facility]   Hemoglobin A1c     Status: None   Collection Time: 06/02/16  6:33 PM  Result Value Ref Range   Hgb A1c MFr Bld 4.8 4.8 - 5.6 %    Comment: (NOTE)         Pre-diabetes: 5.7 - 6.4         Diabetes: >6.4         Glycemic control for adults with diabetes: <7.0    Mean Plasma Glucose 91 mg/dL    Comment: (NOTE) Performed At: [facility], [place] [id number] Lindon Romp MD OM:[id number] Performed at [facility]   Lipid panel     Status: Abnormal   Collection Time: 06/02/16  6:33 PM  Result Value Ref Range   Cholesterol 223 (H) 0 - 200 mg/dL   Triglycerides 62 <150 mg/dL   HDL 134 >40 mg/dL   Total CHOL/HDL Ratio 1.7 RATIO   VLDL 12 0 - 40 mg/dL   LDL Cholesterol 77 0 - 99 mg/dL    Comment:        Total Cholesterol/HDL:CHD Risk Coronary Heart Disease Risk Table                     Men   Women  1/2 Average Risk   3.4   3.3  Average Risk       5.0   4.4  2 X Average Risk   9.6   7.1  3 X Average Risk  23.4   11.0        Use the calculated Patient Ratio above and the CHD Risk Table to determine the patient's CHD Risk.        ATP III CLASSIFICATION (LDL):  <100     mg/dL   Optimal  100-129  mg/dL   Near or Above                    Optimal  130-159  mg/dL   Borderline  160-189  mg/dL   High  >190     mg/dL   Very High Performed at [facility]     Blood Alcohol level:  Lab Results  Component Value Date   ETH 53 (H) 03/13/2013   ETH 413 (HH) 45/36/4680    Metabolic  Disorder Labs: Lab Results  Component Value Date   HGBA1C 4.8 06/02/2016   MPG 91 06/02/2016   No results found for: PROLACTIN Lab Results  Component Value Date   CHOL 223 (H) 06/02/2016   TRIG 62 06/02/2016   HDL 134 06/02/2016   CHOLHDL 1.7 06/02/2016   VLDL 12 06/02/2016   LDLCALC 77 06/02/2016    Physical Findings: AIMS: Facial and Oral Movements Muscles of Facial Expression: None, normal Lips and Perioral Area: None, normal Jaw: None, normal Tongue: None, normal,Extremity Movements  Upper (arms, wrists, hands, fingers): None, normal Lower (legs, knees, ankles, toes): None, normal, Trunk Movements Neck, shoulders, hips: None, normal, Overall Severity Severity of abnormal movements (highest score from questions above): None, normal Incapacitation due to abnormal movements: None, normal Patient's awareness of abnormal movements (rate only patient's report): No Awareness, Dental Status Current problems with teeth and/or dentures?: No Does patient usually wear dentures?: No  CIWA:  CIWA-Ar Total: 9 COWS:     Musculoskeletal: Strength & Muscle Tone: within normal limits Gait & Station: normal Patient leans: N/A  Psychiatric Specialty Exam: Physical Exam  Review of Systems  Constitutional: Positive for diaphoresis and malaise/fatigue.    Blood pressure 92/76, pulse (!) 104, temperature 97.6 F (36.4 C), temperature source Oral, resp. rate 16, height _0  (1.626 m), weight 62.6 kg (138 lb).Body mass index is 23.69 kg/m.  General Appearance: WDWN W who appears anxious and restless and with flucuating attention/concentration. As difficulty focusing on understanding when she gets medications  Eye Contact:  Fair  Speech:  Normal Rate  Volume:  Normal  Mood:  Anxious  Affect:  Labile  Thought Process:  Disorganized  Orientation:  Full (Time, Place, and Person)  Thought Content:  Negative  Suicidal Thoughts:  No  Homicidal Thoughts:  No  Memory:  Immediate;    Poor Recent;   Fair Remote;   Good  Judgement:  Impaired  Insight:  Shallow  Psychomotor Activity:  Restlessness  Concentration:  Concentration: Fair and Attention Span: Fair  Recall:  [employer] of Knowledge:  Fair  Language:  Good  Akathisia:  Negative  Handed:  Right  AIMS (if indicated):   0  Assets:  Physical Health  ADL's:  Intact  Cognition:  WNL  Sleep:  Number of Hours: 4.75     Treatment Plan Summary: Daily contact with patient to assess and evaluate symptoms and progress in treatment, Medication management and continue to evaluate for withdrawal. Pt did appear to be experiencing significant withdrawal and agitation and medication regimen was adjusted with chanage to ativan and increased dosing. Seroquel will be restarted as well.  Linard Millers, MD 06/04/2016, 1:07 PM

## 2016-06-04 NOTE — [facility] Group Notes (Signed)
[facility] LCSW Group Therapy 06/04/2016  1:15 PM   Type of Therapy: Group Therapy  Participation Level: Did Not Attend. Patient invited to participate but declined.   Tilden Fossa, MSW, [place] Clinical Social Worker [facility] [phone]

## 2016-06-04 NOTE — Progress Notes (Signed)
[facility] MD Progress Note  06/04/2016 2:50 PM Dawn Foley  MRN:  [id number] Subjective:  Pt upset could not have prn ativan at 1400 due to oversedation. I reiterated to her that since she was very unsteady, slurring her speech she would not be given a prn. Pt attempted to overturn furniture in day room. Pt has been labile during day with some verbal aggression as well. Discussed with nursing and we feel she might benefit from a quieter environment and will visit her on ward 500 once a room is vacant.  Principal Problem: Major depressive disorder, recurrent episode with melancholic features ([place]) Diagnosis:   Patient Active Problem List   Diagnosis Date Noted  . Hypothyroidism [E03.9] 06/02/2016  . Alcohol use disorder, moderate, dependence ([place]) [F10.20] 04/07/2016  . Major depressive disorder, recurrent episode with melancholic features ([place]) [K87.6] 04/07/2016  . Alcohol dependence ([place]) [F10.20] 05/14/2012   Total Time spent with patient: 15 minutes  Past Psychiatric History: no change  Past Medical History:  Past Medical History:  Diagnosis Date  . Alcoholism ([place])   . Depression   . Hepatitis     Past Surgical History:  Procedure Laterality Date  . NO PAST SURGERIES     Family History:  Family History  Problem Relation Age of Onset  . Mental illness Other    Family Psychiatric  History: no change Social History:  History  Alcohol Use  . Yes    Comment: heavy     History  Drug Use No    Social History   Social History  . Marital status: Single    Spouse name: N/A  . Number of children: N/A  . Years of education: N/A   Social History Main Topics  . Smoking status: Never Smoker  . Smokeless tobacco: Never Used  . Alcohol use Yes     Comment: heavy  . Drug use: No  . Sexual activity: Yes    Birth control/ protection: Condom   Other Topics Concern  . None   Social History Narrative  . None   Additional Social History:    Pain Medications: pt  denies Prescriptions: pt denies Over the Counter: pt denies History of alcohol / drug use?: Yes Longest period of sobriety (when/how long): 8 months -- when she was in prison Name of Substance 1: Alcohol 1 - Age of First Use: 13 1 - Amount (size/oz): Varied 1 - Frequency: Daily 1 - Duration: Ongoing 1 - Last Use / Amount: 05/31/2016                  Sleep: Fair  Appetite:  Fair  Current Medications: Current Facility-Administered Medications  Medication Dose Route Frequency Provider Last Rate Last Dose  . acetaminophen (TYLENOL) tablet 650 mg  650 mg Oral Q6H PRN Rozetta Nunnery, NP   650 mg at 06/03/16 2000  . alum & mag hydroxide-simeth (MAALOX/MYLANTA) 200-200-20 MG/5ML suspension 30 mL  30 mL Oral Q4H PRN Rozetta Nunnery, NP      . benztropine (COGENTIN) tablet 0.5 mg  0.5 mg Oral TID PRN Ursula Alert, MD   0.5 mg at 06/04/16 0827  . feeding supplement (ENSURE ENLIVE) (ENSURE ENLIVE) liquid 237 mL  237 mL Oral TID BM Derrill Center, NP   237 mL at 06/04/16 1444  . gabapentin (NEURONTIN) capsule 300 mg  300 mg Oral QID Ursula Alert, MD   300 mg at 06/04/16 1203  . haloperidol (HALDOL) tablet 5 mg  5 mg  Oral Q6H PRN Derrill Center, NP   5 mg at 06/04/16 0827  . hydrOXYzine (ATARAX/VISTARIL) tablet 25 mg  25 mg Oral Q6H PRN Rozetta Nunnery, NP   25 mg at 06/04/16 0004  . levothyroxine (SYNTHROID, LEVOTHROID) tablet 75 mcg  75 mcg Oral QAC breakfast Ursula Alert, MD   75 mcg at 06/04/16 0607  . lip balm (CARMEX) ointment   Topical PRN Derrill Center, NP      . loperamide (IMODIUM) capsule 2-4 mg  2-4 mg Oral PRN Rozetta Nunnery, NP   2 mg at 06/04/16 0827  . LORazepam (ATIVAN) tablet 1 mg  1 mg Oral Q8H Linard Millers, MD      . LORazepam (ATIVAN) tablet 1 mg  1 mg Oral Q6H PRN Linard Millers, MD      . magnesium hydroxide (MILK OF MAGNESIA) suspension 30 mL  30 mL Oral Daily PRN Rozetta Nunnery, NP      . multivitamin with minerals tablet 1 tablet  1 tablet Oral Daily  Rozetta Nunnery, NP   1 tablet at 06/04/16 [phone]  . nicotine (NICODERM CQ - dosed in mg/24 hours) patch 21 mg  21 mg Transdermal Daily Linard Millers, MD   21 mg at 06/04/16 1203  . ondansetron (ZOFRAN-ODT) disintegrating tablet 4 mg  4 mg Oral Q6H PRN Rozetta Nunnery, NP   4 mg at 06/04/16 0827  . QUEtiapine (SEROQUEL) tablet 300 mg  300 mg Oral TID Linard Millers, MD   300 mg at 06/04/16 1204  . thiamine (B-1) injection 100 mg  100 mg Intramuscular Once Rozetta Nunnery, NP      . thiamine (VITAMIN B-1) tablet 100 mg  100 mg Oral Daily Rozetta Nunnery, NP   100 mg at 06/04/16 0820  . traZODone (DESYREL) tablet 50 mg  50 mg Oral QHS PRN Rozetta Nunnery, NP   50 mg at 06/03/16 2111    Lab Results:  Results for orders placed or performed during the hospital encounter of 06/01/16 (from the past 48 hour(s))  TSH     Status: Abnormal   Collection Time: 06/02/16  6:33 PM  Result Value Ref Range   TSH 7.910 (H) 0.350 - 4.500 uIU/mL    Comment: Performed at [facility]  Comprehensive metabolic panel     Status: Abnormal   Collection Time: 06/02/16  6:33 PM  Result Value Ref Range   Sodium 134 (L) 135 - 145 mmol/L   Potassium 4.4 3.5 - 5.1 mmol/L   Chloride 97 (L) 101 - 111 mmol/L   CO2 28 22 - 32 mmol/L   Glucose, Bld 106 (H) 65 - 99 mg/dL   BUN 8 6 - 20 mg/dL   Creatinine, Ser 0.72 0.44 - 1.00 mg/dL   Calcium 10.1 8.9 - 10.3 mg/dL   Total Protein 8.5 (H) 6.5 - 8.1 g/dL   Albumin 4.5 3.5 - 5.0 g/dL   AST 43 (H) 15 - 41 U/L   ALT 28 14 - 54 U/L   Alkaline Phosphatase 60 38 - 126 U/L   Total Bilirubin 0.8 0.3 - 1.2 mg/dL   GFR calc non Af Amer >60 >60 mL/min   GFR calc Af Amer >60 >60 mL/min    Comment: (NOTE) The eGFR has been calculated using the CKD EPI equation. This calculation has not been validated in all clinical situations. eGFR's persistently <60 mL/min signify possible Chronic Kidney Disease.  Anion gap 9 5 - 15    Comment: Performed at [facility]  CBC     Status: Abnormal   Collection Time: 06/02/16  6:33 PM  Result Value Ref Range   WBC 3.8 (L) 4.0 - 10.5 K/uL   RBC 5.00 3.87 - 5.11 MIL/uL   Hemoglobin 14.6 12.0 - 15.0 g/dL   HCT 43.4 36.0 - 46.0 %   MCV 86.8 78.0 - 100.0 fL   MCH 29.2 26.0 - 34.0 pg   MCHC 33.6 30.0 - 36.0 g/dL   RDW 14.6 11.5 - 15.5 %   Platelets 50 (L) 150 - 400 K/uL    Comment: CONSISTENT WITH PREVIOUS RESULT Performed at [facility]   Hemoglobin A1c     Status: None   Collection Time: 06/02/16  6:33 PM  Result Value Ref Range   Hgb A1c MFr Bld 4.8 4.8 - 5.6 %    Comment: (NOTE)         Pre-diabetes: 5.7 - 6.4         Diabetes: >6.4         Glycemic control for adults with diabetes: <7.0    Mean Plasma Glucose 91 mg/dL    Comment: (NOTE) Performed At: [facility] [place], [place] [id number] Lindon Romp MD ID:[id number] Performed at [facility]   Lipid panel     Status: Abnormal   Collection Time: 06/02/16  6:33 PM  Result Value Ref Range   Cholesterol 223 (H) 0 - 200 mg/dL   Triglycerides 62 <150 mg/dL   HDL 134 >40 mg/dL   Total CHOL/HDL Ratio 1.7 RATIO   VLDL 12 0 - 40 mg/dL   LDL Cholesterol 77 0 - 99 mg/dL    Comment:        Total Cholesterol/HDL:CHD Risk Coronary Heart Disease Risk Table                     Men   Women  1/2 Average Risk   3.4   3.3  Average Risk       5.0   4.4  2 X Average Risk   9.6   7.1  3 X Average Risk  23.4   11.0        Use the calculated Patient Ratio above and the CHD Risk Table to determine the patient's CHD Risk.        ATP III CLASSIFICATION (LDL):  <100     mg/dL   Optimal  100-129  mg/dL   Near or Above                    Optimal  130-159  mg/dL   Borderline  160-189  mg/dL   High  >190     mg/dL   Very High Performed at [facility]     Blood Alcohol level:  Lab Results  Component Value Date   ETH 53 (H) 03/13/2013   ETH 413 (HH) 44/31/5400     Metabolic Disorder Labs: Lab Results  Component Value Date   HGBA1C 4.8 06/02/2016   MPG 91 06/02/2016   No results found for: PROLACTIN Lab Results  Component Value Date   CHOL 223 (H) 06/02/2016   TRIG 62 06/02/2016   HDL 134 06/02/2016   CHOLHDL 1.7 06/02/2016   VLDL 12 06/02/2016   LDLCALC 77 06/02/2016    Physical Findings: AIMS: Facial and Oral Movements Muscles  of Facial Expression: None, normal Lips and Perioral Area: None, normal Jaw: None, normal Tongue: None, normal,Extremity Movements Upper (arms, wrists, hands, fingers): None, normal Lower (legs, knees, ankles, toes): None, normal, Trunk Movements Neck, shoulders, hips: None, normal, Overall Severity Severity of abnormal movements (highest score from questions above): None, normal Incapacitation due to abnormal movements: None, normal Patient's awareness of abnormal movements (rate only patient's report): No Awareness, Dental Status Current problems with teeth and/or dentures?: No Does patient usually wear dentures?: No  CIWA:  CIWA-Ar Total: 9 COWS:     Musculoskeletal: Strength & Muscle Tone: within normal limits Gait & Station: unsteady Patient leans: unsteady, standing up swaying slightly  Psychiatric Specialty Exam: Physical Exam  ROS  Blood pressure 92/76, pulse (!) 104, temperature 97.6 F (36.4 C), temperature source Oral, resp. rate 16, height 5' 4"  (1.626 m), weight 62.6 kg (138 lb).Body mass index is 23.69 kg/m.  General Appearance: Casual  Eye Contact:  Fair  Speech:  Slurred  Volume:  Decreased  Mood:  Angry  Affect:  Congruent  Thought Process:  Disorganized  Orientation:  Negative  Thought Content:  Negative  Suicidal Thoughts:  No  Homicidal Thoughts:  No  Memory:  Negative  Judgement:  Poor  Insight:  Lacking  Psychomotor Activity:  Decreased  Concentration:  Concentration: Poor and Attention Span: Poor  Recall:  [place] of Knowledge:  Fair  Language:  Negative   Akathisia:  Negative  Handed:  Right  AIMS (if indicated):     Assets:  Resilience  ADL's:  Intact  Cognition:  WNL  Sleep:  Number of Hours: 4.75     Treatment Plan Summary: visit on ward 500 secondary to overactivation by 300 environment and behavioral concerns  Linard Millers, MD 06/04/2016, 2:50 PM

## 2016-06-04 NOTE — Progress Notes (Signed)
Psychoeducational Group Note  Date:  06/04/2016 Time:  2328  Group Topic/Focus:  Wrap-Up Group:   The focus of this group is to help patients review their daily goal of treatment and discuss progress on daily workbooks.   Participation Level: Did Not Attend  Participation Quality:  Not Applicable  Affect:  Not Applicable  Cognitive:  Not Applicable  Insight:  Not Applicable  Engagement in Group: Not Applicable  Additional Comments: The patient did not attend group since she was asleep in her bedroom and while she was awake, appeared to be dizzy.  Archie Balboa S 06/04/2016, 11:28 PM

## 2016-06-04 NOTE — Progress Notes (Signed)
Pt reports she is having significant withdrawal symptoms this evening.  She has visible tremors with complaints of generalized pain.  Pt has not c/o diarrhea anymore this evening.  She did request ginger ale for her stomach which was given.  PRN meds given as ordered.  Pt thought she could have Librium any time she wanted, so writer had to explain to her how prn medications were ordered.  Pt seems a little slow in her thought processing.  Pt wants to go for long term treatment after her detox.  Support and encouragement offered.  Discharge plans are in process.  Safety maintained with q15 minute checks.

## 2016-06-04 NOTE — Tx Team (Signed)
Interdisciplinary Treatment and Diagnostic Plan Update  06/04/2016 Time of Session: 9:30am Ora M Nazario MRN: [id number]  Principal Diagnosis: Major depressive disorder, recurrent episode with melancholic features (HCC)  Secondary Diagnoses: Principal Problem:   Major depressive disorder, recurrent episode with melancholic features (HCC) Active Problems:   Alcohol use disorder, moderate, dependence (HCC)   Hypothyroidism   Current Medications:  Current Facility-Administered Medications  Medication Dose Route Frequency Provider Last Rate Last Dose  . acetaminophen (TYLENOL) tablet 650 mg  650 mg Oral Q6H PRN Jason A Berry, NP   650 mg at 06/03/16 2000  . alum & mag hydroxide-simeth (MAALOX/MYLANTA) 200-200-20 MG/5ML suspension 30 mL  30 mL Oral Q4H PRN Jason A Berry, NP      . benztropine (COGENTIN) tablet 0.5 mg  0.5 mg Oral TID PRN Saramma Eappen, MD   0.5 mg at 06/04/16 0827  . chlordiazePOXIDE (LIBRIUM) capsule 25 mg  25 mg Oral BH-qamhs Jason A Berry, NP   25 mg at 06/04/16 0820   Followed by  . [START ON 06/05/2016] chlordiazePOXIDE (LIBRIUM) capsule 25 mg  25 mg Oral Daily Jason A Berry, NP      . chlordiazePOXIDE (LIBRIUM) capsule 25 mg  25 mg Oral BID Elizabeth Woods Oates, MD      . chlordiazePOXIDE (LIBRIUM) capsule 25 mg  25 mg Oral Q6H Elizabeth Woods Oates, MD      . feeding supplement (ENSURE ENLIVE) (ENSURE ENLIVE) liquid 237 mL  237 mL Oral TID BM Tanika N Lewis, NP   237 mL at 06/04/16 0829  . gabapentin (NEURONTIN) capsule 300 mg  300 mg Oral QID Saramma Eappen, MD   300 mg at 06/04/16 0820  . haloperidol (HALDOL) tablet 5 mg  5 mg Oral Q6H PRN Tanika N Lewis, NP   5 mg at 06/04/16 0827  . hydrOXYzine (ATARAX/VISTARIL) tablet 25 mg  25 mg Oral Q6H PRN Jason A Berry, NP   25 mg at 06/04/16 0004  . levothyroxine (SYNTHROID, LEVOTHROID) tablet 75 mcg  75 mcg Oral QAC breakfast Saramma Eappen, MD   75 mcg at 06/04/16 0607  . lip balm (CARMEX) ointment   Topical PRN  Tanika N Lewis, NP      . loperamide (IMODIUM) capsule 2-4 mg  2-4 mg Oral PRN Jason A Berry, NP   2 mg at 06/04/16 0827  . magnesium hydroxide (MILK OF MAGNESIA) suspension 30 mL  30 mL Oral Daily PRN Jason A Berry, NP      . multivitamin with minerals tablet 1 tablet  1 tablet Oral Daily Jason A Berry, NP   1 tablet at 06/04/16 0821  . ondansetron (ZOFRAN-ODT) disintegrating tablet 4 mg  4 mg Oral Q6H PRN Jason A Berry, NP   4 mg at 06/04/16 0827  . QUEtiapine (SEROQUEL) tablet 300 mg  300 mg Oral TID Elizabeth Woods Oates, MD      . thiamine (B-1) injection 100 mg  100 mg Intramuscular Once Jason A Berry, NP      . thiamine (VITAMIN B-1) tablet 100 mg  100 mg Oral Daily Jason A Berry, NP   100 mg at 06/04/16 0820  . traZODone (DESYREL) tablet 50 mg  50 mg Oral QHS PRN Jason A Berry, NP   50 mg at 06/03/16 2111   PTA Medications: Prescriptions Prior to Admission  Medication Sig Dispense Refill Last Dose  . chlordiazePOXIDE (LIBRIUM) 5 MG capsule TK ONE C PO Q 6 H PRA  0   . Multiple   Vitamin (MULTIVITAMIN WITH MINERALS) TABS tablet Take 1 tablet by mouth daily.     . nicotine (NICODERM CQ - DOSED IN MG/24 HOURS) 21 mg/24hr patch Place 1 patch (21 mg total) onto the skin daily. For smoking cessation 28 patch 0   . PARoxetine (PAXIL) 20 MG tablet Take 1 tablet (20 mg total) by mouth daily. For depression 30 tablet 0   . QUEtiapine (SEROQUEL) 200 MG tablet Take by mouth.     . traZODone (DESYREL) 50 MG tablet Take by mouth.     . zolpidem (AMBIEN) 10 MG tablet Take 10 mg by mouth at bedtime as needed for sleep.     . clotrimazole (GYNE-LOTRIMIN) 1 % vaginal cream Place 1 Applicatorful vaginally at bedtime. 45 g 0   . gabapentin (NEURONTIN) 300 MG capsule Take 1 capsule (300 mg total) by mouth 3 (three) times daily. For agitation 90 capsule 0   . hydrOXYzine (ATARAX/VISTARIL) 25 MG tablet Take 1 tablet (25 mg) four times daily as needed: For anxiety 60 tablet 0   . ibuprofen (ADVIL,MOTRIN) 200 MG  tablet Take 1 tablet (200 mg total) by mouth every 6 (six) hours as needed for moderate pain. 30 tablet 0   . levothyroxine (SYNTHROID, LEVOTHROID) 75 MCG tablet Take 1 tablet (75 mcg total) by mouth daily before breakfast. For thyroid hormone replacement     . traZODone (DESYREL) 50 MG tablet Take 1 tablet (50 mg) at bedtime: For mood control 30 tablet 0     Treatment Modalities: Medication Management, Group therapy, Case management,  1 to 1 session with clinician, Psychoeducation, Recreational therapy.   Physician Treatment Plan for Primary Diagnosis: Major depressive disorder, recurrent episode with melancholic features (HCC) Long Term Goal(s): Improvement in symptoms so as ready for discharge   Short Term Goals: Ability to identify changes in lifestyle to reduce recurrence of condition will improve, Ability to identify and develop effective coping behaviors will improve, Compliance with prescribed medications will improve and Ability to identify triggers associated with substance abuse/mental health issues will improve  Medication Management: Evaluate patient's response, side effects, and tolerance of medication regimen.  Therapeutic Interventions: 1 to 1 sessions, Unit Group sessions and Medication administration.  Evaluation of Outcomes: Not Met  Physician Treatment Plan for Secondary Diagnosis: Principal Problem:   Major depressive disorder, recurrent episode with melancholic features (HCC) Active Problems:   Alcohol use disorder, moderate, dependence (HCC)   Hypothyroidism  Long Term Goal(s): Improvement in symptoms so as ready for discharge  Short Term Goals: Ability to identify changes in lifestyle to reduce recurrence of condition will improve, Ability to identify and develop effective coping behaviors will improve, Ability to maintain clinical measurements within normal limits will improve and Ability to identify triggers associated with substance abuse/mental health issues  will improve  Medication Management: Evaluate patient's response, side effects, and tolerance of medication regimen.  Therapeutic Interventions: 1 to 1 sessions, Unit Group sessions and Medication administration.  Evaluation of Outcomes: Not Met   RN Treatment Plan for Primary Diagnosis: Major depressive disorder, recurrent episode with melancholic features (HCC) Long Term Goal(s): Knowledge of disease and therapeutic regimen to maintain health will improve  Short Term Goals: Ability to remain free from injury will improve, Ability to disclose and discuss suicidal ideas, Ability to identify and develop effective coping behaviors will improve and Compliance with prescribed medications will improve  Medication Management: RN will administer medications as ordered by provider, will assess and evaluate patient's response and provide education to   patient for prescribed medication. RN will report any adverse and/or side effects to prescribing provider.  Therapeutic Interventions: 1 on 1 counseling sessions, Psychoeducation, Medication administration, Evaluate responses to treatment, Monitor vital signs and CBGs as ordered, Perform/monitor CIWA, COWS, AIMS and Fall Risk screenings as ordered, Perform wound care treatments as ordered.  Evaluation of Outcomes: Not Met   LCSW Treatment Plan for Primary Diagnosis: Major depressive disorder, recurrent episode with melancholic features ([place]) Long Term Goal(s): Safe transition to appropriate next level of care at discharge, Engage patient in therapeutic group addressing interpersonal concerns.  Short Term Goals: Engage patient in aftercare planning with referrals and resources, Increase emotional regulation, Identify triggers associated with mental health/substance abuse issues and Increase skills for wellness and recovery  Therapeutic Interventions: Assess for all discharge needs, 1 to 1 time with Social worker, Explore available resources and support  systems, Assess for adequacy in community support network, Educate family and significant other(s) on suicide prevention, Complete Psychosocial Assessment, Interpersonal group therapy.  Evaluation of Outcomes: Not Met   Progress in Treatment :  Attending groups: Continuing to assess  Participating in groups: Continuing to assess  Taking medication as prescribed: Yes, MD continuing to assess for appropriate medication regimen  Toleration medication: Yes  Family/Significant other contact made: Treatment team assessing for appropriate contacts  Patient understands diagnosis: Yes  Discussing patient identified problems/goals with staff: Yes  Medical problems stabilized or resolved: Yes  Denies suicidal/homicidal ideation: Treatment team continuing to asses  Issues/concerns per patient self-inventory: None reported  Other: N/A  New problem(s) identified: None reported at this time    New Short Term/Long Term Goal(s): None at this time    Discharge Plan or Barriers: Treatment team continuing to assess.    Reason for Continuation of Hospitalization: Anxiety Depression Medication stabilization Suicidal Ideations Withdrawal symptoms  Estimated Length of Stay: 3-5 days    Attendees:  Patient:  Physician: Dr. Sharolyn Douglas, MD 06/04/2016 9:30am  Nursing: Darrol Angel, Desma Paganini, RN 06/04/2016 9:30am  RN Care Manager: Lars Pinks, CM 06/04/2016 9:30am  Social Workers: Peri Maris, LCSW, Jamayia Croker, LCSW, 06/04/2016 9:30am  Nurse Pratictioners:   Scribe for Treatment Team: Tilden Fossa, [place] Clinical Social Worker [facility] [phone]

## 2016-06-04 NOTE — Progress Notes (Signed)
D: Pt presents anxious, mood labile and easily agitated. Pt fixated on taking meds constantly throughout the day and becomes aggressive if she doesn't have any scheduled or prn meds available. During medication administration, pt is focused on the next time she can have another dose of medication. Pt noted to be sedated around 1400 AEB slurred speech, unsteady gait and difficulty gathering her thoughts. Writer explained to pt that approval from the MD would be needed before administering Ativan. Pt then began to cry and was observed in the dayroom knocking over the tables and chairs. Pt continued to cry, stating "I need my ativan, all I do is drink, you don't understand". Writer spoke with Dr. Sharolyn Douglas, and Ativan not given at 1400. After speaking with Dr. Sharolyn Douglas and charge nurse, pt will be moved to [address] hall once bed is available. A: Medications reviewed with pt. Medications administered as ordered per MD. 15 minute checks performed for safety. Verbal support provided. Pt redirected as needed for inappropriate behaviors.  R: Pt requires reinforcement for tx.

## 2016-06-04 NOTE — Progress Notes (Signed)
Writer able to redirect pt this evening. No aggressive behaviors noted since earlier today. Writer spoke with charge nurse who agreed that pt will benefit from staying on 300 hall for substance abuse tx.

## 2016-06-05 MED ORDER — QUETIAPINE FUMARATE 200 MG PO TABS
200.0000 mg | ORAL_TABLET | Freq: Three times a day (TID) | ORAL | Status: DC
  Administered 2016-06-05 – 2016-06-06 (×2): 200 mg via ORAL
  Filled 2016-06-05 (×8): qty 1

## 2016-06-05 MED ORDER — HALOPERIDOL 2 MG PO TABS
2.0000 mg | ORAL_TABLET | Freq: Once | ORAL | Status: AC
  Administered 2016-06-05: 2 mg via ORAL
  Filled 2016-06-05 (×2): qty 1

## 2016-06-05 MED ORDER — LORAZEPAM 1 MG PO TABS
2.0000 mg | ORAL_TABLET | Freq: Once | ORAL | Status: AC
  Administered 2016-06-05: 2 mg via ORAL
  Filled 2016-06-05: qty 2

## 2016-06-05 MED ORDER — SELENIUM SULFIDE 1 % EX LOTN
TOPICAL_LOTION | Freq: Every day | CUTANEOUS | Status: DC
  Administered 2016-06-05 – 2016-06-07 (×3): via TOPICAL
  Filled 2016-06-05: qty 207
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
## 2016-06-05 NOTE — Progress Notes (Signed)
Do not administered 1700 meds, resulted to patient sedation.
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
## 2016-06-05 NOTE — Progress Notes (Signed)
D: The patient is laying in her bed with the lights off and is talking to herself. During the past hour, the patient has been active in her bedroom. She was found sitting on the floor in her room and was folding her towels and clothing. She changed her outfit , used the restroom, and kept moving around in her bedroom. She handed a bag of dirty clothing to this author without specifying what she wanted, but reminded this author who she was. Speech is difficult to comprehend at times.  A; Encouraged the patient to go back to bed.  R: The patient is awake and is talking to herself.

## 2016-06-05 NOTE — Progress Notes (Signed)
Consulted with MD, resulted in holding next Ativan dosage resulted in changed of behavior. Staff will continue to monitor, maintain safety, and meet needs.

## 2016-06-05 NOTE — Progress Notes (Signed)
Incidental note one-to-one order and no roommate order.  Patient continues to have agitation, exhibit confusion and some unsteady gait. Discussed with nursing and she will be placed on a one to one for monitoring her safety.  Assessment possible alcohol withdrawal with confusion and possible Wernicke's encephalopathy. We will continue with current Ativan and Seroquel orders but monitor closely because she at times appears oversedated which is worsening her confusion. See progress note for details.  Linard Millers, MD

## 2016-06-05 NOTE — Progress Notes (Signed)
D: The patient stuck her head out into the hallway a few minutes ago to get this author's attention. The patient did not have pants on. She asked if she was "IVC''D? In addition, she asked if she would be sent to jail or to the hospital in the event that she escaped. A: This author encouraged the patient to go back to bed.  R: The patient returned to her room for a short period of time and then walked up to the nurses station appropriately dressed and spoke with her nurse. The patient remembered this author's first name. The patient requested that this author come down to her room and talk to her for five minutes.

## 2016-06-05 NOTE — Progress Notes (Signed)
[place] discussed patient transfer to [address] hall CSW.  Tilden Fossa, LCSW Clinical Social Worker [facility] [phone]

## 2016-06-05 NOTE — Progress Notes (Signed)
Given patient simple task to complete, patient had to be redirected several times before completing task. Tasks were taking linen off bed and placing in dirty hamper; placing clothes in bag. Unsteady gait, risk for fall. Transferred patient to room 508 bed 1. Staff will continue to monitor and meet needs.

## 2016-06-05 NOTE — Progress Notes (Signed)
D: Less than ten minutes ago, the patient stuck her head out into the hallway to get this authors;s attention. The patient stated that she wanted to make a phone call.  A: The patient was reminded that it was just after midnight and that she should try to go to sleep.   R: The patient repeated her request to use the telephone despite being told that it was late at night. Patient was dressed in a shirt along with underwear, but was not covered in a pair of shorts or pants.

## 2016-06-05 NOTE — Progress Notes (Signed)
Patient A/O, no noted distress. Denies SI/HI/AVH. Minimal pain. Patient noted anxious, agitated, and irritable. Patient is requesting to move resulted in the noise on the unit. Patient was educated on the chain of command that is needed to relocate to another unit or room. Patient continues to be restless. [place]. Staff will continue to monitor, meet needs, and maintain safety.  Appetite is fair. Concentration is poor.
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
## 2016-06-05 NOTE — Progress Notes (Signed)
Pt has continued to be confused and agitated throughout the night.  About 45 minutes ago, she was asking to be taken "downstairs to the ED to see a friend who was having a heart attack".  Writer spent a few minutes trying to help pt understand that she was in [facility] and that there was no ED in the building.  Pt then became tearful and started talking about her "pawpaw" who died recently and how much she loved him.  Pt is having a difficult time relaxing enough to lie down to rest and is actively hallucinating.  Spoke with the provider on the unit who gave an order for pt to have an additional one time order of Ativan 2 mg and Haldol 2 mg to be given.  Meds were given, and pt was also given another cup of ginger ale for hydration.  Pt was encouraged to lie down in her bed and let the medicine work to relax her.  Pt continues to be monitored q15 minutes for safety.

## 2016-06-05 NOTE — Progress Notes (Signed)
Progress note: 1:1  S: Rounded on patient to monitor her progress while on a one-to-one. Patient relates "I need to go home." She states that she is. Here 4 days and should no longer be experiencing any withdrawal symptoms. She holds up her hands to demonstrate that she no longer has a tremor.  O:Patient is seated in the day room in the [address]. She appears restless and confused. Her affect appears fearful.  A/P patient continues to demonstrate very poor insight and judgment insisting that she be released, despite the fact that she still appears confused, tremulous and emotionally labile with episodes of agitation. For now we will continue to one-to-one and continue to monitor.  Linard Millers, MD

## 2016-06-05 NOTE — Progress Notes (Signed)
Approximately 35 minutes ago, this Pryor Curia observed the patient walking out of another bedroom. This Pryor Curia reminded her that she was not to go into another bedroom and that it was time to go to sleep. She stated that she needed a blanket. Upon returning to the patient's bedroom with a new blanket, the patient was found sitting on the edge of her bed with her skirt pulled halfway down her legs and was looking at the floor. She stated that she was looking for her "chapstick". The patient was encouraged to go back to bed.

## 2016-06-05 NOTE — Progress Notes (Signed)
Pt was asleep in her room at the beginning of the shift, but around 2030 she came out of her room requesting medications.  Pt's speech was slurred and she still seemed sedated.  Pt was encouraged to wait until 2100 for meds.  Pt became irritable with writer, but was able to control her behavior at that point.  Pt was encouraged to drink fluids d/t the medications she is taking, and given ginger ale.  She is focused on taking medications and frequently asking for her "prns" which she believes she can have any time she asks for them.  Writer explained the parameters of giving prns.  Pt was encouraged to use coping skills and other distractions instead of taking medications, but pt was not receptive.  Pt was given her hs meds shortly after 2100, and encouraged to go back to bed as she was still drowsy.  Support and encouragement offered.  Discharge plans are in process.  Pt considering long term treatment.  Safety maintained with q15 minute checks.

## 2016-06-05 NOTE — Progress Notes (Signed)
Pt has been in and out of her room since the 11 o'clock hour.  She reported to Probation officer a few minutes ago that she was having hallucinations of people in her room and that she was asking them for a cigarette.  She also had to be redirected by the MHT to go back to her room when she came out and started to enter another patient's room which happened to be a female patient.  This female reported earlier in the shift that the patient had come into their room a couple of times during the day and had to be redirected.  Pt continues to be monitored q15 minutes for safety.

## 2016-06-06 MED ORDER — QUETIAPINE FUMARATE 100 MG PO TABS
100.0000 mg | ORAL_TABLET | Freq: Three times a day (TID) | ORAL | Status: DC
  Administered 2016-06-06 – 2016-06-07 (×5): 100 mg via ORAL
  Filled 2016-06-06 (×9): qty 1

## 2016-06-06 MED ORDER — PERMETHRIN 1 % EX LOTN
TOPICAL_LOTION | Freq: Once | CUTANEOUS | Status: AC
  Administered 2016-06-06: 10:00:00 via TOPICAL
  Filled 2016-06-06: qty 59

## 2016-06-06 MED ORDER — TRAZODONE HCL 50 MG PO TABS
50.0000 mg | ORAL_TABLET | Freq: Once | ORAL | Status: AC
  Administered 2016-06-06: 50 mg via ORAL
  Filled 2016-06-06 (×2): qty 1

## 2016-06-06 MED ORDER — LORAZEPAM 1 MG PO TABS
1.0000 mg | ORAL_TABLET | Freq: Two times a day (BID) | ORAL | Status: DC
  Administered 2016-06-06 – 2016-06-07 (×2): 1 mg via ORAL
  Filled 2016-06-06 (×2): qty 1

## 2016-06-06 NOTE — Progress Notes (Signed)
1:1 NOTE  Dawn Foley is currently asleep in her room. 1:1 Sitter remains at bedside.

## 2016-06-06 NOTE — Progress Notes (Signed)
[place] [facility] 1:1 Observation Documentation  For the first (8) hours following discontinuation of 1:1 precautions, a progress note entry by nursing staff should be documented at least every 2 hours, reflecting the patient's behavior, condition, mood, and conversation.  Use the progress notes for additional entries.  Time 1:1 discontinued:  [id number]  Patient's Behavior:Calm at present. Informed about need to be placed on contact precaution and is in agreement.   Patient's Condition: Safety maintained on Q 15 minutes checks without self harm gestures. No fall event noted.   Patient's Conversation: "you guys are going to put me on this  precaution now? Well I'll do it if I have to".  [place] [employer] 06/06/2016, 1635

## 2016-06-06 NOTE — Progress Notes (Signed)
[place] [facility] 1:1 Observation Documentation  For the first (8) hours following discontinuation of 1:1 precautions, a progress note entry by nursing staff should be documented at least every 2 hours, reflecting the patient's behavior, condition, mood, and conversation.  Use the progress notes for additional entries.  Time 1:1 discontinued:  0835  Patient's Behavior:  Pt is calm and interacting with peers in the day area. No compliant with precaution.  Patient's Condition:  Stable and alert, no falls at this time  Patient's Conversation: Pt stated she would like to go outside for fresh air.   Dawn Foley 06/06/2016, 4:34 PM

## 2016-06-06 NOTE — Progress Notes (Signed)
[place] [facility] 1:1 Observation Documentation  For the first (8) hours following discontinuation of 1:1 precautions, a progress note entry by nursing staff should be documented at least every 2 hours, reflecting the patient's behavior, condition, mood, and conversation.  Use the progress notes for additional entries.  Time 1:1 discontinued:  0835  Patient's Behavior:  Pt is calm  Patient's Condition:  Denies SI, stable, no sings of distress.  Patient's Conversation:  Pt stated she is raedy for discharge.  Wolfgang Phoenix 06/06/2016, 3:38 PM

## 2016-06-06 NOTE — Progress Notes (Signed)
[place] [facility] 1:1 Observation Documentation  For the first (8) hours following discontinuation of 1:1 precautions, a progress note entry by nursing staff should be documented at least every 2 hours, reflecting the patient's behavior, condition, mood, and conversation.  Use the progress notes for additional entries.  Time 1:1 discontinued:  0835  Patient's Behavior:  Pt is calm and pleased to be off 1:1, pt denies SI/AVH. Pt steady on her feet  And fall has been observed or reported  Patient's Condition:   Pt denies SI/HI, AVH. Pt steady on her feet, no fall reported or observed.  Patient's Conversation:  Pt verbalized feeling more alert today than yesterday.  Wolfgang Phoenix 06/06/2016, 11:10 AM

## 2016-06-06 NOTE — Progress Notes (Signed)
Dawn Foley is currently asleep in her room. 1:1 Sitter remains at bedside.

## 2016-06-06 NOTE — BHH Group Notes (Signed)
[facility] LCSW Group Therapy  06/06/2016 2:40 PM   Type of Therapy:  Group Therapy   Participation Level:  Engaged  Participation Quality:  Attentive  Affect:  Appropriate   Cognitive:  Alert   Insight:  Engaged  Engagement in Therapy:  Improving   Modes of Intervention:  Education, Exploration, Socialization   Summary of Progress/Problems: Rahel joined group briefly, but left shortly after.   Shanon Brow from the [place] was here to tell his story of recovery, inform patients about [facility] and play his guitar.   Radonna Ricker 06/06/2016 2:40 PM

## 2016-06-06 NOTE — Progress Notes (Addendum)
[place] [facility] 1:1 Observation Documentation  For the first (8) hours following discontinuation of 1:1 precautions, a progress note entry by nursing staff should be documented at least every 2 hours, reflecting the patient's behavior, condition, mood, and conversation.  Use the progress notes for additional entries.  Time 1:1 discontinued:  0835  Patient's Behavior:  Pt is agitated at this time, pt three the water pitcher on the floor when she was explained that she is being on put on contact precaution due lice on her hair.  Patient's Condition:  Pt stable on her feet, speech clear and not drowsy.  Patient's Conversation:  Pt does not want to talk to anyone at this time.  Wolfgang Phoenix 06/06/2016, 12:24 PM

## 2016-06-06 NOTE — Progress Notes (Signed)
[place] [facility] 1:1 Observation Documentation  For the first (8) hours following discontinuation of 1:1 precautions, a progress note entry by nursing staff should be documented at least every 2 hours, reflecting the patient's behavior, condition, mood, and conversation.  Use the progress notes for additional entries.  Time 1:1 discontinued:  0835  Patient's Behavior:  Pt  Is calm, pt is not cooperative as far as contact precaution is concerned. Pt refused to stay in the room, pt also demanding to out and to the cafeteria. Patient's Condition: Pt stable, no sign of distress. No fall observed or reported.   Patient's Conversation:  Pt feel happy of not being followed around. Dawn Foley 06/06/2016, 12:30 PM

## 2016-06-06 NOTE — Progress Notes (Signed)
1:1 NOTE Dawn Foley appears to be overmedicated. She is having difficulty keeping her eyes open while she is currently requesting additional medication. She is repeatedly asking the same questions over and over again about her belongings as well as her shampoo that was prescribed. She is disorganized with her speech and her speech is difficult to understand at times. She is able to ambulate without assistance. Remains 1:1. Will continue to monitor Q15 minutes for safety.

## 2016-06-06 NOTE — Progress Notes (Signed)
[facility] MD Progress Note  06/06/2016 8:38 AM  Patient Active Problem List   Diagnosis Date Noted  . Hypothyroidism 06/02/2016  . Alcohol use disorder, moderate, dependence ([place]) 04/07/2016  . Major depressive disorder, recurrent episode with melancholic features ([place]) [id number]  . Alcohol dependence ([place]) 05/14/2012    Diagnosis: Alcohol use disorder, severe with withdrawal with delirium  Subjective: Patient reports she feels much better today and feels she is done with withdrawals. She is hoping to leave the hospital soon. She states that she feels the Ativan and gabapentin are helpful but the Seroquel and trazodone have contributed to making her confused. Does not endorse any hallucinations suicidal or homicidal ideation at this time. She does have a number of requests besides being discharged. She states she is missing some hygiene items and clothing. She would like some Carmex for her lips. She states she would feel safe if we discontinued her one to one.  Objective: Well developed well nourished female who today appears more focused and brighter than yesterday. She holds out her hands to demonstrate that she does not have a tremor. Her gait does appear to be fairly steady. There are no signs of nystagmus or ataxia noted. Her mood does remain anxious and affect is somewhat anxious. She is oriented to day of the week and place. It appears she is not sure however which reports she was on prior to coming to the [facility] [address] as she seems to think it is the [facility]. No acute agitation suicidal, homicidal or psychotic behavior noted at present.  Review of systems otherwise negative.  Nursing reports that they are not sure if she has nits on her hair. Examination yesterday did not demonstrate nits.   Current Facility-Administered Medications (Endocrine & Metabolic):  .  levothyroxine (SYNTHROID, LEVOTHROID) tablet 75 mcg       Current Facility-Administered Medications (Analgesics):  .   acetaminophen (TYLENOL) tablet 650 mg     Current Facility-Administered Medications (Other):  .  alum & mag hydroxide-simeth (MAALOX/MYLANTA) 200-200-20 MG/5ML suspension 30 mL .  benztropine (COGENTIN) tablet 0.5 mg .  feeding supplement (ENSURE ENLIVE) (ENSURE ENLIVE) liquid 237 mL .  gabapentin (NEURONTIN) capsule 300 mg .  haloperidol (HALDOL) tablet 5 mg .  lip balm (CARMEX) ointment .  LORazepam (ATIVAN) tablet 1 mg .  LORazepam (ATIVAN) tablet 1 mg .  LORazepam (ATIVAN) tablet 1 mg .  magnesium hydroxide (MILK OF MAGNESIA) suspension 30 mL .  multivitamin with minerals tablet 1 tablet .  nicotine (NICODERM CQ - dosed in mg/24 hours) patch 21 mg .  QUEtiapine (SEROQUEL) tablet 100 mg .  selenium sulfide (SELSUN) 1 % shampoo .  thiamine (B-1) injection 100 mg .  thiamine (VITAMIN B-1) tablet 100 mg .  traZODone (DESYREL) tablet 50 mg  No current outpatient prescriptions on file.  Vital Signs:Blood pressure 132/86, pulse (!) 108, temperature 98.4 F (36.9 C), temperature source Oral, resp. rate 17, height 5\' 4"  (1.626 m), weight 62.6 kg (138 lb).    Lab Results:  Results for orders placed or performed during the hospital encounter of 06/01/16 (from the past 48 hour(s))  Urinalysis, Routine w reflex microscopic (not at [facility])     Status: Abnormal   Collection Time: 06/04/16 10:19 AM  Result Value Ref Range   Color, Urine YELLOW YELLOW   APPearance CLEAR CLEAR   Specific Gravity, Urine 1.003 (L) 1.005 - 1.030   pH 6.5 5.0 - 8.0   Glucose, UA NEGATIVE NEGATIVE mg/dL  Hgb urine dipstick NEGATIVE NEGATIVE   Bilirubin Urine NEGATIVE NEGATIVE   Ketones, ur NEGATIVE NEGATIVE mg/dL   Protein, ur NEGATIVE NEGATIVE mg/dL   Nitrite NEGATIVE NEGATIVE   Leukocytes, UA NEGATIVE NEGATIVE    Comment: MICROSCOPIC NOT DONE ON URINES WITH NEGATIVE PROTEIN, BLOOD, LEUKOCYTES, NITRITE, OR GLUCOSE <1000 mg/dL. Performed at [facility]     Physical Findings: AIMS:  Facial and Oral Movements Muscles of Facial Expression: None, normal Lips and Perioral Area: None, normal Jaw: None, normal Tongue: None, normal,Extremity Movements Upper (arms, wrists, hands, fingers): None, normal Lower (legs, knees, ankles, toes): None, normal, Trunk Movements Neck, shoulders, hips: None, normal, Overall Severity Severity of abnormal movements (highest score from questions above): None, normal Incapacitation due to abnormal movements: None, normal Patient's awareness of abnormal movements (rate only patient's report): No Awareness, Dental Status Current problems with teeth and/or dentures?: No Does patient usually wear dentures?: No  CIWA:  CIWA-Ar Total: 4 COWS:      Assessment/Plan:The patient is doing somewhat better at present and we will discontinue the one-to-one. She may still have some delirium and she may still wax and wane in her mental status throughout the day. We have informed her we will continue to monitor her but we will reduce the standing dose of Ativan and the standing dose of Seroquel today and monitor response. Currently she does not seem to show any acute signs of Wernicke's encephalopathy. We will recheck her platelets and a chem 14 today.  Linard Millers, MD 06/06/2016, 8:38 AM

## 2016-06-06 NOTE — Progress Notes (Signed)
Pt is off contact precaution as per Dr Sharolyn Douglas. Pt was treated with shampoo for lice, pt tolerated well and verbalized feeling good after wards. Pt's appreciative and happy to be off contact precaution.

## 2016-06-06 NOTE — Progress Notes (Signed)
Patient's hair was examined for possible mitts today. Examination showed that there were small white particles attached to hair follicles although they did appear fairly easy to remove. At this time we will treat her with a shampoo for lice in order to remove any possibility of lice infection. After completing the treatment patient may be taken off of contact precautions.  Linard Millers, MD

## 2016-06-06 NOTE — Progress Notes (Signed)
Contact Precautions reinstated per protocol.  Per policy, patient is to be on contact precautions for 24 hours following effective treatment.

## 2016-06-07 DIAGNOSIS — F10931 Alcohol use, unspecified with withdrawal delirium: Secondary | ICD-10-CM | POA: Clinically undetermined

## 2016-06-07 DIAGNOSIS — F10231 Alcohol dependence with withdrawal delirium: Secondary | ICD-10-CM | POA: Clinically undetermined

## 2016-06-07 MED ORDER — GABAPENTIN 300 MG PO CAPS: 300.0000 mg | ORAL_CAPSULE | Freq: Four times a day (QID) | ORAL | 0 refills | Status: DC

## 2016-06-07 MED ORDER — LEVOTHYROXINE SODIUM 75 MCG PO TABS: 75.0000 ug | ORAL_TABLET | Freq: Every day | ORAL | Status: DC

## 2016-06-07 MED ORDER — HYDROXYZINE HCL 50 MG PO TABS
50.0000 mg | ORAL_TABLET | Freq: Three times a day (TID) | ORAL | Status: DC | PRN
  Administered 2016-06-07: 50 mg via ORAL
  Filled 2016-06-07: qty 1

## 2016-06-07 MED ORDER — NICOTINE 21 MG/24HR TD PT24: 21.0000 mg | MEDICATED_PATCH | Freq: Every day | TRANSDERMAL | 0 refills | Status: DC

## 2016-06-07 MED ORDER — TRAZODONE HCL 50 MG PO TABS: 50.0000 mg | ORAL_TABLET | Freq: Every evening | ORAL | 0 refills | Status: DC | PRN

## 2016-06-07 MED ORDER — HYDROXYZINE HCL 50 MG PO TABS: 50.0000 mg | ORAL_TABLET | Freq: Three times a day (TID) | ORAL | 0 refills | Status: DC | PRN

## 2016-06-07 MED ORDER — LIP MEDEX EX OINT: TOPICAL_OINTMENT | CUTANEOUS | 0 refills | Status: DC | PRN

## 2016-06-07 MED ORDER — QUETIAPINE FUMARATE 100 MG PO TABS: 100.0000 mg | ORAL_TABLET | Freq: Three times a day (TID) | ORAL | 0 refills | Status: DC

## 2016-06-07 MED ORDER — ADULT MULTIVITAMIN W/MINERALS CH: 1.0000 | ORAL_TABLET | Freq: Every day | ORAL | Status: DC

## 2016-06-07 MED ORDER — LORAZEPAM 1 MG PO TABS: 1.0000 mg | ORAL_TABLET | Freq: Four times a day (QID) | ORAL | Status: DC | PRN
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
## 2016-06-07 NOTE — Progress Notes (Signed)
   D: Pt was anxious and somewhat tearful during the assessment. Informed the writer that things were "good" until she had to come back into her room. Writer explained the reason for the need of the contact precaution. Also explained that if the treatment was in the am, the order for her precaution should also end in the a.m. Stated her day was "good because she's not having any detox or anything". Writer was encouraged to follow rules for precaution.    A:  Support and encouragement was offered. 15 min checks continued for safety.  R: Pt remains safe.

## 2016-06-07 NOTE — Progress Notes (Signed)
  [facility] Adult Case Management Discharge Plan :  Will you be returning to the same living situation after discharge:  No. At discharge, do you have transportation home?: Yes,  mother Do you have the ability to pay for your medications: Yes,  MCD  Release of information consent forms completed and in the chart;  Patient's signature needed at discharge.  Patient to Follow up at: Follow-up Information    [facility] Follow up on 06/11/2016.   Why:  Monday at 9:00AM for your hospital follow up appointment.   Contact information: [address]  [place] [phone]       ADS .   Why:  Go to the walk-in clinic M, W 1-3 Or Fr 8-10 for your assessment for services Contact information: [place] [phone]          Next level of care provider has access to [place] and Suicide Prevention discussed: Yes,  yes  Have you used any form of tobacco in the last 30 days? (Cigarettes, Smokeless Tobacco, Cigars, and/or Pipes): Yes  Has patient been referred to the Quitline?: Patient refused referral  Patient has been referred for addiction treatment: Yes  Trish Mage 06/07/2016, 4:19 PM

## 2016-06-07 NOTE — BHH Group Notes (Signed)
Type of Therapy: Group Therapy   Participation Level: Engaged  Participation Quality: Attentive  Affect: Flat    Cognitive: Alert   Insight: Engaged  Engagement in Therapy: Improving   Modes of Intervention: Education, Exploration, Socialization   [employer], spelling the word obstacle. Group members were asked to identify an obstacle/obstacles they are currently facing and also first steps in overcoming those obstacles   Summary of Progress/Problems: Am[place] was invited to group, chose not to attend.

## 2016-06-07 NOTE — Progress Notes (Signed)
[facility] MD Progress Note  06/07/2016 11:18 AM  Patient Active Problem List   Diagnosis Date Noted  . Alcohol withdrawal delirium ([place]) 06/07/2016  . Hypothyroidism 06/02/2016  . Alcohol use disorder, severe, dependence ([place]) 04/07/2016  . Major depressive disorder, recurrent episode with melancholic features ([place]) [id number]  . Alcohol dependence ([place]) 05/14/2012    Diagnosis: Alcohol dependence, severe with withdrawal with delirium  Subjective: Patient states she is feeling much better today. She reports that she would like to be discharged ASAP. She is able to converse coherently today and relates that her most recent psychiatric history is that she was discharged from prison in May 2017 and began "binging." She relates that her incarceration was substance related in that she was intoxicated and she was driving a stolen car because her companions were even more intoxicated than she was. She states that she does have chronic problems with anxiety and panic attacks and she thinks this does contribute to her drinking. She reports that when she was on Klonopin in the past she was able to reduce her drinking from about a half gallon a day to "1-2 beers." However she states that she was not able to remain on the Klonopin due to her substance use. Her current plan is to move in with her grandmother whom she describes as being "the devil" who will keep a close eye on her and also to get a job so that she has less free time. She does mention possibly following up with AA especially if she can find a meeting with younger people. She recalls feeling delirious earlier during the hospitalization for example reaching for a cigarette that wasn't there or talking to people that weren't there. Today she denies any current suicidal or homicidal ideation, plan or intent she denies any auditory or visual hallucinations and does not feel she is experiencing too many physical withdrawal symptoms. She has been on contact  precautions over the past day secondary to treatment for possible lice. She is anxious to get off of contact precautions and possibly move back to the 300s all.  Nursing reports that she will be able to come off contact precautions 24 hours after her treatment if it appears her hair is clear.  Objective: Well developed well nourished white female in no apparent distress who is pleasant, coherent and cooperative and somewhat anxious in affect. She describes her mood as good. She is well groomed. She denies any suicidal or homicidal ideation, plan or intent no auditory or visual hallucinations no paranoia or delusions. Speech and motor within normal limits. Thought processes are coherent and appropriate. She is oriented to person place and day date and time. Insight is somewhat limited and judgment is only fair. IQ appears in the average range.  Review of MAR patient did use when necessary Ativan yesterday with no side effects.   Current Facility-Administered Medications (Endocrine & Metabolic):  .  levothyroxine (SYNTHROID, LEVOTHROID) tablet 75 mcg       Current Facility-Administered Medications (Analgesics):  .  acetaminophen (TYLENOL) tablet 650 mg     Current Facility-Administered Medications (Other):  .  alum & mag hydroxide-simeth (MAALOX/MYLANTA) 200-200-20 MG/5ML suspension 30 mL .  benztropine (COGENTIN) tablet 0.5 mg .  feeding supplement (ENSURE ENLIVE) (ENSURE ENLIVE) liquid 237 mL .  gabapentin (NEURONTIN) capsule 300 mg .  haloperidol (HALDOL) tablet 5 mg .  lip balm (CARMEX) ointment .  LORazepam (ATIVAN) tablet 1 mg .  magnesium hydroxide (MILK OF MAGNESIA) suspension 30 mL .  multivitamin with minerals tablet 1 tablet .  nicotine (NICODERM CQ - dosed in mg/24 hours) patch 21 mg .  QUEtiapine (SEROQUEL) tablet 100 mg .  selenium sulfide (SELSUN) 1 % shampoo .  thiamine (B-1) injection 100 mg .  thiamine (VITAMIN B-1) tablet 100 mg .  traZODone (DESYREL) tablet 50  mg  No current outpatient prescriptions on file.  Vital Signs:Blood pressure 120/72, pulse (!) 111, temperature 98.4 F (36.9 C), temperature source Oral, resp. rate 17, height 5\' 4"  (1.626 m), weight 62.6 kg (138 lb).    Lab Results: No results found for this or any previous visit (from the past 48 hour(s)).  Physical Findings: AIMS: Facial and Oral Movements Muscles of Facial Expression: None, normal Lips and Perioral Area: None, normal Jaw: None, normal Tongue: None, normal,Extremity Movements Upper (arms, wrists, hands, fingers): None, normal Lower (legs, knees, ankles, toes): None, normal, Trunk Movements Neck, shoulders, hips: None, normal, Overall Severity Severity of abnormal movements (highest score from questions above): None, normal Incapacitation due to abnormal movements: None, normal Patient's awareness of abnormal movements (rate only patient's report): No Awareness, Dental Status Current problems with teeth and/or dentures?: No Does patient usually wear dentures?: No  CIWA:  CIWA-Ar Total: 3 COWS:      Assessment/Plan: Ms. Acosta appears to have improved in her withdrawal symptoms. She currently does not exhibit or complaint of delirium. She would like to leave the hospital and it was discussed with her that we would continue to monitor her today however with possible discharge later in the afternoon or early tomorrow morning if her present course continuous. Her hair was examined by our student PA and observed by me and it does appear that she is currently appropriate to come off of contact precautions. Ativan will be changed to in order to only administer CIWA what is greater equal to 10. Social work is helping her arrange some follow-up options as well. It is recommended that she pursue further treatment for her anxiety and her substance use disorder as these continue to negatively impact her life.  Linard Millers, MD 06/07/2016, 11:18 AM

## 2016-06-07 NOTE — Discharge Summary (Signed)
Physician Discharge Summary Note  Patient:  Dawn Foley is an 30 y.o., female  MRN:  [id number]  DOB:  1986/02/18  Patient phone:  [phone] (home)   Patient address:   [address] [place] [address],   Date of Admission:  06/01/2016  Date of Discharge: 06/07/16  Reason for Admission:  Alcohol detox/mood stabilization treatments.  Discharge Diagnoses: Principal Problem:   Alcohol withdrawal delirium ([facility]) Active Problems:   Alcohol use disorder, severe, dependence ([place])   Major depressive disorder, recurrent episode with melancholic features ([place])   Hypothyroidism  Review of Systems  Constitutional: Negative.   HENT: Negative.   Eyes: Negative.   Respiratory: Negative.   Cardiovascular: Negative.   Gastrointestinal: Negative.   Genitourinary: Negative.   Musculoskeletal: Negative.   Skin: Negative.   Neurological: Negative.   Endo/Heme/Allergies: Negative.   Psychiatric/Behavioral: Positive for substance abuse (Alcoholism). Negative for depression, hallucinations, memory loss and suicidal ideas. The patient is nervous/anxious (Stabilized with medication prior to discharge) and has insomnia (Stabilized with medication prior to discharge).    Past Medical History:  Diagnosis Date  . Alcoholism ([place])   . Depression   . Hepatitis    Level of Care:  OP  Hospital Course: Pt to [facility] ED yesterday under IVC, highly intoxicated, reports having thoughts to harm herself. Today, pt still reports having SI. Pt denies current plan. Pt reports that her "paw-paw" died 6 months ago and, since then, Dawn Foley doesn't want to live. Pt is tearful and very shaky. Pt reports drinking since age 19. Pt indicates that Dawn Foley is not physically able to stop drinking. Pt has several IP admissions for her alcoholism and also indicates that Dawn Foley's been to several rehab facilities. Pt states that Dawn Foley has never been able to stay sober outside of being in an IP admission or rehab.   Upon  her admission in this hospital and after admission assessment, it was determined that Dawn Foley will need detoxification treatment to rid her system off the excess alcohol & to combat the withdrawal symptoms as well. Dawn Foley did admit on admission of her chronic alcoholism & how it has affected her life negatively. Dawn Foley has not been able to achieve sobriety on her own. Dawn Foley received Ativan detox protocols. This plan of detoxification treatment gave Dawn Foley a much cleaner detoxification treatment without a lingering adverse effects in her system. Dawn Foley has a history of hepatitis C . Per history of lab reports, her liver enzymes were elevated.  Besides the detoxification treatment protocol, Dawn Foley also received medication management for her mood symptoms. Dawn Foley was medicated & discharged on; Gabapentin 300 mg for agitation, Hydroxyzine 25 mg prn for anxiety, Nicotine patch 21 mg for smoking cessation & Trazodone 50 mg for insomnia. Dawn Foley was enrolled in the group counseling sessions and activities where Dawn Foley was counseled, taught and  learned coping skills that should help her after discharge to cope better and manage her substance abuse issues for a much sustainable sobriety.   Dawn Foley was also enrolled/attended AA/NA meetings being offered and held on this unit. Dawn Foley has other previous or identifiable medical conditions that required treatment and or monitoring. Dawn Foley was resumed on all her pertinent home medications for those health issues. Dawn Foley tolerated her treatment regimen without any significant adverse effects and or reactions reported.  Dawn Foley met with her provider this am for follow-up care, her reason for admission, present symptoms, substance abuse issues, response to treatment and discharge plans discussed. Dawn Foley endorsed  that Dawn Foley is doing well and stable for discharge to pursue the next phase of her substance abuse treatment on an outpatient basis. Dawn Foley will follow-up care on an outpatient basis as  noted below.  Dawn Foley was also instructed/encouraged to join/attend AA meetings being offered and held within her community for a much needed support from peers who are and had gone through the same experience. Dawn Foley is currently being discharged to her home. Upon discharge, Dawn Foley adamantly denies suicidal, homicidal ideations, auditory, visual hallucinations, delusional thinking & or substance withdrawal symptoms. Dawn Foley left [facility] with all personal belongings in no apparent distress. Transportation per family (mother).  Consults:  psychiatry  Discharge Vitals:   Blood pressure (!) 107/59, pulse (!) 116, temperature 98.4 F (36.9 C), temperature source Oral, resp. rate 17, height 5' 4"  (1.626 m), weight 62.6 kg (138 lb). Body mass index is 23.69 kg/m.  Lab Results:   No results found for this or any previous visit (from the past 72 hour(s)). Physical Findings: AIMS: Facial and Oral Movements Muscles of Facial Expression: None, normal Lips and Perioral Area: None, normal Jaw: None, normal Tongue: None, normal,Extremity Movements Upper (arms, wrists, hands, fingers): None, normal Lower (legs, knees, ankles, toes): None, normal, Trunk Movements Neck, shoulders, hips: None, normal, Overall Severity Severity of abnormal movements (highest score from questions above): None, normal Incapacitation due to abnormal movements: None, normal Patient's awareness of abnormal movements (rate only patient's report): No Awareness, Dental Status Current problems with teeth and/or dentures?: No Does patient usually wear dentures?: No  CIWA:  CIWA-Ar Total: 3 COWS:     Psychiatric Specialty Exam: See Psychiatric Specialty Exam and Suicide Risk Assessment completed by Attending Physician prior to discharge.  Discharge destination:  Home  Is patient on multiple antipsychotic therapies at discharge:  No   Has Patient had three or more failed trials of antipsychotic monotherapy by history:  No  Recommended  Plan for Multiple Antipsychotic Therapies: NA    Medication List    STOP taking these medications   chlordiazePOXIDE 5 MG capsule Commonly known as:  LIBRIUM   clotrimazole 1 % vaginal cream Commonly known as:  GYNE-LOTRIMIN   ibuprofen 200 MG tablet Commonly known as:  ADVIL,MOTRIN   PARoxetine 20 MG tablet Commonly known as:  PAXIL   zolpidem 10 MG tablet Commonly known as:  AMBIEN     TAKE these medications     Indication  gabapentin 300 MG capsule Commonly known as:  NEURONTIN Take 1 capsule (300 mg total) by mouth 4 (four) times daily. For agitation What changed:  when to take this  Indication:  Agitation   hydrOXYzine 50 MG tablet Commonly known as:  ATARAX/VISTARIL Take 1 tablet (50 mg total) by mouth 3 (three) times daily as needed for anxiety. What changed:  medication strength  how much to take  how to take this  when to take this  reasons to take this  additional instructions  Indication:  Anxiety   levothyroxine 75 MCG tablet Commonly known as:  SYNTHROID, LEVOTHROID Take 1 tablet (75 mcg total) by mouth daily before breakfast. For thyroid hormone replacement  Indication:  Underactive Thyroid   lip balm ointment Apply topically as needed for lip care.  Indication:  Lip care   multivitamin with minerals Tabs tablet Take 1 tablet by mouth daily. For low Vitamin What changed:  additional instructions  Indication:  Low Vitamin   nicotine 21 mg/24hr patch Commonly known as:  NICODERM CQ - dosed in mg/24  hours Place 1 patch (21 mg total) onto the skin daily. For smoking cessation  Indication:  Nicotine Addiction   QUEtiapine 100 MG tablet Commonly known as:  SEROQUEL Take 1 tablet (100 mg total) by mouth 3 (three) times daily. For mood control What changed:  medication strength  how much to take  when to take this  additional instructions  Indication:  Mood control   traZODone 50 MG tablet Commonly known as:  DESYREL Take 1  tablet (50 mg total) by mouth at bedtime as needed for sleep. What changed:  how much to take  how to take this  when to take this  reasons to take this  additional instructions  Another medication with the same name was removed. Continue taking this medication, and follow the directions you see here.  Indication:  Trouble Sleeping      Follow-up Information    [facility] Follow up on 06/11/2016.   Why:  Monday at 9:00AM for your hospital follow up appointment.   Contact information: [address]  [place] [phone]       [facility] .   Why:  Go to the walk-in clinic M, W 1-3 Or Fr 8-10 for your assessment for services Contact information: [place] [phone]         Follow-up recommendations:  Activity:  As tolerated Diet: As recommended by your primary care doctor. Keep all scheduled follow-up appointments as recommended.   Comments:  Patient is instructed prior to discharge to: Take all medications as prescribed by his/her mental healthcare provider. Report any adverse effects and or reactions from the medicines to his/her outpatient provider promptly. Patient has been instructed & cautioned: To not engage in alcohol and or illegal drug use while on prescription medicines. In the event of worsening symptoms, patient is instructed to call the crisis hotline, 911 and or go to the nearest ED for appropriate evaluation and treatment of symptoms. To follow-up with his/her primary care provider for your other medical issues, concerns and or health care needs.   Signed: Encarnacion Slates, FNP, PMHNP-BC 06/07/2016, 2:37 PM

## 2016-06-07 NOTE — Progress Notes (Signed)
Incidental Note:  Patient recently treated with nix. On contact precaution. Has been 24 hours since treatment.   Physical exam: No lice or Nits noted.  Diffuse dry scalp with dandruff.   Patient appear to be clear of infectious lice and may discontinue contact precautions.    Approve reviewed and I have met patient, agree with plan  Linard Millers, MD

## 2016-06-07 NOTE — Progress Notes (Signed)
Nursing Note 06/07/2016 [id number]  Data Reports sleeping fair with PRN sleep med.  Rates depression 3/10, hopelessness 3/10, and anxiety 6/10. Affect appropriate mood "anxious."  Denies HI, SI, AVH.  Patient was taken off of contact precautions after assessed by PA student/MD.  Received discharge orders.     Action Spoke with patient 1:1, nurse offered support to patient throughout shift.  Reviewed medications, discharge instructions, and follow up appointments with patient. Medication scripts handed to patient and reviewed.  Paperwork, AVS, SRA, and transition record handed to patient.   Escorted off of unit at 1740. Belongings returned per belongings form.  Discharged to lobby where ride was awaiting patient.     Response Patient verbalized understanding of discharge instructions. Agrees to contact someone or 911 if she has thoughts/intent to harm self or others. To follow up per AVS.

## 2016-06-07 NOTE — Tx Team (Signed)
Interdisciplinary Treatment and Diagnostic Plan Update  06/07/2016 Time of Session: 9:30am Vladimir FasterCassie M Overall MRN: [id number]  Principal Diagnosis: Major depressive disorder, recurrent episode with melancholic features (HCC)  Secondary Diagnoses: Principal Problem:   Major depressive disorder, recurrent episode with melancholic features (HCC) Active Problems:   Alcohol use disorder, moderate, dependence (HCC)   Hypothyroidism   Current Medications:  Current Facility-Administered Medications  Medication Dose Route Frequency Provider Last Rate Last Dose  . acetaminophen (TYLENOL) tablet 650 mg  650 mg Oral Q6H PRN Jackelyn PolingJason A Berry, NP   650 mg at 06/06/16 1628  . alum & mag hydroxide-simeth (MAALOX/MYLANTA) 200-200-20 MG/5ML suspension 30 mL  30 mL Oral Q4H PRN Jackelyn PolingJason A Berry, NP      . benztropine (COGENTIN) tablet 0.5 mg  0.5 mg Oral TID PRN Jomarie LongsSaramma Eappen, MD   0.5 mg at 06/04/16 2242  . feeding supplement (ENSURE ENLIVE) (ENSURE ENLIVE) liquid 237 mL  237 mL Oral TID BM Oneta Rackanika N Lewis, NP   237 mL at 06/07/16 1000  . gabapentin (NEURONTIN) capsule 300 mg  300 mg Oral QID Jomarie LongsSaramma Eappen, MD   300 mg at 06/07/16 0833  . haloperidol (HALDOL) tablet 5 mg  5 mg Oral Q6H PRN Oneta Rackanika N Lewis, NP   5 mg at 06/06/16 1909  . levothyroxine (SYNTHROID, LEVOTHROID) tablet 75 mcg  75 mcg Oral QAC breakfast Jomarie LongsSaramma Eappen, MD   75 mcg at 06/07/16 0643  . lip balm (CARMEX) ointment   Topical PRN Oneta Rackanika N Lewis, NP      . LORazepam (ATIVAN) tablet 1 mg  1 mg Oral Q6H PRN Acquanetta SitElizabeth Woods Oates, MD   1 mg at 06/06/16 2353  . magnesium hydroxide (MILK OF MAGNESIA) suspension 30 mL  30 mL Oral Daily PRN Jackelyn PolingJason A Berry, NP      . multivitamin with minerals tablet 1 tablet  1 tablet Oral Daily Jackelyn PolingJason A Berry, NP   1 tablet at 06/07/16 [phone]  . nicotine (NICODERM CQ - dosed in mg/24 hours) patch 21 mg  21 mg Transdermal Daily Acquanetta SitElizabeth Woods Oates, MD   21 mg at 06/07/16 0837  . QUEtiapine (SEROQUEL) tablet 100 mg  100  mg Oral TID Acquanetta SitElizabeth Woods Oates, MD   100 mg at 06/07/16 [id number]  . selenium sulfide (SELSUN) 1 % shampoo   Topical Daily Acquanetta SitElizabeth Woods Oates, MD      . thiamine (B-1) injection 100 mg  100 mg Intramuscular Once Jackelyn PolingJason A Berry, NP      . thiamine (VITAMIN B-1) tablet 100 mg  100 mg Oral BID Acquanetta SitElizabeth Woods Oates, MD   100 mg at 06/07/16 [phone]  . traZODone (DESYREL) tablet 50 mg  50 mg Oral QHS PRN Jackelyn PolingJason A Berry, NP   50 mg at 06/06/16 2110   PTA Medications: Prescriptions Prior to Admission  Medication Sig Dispense Refill Last Dose  . chlordiazePOXIDE (LIBRIUM) 5 MG capsule TK ONE C PO Q 6 H PRA  0   . Multiple Vitamin (MULTIVITAMIN WITH MINERALS) TABS tablet Take 1 tablet by mouth daily.     . nicotine (NICODERM CQ - DOSED IN MG/24 HOURS) 21 mg/24hr patch Place 1 patch (21 mg total) onto the skin daily. For smoking cessation 28 patch 0   . PARoxetine (PAXIL) 20 MG tablet Take 1 tablet (20 mg total) by mouth daily. For depression 30 tablet 0   . QUEtiapine (SEROQUEL) 200 MG tablet Take by mouth.     . traZODone (DESYREL) 50 MG  tablet Take by mouth.     . zolpidem (AMBIEN) 10 MG tablet Take 10 mg by mouth at bedtime as needed for sleep.     . clotrimazole (GYNE-LOTRIMIN) 1 % vaginal cream Place 1 Applicatorful vaginally at bedtime. 45 g 0   . gabapentin (NEURONTIN) 300 MG capsule Take 1 capsule (300 mg total) by mouth 3 (three) times daily. For agitation 90 capsule 0   . hydrOXYzine (ATARAX/VISTARIL) 25 MG tablet Take 1 tablet (25 mg) four times daily as needed: For anxiety 60 tablet 0   . ibuprofen (ADVIL,MOTRIN) 200 MG tablet Take 1 tablet (200 mg total) by mouth every 6 (six) hours as needed for moderate pain. 30 tablet 0   . levothyroxine (SYNTHROID, LEVOTHROID) 75 MCG tablet Take 1 tablet (75 mcg total) by mouth daily before breakfast. For thyroid hormone replacement     . traZODone (DESYREL) 50 MG tablet Take 1 tablet (50 mg) at bedtime: For mood control 30 tablet 0     Treatment  Modalities: Medication Management, Group therapy, Case management,  1 to 1 session with clinician, Psychoeducation, Recreational therapy.   Physician Treatment Plan for Primary Diagnosis: Major depressive disorder, recurrent episode with melancholic features ([place]) Long Term Goal(s): Improvement in symptoms so as ready for discharge   Short Term Goals: Ability to identify changes in lifestyle to reduce recurrence of condition will improve, Ability to identify and develop effective coping behaviors will improve, Compliance with prescribed medications will improve and Ability to identify triggers associated with substance abuse/mental health issues will improve  Medication Management: Evaluate patient's response, side effects, and tolerance of medication regimen.  Therapeutic Interventions: 1 to 1 sessions, Unit Group sessions and Medication administration.  Evaluation of Outcomes: Adequate for Discharge  Physician Treatment Plan for Secondary Diagnosis: Principal Problem:   Major depressive disorder, recurrent episode with melancholic features ([place]) Active Problems:   Alcohol use disorder, moderate, dependence ([place])   Hypothyroidism  Long Term Goal(s): Improvement in symptoms so as ready for discharge  Short Term Goals: Ability to identify changes in lifestyle to reduce recurrence of condition will improve, Ability to identify and develop effective coping behaviors will improve, Ability to maintain clinical measurements within normal limits will improve and Ability to identify triggers associated with substance abuse/mental health issues will improve  Medication Management: Evaluate patient's response, side effects, and tolerance of medication regimen.  Therapeutic Interventions: 1 to 1 sessions, Unit Group sessions and Medication administration.  Evaluation of Outcomes: Adequate for Discharge   RN Treatment Plan for Primary Diagnosis: Major depressive disorder, recurrent episode with  melancholic features ([place]) Long Term Goal(s): Knowledge of disease and therapeutic regimen to maintain health will improve  Short Term Goals: Ability to remain free from injury will improve, Ability to disclose and discuss suicidal ideas, Ability to identify and develop effective coping behaviors will improve and Compliance with prescribed medications will improve  Medication Management: RN will administer medications as ordered by provider, will assess and evaluate patient's response and provide education to patient for prescribed medication. RN will report any adverse and/or side effects to prescribing provider.  Therapeutic Interventions: 1 on 1 counseling sessions, Psychoeducation, Medication administration, Evaluate responses to treatment, Monitor vital signs and CBGs as ordered, Perform/monitor CIWA, COWS, AIMS and Fall Risk screenings as ordered, Perform wound care treatments as ordered.  Evaluation of Outcomes: Adequate for Discharge   LCSW Treatment Plan for Primary Diagnosis: Major depressive disorder, recurrent episode with melancholic features ([place]) Long Term Goal(s): Safe transition to appropriate  next level of care at discharge, Engage patient in therapeutic group addressing interpersonal concerns.  Short Term Goals: Engage patient in aftercare planning with referrals and resources, Increase emotional regulation, Identify triggers associated with mental health/substance abuse issues and Increase skills for wellness and recovery  Therapeutic Interventions: Assess for all discharge needs, 1 to 1 time with Social worker, Explore available resources and support systems, Assess for adequacy in community support network, Educate family and significant other(s) on suicide prevention, Complete Psychosocial Assessment, Interpersonal group therapy.  Evaluation of Outcomes: Adequate for Discharge   Progress in Treatment :  Attending groups: No  Has been on precautions due to  lice  Participating in groups: No  Taking medication as prescribed: Yes,   Toleration medication: Yes  Family/Significant other contact made: Yes  Patient understands diagnosis: Yes  Discussing patient identified problems/goals with staff: Yes  Medical problems stabilized or resolved: Yes  Denies suicidal/homicidal ideation:Yes  Issues/concerns per patient self-inventory: None reported  Other: N/A  New problem(s) identified: None reported at this time    New Short Term/Long Term Goal(s): None at this time    Discharge Plan or Barriers: States she will go to grandmother's home, follow up outpt    Reason for Continuation of Hospitalization: Anxiety Medication stabilization   Estimated Length of Stay: D/C today, tomorrow at the latest    Attendees:  Patient:  Physician: Dr. Sharolyn Douglas, MD 06/07/2016  9:30am  Nursing: Otilio Carpen, RN 06/07/2016  9:30am  RN Care Manager: Lars Pinks, [place] 06/07/2016  9:30am  Social Workers: Peri Maris, LCSW, [place], LCSW, 06/07/2016  9:30am  Nurse Pratictioners:   Scribe for Treatment Team: Tilden Fossa, [place] Worker [facility] [phone]

## 2016-06-07 NOTE — Progress Notes (Signed)
Patient assessed by PA student and cleared by MD to come off of contact precautions.  Per attending patient received effective lice treatment yesterday at 1030, and no nits or lice observed today.

## 2016-06-07 NOTE — Progress Notes (Signed)
   Per Note: Pt was treated for lice again on [id number] at apprx 1230- to 1300. Informed pt that she would more than likely be on contact precautions for 24 hours.

## 2016-06-07 NOTE — Progress Notes (Deleted)
   D: Pt was anxious and somewhat tearful during the assessment. Informed the writer that things were "good" until she had to come back into her room. Writer explained the reason for the need of the contact precaution. Also explained that if the treatment was in the am, the order for her precaution should also end in the a.m. Stated her day was "good because she's not having any detox or anything". Writer was encouraged to follow rules for precaution.    A:  Support and encouragement was offered. 15 min checks continued for safety.  R: Pt remains safe.

## 2016-07-29 DIAGNOSIS — F419 Anxiety disorder, unspecified: Secondary | ICD-10-CM | POA: Diagnosis not present

## 2016-07-29 DIAGNOSIS — F10239 Alcohol dependence with withdrawal, unspecified: Secondary | ICD-10-CM | POA: Diagnosis not present

## 2016-09-03 ENCOUNTER — Inpatient Hospital Stay (HOSPITAL_COMMUNITY)
Admission: AD | Admit: 2016-09-03 | Discharge: 2016-09-06 | DRG: 369 | Disposition: A | Discharge status: Home / Self Care | Payer: Self-pay | Source: Other Acute Inpatient Hospital | Attending: Nephrology | Admitting: Nephrology

## 2016-09-03 DIAGNOSIS — R569 Unspecified convulsions: Secondary | ICD-10-CM | POA: Diagnosis present

## 2016-09-03 DIAGNOSIS — F329 Major depressive disorder, single episode, unspecified: Secondary | ICD-10-CM | POA: Diagnosis present

## 2016-09-03 DIAGNOSIS — R000 Tachycardia, unspecified: Secondary | ICD-10-CM

## 2016-09-03 DIAGNOSIS — F1010 Alcohol abuse, uncomplicated: Secondary | ICD-10-CM

## 2016-09-03 DIAGNOSIS — F10231 Alcohol dependence with withdrawal delirium: Secondary | ICD-10-CM | POA: Diagnosis present

## 2016-09-03 DIAGNOSIS — D6959 Other secondary thrombocytopenia: Secondary | ICD-10-CM | POA: Diagnosis present

## 2016-09-03 DIAGNOSIS — E871 Hypo-osmolality and hyponatremia: Secondary | ICD-10-CM | POA: Diagnosis present

## 2016-09-03 DIAGNOSIS — F10939 Alcohol use, unspecified with withdrawal, unspecified: Secondary | ICD-10-CM | POA: Diagnosis present

## 2016-09-03 DIAGNOSIS — K226 Gastro-esophageal laceration-hemorrhage syndrome: Principal | ICD-10-CM | POA: Diagnosis present

## 2016-09-03 DIAGNOSIS — Z8619 Personal history of other infectious and parasitic diseases: Secondary | ICD-10-CM

## 2016-09-03 DIAGNOSIS — K922 Gastrointestinal hemorrhage, unspecified: Secondary | ICD-10-CM

## 2016-09-03 DIAGNOSIS — B1920 Unspecified viral hepatitis C without hepatic coma: Secondary | ICD-10-CM | POA: Diagnosis present

## 2016-09-03 DIAGNOSIS — E039 Hypothyroidism, unspecified: Secondary | ICD-10-CM | POA: Diagnosis present

## 2016-09-03 DIAGNOSIS — F10239 Alcohol dependence with withdrawal, unspecified: Secondary | ICD-10-CM | POA: Diagnosis present

## 2016-09-03 DIAGNOSIS — D62 Acute posthemorrhagic anemia: Secondary | ICD-10-CM | POA: Diagnosis present

## 2016-09-03 DIAGNOSIS — D696 Thrombocytopenia, unspecified: Secondary | ICD-10-CM

## 2016-09-03 DIAGNOSIS — Z85038 Personal history of other malignant neoplasm of large intestine: Secondary | ICD-10-CM

## 2016-09-03 LAB — CBC
HEMATOCRIT: 32.5 % — AB (ref 36.0–46.0)
HEMOGLOBIN: 10.3 g/dL — AB (ref 12.0–15.0)
MCH: 27 pg (ref 26.0–34.0)
MCHC: 31.7 g/dL (ref 30.0–36.0)
MCV: 85.3 fL (ref 78.0–100.0)
Platelets: 51 10*3/uL — ABNORMAL LOW (ref 150–400)
RBC: 3.81 MIL/uL — ABNORMAL LOW (ref 3.87–5.11)
RDW: 15.3 % (ref 11.5–15.5)
WBC: 3.4 10*3/uL — ABNORMAL LOW (ref 4.0–10.5)

## 2016-09-03 LAB — CREATININE, SERUM
Creatinine, Ser: 0.59 mg/dL (ref 0.44–1.00)
GFR calc Af Amer: >60 mL/min (ref >60)

## 2016-09-03 LAB — COMPREHENSIVE METABOLIC PANEL
ALBUMIN: 3.8 g/dL (ref 3.5–5.0)
ALK PHOS: 43 U/L (ref 38–126)
ALT: 24 U/L (ref 14–54)
AST: 39 U/L (ref 15–41)
Anion gap: 4 — ABNORMAL LOW (ref 5–15)
BUN: 11 mg/dL (ref 6–20)
CHLORIDE: 95 mmol/L — AB (ref 101–111)
CO2: 34 mmol/L — ABNORMAL HIGH (ref 22–32)
Calcium: 8.8 mg/dL — ABNORMAL LOW (ref 8.9–10.3)
Creatinine, Ser: 0.57 mg/dL (ref 0.44–1.00)
GFR calc non Af Amer: >60 mL/min (ref >60)
Glucose, Bld: 89 mg/dL (ref 65–99)
Potassium: 4.2 mmol/L (ref 3.5–5.1)
SODIUM: 133 mmol/L — AB (ref 135–145)
TOTAL PROTEIN: 7.2 g/dL (ref 6.5–8.1)
Total Bilirubin: 0.5 mg/dL (ref 0.3–1.2)

## 2016-09-03 MED ORDER — SODIUM CHLORIDE 0.9% FLUSH: 3.0000 mL | INTRAVENOUS | Status: DC | PRN

## 2016-09-03 MED ORDER — FOLIC ACID 1 MG PO TABS
1.0000 mg | ORAL_TABLET | Freq: Every day | ORAL | Status: DC
  Administered 2016-09-03 – 2016-09-06 (×4): 1 mg via ORAL
  Filled 2016-09-03 (×4): qty 1

## 2016-09-03 MED ORDER — PANTOPRAZOLE SODIUM 40 MG IV SOLR
40.0000 mg | Freq: Two times a day (BID) | INTRAVENOUS | Status: DC
  Administered 2016-09-03 – 2016-09-04 (×2): 40 mg via INTRAVENOUS
  Filled 2016-09-03 (×2): qty 40

## 2016-09-03 MED ORDER — ADULT MULTIVITAMIN W/MINERALS CH
1.0000 | ORAL_TABLET | Freq: Every day | ORAL | Status: DC
  Administered 2016-09-03 – 2016-09-06 (×4): 1 via ORAL
  Filled 2016-09-03 (×4): qty 1

## 2016-09-03 MED ORDER — ONDANSETRON HCL 4 MG/2ML IJ SOLN: 4.0000 mg | Freq: Four times a day (QID) | INTRAMUSCULAR | Status: DC | PRN

## 2016-09-03 MED ORDER — ACETAMINOPHEN 325 MG PO TABS
650.0000 mg | ORAL_TABLET | ORAL | Status: DC | PRN
  Administered 2016-09-04 – 2016-09-06 (×6): 650 mg via ORAL
  Filled 2016-09-03 (×6): qty 2

## 2016-09-03 MED ORDER — SODIUM CHLORIDE 0.9% FLUSH
3.0000 mL | Freq: Two times a day (BID) | INTRAVENOUS | Status: DC
  Administered 2016-09-03 – 2016-09-06 (×5): 3 mL via INTRAVENOUS

## 2016-09-03 MED ORDER — DEXMEDETOMIDINE HCL IN NACL 200 MCG/50ML IV SOLN
0.2000 ug/kg/h | INTRAVENOUS | Status: DC
  Administered 2016-09-03: 0.4 ug/kg/h via INTRAVENOUS
  Administered 2016-09-04: 0.2 ug/kg/h via INTRAVENOUS
  Filled 2016-09-03 (×3): qty 50

## 2016-09-03 MED ORDER — SODIUM CHLORIDE 0.9 % IV SOLN: 250.0000 mL | INTRAVENOUS | Status: DC | PRN

## 2016-09-03 MED ORDER — ENOXAPARIN SODIUM 40 MG/0.4ML ~~LOC~~ SOLN: 40.0000 mg | SUBCUTANEOUS | Status: DC

## 2016-09-03 MED ORDER — VITAMIN B-1 100 MG PO TABS
100.0000 mg | ORAL_TABLET | Freq: Every day | ORAL | Status: DC
  Administered 2016-09-03 – 2016-09-06 (×4): 100 mg via ORAL
  Filled 2016-09-03 (×4): qty 1

## 2016-09-03 NOTE — H&P (Signed)
PULMONARY / CRITICAL CARE MEDICINE   Name: Dawn Foley MRN: [id number] DOB: Oct 27, 1985    ADMISSION DATE:  09/03/2016 CONSULTATION DATE:  09/03/16  REFERRING MD:  Oval Linsey - EDP  CHIEF COMPLAINT:  ETOH Withdrawal  HISTORY OF PRESENT ILLNESS:  Dawn Foley is a 30 y.o. female with PMH as outlined below including long time ETOH abuse (admitted to [facility] in Sept 2017 as IVC after SI). She presented to [facility] ED 12/25 with ETOH withdrawal and coffee ground emesis with progression to hematemesis.  She had apparently not felt well for a few days prior and had decreased appetite with reported last drink 2 days prior.  Earlier on day of presentation, pt vomited coffee ground material then progressed to bright blood with clots.  She was taken to [facility] where she was hemodynamically stable and had stable hgb.  She was later transferred to [facility] for further evaluation and management.  She is currently on precedex infusion and tolerating well.  No complaints and just wants to sleep.  PAST MEDICAL HISTORY :  She  has a past medical history of Alcoholism ([place]); Depression; and Hepatitis.  PAST SURGICAL HISTORY: She  has a past surgical history that includes No past surgeries.  No Known Allergies  No current facility-administered medications on file prior to encounter.    Current Outpatient Prescriptions on File Prior to Encounter  Medication Sig  . gabapentin (NEURONTIN) 300 MG capsule Take 1 capsule (300 mg total) by mouth 4 (four) times daily. For agitation  . hydrOXYzine (ATARAX/VISTARIL) 50 MG tablet Take 1 tablet (50 mg total) by mouth 3 (three) times daily as needed for anxiety.  Marland Kitchen levothyroxine (SYNTHROID, LEVOTHROID) 75 MCG tablet Take 1 tablet (75 mcg total) by mouth daily before breakfast. For thyroid hormone replacement  . lip balm (CARMEX) ointment Apply topically as needed for lip care.  . Multiple Vitamin (MULTIVITAMIN WITH MINERALS) TABS tablet Take 1 tablet by mouth daily.  For low Vitamin  . nicotine (NICODERM CQ - DOSED IN MG/24 HOURS) 21 mg/24hr patch Place 1 patch (21 mg total) onto the skin daily. For smoking cessation  . QUEtiapine (SEROQUEL) 100 MG tablet Take 1 tablet (100 mg total) by mouth 3 (three) times daily. For mood control  . traZODone (DESYREL) 50 MG tablet Take 1 tablet (50 mg total) by mouth at bedtime as needed for sleep.    FAMILY HISTORY:  Her indicated that the status of her other is unknown.    SOCIAL HISTORY: She  reports that she has never smoked. She has never used smokeless tobacco. She reports that she drinks alcohol. She reports that she does not use drugs.  REVIEW OF SYSTEMS:   All negative; except for those that are bolded, which indicate positives.  Constitutional: weight loss, weight gain, night sweats, fevers, chills, fatigue, weakness.  HEENT: headaches, sore throat, sneezing, nasal congestion, post nasal drip, difficulty swallowing, tooth/dental problems, visual complaints, visual changes, ear aches. Neuro: difficulty with speech, weakness, numbness, ataxia. CV:  chest pain, orthopnea, PND, swelling in lower extremities, dizziness, palpitations, syncope.  Resp: cough, hemoptysis, dyspnea, wheezing. GI: heartburn, indigestion, abdominal pain, nausea, vomiting, diarrhea, constipation, change in bowel habits, loss of appetite, hematemesis, melena, hematochezia.  GU: dysuria, change in color of urine, urgency or frequency, flank pain, hematuria. MSK: joint pain or swelling, decreased range of motion. Psych: change in mood or affect, depression, anxiety, suicidal ideations, homicidal ideations. Skin: rash, itching, bruising.  SUBJECTIVE:  No complaints, wants to sleep.  VITAL  SIGNS: BP 99/60   Pulse (!) 102   Resp 16   Ht 5\' 4"  (1.626 m)   Wt 154 lb 15.7 oz (70.3 kg)   SpO2 95%   BMI 26.60 kg/m   HEMODYNAMICS:    VENTILATOR SETTINGS:    INTAKE / OUTPUT: No intake/output data recorded.   PHYSICAL  EXAMINATION: General: Young female, in NAD. Neuro: Sleepy but easily arouseable.  No focal deficits. HEENT: [place]/AT. PERRL, sclerae anicteric. Cardiovascular: RRR, no M/R/G.  Lungs: Respirations even and unlabored.  CTA bilaterally, No W/R/R. Abdomen: BS x 4, soft, NT/ND.  Musculoskeletal: No gross deformities, no edema.  Skin: Intact, warm, no rashes.     LABS:  BMET  Recent Labs Lab 09/03/16 2200  NA 133*  K 4.2  CL 95*  CO2 34*  BUN 11  CREATININE 0.57  0.59  GLUCOSE 89    Electrolytes  Recent Labs Lab 09/03/16 2200  CALCIUM 8.8*    CBC  Recent Labs Lab 09/03/16 2200  WBC 3.4*  HGB 10.3*  HCT 32.5*  PLT 51*    Coag's No results for input(s): APTT, INR in the last 168 hours.  Sepsis Markers No results for input(s): LATICACIDVEN, PROCALCITON, O2SATVEN in the last 168 hours.  ABG No results for input(s): PHART, PCO2ART, PO2ART in the last 168 hours.  Liver Enzymes  Recent Labs Lab 09/03/16 2200  AST 39  ALT 24  ALKPHOS 43  BILITOT 0.5  ALBUMIN 3.8    Cardiac Enzymes No results for input(s): TROPONINI, PROBNP in the last 168 hours.  Glucose No results for input(s): GLUCAP in the last 168 hours.  Imaging No results found.   STUDIES:  None.  CULTURES: None.  ANTIBIOTICS: None.  SIGNIFICANT EVENTS: 12/25 > admit.  LINES/TUBES: None.  DISCUSSION: 30 y.o. female with hx ETOH abuse, admitted 12/25 as transfer from [place] with ETOH withdrawal and UGI bleed.  Started on precedex and seen by GI.  ASSESSMENT / PLAN:  NEUROLOGIC A:   ETOH withdrawal. Hx ETOH abuse, depression, SI. P:   Sedation:  precedex gtt. RASS goal: 0 to -1. Thiamine / Folate / MVM. Assess UDS ETOH cessation counseling once extubated. Continue preadmission quetiapine. Hold preadmission gabapentin, hydroxyzine, trazodone.  GASTROINTESTINAL A:   UGIB. Hx HCV. P:   Continue PPI. Seen by GI, possible EGD in AM. No interventions  required.  PULMONARY A: No acute issues. P:   No interventions required.  CARDIOVASCULAR A:  Sinus tach - due to withdrawal. P:  Monitor hemodynamics.  RENAL A:   Mild hyponatremia - likely due to ETOH. P:   NS @ 75. BMP in AM.  HEMATOLOGIC A:   Mild anemia. Thrombocytopenia - due to bone marrow suppression from ETOH. VTE Prophylaxis. P:  Transfuse for Hgb < 7. Monitor platelet counts. SCD's. CBC in AM.  INFECTIOUS A:   No indication of infection. P:   Monitor clinically.  ENDOCRINE A:   Hx hypothyroidism. P:   Continue preadmission synthroid. Assess TSH.  Family updated: None available.  Interdisciplinary Family Meeting v Palliative Care Meeting:  Due by: 09/09/16.  CC time: 30 minutes.   Montey Hora, [place] - [facility] [place] [facility] Pager: [phone]  or [phone] 09/03/2016, 11:23 PM

## 2016-09-03 NOTE — Consult Note (Signed)
Reason for Consult: Hematemesis Referring Physician: [facility]  Dawn Foley HPI: This is a 30 year old female with alcoholism, DTs, and ETOH withdrawal seizures admitted for complaints of coffee-ground emesis with progression to hematemesis.  Over the past couple of days she reports that she was not feeling very well.  Her appetite was low and her last reported drink of ETOH was two days ago.  Earlier today she vomited coffee-ground material and then the vomiting continued with resultant hematemesis with clots.  No records are available for my review, but in speaking with the [facility] ER physician she was reported to have a stable HGB as well as vital signs.  No reports of PUD in the past and she denies any issues with GERD or esophagitis.  Her vomiting did not precede her coffee-ground emesis.  The patient reports constipation and there is no report of hematochezia or melena.  Past Medical History:  Diagnosis Date  . Alcoholism ([place])   . Depression   . Hepatitis     Past Surgical History:  Procedure Laterality Date  . NO PAST SURGERIES      Family History  Problem Relation Age of Onset  . Mental illness Other     Social History:  reports that she has never smoked. She has never used smokeless tobacco. She reports that she drinks alcohol. She reports that she does not use drugs.  Allergies: No Known Allergies  Medications:  Scheduled: . folic acid  1 mg Oral Daily  . multivitamin with minerals  1 tablet Oral Daily  . pantoprazole (PROTONIX) IV  40 mg Intravenous Q12H  . sodium chloride flush  3 mL Intravenous Q12H  . thiamine  100 mg Oral Daily   Continuous: . dexmedetomidine      No results found for this or any previous visit (from the past 24 hour(s)).   No results found.  ROS:  As stated above in the HPI otherwise negative.  Blood pressure 127/89, pulse (!) 124, resp. rate 18, height 5\' 4"  (1.626 m), weight 70.3 kg (154 lb 15.7 oz), SpO2 98 %.     PE: Gen: NAD, Alert and Oriented, tremulous, pressured speech HEENT:  [place]/AT, EOMI Neck: Supple, no LAD Lungs: CTA Bilaterally CV: RRR without M/G/R ABM: Soft, minimal abdominal tenderness, +BS Ext: No C/C/E  Assessment/Plan: 1) Hematemesis. 2) ETOH abuse. 3) History of DTs and withdrawal seizures.   I was able to locate her blood work results and her HGB at [place] was at 12.  She does report using ibuprofen 800 mg yesterday, but she denies chronic use.  She is hemodynamically stable, but her tachycardia is secondary to her withdrawal.  Plan: 1) Tentative EGD.  This will depend on her withdrawal status.  I will discuss the case with Dr. Loletha Carrow in the AM and he will decide if he wants to pursue the EGD tomorrow. 2) PPI. 3) Treat for ETOH withdrawal.  Dameir Gentzler D 09/03/2016, 9:43 PM

## 2016-09-03 NOTE — Progress Notes (Signed)
[facility] Progress Note Patient Name: TILLA BRIDSON DOB: [birth year] MRN: [id number]   Date of Service  09/03/2016  HPI/Events of Note  30 yo ETOH withdrawal, presents with UGI bleed and delerium tremens  eICU Interventions  ICU withdrawal protocol, PPI BID Watch h/h     Intervention Category Evaluation Type: New Patient Evaluation  Flora Lipps 09/03/2016, 9:17 PM

## 2016-09-04 ENCOUNTER — Inpatient Hospital Stay (HOSPITAL_COMMUNITY): Payer: Self-pay | Admitting: Anesthesiology

## 2016-09-04 ENCOUNTER — Encounter (HOSPITAL_COMMUNITY): Payer: Self-pay | Admitting: *Deleted

## 2016-09-04 ENCOUNTER — Encounter (HOSPITAL_COMMUNITY)
Admission: AD | Disposition: A | Discharge status: Home / Self Care | Payer: Self-pay | Source: Other Acute Inpatient Hospital | Attending: Pulmonary Disease

## 2016-09-04 DIAGNOSIS — Z8619 Personal history of other infectious and parasitic diseases: Secondary | ICD-10-CM

## 2016-09-04 DIAGNOSIS — K226 Gastro-esophageal laceration-hemorrhage syndrome: Principal | ICD-10-CM

## 2016-09-04 DIAGNOSIS — D696 Thrombocytopenia, unspecified: Secondary | ICD-10-CM

## 2016-09-04 DIAGNOSIS — R000 Tachycardia, unspecified: Secondary | ICD-10-CM

## 2016-09-04 DIAGNOSIS — K922 Gastrointestinal hemorrhage, unspecified: Secondary | ICD-10-CM

## 2016-09-04 DIAGNOSIS — F1010 Alcohol abuse, uncomplicated: Secondary | ICD-10-CM

## 2016-09-04 HISTORY — PX: ESOPHAGOGASTRODUODENOSCOPY (EGD) WITH PROPOFOL: SHX5813

## 2016-09-04 LAB — BASIC METABOLIC PANEL
Anion gap: 8 (ref 5–15)
BUN: 10 mg/dL (ref 6–20)
CALCIUM: 9 mg/dL (ref 8.9–10.3)
CO2: 30 mmol/L (ref 22–32)
CREATININE: 0.61 mg/dL (ref 0.44–1.00)
Chloride: 97 mmol/L — ABNORMAL LOW (ref 101–111)
GFR calc non Af Amer: >60 mL/min (ref >60)
Glucose, Bld: 78 mg/dL (ref 65–99)
Potassium: 3.7 mmol/L (ref 3.5–5.1)
SODIUM: 135 mmol/L (ref 135–145)

## 2016-09-04 LAB — BLOOD GAS, ARTERIAL
Acid-Base Excess: 5.8 mmol/L — ABNORMAL HIGH (ref 0.0–2.0)
BICARBONATE: 29.9 mmol/L — AB (ref 20.0–28.0)
Drawn by: 41875
FIO2: 21
O2 SAT: 94.4 %
PATIENT TEMPERATURE: 98.6
PCO2 ART: 43.8 mmHg (ref 32.0–48.0)
PO2 ART: 75.8 mmHg — AB (ref 83.0–108.0)
pH, Arterial: 7.448 (ref 7.350–7.450)

## 2016-09-04 LAB — CBC
HCT: 31.9 % — ABNORMAL LOW (ref 36.0–46.0)
Hemoglobin: 10.1 g/dL — ABNORMAL LOW (ref 12.0–15.0)
MCH: 27.2 pg (ref 26.0–34.0)
MCHC: 31.7 g/dL (ref 30.0–36.0)
MCV: 85.8 fL (ref 78.0–100.0)
Platelets: 48 10*3/uL — ABNORMAL LOW (ref 150–400)
RBC: 3.72 MIL/uL — ABNORMAL LOW (ref 3.87–5.11)
RDW: 15.4 % (ref 11.5–15.5)
WBC: 2.5 10*3/uL — ABNORMAL LOW (ref 4.0–10.5)

## 2016-09-04 LAB — RAPID URINE DRUG SCREEN, HOSP PERFORMED
Amphetamines: NOT DETECTED
BENZODIAZEPINES: POSITIVE — AB
Barbiturates: NOT DETECTED
Cocaine: NOT DETECTED
Opiates: POSITIVE — AB
Tetrahydrocannabinol: NOT DETECTED

## 2016-09-04 LAB — MRSA PCR SCREENING: MRSA BY PCR: NEGATIVE

## 2016-09-04 LAB — PROTIME-INR
INR: 1.09
PROTHROMBIN TIME: 14.2 s (ref 11.4–15.2)

## 2016-09-04 LAB — TSH: TSH: 9.16 u[IU]/mL — ABNORMAL HIGH (ref 0.350–4.500)

## 2016-09-04 LAB — ETHANOL

## 2016-09-04 SURGERY — ESOPHAGOGASTRODUODENOSCOPY (EGD) WITH PROPOFOL
Anesthesia: Monitor Anesthesia Care

## 2016-09-04 MED ORDER — BLISTEX MEDICATED EX OINT
TOPICAL_OINTMENT | CUTANEOUS | Status: DC | PRN
  Filled 2016-09-04: qty 6.3

## 2016-09-04 MED ORDER — LORAZEPAM 2 MG/ML IJ SOLN
1.0000 mg | INTRAMUSCULAR | Status: DC | PRN
  Administered 2016-09-04 – 2016-09-05 (×9): 2 mg via INTRAVENOUS
  Filled 2016-09-04 (×10): qty 1

## 2016-09-04 MED ORDER — PROPOFOL 10 MG/ML IV BOLUS
INTRAVENOUS | Status: DC | PRN
  Administered 2016-09-04: 50 mg via INTRAVENOUS

## 2016-09-04 MED ORDER — LEVOTHYROXINE SODIUM 75 MCG PO TABS
75.0000 ug | ORAL_TABLET | Freq: Every day | ORAL | Status: DC
  Administered 2016-09-04 – 2016-09-06 (×3): 75 ug via ORAL
  Filled 2016-09-04 (×3): qty 1

## 2016-09-04 MED ORDER — SODIUM CHLORIDE 0.9 % IV SOLN
INTRAVENOUS | Status: DC | PRN
Start: 2016-09-04 — End: 2016-09-04
  Administered 2016-09-04: 11:00:00 via INTRAVENOUS

## 2016-09-04 MED ORDER — EPINEPHRINE PF 1 MG/10ML IJ SOSY
PREFILLED_SYRINGE | INTRAMUSCULAR | Status: AC
  Filled 2016-09-04: qty 10

## 2016-09-04 MED ORDER — SODIUM CHLORIDE 0.9 % IV SOLN
INTRAVENOUS | Status: DC | PRN
  Administered 2016-09-04 (×2): 80 ug via INTRAVENOUS

## 2016-09-04 MED ORDER — PANTOPRAZOLE SODIUM 40 MG PO TBEC
40.0000 mg | DELAYED_RELEASE_TABLET | Freq: Two times a day (BID) | ORAL | Status: DC
  Administered 2016-09-04 – 2016-09-06 (×4): 40 mg via ORAL
  Filled 2016-09-04 (×4): qty 1

## 2016-09-04 MED ORDER — SODIUM CHLORIDE 0.9 % IV SOLN: INTRAVENOUS | Status: DC

## 2016-09-04 MED ORDER — PROPOFOL 500 MG/50ML IV EMUL
INTRAVENOUS | Status: DC | PRN
Start: 2016-09-04 — End: 2016-09-04
  Administered 2016-09-04: 100 ug/kg/min via INTRAVENOUS

## 2016-09-04 MED ORDER — METOCLOPRAMIDE HCL 5 MG/ML IJ SOLN
10.0000 mg | Freq: Once | INTRAMUSCULAR | Status: AC | PRN
  Administered 2016-09-04: 10 mg via INTRAVENOUS
  Filled 2016-09-04: qty 2

## 2016-09-04 MED ORDER — MIDAZOLAM HCL 5 MG/5ML IJ SOLN
INTRAMUSCULAR | Status: DC | PRN
  Administered 2016-09-04: 2 mg via INTRAVENOUS

## 2016-09-04 MED ORDER — QUETIAPINE FUMARATE 100 MG PO TABS
100.0000 mg | ORAL_TABLET | Freq: Three times a day (TID) | ORAL | Status: DC
  Administered 2016-09-04 – 2016-09-06 (×7): 100 mg via ORAL
  Filled 2016-09-04 (×7): qty 1

## 2016-09-04 MED ORDER — SODIUM CHLORIDE 0.9 % IV SOLN
INTRAVENOUS | Status: DC
  Administered 2016-09-04 (×2): via INTRAVENOUS

## 2016-09-04 MED ORDER — FENTANYL CITRATE (PF) 100 MCG/2ML IJ SOLN
25.0000 ug | INTRAMUSCULAR | Status: DC | PRN
Start: 2016-09-04 — End: 2016-09-06

## 2016-09-04 NOTE — H&P (View-Only) (Signed)
Reason for Consult: Hematemesis Referring Physician: [facility]  Dawn Foley HPI: This is a 30 year old female with alcoholism, DTs, and ETOH withdrawal seizures admitted for complaints of coffee-ground emesis with progression to hematemesis.  Over the past couple of days she reports that she was not feeling very well.  Her appetite was low and her last reported drink of ETOH was two days ago.  Earlier today she vomited coffee-ground material and then the vomiting continued with resultant hematemesis with clots.  No records are available for my review, but in speaking with the [facility] ER physician she was reported to have a stable HGB as well as vital signs.  No reports of PUD in the past and she denies any issues with GERD or esophagitis.  Her vomiting did not precede her coffee-ground emesis.  The patient reports constipation and there is no report of hematochezia or melena.  Past Medical History:  Diagnosis Date  . Alcoholism ([place])   . Depression   . Hepatitis     Past Surgical History:  Procedure Laterality Date  . NO PAST SURGERIES      Family History  Problem Relation Age of Onset  . Mental illness Other     Social History:  reports that she has never smoked. She has never used smokeless tobacco. She reports that she drinks alcohol. She reports that she does not use drugs.  Allergies: No Known Allergies  Medications:  Scheduled: . folic acid  1 mg Oral Daily  . multivitamin with minerals  1 tablet Oral Daily  . pantoprazole (PROTONIX) IV  40 mg Intravenous Q12H  . sodium chloride flush  3 mL Intravenous Q12H  . thiamine  100 mg Oral Daily   Continuous: . dexmedetomidine      No results found for this or any previous visit (from the past 24 hour(s)).   No results found.  ROS:  As stated above in the HPI otherwise negative.  Blood pressure 127/89, pulse (!) 124, resp. rate 18, height 5\' 4"  (1.626 m), weight 70.3 kg (154 lb 15.7 oz), SpO2 98 %.     PE: Gen: NAD, Alert and Oriented, tremulous, pressured speech HEENT:  [place]/AT, EOMI Neck: Supple, no LAD Lungs: CTA Bilaterally CV: RRR without M/G/R ABM: Soft, minimal abdominal tenderness, +BS Ext: No C/C/E  Assessment/Plan: 1) Hematemesis. 2) ETOH abuse. 3) History of DTs and withdrawal seizures.   I was able to locate her blood work results and her HGB at [place] was at 12.  She does report using ibuprofen 800 mg yesterday, but she denies chronic use.  She is hemodynamically stable, but her tachycardia is secondary to her withdrawal.  Plan: 1) Tentative EGD.  This will depend on her withdrawal status.  I will discuss the case with Dr. Loletha Carrow in the AM and he will decide if he wants to pursue the EGD tomorrow. 2) PPI. 3) Treat for ETOH withdrawal.  Kionte Baumgardner D 09/03/2016, 9:43 PM

## 2016-09-04 NOTE — Progress Notes (Signed)
Daily Rounding Note  09/04/2016, 8:28 AM  LOS: 1 day   SUBJECTIVE:   Chief complaint:  CG and clots of bloody emesis.   No recurrence of emesis at all since anti nausea meds at [facility].  She is hungry.  Very anxious re not wanting to leave [place] until she has had EGD.  Never had previous EGD  OBJECTIVE:         Vital signs in last 24 hours:    Temp:  [97.7 F (36.5 C)-98.6 F (37 C)] 97.7 F (36.5 C) (12/26 0700) Pulse Rate:  [66-124] 66 (12/26 0800) Resp:  [12-18] 12 (12/26 0800) BP: (83-127)/(54-89) 86/55 (12/26 0800) SpO2:  [95 %-98 %] 97 % (12/26 0800) Weight:  [70.3 kg (154 lb 15.7 oz)-71.3 kg (157 lb 3 oz)] 71.3 kg (157 lb 3 oz) (12/26 0500) Last BM Date: 08/30/16 (Normally goes everyday) Filed Weights   09/03/16 2104 09/04/16 0500  Weight: 70.3 kg (154 lb 15.7 oz) 71.3 kg (157 lb 3 oz)   General: anxious, tremulous.  Fully alert   Heart: RRR in 70s.   Chest: clear bil.   Abdomen: soft, NT, ND.  Active BS  Extremities: no CCE Neuro/Psych:  Alert.  Anxious.  Oriented x 3.    Intake/Output from previous day: 12/25 0701 - 12/26 0700 In: 394.3 [I.V.:394.3] Out: 325 [Urine:325]  Intake/Output this shift: Total I/O In: 160.6 [I.V.:160.6] Out: -   Lab Results:  Recent Labs  09/03/16 2200 09/04/16 0258  WBC 3.4* 2.5*  HGB 10.3* 10.1*  HCT 32.5* 31.9*  PLT 51* 48*   BMET  Recent Labs  09/03/16 2200 09/04/16 0258  NA 133* 135  K 4.2 3.7  CL 95* 97*  CO2 34* 30  GLUCOSE 89 78  BUN 11 10  CREATININE 0.57  0.59 0.61  CALCIUM 8.8* 9.0   LFT  Recent Labs  09/03/16 2200  PROT 7.2  ALBUMIN 3.8  AST 39  ALT 24  ALKPHOS 43  BILITOT 0.5   PT/INR  Recent Labs  09/04/16 0258  LABPROT 14.2  INR 1.09   Hepatitis Panel No results for input(s): HEPBSAG, HCVAB, HEPAIGM, HEPBIGM in the last 72 hours.  Studies/Results: No results found.   Scheduled Meds: . folic acid  1 mg  Oral Daily  . levothyroxine  75 mcg Oral QAC breakfast  . multivitamin with minerals  1 tablet Oral Daily  . pantoprazole (PROTONIX) IV  40 mg Intravenous Q12H  . QUEtiapine  100 mg Oral TID  . sodium chloride flush  3 mL Intravenous Q12H  . thiamine  100 mg Oral Daily   Continuous Infusions: . sodium chloride 75 mL/hr at 09/04/16 0800  . dexmedetomidine 0.302 mcg/kg/hr (09/04/16 0800)   PRN Meds:.sodium chloride, sodium chloride, acetaminophen, LORazepam, ondansetron (ZOFRAN) IV, sodium chloride flush   ASSESMENT:   *  CG emesis.  In alcoholic using limited Ibuprofen PTA.   No PPI PTA.  On Protonix IV BID.  Rule out ulcer, gastritis, esophagitis, MWT, portal htn, varices.   *  ETOH abuse, dependence.  Depression.  Anxiety.  Precedex for withdrawal.    *  ETOH hepatitis.  Improving labs.     *  Thrombocytopenia.  Normal Coags.    *  Blood loss anemia.  Hgb drop of 4.5 grams overall, but suspect dehydrated at arrival.  Hgb stable in last 6 hours. No GI imaging. Hx iron def anemia dating to at least  10/2013. Baseline in 02/2016 hgb 14.  *  Hx colon cancer and resection 2009.  2015 colonoscopy at [facility], Dr June Leap.  Poor prep.  "evidence of a prior functional end-to-end ileo-colonic anastomosis in the transverse colon. This was patent. This was characterized by healthy appearing mucosa".  She says her GI Dr is Dr Corena Pilgrim in [place] for EGD.      Dawn Foley  09/04/2016, 8:28 AM Pager: [phone]

## 2016-09-04 NOTE — Op Note (Signed)
[facility] Patient Name: Dawn Foley Procedure Date : 09/04/2016 MRN: [id number] Attending MD: Dawn Foley , MD Date of Birth: 09-16-1985 CSN: [id number] Age: 30 Admit Type: Inpatient Procedure:                Upper GI endoscopy Indications:              Hematemesis Providers:                Dawn Mussel L. Loletha Carrow, MD, Dawn Schwalbe RN, RN, Dawn Foley, Technician Referring MD:              Medicines:                Monitored Anesthesia Care Complications:            No immediate complications. Estimated Blood Loss:     Estimated blood loss: none. Procedure:                Pre-Anesthesia Assessment:                           - Prior to the procedure, a History and Physical                            was performed, and patient medications and                            allergies were reviewed. The patient's tolerance of                            previous anesthesia was also reviewed. The risks                            and benefits of the procedure and the sedation                            options and risks were discussed with the patient.                            All questions were answered, and informed consent                            was obtained. Prior Anticoagulants: The patient has                            taken no previous anticoagulant or antiplatelet                            agents. ASA Grade Assessment: III - A patient with                            severe systemic disease. After reviewing the risks  and benefits, the patient was deemed in                            satisfactory condition to undergo the procedure.                           After obtaining informed consent, the endoscope was                            passed under direct vision. Throughout the                            procedure, the patient's blood pressure, pulse, and                            oxygen saturations were  monitored continuously. The                            [id number]) scope was introduced through the                            mouth, and advanced to the second part of duodenum.                            The upper GI endoscopy was accomplished without                            difficulty. The patient tolerated the procedure                            well. Scope In: Scope Out: Findings:      A non-bleeding Mallory-Weiss tear with stigmata of recent bleeding was       found at the EGJ, extending into the gastric cardia (seen on       anteflexion, but seen better on retroflexion). It had an adherent       platelet plug.      The stomach was otherwise normal.      The examined duodenum was normal.      There was no fresh or old blood in the UGI tract.      Due to the patient's clinical stability, severe thrombocytopenia and       difficult location of the lesion, intervention such as a clip was not       undertaken. Impression:               - Mallory-Weiss tear.                           - Normal stomach.                           - Normal examined duodenum.                           - No specimens collected. Moderate Sedation:      MAC sedation used Recommendation:           -  Patient has a contact number available for                            emergencies. The signs and symptoms of potential                            delayed complications were discussed with the                            patient. Return to normal activities tomorrow.                            Written discharge instructions were provided to the                            patient.                           - Soft diet for 1 week.                           - Use Protonix (pantoprazole) 40 mg PO BID for 6                            weeks.                           - Discontinue alcohol use                           Discontinue NSAID use (i.e. ibuprofen)                           If patient has no overt  GI bleeding by tomorrow AM                            she can be discharged home.                           - Post procedure medication orders were given. Procedure Code(s):        --- Professional ---                           [phone], Esophagogastroduodenoscopy, flexible,                            transoral; diagnostic, including collection of                            specimen(s) by brushing or washing, when performed                            (separate procedure) Diagnosis Code(s):        --- Professional ---  K22.6, Gastro-esophageal laceration-hemorrhage                            syndrome                           K92.0, Hematemesis CPT copyright 2016 American Medical Association. All rights reserved. The codes documented in this report are preliminary and upon coder review may  be revised to meet current compliance requirements. Dawn Stierwalt L. Loletha Carrow, MD 09/04/2016 11:37:24 AM This report has been signed electronically. Number of Addenda: 0

## 2016-09-04 NOTE — Interval H&P Note (Signed)
History and Physical Interval Note:  09/04/2016 10:54 AM  Dawn Foley  has presented today for surgery, with the diagnosis of hematemesis.  The various methods of treatment have been discussed with the patient and family. After consideration of risks, benefits and other options for treatment, the patient has consented to  Procedure(s): ESOPHAGOGASTRODUODENOSCOPY (EGD) WITH PROPOFOL (N/A) as a surgical intervention .  The patient's history has been reviewed, patient examined, no change in status, stable for surgery.  I have reviewed the patient's chart and labs.  Questions were answered to the patient's satisfaction.     [name]

## 2016-09-04 NOTE — Progress Notes (Signed)
PULMONARY / CRITICAL CARE MEDICINE   Name: Dawn Foley MRN: [id number] DOB: May 28, 1986    ADMISSION DATE:  09/03/2016 CONSULTATION DATE:  09/03/16  REFERRING MD:  Oval Linsey - EDP  CHIEF COMPLAINT:  ETOH Withdrawal and UGI bleeding  HISTORY OF PRESENT ILLNESS:  Dawn Foley is a 30 y.o. female with PMH as outlined below including long time ETOH abuse (admitted to [facility] in Sept 2017 as IVC after SI). She presented to [facility] ED 12/25 with ETOH withdrawal and coffee ground emesis with progression to hematemesis.  She had apparently not felt well for a few days prior and had decreased appetite with reported last drink 2 days prior.  Earlier on day of presentation, pt vomited coffee ground material then progressed to bright blood with clots.  She was taken to [facility] where she was hemodynamically stable and had stable hgb.  She was later transferred to [facility] for further evaluation and management.  She is currently on precedex infusion and tolerating well.  No complaints and just wants to sleep.  SUBJECTIVE:  No events overnight, going to EGD.  VITAL SIGNS: BP (!) 88/56   Pulse 77   Temp 97.7 F (36.5 C)   Resp 16   Ht 5\' 4"  (1.626 m)   Wt 71.3 kg (157 lb 3 oz)   SpO2 96%   BMI 26.98 kg/m   HEMODYNAMICS:    VENTILATOR SETTINGS:    INTAKE / OUTPUT: I/O last 3 completed shifts: In: 474.6 [I.V.:474.6] Out: 325 [Urine:325]  PHYSICAL EXAMINATION: General: Young female, in NAD. Neuro: Easily arouseable.  No focal deficits, moving all ext to command. HEENT: [place]/AT. PERRL, sclerae anicteric. Cardiovascular: RRR, no M/R/G.  Lungs: Respirations even and unlabored.  CTA bilaterally, No W/R/R. Abdomen: BS x 4, soft, NT/ND.  Musculoskeletal: No gross deformities, no edema.  Skin: Intact, warm, no rashes.  LABS:  BMET  Recent Labs Lab 09/03/16 2200 09/04/16 0258  NA 133* 135  K 4.2 3.7  CL 95* 97*  CO2 34* 30  BUN 11 10  CREATININE 0.57  0.59 0.61  GLUCOSE 89 78    Electrolytes  Recent Labs Lab 09/03/16 2200 09/04/16 0258  CALCIUM 8.8* 9.0   CBC  Recent Labs Lab 09/03/16 2200 09/04/16 0258  WBC 3.4* 2.5*  HGB 10.3* 10.1*  HCT 32.5* 31.9*  PLT 51* 48*   Coag's  Recent Labs Lab 09/04/16 0258  INR 1.09   Sepsis Markers No results for input(s): LATICACIDVEN, PROCALCITON, O2SATVEN in the last 168 hours.  ABG  Recent Labs Lab 09/04/16 0329  PHART 7.448  PCO2ART 43.8  PO2ART 75.8*   Liver Enzymes  Recent Labs Lab 09/03/16 2200  AST 39  ALT 24  ALKPHOS 43  BILITOT 0.5  ALBUMIN 3.8   Cardiac Enzymes No results for input(s): TROPONINI, PROBNP in the last 168 hours.  Glucose No results for input(s): GLUCAP in the last 168 hours.  Imaging I reviewed EKG myself, sinus tach noted.  STUDIES:  None.  CULTURES: None.  ANTIBIOTICS: None.  SIGNIFICANT EVENTS: 12/25 > admit.  LINES/TUBES: PIV  DISCUSSION: 30 y.o. female with hx ETOH abuse, admitted 12/25 as transfer from [place] with ETOH withdrawal and UGI bleed.  Started on precedex and seen by GI.  ASSESSMENT / PLAN:  NEUROLOGIC A:   ETOH withdrawal. Hx ETOH abuse, depression, SI. P:   D/C precedex gtt, never really had appropriate dosing of ativan via CIWA. RASS goal: 0 to -1. Thiamine / Folate / MVM. ETOH cessation  counseling prior to discharge Continue preadmission quetiapine. Hold preadmission gabapentin, hydroxyzine, trazodone.  GASTROINTESTINAL A:   UGIB. Hx HCV. P:   Continue PPI. Seen by GI, possible EGD in AM. No interventions required.  PULMONARY A: No acute issues. P:   No interventions required.  CARDIOVASCULAR A:  Sinus tach - due to withdrawal. P:  Monitor hemodynamics.  RENAL A:   Mild hyponatremia - likely due to ETOH. P:   NS @ 75. BMP in AM. Replace electrolytes as indicated  HEMATOLOGIC A:   Mild anemia. Thrombocytopenia - due to bone marrow suppression from ETOH. VTE Prophylaxis. P:  Transfuse  for Hgb < 7. Monitor platelet counts. SCD's. CBC in AM.  INFECTIOUS A:   No indication of infection. P:   Monitor clinically.  ENDOCRINE A:   Hx hypothyroidism. P:   Continue preadmission synthroid. TSH pending.  Family updated: Patient updated bedside.  Interdisciplinary Family Meeting v Palliative Care Meeting:  Due by: 09/09/16.  Discussed with bedside RN and TRH-MD.  Transfer to SDU and to [facility] service with PCCM off 12/27.  Patient seen and examined, agree with above note.  I dictated the care and orders written for this patient under my direction.  Rush Farmer, MD [phone]  09/04/2016, 10:14 AM

## 2016-09-04 NOTE — Transfer of Care (Signed)
Immediate Anesthesia Transfer of Care Note  Patient: Dawn Foley  Procedure(s) Performed: Procedure(s): ESOPHAGOGASTRODUODENOSCOPY (EGD) WITH PROPOFOL (N/A)  Patient Location: Endoscopy Unit  Anesthesia Type:MAC  Level of Consciousness: awake, alert  and oriented  Airway & Oxygen Therapy: Patient Spontanous Breathing and Patient connected to nasal cannula oxygen  Post-op Assessment: Report given to RN and Post -op Vital signs reviewed and stable  Post vital signs: Reviewed and stable  Last Vitals:  Vitals:   09/04/16 1000 09/04/16 1022  BP: (!) 88/56 105/74  Pulse: 77 89  Resp: 16 15  Temp:  36.6 C    Last Pain:  Vitals:   09/04/16 1022  TempSrc: Oral  PainSc: 7          Complications: No apparent anesthesia complications

## 2016-09-04 NOTE — Progress Notes (Signed)
Dr. Mortimer Fries with CCM was made aware of morning platelet count of 48. No active bleeding noted. No new orders given. Will continue to monitor.

## 2016-09-04 NOTE — Anesthesia Preprocedure Evaluation (Addendum)
Anesthesia Evaluation  Patient identified by MRN, date of birth, ID band Patient awake    Reviewed: Allergy & Precautions, NPO status , Patient's Chart, lab work & pertinent test results  Airway Mallampati: II  TM Distance: >3 FB Neck ROM: Full    Dental  (+) Teeth Intact, Dental Advisory Given   Pulmonary    breath sounds clear to auscultation       Cardiovascular  Rhythm:Regular Rate:Normal     Neuro/Psych    GI/Hepatic   Endo/Other    Renal/GU      Musculoskeletal   Abdominal   Peds  Hematology   Anesthesia Other Findings   Reproductive/Obstetrics                             Anesthesia Physical Anesthesia Plan  ASA: III  Anesthesia Plan: MAC   Post-op Pain Management:    Induction:   Airway Management Planned: Natural Airway and Nasal Cannula  Additional Equipment:   Intra-op Plan:   Post-operative Plan:   Informed Consent: I have reviewed the patients History and Physical, chart, labs and discussed the procedure including the risks, benefits and alternatives for the proposed anesthesia with the patient or authorized representative who has indicated his/her understanding and acceptance.   Dental advisory given  Plan Discussed with: CRNA and Anesthesiologist  Anesthesia Plan Comments:        Anesthesia Quick Evaluation

## 2016-09-04 NOTE — Anesthesia Postprocedure Evaluation (Signed)
Anesthesia Post Note  Patient: Dawn Foley  Procedure(s) Performed: Procedure(s) (LRB): ESOPHAGOGASTRODUODENOSCOPY (EGD) WITH PROPOFOL (N/A)  Patient location during evaluation: PACU Anesthesia Type: General Level of consciousness: awake, awake and alert and oriented Pain management: pain level controlled Vital Signs Assessment: post-procedure vital signs reviewed and stable Respiratory status: spontaneous breathing, nonlabored ventilation and respiratory function stable Cardiovascular status: blood pressure returned to baseline Anesthetic complications: no       Last Vitals:  Vitals:   09/04/16 1205 09/04/16 1210  BP: (!) 94/42   Pulse: 71 65  Resp: 12 13  Temp:      Last Pain:  Vitals:   09/04/16 1131  TempSrc: Oral  PainSc:                  Richad Ramsay COKER

## 2016-09-05 ENCOUNTER — Encounter (HOSPITAL_COMMUNITY): Payer: Self-pay | Admitting: Gastroenterology

## 2016-09-05 DIAGNOSIS — F1010 Alcohol abuse, uncomplicated: Secondary | ICD-10-CM

## 2016-09-05 DIAGNOSIS — K920 Hematemesis: Secondary | ICD-10-CM

## 2016-09-05 DIAGNOSIS — F10239 Alcohol dependence with withdrawal, unspecified: Secondary | ICD-10-CM

## 2016-09-05 DIAGNOSIS — D62 Acute posthemorrhagic anemia: Secondary | ICD-10-CM

## 2016-09-05 LAB — BASIC METABOLIC PANEL
ANION GAP: 5 (ref 5–15)
BUN: 9 mg/dL (ref 6–20)
CALCIUM: 8.8 mg/dL — AB (ref 8.9–10.3)
CO2: 27 mmol/L (ref 22–32)
Chloride: 105 mmol/L (ref 101–111)
Creatinine, Ser: 0.57 mg/dL (ref 0.44–1.00)
Glucose, Bld: 90 mg/dL (ref 65–99)
Potassium: 4.1 mmol/L (ref 3.5–5.1)
Sodium: 137 mmol/L (ref 135–145)

## 2016-09-05 LAB — CBC
HEMATOCRIT: 31.1 % — AB (ref 36.0–46.0)
Hemoglobin: 9.6 g/dL — ABNORMAL LOW (ref 12.0–15.0)
MCH: 26.7 pg (ref 26.0–34.0)
MCHC: 30.9 g/dL (ref 30.0–36.0)
MCV: 86.6 fL (ref 78.0–100.0)
PLATELETS: 56 10*3/uL — AB (ref 150–400)
RBC: 3.59 MIL/uL — ABNORMAL LOW (ref 3.87–5.11)
RDW: 15.1 % (ref 11.5–15.5)
WBC: 3.1 10*3/uL — AB (ref 4.0–10.5)

## 2016-09-05 LAB — MAGNESIUM: MAGNESIUM: 1.9 mg/dL (ref 1.7–2.4)

## 2016-09-05 LAB — PHOSPHORUS: PHOSPHORUS: 3.9 mg/dL (ref 2.5–4.6)

## 2016-09-05 MED ORDER — METOCLOPRAMIDE HCL 5 MG/ML IJ SOLN
10.0000 mg | Freq: Four times a day (QID) | INTRAMUSCULAR | Status: DC
  Filled 2016-09-05: qty 2

## 2016-09-05 MED ORDER — LORAZEPAM 2 MG/ML IJ SOLN
2.0000 mg | INTRAMUSCULAR | Status: DC | PRN
  Administered 2016-09-05 – 2016-09-06 (×9): 2 mg via INTRAVENOUS
  Filled 2016-09-05 (×8): qty 1

## 2016-09-05 MED ORDER — METOCLOPRAMIDE HCL 5 MG/ML IJ SOLN
10.0000 mg | Freq: Four times a day (QID) | INTRAMUSCULAR | Status: DC | PRN
  Administered 2016-09-05 (×2): 10 mg via INTRAVENOUS
  Filled 2016-09-05: qty 2

## 2016-09-05 MED ORDER — PROCHLORPERAZINE EDISYLATE 5 MG/ML IJ SOLN
10.0000 mg | Freq: Four times a day (QID) | INTRAMUSCULAR | Status: DC | PRN
  Filled 2016-09-05: qty 2

## 2016-09-05 MED ORDER — SODIUM CHLORIDE 0.9 % IV SOLN
INTRAVENOUS | Status: DC
  Administered 2016-09-05: 11:00:00 via INTRAVENOUS

## 2016-09-05 NOTE — Progress Notes (Signed)
[facility] Progress Note  Chief Complaint: hematemesis  Subjective  History:  C/o upset stomach and "spitting up blood" Doesn't want to eat. Feels anxious and that she is going through alcohol withdrawal.  ROS: Cardiovascular:  no chest pain Respiratory: no dyspnea  Objective:  Med list reviewed  Vital signs in last 24 hrs: Vitals:   09/05/16 0700 09/05/16 0730  BP: 101/63 114/80  Pulse: 93 91  Resp:    Temp:  97.8 F (36.6 C)   Current BP 125/75, hr 90 Physical Exam Anxious.  oriented  HEENT: sclera anicteric, oral mucosa moist without lesions  Neck: supple, no thyromegaly, JVD or lymphadenopathy  Cardiac: RRR without murmurs, S1S2 heard, no peripheral edema  Pulm: clear to auscultation bilaterally, normal RR and effort noted  Abdomen: soft, mild epigastric tenderness, with active bowel sounds. No guarding or palpable hepatosplenomegaly  Skin; warm and dry, no jaundice or rash  Recent Labs:   Recent Labs Lab 09/03/16 2200 09/04/16 0258  WBC 3.4* 2.5*  HGB 10.3* 10.1*  HCT 32.5* 31.9*  PLT 51* 48*  AM Hgb pending  Recent Labs Lab 09/03/16 2200 09/04/16 0258  NA 133* 135  K 4.2 3.7  CL 95* 97*  CO2 34* 30  BUN 11 10  ALBUMIN 3.8  --   ALKPHOS 43  --   ALT 24  --   AST 39  --   GLUCOSE 89 78    Recent Labs Lab 09/04/16 0258  INR 1.09     @ASSESSMENTPLANBEGIN @ Assessment:  Hematemesis Mallory weiss tear Anemia of acute blood loss EtOh abuse   Plan: States she is allergic to zofran- unclear reaction.  So I changed it to compazine Needs twice daily PPI for 6 weeks and complete EtOh abstinence Soft diet for one week (ordered) Signing off - call as need arises.  Dawn Foley Pager [phone] Mon-Fri 8a-5p [phone] after 5p, weekends, holidays

## 2016-09-05 NOTE — Progress Notes (Signed)
Pt lying in bed crying; HR up to 140's; pt very agitated at this time stating she would like to speak with MD regarding her medicine; pt states MD mentioned prescribing "another pill" that would help with her anxiety; Ativan not able to be given until 2000 per current orders; pt stating she just wants to go home and drink alcohol; pt eating 100% of all meals today without complaints of nausea; pt forcing self to vomit at this time; MD paged to make aware; will await callback.  Dawn Foley

## 2016-09-05 NOTE — Evaluation (Signed)
Physical Therapy Evaluation Patient Details Name: Dawn Foley MRN: [id number] DOB: 1986-07-10 Today's Date: 09/05/2016   History of Present Illness  30 yo ETOH withdrawal, presents with UGI bleed  Clinical Impression  Patient seen for mobility assessment. At this time, ambulated increased distance with some instability and tremors noted. No physical assist required but min guard for safety. HR elevated with activity upper 140s. Will continue to benefit from acute PT to address mobility and activity deficits and maximize function. Will see as indicated and progress as tolerated.     Follow Up Recommendations No PT follow up    Equipment Recommendations  None recommended by PT    Recommendations for Other Services       Precautions / Restrictions Precautions Precautions: Fall Restrictions Weight Bearing Restrictions: No      Mobility  Bed Mobility Overal bed mobility: Modified Independent                Transfers Overall transfer level: Needs assistance Equipment used: None Transfers: Sit to/from Stand Sit to Stand: Supervision         General transfer comment: Supervision for safety, modest instability noted  Ambulation/Gait Ambulation/Gait assistance: Min guard Ambulation Distance (Feet): 410 Feet Assistive device: None Gait Pattern/deviations: Step-through pattern;Ataxic Gait velocity: decreased Gait velocity interpretation: Below normal speed for age/gender General Gait Details: some instability and ataxia noted with ambulation, easily distracted by environment  Stairs            Wheelchair Mobility    Modified Rankin (Stroke Patients Only)       Balance Overall balance assessment: Needs assistance   Sitting balance-Leahy Scale: Good       Standing balance-Leahy Scale: Fair Standing balance comment: some instability noted                             Pertinent Vitals/Pain Pain Assessment: Faces Faces Pain Scale:  Hurts little more Pain Location: generalized Pain Descriptors / Indicators: Discomfort Pain Intervention(s): Monitored during session    Home Living Family/patient expects to be discharged to:: Private residence Living Arrangements: Other (Comment) Available Help at Discharge: Family Type of Home: Mobile home Home Access: Stairs to enter Entrance Stairs-Rails: None Entrance Stairs-Number of Steps: 4 Home Layout: One level Home Equipment: None      Prior Function Level of Independence: Independent               Hand Dominance   Dominant Hand: Right    Extremity/Trunk Assessment   Upper Extremity Assessment Upper Extremity Assessment:  (bilateral tremors)    Lower Extremity Assessment Lower Extremity Assessment:  (bilateral tremors)    Cervical / Trunk Assessment Cervical / Trunk Assessment: Normal  Communication   Communication: No difficulties  Cognition Arousal/Alertness: Awake/alert Behavior During Therapy: Anxious Overall Cognitive Status: Within Functional Limits for tasks assessed                      General Comments      Exercises     Assessment/Plan    PT Assessment Patient needs continued PT services  PT Problem List Decreased activity tolerance;Decreased balance;Pain;Decreased coordination;Decreased mobility          PT Treatment Interventions DME instruction;Gait training;Stair training;Functional mobility training;Therapeutic activities;Therapeutic exercise;Balance training;Patient/family education    PT Goals (Current goals can be found in the Care Plan section)  Acute Rehab PT Goals Patient Stated Goal: to go home PT Goal Formulation:  With patient Time For Goal Achievement: 09/19/16 Potential to Achieve Goals: Good    Frequency Min 3X/week   Barriers to discharge        Co-evaluation               End of Session Equipment Utilized During Treatment: Gait belt Activity Tolerance: Patient tolerated treatment  well Patient left: in bed;with call bell/phone within reach Nurse Communication: Mobility status         Time: [id number] PT Time Calculation (min) (ACUTE ONLY): 18 min   Charges:   PT Evaluation $PT Eval Moderate Complexity: 1 Procedure     PT G Codes:        Duncan Dull 10/03/16, 4:19 PM Alben Deeds, Dix DPT  [phone]

## 2016-09-05 NOTE — Progress Notes (Signed)
PROGRESS NOTE    Dawn Foley  [id number] DOB: [birth year] DOA: 09/03/2016 PCP: Pcp Not In System   Brief Narrative: 30 y.o. female with PMH including long time ETOH abuse (admitted to [facility] in Sept 2017 as IVC after SI). She presented to [facility] ED 12/25 with ETOH withdrawal and coffee ground emesis with progression to hematemesis.  She had apparently not felt well for a few days prior and had decreased appetite with reported last drink 2 days prior.  Earlier on day of presentation, pt vomited coffee ground material then progressed to bright blood with clots.She was taken to [facility] where she was hemodynamically stable and had stable hgb.  She was later transferred to [facility] for further evaluation and management. Transferred from critical care to [facility] on 09/05/2016.  Assessment & Plan:   Active Problems:   Alcohol withdrawal (HCC)   Sinus tachycardia   Upper GI bleed   Thrombocytopenia (HCC)   History of hepatitis C   Alcohol abuse   Mallory-Weiss tear  # Acute upper GI bleeding likely due to [employer] tear: -In the setting of vomiting and alcohol abuse. Evaluated by gastroenterologist and underwent upper GI endoscopy which showed Mallory Weise tear. -Continue Protonix twice a day for at least 6 weeks, soft diet. -Patient is still reported nausea however vomiting is improving. Reported streaks of blood with vomiting today. Not able to tolerate diet well. Trying soft diet today. -Comparison and added Reglan for the management of intractable nausea and vomiting. -Continue supportive care  #Acute blood loss anemia in the setting of GI bleed: -Hemoglobin 9.6 today. Continue to monitor.  #Alcohol abuse with alcohol withdrawal: Patient with mild agitation, bilateral upper extremity tremors, tachycardia and not feeling well today likely symptoms of withdrawal. Continue CIWA protocol, vitamins. Added IV fluid. Encourage oral intake. -Ativan as needed. -Reportedly patient was admitted  at outside hospital a few weeks ago for DTs requiring ICU admission. We will keep patient in ICU today to monitor for any severe withdrawal symptoms. She was briefly on Precedex drip yesterday morning. Discussed with the patient's nurse. Patient is not ready to accept education regarding quitting alcohol this time.  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 Continue to monitor.  #History of hepatitis C: Recommended outpatient follow-up with GI or infectious disease.  #Hypothyroidism: Continue Synthroid.  DVT prophylaxis: SCD and early ambulation. No anticoagulation because of thrombocytopenia and upper GI bleed. Code Status: Full code Family Communication: No family present at bedside Disposition Plan: Likely discharge home in 1-2 days.  Consultants:   Gastroenterologist  Procedures: Upper GI endoscopy Antimicrobials: None  Subjective: Patient was seen and examined at bedside. Patient reported agitation, nausea, one episode of vomiting with streaks of blood this morning. Feels weak and not able to tolerate diet well. Denied headache, chest pain, shortness of breath, diarrhea or constipation.   Objective: Vitals:   09/05/16 0730 09/05/16 0800 09/05/16 0900 09/05/16 1000  BP: 114/80 113/75 121/81 112/72  Pulse: 91 91 (!) 122 (!) 102  Resp:  16 (!) 21 16  Temp: 97.8 F (36.6 C)     TempSrc: Oral     SpO2:  98% 100% 98%  Weight:      Height:        Intake/Output Summary (Last 24 hours) at 09/05/16 1109 Last data filed at 09/05/16 0947  Gross per 24 hour  Intake          1718.75 ml  Output  2700 ml  Net          -981.25 ml   Filed Weights   09/03/16 2104 09/04/16 0500 09/05/16 0500  Weight: 70.3 kg (154 lb 15.7 oz) 71.3 kg (157 lb 3 oz) 71.1 kg (156 lb 12 oz)    Examination:  General exam: Appears calm and comfortable  Respiratory system: Clear to auscultation. Respiratory effort normal. No wheezing  or crackle Cardiovascular system: S1 & S2 heard, Regular tachycardic.  No pedal edema. Gastrointestinal system: Abdomen is nondistended, soft and nontender. Normal bowel sounds heard. Central nervous system: Alert and oriented. No focal neurological deficits. Extremities: Symmetric 5 x 5 power. Bilateral upper extremities tremors Skin: No rashes, lesions or ulcers Psychiatry: Judgement and insight appear normal.      Data Reviewed: I have personally reviewed following labs and imaging studies  CBC:  Recent Labs Lab 09/03/16 2200 09/04/16 0258 09/05/16 0719  WBC 3.4* 2.5* 3.1*  HGB 10.3* 10.1* 9.6*  HCT 32.5* 31.9* 31.1*  MCV 85.3 85.8 86.6  PLT 51* 48* 56*   Basic Metabolic Panel:  Recent Labs Lab 09/03/16 2200 09/04/16 0258 09/05/16 0719  NA 133* 135 137  K 4.2 3.7 4.1  CL 95* 97* 105  CO2 34* 30 27  GLUCOSE 89 78 90  BUN 11 10 9   CREATININE 0.57  0.59 0.61 0.57  CALCIUM 8.8* 9.0 8.8*  MG  --   --  1.9  PHOS  --   --  3.9   GFR: Estimated Creatinine Clearance: 100.4 mL/min (by C-G formula based on SCr of 0.57 mg/dL). Liver Function Tests:  Recent Labs Lab 09/03/16 2200  AST 39  ALT 24  ALKPHOS 43  BILITOT 0.5  PROT 7.2  ALBUMIN 3.8   No results for input(s): LIPASE, AMYLASE in the last 168 hours. No results for input(s): AMMONIA in the last 168 hours. Coagulation Profile:  Recent Labs Lab 09/04/16 0258  INR 1.09   Cardiac Enzymes: No results for input(s): CKTOTAL, CKMB, CKMBINDEX, TROPONINI in the last 168 hours. BNP (last 3 results) No results for input(s): PROBNP in the last 8760 hours. HbA1C: No results for input(s): HGBA1C in the last 72 hours. CBG: No results for input(s): GLUCAP in the last 168 hours. Lipid Profile: No results for input(s): CHOL, HDL, LDLCALC, TRIG, CHOLHDL, LDLDIRECT in the last 72 hours. Thyroid Function Tests:  Recent Labs  09/04/16 0258  TSH 9.160*   Anemia Panel: No results for input(s): VITAMINB12,  FOLATE, FERRITIN, TIBC, IRON, RETICCTPCT in the last 72 hours. Sepsis Labs: No results for input(s): PROCALCITON, LATICACIDVEN in the last 168 hours.  Recent Results (from the past 240 hour(s))  MRSA PCR Screening     Status: None   Collection Time: 09/03/16  9:05 PM  Result Value Ref Range Status   MRSA by PCR NEGATIVE NEGATIVE Final    Comment:        The GeneXpert MRSA Assay (FDA approved for NASAL specimens only), is one component of a comprehensive MRSA colonization surveillance program. It is not intended to diagnose MRSA infection nor to guide or monitor treatment for MRSA infections.          Radiology Studies: No results found.      Scheduled Meds: . folic acid  1 mg Oral Daily  . levothyroxine  75 mcg Oral QAC breakfast  . multivitamin with minerals  1 tablet Oral Daily  . pantoprazole  40 mg Oral BID AC  . QUEtiapine  100 mg  Oral TID  . sodium chloride flush  3 mL Intravenous Q12H  . thiamine  100 mg Oral Daily   Continuous Infusions: . sodium chloride       LOS: 2 days    Tzippy Testerman Tanna Furry, MD [facility] Pager [phone]  If 7PM-7AM, please contact night-coverage [email or web address] Password [email or web address] 09/05/2016, 11:09 AM

## 2016-09-06 MED ORDER — THIAMINE HCL 100 MG PO TABS: 100.0000 mg | ORAL_TABLET | Freq: Every day | ORAL | 0 refills | Status: DC

## 2016-09-06 MED ORDER — LEVOTHYROXINE SODIUM 75 MCG PO TABS: 75.0000 ug | ORAL_TABLET | Freq: Every day | ORAL | 0 refills | Status: DC

## 2016-09-06 MED ORDER — PANTOPRAZOLE SODIUM 40 MG PO TBEC: 40.0000 mg | DELAYED_RELEASE_TABLET | Freq: Two times a day (BID) | ORAL | 0 refills | Status: DC

## 2016-09-06 MED ORDER — FOLIC ACID 1 MG PO TABS: 1.0000 mg | ORAL_TABLET | Freq: Every day | ORAL | 0 refills | Status: DC

## 2016-09-06 NOTE — Progress Notes (Signed)
Pt refusing morning lab draw.

## 2016-09-06 NOTE — Discharge Summary (Signed)
Physician Discharge Summary  Dawn Foley [id number] DOB: Jun 20, 1986 DOA: 09/03/2016  PCP: Pcp Not In System  Admit date: 09/03/2016 Discharge date: 09/06/2016  Admitted From:Home Disposition:home  Recommendations for Outpatient Follow-up:  1. Follow up with PCP in 1-2 weeks 2. Please obtain BMP/CBC in one week  Home Health:No Equipment/Devices: No Discharge Condition: Stable CODE STATUS: Full code Diet recommendation: Heart healthy  Brief/Interim Summary:30 y.o.femalewith PMH including long time ETOH abuse (admitted to [facility] in Sept 2017 as IVC after SI). She presented to [facility] ED 12/25 with ETOH withdrawal and coffee ground emesis with progression to hematemesis. She had apparently not felt well for a few days prior and had decreased appetite with reported last drink 2 days prior. Earlier on day of presentation, pt vomited coffee ground material then progressed to bright blood with clots.She was taken to [facility] where she was hemodynamically stable and had stable hgb. She was later transferred to [facility] for further evaluation and management.   # Acute upper GI bleeding likely due to [employer] tear: -In the setting of vomiting and alcohol abuse. Evaluated by gastroenterologist and underwent upper GI endoscopy which showed Mallory Weiss tear. -Continue Protonix twice a day for at least 6 weeks and then once a day. Recommended soft diet. -Patient is able to tolerate diet well. Denied nausea vomiting or abdominal pain. Patient is eager to go home today or as soon as possible. Education provided to the patient regarding outpatient follow-up and outpatient resources for alcohol abuse. Patient is stated that she already follows up with therapist outpatient. Education provided to the patient regarding follow-up with GI, soft diet and importance of medications. No further GI bleeding in the hospital.  #Acute blood loss anemia in the setting of GI bleed: -Hemoglobin 9.6 yesterday.  No further bleeding noticed. Patient refused blood test this morning.  #Alcohol abuse with alcohol withdrawal: Patient was treated for possible alcohol withdrawal with CIWA protocol. Patient has tachycardia but reported feeling good. She is very eager to go home as soon as possible. Asking to do discharge sooner. She said she will go to her mom and stay with the family. I advised patient to continue vitamin, healthy diet.  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 Continue to monitor. Recommended outpatient lab monitoring.  #History of hepatitis C: Recommended outpatient follow-up with GI or infectious disease.  #Hypothyroidism: Continue Synthroid. Prescription provided to the patient as per her request.  No further GI bleed. Clinically improving. Patient will be discharged home today as per her request. She is alert awake and oriented and verbalized understanding of discharge instructions and follow-up.   Discharge Diagnoses:  Active Problems:   Alcohol withdrawal (HCC)   Sinus tachycardia   Upper GI bleed   Thrombocytopenia (HCC)   History of hepatitis C   Alcohol abuse   Mallory-Weiss tear    Discharge Instructions  Discharge Instructions    Call MD for:  difficulty breathing, headache or visual disturbances    Complete by:  As directed    Call MD for:  hives    Complete by:  As directed    Call MD for:  persistant nausea and vomiting    Complete by:  As directed    Call MD for:  temperature >100.4    Complete by:  As directed    Diet - low sodium heart healthy    Complete by:  As directed    Discharge instructions  Complete by:  As directed    Please follow up with PCP or free clinic. Please follow up with social worker and detox program outpatient. Please do not drive.   Increase activity slowly    Complete by:  As directed      Allergies as of 09/06/2016      Reactions   Ondansetron Nausea And  Vomiting      Medication List    STOP taking these medications   traZODone 50 MG tablet Commonly known as:  DESYREL     TAKE these medications   folic acid 1 MG tablet Commonly known as:  FOLVITE Take 1 tablet (1 mg total) by mouth daily. Start taking on:  09/07/2016   gabapentin 300 MG capsule Commonly known as:  NEURONTIN Take 1 capsule (300 mg total) by mouth 4 (four) times daily. For agitation   hydrOXYzine 50 MG tablet Commonly known as:  ATARAX/VISTARIL Take 1 tablet (50 mg total) by mouth 3 (three) times daily as needed for anxiety.   levothyroxine 75 MCG tablet Commonly known as:  SYNTHROID, LEVOTHROID Take 1 tablet (75 mcg total) by mouth daily before breakfast. For thyroid hormone replacement   lip balm ointment Apply topically as needed for lip care.   multivitamin with minerals Tabs tablet Take 1 tablet by mouth daily. For low Vitamin   nicotine 21 mg/24hr patch Commonly known as:  NICODERM CQ - dosed in mg/24 hours Place 1 patch (21 mg total) onto the skin daily. For smoking cessation   pantoprazole 40 MG tablet Commonly known as:  PROTONIX Take 1 tablet (40 mg total) by mouth 2 (two) times daily before a meal. Take twice a day for 6 weeks and then once a day.   QUEtiapine 100 MG tablet Commonly known as:  SEROQUEL Take 1 tablet (100 mg total) by mouth 3 (three) times daily. For mood control What changed:  how much to take  additional instructions   thiamine 100 MG tablet Take 1 tablet (100 mg total) by mouth daily. Start taking on:  09/07/2016      Follow-up Information    [facility]. Schedule an appointment as soon as possible for a visit in 1 week(s).   Contact information: [place] [phone]         Allergies  Allergen Reactions  . Ondansetron Nausea And Vomiting    Consultations: Gastroenterology Critical care on  admission  Procedures/Studies: Upper GI endoscopy  Subjective: Patient was seen and examined at bedside. Requesting to be discharged sooner. Reported feeling good. No further GI bleed. Able to tolerate soft diet. Denied nausea vomiting or abdominal pain, chest pain or shortness of breath. No headache.   Discharge Exam: Vitals:   09/06/16 0900 09/06/16 1000  BP: 123/86 120/69  Pulse: (!) 120 (!) 136  Resp: 19 20  Temp:     Vitals:   09/06/16 0800 09/06/16 0840 09/06/16 0900 09/06/16 1000  BP: (!) 120/99  123/86 120/69  Pulse: 93  (!) 120 (!) 136  Resp: 20  19 20   Temp:  98.6 F (37 C)    TempSrc:  Oral    SpO2: 99%  99% 100%  Weight:      Height:        General: Pt is alert, awake, not in acute distress Cardiovascular: Regular tachycardic, S1/S2 +, no rubs, no gallops Respiratory: CTA bilaterally, no wheezing, no rhonchi Abdominal: Soft, NT, ND, bowel sounds +  Extremities: no edema, no cyanosis Neurology: Alert awake oriented 3, nonfocal neurological exam Psychiatric: Denied depression, suicidal or homicidal ideation.   The results of significant diagnostics from this hospitalization (including imaging, microbiology, ancillary and laboratory) are listed below for reference.     Microbiology: Recent Results (from the past 240 hour(s))  MRSA PCR Screening     Status: None   Collection Time: 09/03/16  9:05 PM  Result Value Ref Range Status   MRSA by PCR NEGATIVE NEGATIVE Final    Comment:        The GeneXpert MRSA Assay (FDA approved for NASAL specimens only), is one component of a comprehensive MRSA colonization surveillance program. It is not intended to diagnose MRSA infection nor to guide or monitor treatment for MRSA infections.      Labs: BNP (last 3 results) No results for input(s): BNP in the last 8760 hours. Basic Metabolic Panel:  Recent Labs Lab 09/03/16 2200 09/04/16 0258 09/05/16 0719  NA 133* 135 137  K 4.2 3.7 4.1  CL 95* 97* 105   CO2 34* 30 27  GLUCOSE 89 78 90  BUN 11 10 9   CREATININE 0.57  0.59 0.61 0.57  CALCIUM 8.8* 9.0 8.8*  MG  --   --  1.9  PHOS  --   --  3.9   Liver Function Tests:  Recent Labs Lab 09/03/16 2200  AST 39  ALT 24  ALKPHOS 43  BILITOT 0.5  PROT 7.2  ALBUMIN 3.8   No results for input(s): LIPASE, AMYLASE in the last 168 hours. No results for input(s): AMMONIA in the last 168 hours. CBC:  Recent Labs Lab 09/03/16 2200 09/04/16 0258 09/05/16 0719  WBC 3.4* 2.5* 3.1*  HGB 10.3* 10.1* 9.6*  HCT 32.5* 31.9* 31.1*  MCV 85.3 85.8 86.6  PLT 51* 48* 56*   Cardiac Enzymes: No results for input(s): CKTOTAL, CKMB, CKMBINDEX, TROPONINI in the last 168 hours. BNP: Invalid input(s): POCBNP CBG: No results for input(s): GLUCAP in the last 168 hours. D-Dimer No results for input(s): DDIMER in the last 72 hours. Hgb A1c No results for input(s): HGBA1C in the last 72 hours. Lipid Profile No results for input(s): CHOL, HDL, LDLCALC, TRIG, CHOLHDL, LDLDIRECT in the last 72 hours. Thyroid function studies  Recent Labs  09/04/16 0258  TSH 9.160*   Anemia work up No results for input(s): VITAMINB12, FOLATE, FERRITIN, TIBC, IRON, RETICCTPCT in the last 72 hours. Urinalysis    Component Value Date/Time   COLORURINE YELLOW 06/04/2016 1019   APPEARANCEUR CLEAR 06/04/2016 1019   LABSPEC 1.003 (L) 06/04/2016 1019   PHURINE 6.5 06/04/2016 1019   GLUCOSEU NEGATIVE 06/04/2016 1019   HGBUR NEGATIVE 06/04/2016 1019   BILIRUBINUR NEGATIVE 06/04/2016 1019   KETONESUR NEGATIVE 06/04/2016 1019   PROTEINUR NEGATIVE 06/04/2016 1019   UROBILINOGEN 0.2 02/10/2013 1921   NITRITE NEGATIVE 06/04/2016 1019   LEUKOCYTESUR NEGATIVE 06/04/2016 1019   Sepsis Labs Invalid input(s): PROCALCITONIN,  WBC,  LACTICIDVEN Microbiology Recent Results (from the past 240 hour(s))  MRSA PCR Screening     Status: None   Collection Time: 09/03/16  9:05 PM  Result Value Ref Range Status   MRSA by PCR  NEGATIVE NEGATIVE Final    Comment:        The GeneXpert MRSA Assay (FDA approved for NASAL specimens only), is one component of a comprehensive MRSA colonization surveillance program. It is not intended to diagnose MRSA infection nor to guide or monitor treatment for MRSA infections.  Time coordinating discharge: 26 minutes  SIGNED:   Rosita Fire, MD  [facility] 09/06/2016, 10:28 AM  If 7PM-7AM, please contact night-coverage [email or web address] Password [email or web address]

## 2016-09-06 NOTE — Care Management Note (Addendum)
Case Management Note  Patient Details  Name: Dawn Foley MRN: [id number] Date of Birth: [birth year]  Subjective/Objective:     Pt admitted with ETOH withdrawl               Action/Plan:  CM spoke with pt - pt is alert and oriented and stated she is ready to go home.  Pt initially stated she sees a PCP at the [facility] and is able to get medications from pharmacy without hardship.  However during the same converstation pt told me that she has never went to free clinic and only gets medications from [facility] again denied hardship with paying for medications.Pt declined for CM to arrange post discharge follow up appt and a consult with CSW for current substance abuse - stated that she sees an outpt therapist on a regular basis - declined to provide name.  Pt stated she will return home with her parents and children - denied all CM and any discharge needs.  Due to inconsistencies in assessment - CM provided pt list of free clinics in [place] area.    Expected Discharge Date:                  Expected Discharge Plan:  Home/Self Care  In-House Referral:     Discharge planning Services  CM Consult  Post Acute Care Choice:    Choice offered to:     DME Arranged:    DME Agency:     HH Arranged:    HH Agency:     Status of Service:  In process, will continue to follow  If discussed at Long Length of Stay Meetings, dates discussed:   CM spoke with pt again prior to discharge and was told that pt does in fact need assistance with some medications cost.  CM provided [place] letter and explained program including this is a one time fill.  CM reiterated the importance of pt following up with free clinics to establish a PCP in addition in order to be enrolled in medication assistance program.  CM also reviewed medications currently prescribed on the generic copay list at [place], RN 09/06/2016, 9:55 AM

## 2016-09-06 NOTE — Progress Notes (Signed)
Pt given d/c instructions; pts mom to pick pt up and take pt home; mom to arrive by 12 today; pt requesting to keep IV in place to receive IV ativan until d/c; pt given prescriptions and d/c instructions.  Dawn Foley

## 2016-09-06 NOTE — Progress Notes (Signed)
PT Cancellation Note  Patient Details Name: [name] MRN: [id number] DOB: [birth year]   Cancelled Treatment:    Reason Eval/Treat Not Completed: Per RN pt ripped off all leads at 4am this morning and ran around the unit without fall or instability. Observed pt going to/from bathroom without difficulty or instability. Pt very adamant about leaving today to return to drinking. Pt with no skilled PT needs at this time. If pt still here next week will check on functional status.    Charlee Squibb M Leighann Amadon 09/06/2016, 10:28 AM   Kittie Plater, PT, DPT Pager #: [phone] Office #: [phone]

## 2016-09-06 NOTE — Progress Notes (Signed)
Pt came out of room in a brisk walk, saying "I gotta get out of here". Pt with a panicked look on face, rambling about a pt who passed away earlier in the day. I talked/walked her back to her room, re attached monitoring equipment, while explaining the importance of asking for help before removing all of her monitoring equipment and going for a walk unattended. Pt now refusing IV fluids. I will continue to monitor.

## 2016-09-12 NOTE — Addendum Note (Signed)
Addendum  created 09/12/16 2304 by Roberts Gaudy, MD   Sign clinical note

## 2016-09-12 NOTE — Anesthesia Postprocedure Evaluation (Signed)
Anesthesia Post Note  Patient: Dawn Foley  Procedure(s) Performed: Procedure(s) (LRB): ESOPHAGOGASTRODUODENOSCOPY (EGD) WITH PROPOFOL (N/A)  Patient location during evaluation: Endoscopy Anesthesia Type: MAC Level of consciousness: awake, awake and alert and oriented Pain management: pain level controlled Vital Signs Assessment: post-procedure vital signs reviewed and stable Respiratory status: spontaneous breathing and nonlabored ventilation Cardiovascular status: blood pressure returned to baseline Anesthetic complications: no       Last Vitals:  Vitals:   09/06/16 1000 09/06/16 1100  BP: 120/69   Pulse: (!) 136 (!) 109  Resp: 20 (!) 22  Temp:      Last Pain:  Vitals:   09/06/16 0840  TempSrc: Oral  PainSc:                  Pavneet Markwood COKER

## 2016-10-19 DIAGNOSIS — D61818 Other pancytopenia: Secondary | ICD-10-CM

## 2016-10-19 DIAGNOSIS — F329 Major depressive disorder, single episode, unspecified: Secondary | ICD-10-CM

## 2016-10-19 DIAGNOSIS — E039 Hypothyroidism, unspecified: Secondary | ICD-10-CM

## 2016-10-19 DIAGNOSIS — Z720 Tobacco use: Secondary | ICD-10-CM

## 2016-10-19 DIAGNOSIS — F10239 Alcohol dependence with withdrawal, unspecified: Secondary | ICD-10-CM

## 2016-12-06 DIAGNOSIS — E039 Hypothyroidism, unspecified: Secondary | ICD-10-CM

## 2016-12-06 DIAGNOSIS — F10239 Alcohol dependence with withdrawal, unspecified: Secondary | ICD-10-CM

## 2016-12-06 DIAGNOSIS — F419 Anxiety disorder, unspecified: Secondary | ICD-10-CM

## 2016-12-06 DIAGNOSIS — R748 Abnormal levels of other serum enzymes: Secondary | ICD-10-CM

## 2016-12-06 DIAGNOSIS — D61818 Other pancytopenia: Secondary | ICD-10-CM

## 2017-01-13 DIAGNOSIS — R748 Abnormal levels of other serum enzymes: Secondary | ICD-10-CM

## 2017-01-13 DIAGNOSIS — F329 Major depressive disorder, single episode, unspecified: Secondary | ICD-10-CM

## 2017-01-13 DIAGNOSIS — F10239 Alcohol dependence with withdrawal, unspecified: Secondary | ICD-10-CM

## 2017-01-13 DIAGNOSIS — D696 Thrombocytopenia, unspecified: Secondary | ICD-10-CM

## 2017-01-13 DIAGNOSIS — F1020 Alcohol dependence, uncomplicated: Secondary | ICD-10-CM

## 2017-01-13 DIAGNOSIS — E871 Hypo-osmolality and hyponatremia: Secondary | ICD-10-CM

## 2017-01-13 DIAGNOSIS — E872 Acidosis: Secondary | ICD-10-CM

## 2017-01-16 DIAGNOSIS — F10232 Alcohol dependence with withdrawal with perceptual disturbance: Secondary | ICD-10-CM

## 2017-01-16 DIAGNOSIS — E039 Hypothyroidism, unspecified: Secondary | ICD-10-CM

## 2017-02-05 ENCOUNTER — Ambulatory Visit (HOSPITAL_COMMUNITY)
Admission: RE | Admit: 2017-02-05 | Discharge: 2017-02-05 | Disposition: A | Discharge status: Home / Self Care | Payer: Medicaid Other | Attending: Psychiatry | Admitting: Psychiatry

## 2017-02-05 ENCOUNTER — Emergency Department (HOSPITAL_COMMUNITY)
Admission: EM | Admit: 2017-02-05 | Discharge: 2017-02-06 | Disposition: A | Discharge status: Other Acute Inpatient Hospital | Payer: Medicaid Other | Attending: Emergency Medicine | Admitting: Emergency Medicine

## 2017-02-05 ENCOUNTER — Encounter (HOSPITAL_COMMUNITY): Payer: Self-pay

## 2017-02-05 DIAGNOSIS — F10129 Alcohol abuse with intoxication, unspecified: Secondary | ICD-10-CM | POA: Insufficient documentation

## 2017-02-05 DIAGNOSIS — R000 Tachycardia, unspecified: Secondary | ICD-10-CM | POA: Insufficient documentation

## 2017-02-05 DIAGNOSIS — Y999 Unspecified external cause status: Secondary | ICD-10-CM | POA: Insufficient documentation

## 2017-02-05 DIAGNOSIS — F10239 Alcohol dependence with withdrawal, unspecified: Secondary | ICD-10-CM

## 2017-02-05 DIAGNOSIS — S40021A Contusion of right upper arm, initial encounter: Secondary | ICD-10-CM | POA: Insufficient documentation

## 2017-02-05 DIAGNOSIS — F418 Other specified anxiety disorders: Secondary | ICD-10-CM | POA: Insufficient documentation

## 2017-02-05 DIAGNOSIS — S8012XA Contusion of left lower leg, initial encounter: Secondary | ICD-10-CM | POA: Insufficient documentation

## 2017-02-05 DIAGNOSIS — F1010 Alcohol abuse, uncomplicated: Secondary | ICD-10-CM

## 2017-02-05 DIAGNOSIS — S40022A Contusion of left upper arm, initial encounter: Secondary | ICD-10-CM | POA: Insufficient documentation

## 2017-02-05 DIAGNOSIS — F4323 Adjustment disorder with mixed anxiety and depressed mood: Secondary | ICD-10-CM

## 2017-02-05 DIAGNOSIS — Y929 Unspecified place or not applicable: Secondary | ICD-10-CM | POA: Insufficient documentation

## 2017-02-05 DIAGNOSIS — T7491XA Unspecified adult maltreatment, confirmed, initial encounter: Secondary | ICD-10-CM

## 2017-02-05 DIAGNOSIS — Y939 Activity, unspecified: Secondary | ICD-10-CM | POA: Insufficient documentation

## 2017-02-05 DIAGNOSIS — S8011XA Contusion of right lower leg, initial encounter: Secondary | ICD-10-CM | POA: Insufficient documentation

## 2017-02-05 DIAGNOSIS — F10939 Alcohol use, unspecified with withdrawal, unspecified: Secondary | ICD-10-CM

## 2017-02-05 DIAGNOSIS — F10929 Alcohol use, unspecified with intoxication, unspecified: Secondary | ICD-10-CM

## 2017-02-05 DIAGNOSIS — Z79899 Other long term (current) drug therapy: Secondary | ICD-10-CM | POA: Insufficient documentation

## 2017-02-05 DIAGNOSIS — T7411XA Adult physical abuse, confirmed, initial encounter: Secondary | ICD-10-CM | POA: Insufficient documentation

## 2017-02-05 LAB — RAPID URINE DRUG SCREEN, HOSP PERFORMED
Amphetamines: NOT DETECTED
Barbiturates: NOT DETECTED
Benzodiazepines: POSITIVE — AB
COCAINE: NOT DETECTED
OPIATES: NOT DETECTED
TETRAHYDROCANNABINOL: NOT DETECTED

## 2017-02-05 LAB — CBC
HCT: 33.8 % — ABNORMAL LOW (ref 36.0–46.0)
HEMOGLOBIN: 10.7 g/dL — AB (ref 12.0–15.0)
MCH: 25.1 pg — ABNORMAL LOW (ref 26.0–34.0)
MCHC: 31.7 g/dL (ref 30.0–36.0)
MCV: 79.3 fL (ref 78.0–100.0)
Platelets: 54 10*3/uL — ABNORMAL LOW (ref 150–400)
RBC: 4.26 MIL/uL (ref 3.87–5.11)
RDW: 21.1 % — AB (ref 11.5–15.5)
WBC: 4.6 10*3/uL (ref 4.0–10.5)

## 2017-02-05 LAB — COMPREHENSIVE METABOLIC PANEL
ALT: 41 U/L (ref 14–54)
AST: 106 U/L — AB (ref 15–41)
Albumin: 3.4 g/dL — ABNORMAL LOW (ref 3.5–5.0)
Alkaline Phosphatase: 84 U/L (ref 38–126)
Anion gap: 12 (ref 5–15)
BILIRUBIN TOTAL: 0.3 mg/dL (ref 0.3–1.2)
BUN: <5 mg/dL — ABNORMAL LOW (ref 6–20)
CO2: 21 mmol/L — ABNORMAL LOW (ref 22–32)
Calcium: 9.1 mg/dL (ref 8.9–10.3)
Chloride: 94 mmol/L — ABNORMAL LOW (ref 101–111)
Creatinine, Ser: 0.52 mg/dL (ref 0.44–1.00)
Glucose, Bld: 93 mg/dL (ref 65–99)
POTASSIUM: 3.3 mmol/L — AB (ref 3.5–5.1)
Sodium: 127 mmol/L — ABNORMAL LOW (ref 135–145)
TOTAL PROTEIN: 8.7 g/dL — AB (ref 6.5–8.1)

## 2017-02-05 LAB — ETHANOL: ALCOHOL ETHYL (B): 173 mg/dL — AB (ref <5)

## 2017-02-05 MED ORDER — ADULT MULTIVITAMIN W/MINERALS CH
1.0000 | ORAL_TABLET | Freq: Every day | ORAL | Status: DC
  Administered 2017-02-06: 1 via ORAL
  Filled 2017-02-05: qty 1

## 2017-02-05 MED ORDER — CHLORDIAZEPOXIDE HCL 25 MG PO CAPS
25.0000 mg | ORAL_CAPSULE | Freq: Four times a day (QID) | ORAL | Status: DC | PRN
  Administered 2017-02-06 (×2): 25 mg via ORAL
  Filled 2017-02-05 (×2): qty 1

## 2017-02-05 MED ORDER — VITAMIN B-1 100 MG PO TABS
100.0000 mg | ORAL_TABLET | Freq: Every day | ORAL | Status: DC
  Administered 2017-02-06: 100 mg via ORAL
  Filled 2017-02-05: qty 1

## 2017-02-05 MED ORDER — HYDROXYZINE HCL 25 MG PO TABS: 25.0000 mg | ORAL_TABLET | Freq: Four times a day (QID) | ORAL | Status: DC | PRN

## 2017-02-05 MED ORDER — HYDROXYZINE HCL 50 MG/ML IM SOLN
50.0000 mg | Freq: Four times a day (QID) | INTRAMUSCULAR | Status: DC | PRN
  Administered 2017-02-05: 50 mg via INTRAMUSCULAR
  Filled 2017-02-05: qty 1

## 2017-02-05 MED ORDER — LOPERAMIDE HCL 2 MG PO CAPS: 2.0000 mg | ORAL_CAPSULE | ORAL | Status: DC | PRN

## 2017-02-05 MED ORDER — THIAMINE HCL 100 MG/ML IJ SOLN: 100.0000 mg | Freq: Once | INTRAMUSCULAR | Status: DC

## 2017-02-05 NOTE — ED Notes (Signed)
MD was made aware that pt had a CIWA of 26. Pt is very agitated and very anxious. MD does not want to administer anything at this time but Vistaril.
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
## 2017-02-05 NOTE — ED Triage Notes (Signed)
Presents to the ed with complaints of detoxing from alcohol her last drink was yesterday at this time. Reports drinking half of a gallon of whiskey a day. She had a seizure today at 1800 that lasted a minute. Presents anxious and upset.

## 2017-02-05 NOTE — ED Notes (Signed)
Pt very emotional. Pt speaking to family member on the phone. Pt stating that she "wants to go home". Pt wanting to see ED provider. Informed Levada Dy - RN.

## 2017-02-06 ENCOUNTER — Encounter (HOSPITAL_COMMUNITY): Payer: Self-pay

## 2017-02-06 ENCOUNTER — Inpatient Hospital Stay (HOSPITAL_COMMUNITY)
Admission: AD | Admit: 2017-02-06 | Discharge: 2017-02-13 | DRG: 885 | Disposition: A | Discharge status: Home / Self Care | Payer: No Typology Code available for payment source | Source: Intra-hospital | Attending: Psychiatry | Admitting: Psychiatry

## 2017-02-06 DIAGNOSIS — E079 Disorder of thyroid, unspecified: Secondary | ICD-10-CM | POA: Diagnosis not present

## 2017-02-06 DIAGNOSIS — Y906 Blood alcohol level of 120-199 mg/100 ml: Secondary | ICD-10-CM | POA: Diagnosis present

## 2017-02-06 DIAGNOSIS — E039 Hypothyroidism, unspecified: Secondary | ICD-10-CM | POA: Diagnosis present

## 2017-02-06 DIAGNOSIS — F1020 Alcohol dependence, uncomplicated: Secondary | ICD-10-CM | POA: Diagnosis present

## 2017-02-06 DIAGNOSIS — R45851 Suicidal ideations: Secondary | ICD-10-CM | POA: Diagnosis present

## 2017-02-06 DIAGNOSIS — Z8719 Personal history of other diseases of the digestive system: Secondary | ICD-10-CM | POA: Diagnosis not present

## 2017-02-06 DIAGNOSIS — F17210 Nicotine dependence, cigarettes, uncomplicated: Secondary | ICD-10-CM | POA: Diagnosis not present

## 2017-02-06 DIAGNOSIS — Z79899 Other long term (current) drug therapy: Secondary | ICD-10-CM | POA: Diagnosis not present

## 2017-02-06 DIAGNOSIS — Z888 Allergy status to other drugs, medicaments and biological substances status: Secondary | ICD-10-CM

## 2017-02-06 DIAGNOSIS — G4700 Insomnia, unspecified: Secondary | ICD-10-CM | POA: Diagnosis present

## 2017-02-06 DIAGNOSIS — E519 Thiamine deficiency, unspecified: Secondary | ICD-10-CM | POA: Diagnosis present

## 2017-02-06 DIAGNOSIS — K0889 Other specified disorders of teeth and supporting structures: Secondary | ICD-10-CM | POA: Diagnosis present

## 2017-02-06 DIAGNOSIS — F333 Major depressive disorder, recurrent, severe with psychotic symptoms: Principal | ICD-10-CM | POA: Diagnosis present

## 2017-02-06 DIAGNOSIS — Z590 Homelessness: Secondary | ICD-10-CM | POA: Diagnosis not present

## 2017-02-06 DIAGNOSIS — F419 Anxiety disorder, unspecified: Secondary | ICD-10-CM | POA: Diagnosis present

## 2017-02-06 DIAGNOSIS — K7010 Alcoholic hepatitis without ascites: Secondary | ICD-10-CM | POA: Diagnosis not present

## 2017-02-06 DIAGNOSIS — F339 Major depressive disorder, recurrent, unspecified: Secondary | ICD-10-CM | POA: Diagnosis not present

## 2017-02-06 LAB — HCG, QUANTITATIVE, PREGNANCY

## 2017-02-06 MED ORDER — LORAZEPAM 1 MG PO TABS
0.0000 mg | ORAL_TABLET | Freq: Four times a day (QID) | ORAL | Status: DC
  Administered 2017-02-06: 2 mg via ORAL
  Administered 2017-02-06: 4 mg via ORAL
  Administered 2017-02-06: 2 mg via ORAL
  Filled 2017-02-06: qty 2
  Filled 2017-02-06: qty 4
  Filled 2017-02-06: qty 2

## 2017-02-06 MED ORDER — LEVOTHYROXINE SODIUM 75 MCG PO TABS
75.0000 ug | ORAL_TABLET | Freq: Every day | ORAL | Status: DC
  Administered 2017-02-07 – 2017-02-13 (×7): 75 ug via ORAL
  Filled 2017-02-06 (×7): qty 1
  Filled 2017-02-06: qty 3
  Filled 2017-02-06 (×2): qty 1

## 2017-02-06 MED ORDER — CHLORDIAZEPOXIDE HCL 25 MG PO CAPS: 25.0000 mg | ORAL_CAPSULE | Freq: Four times a day (QID) | ORAL | Status: DC

## 2017-02-06 MED ORDER — MAGNESIUM HYDROXIDE 400 MG/5ML PO SUSP: 30.0000 mL | Freq: Every day | ORAL | Status: DC | PRN

## 2017-02-06 MED ORDER — ACETAMINOPHEN 325 MG PO TABS
650.0000 mg | ORAL_TABLET | ORAL | Status: DC | PRN
  Administered 2017-02-06 (×3): 650 mg via ORAL
  Filled 2017-02-06 (×3): qty 2

## 2017-02-06 MED ORDER — ALUM & MAG HYDROXIDE-SIMETH 200-200-20 MG/5ML PO SUSP: 30.0000 mL | Freq: Four times a day (QID) | ORAL | Status: DC | PRN

## 2017-02-06 MED ORDER — LORAZEPAM 1 MG PO TABS
1.0000 mg | ORAL_TABLET | Freq: Three times a day (TID) | ORAL | Status: AC
  Administered 2017-02-08 – 2017-02-09 (×3): 1 mg via ORAL
  Filled 2017-02-06 (×3): qty 1

## 2017-02-06 MED ORDER — THIAMINE HCL 100 MG/ML IJ SOLN: 100.0000 mg | Freq: Once | INTRAMUSCULAR | Status: DC

## 2017-02-06 MED ORDER — ACETAMINOPHEN 325 MG PO TABS
650.0000 mg | ORAL_TABLET | Freq: Four times a day (QID) | ORAL | Status: DC | PRN
  Administered 2017-02-06: 650 mg via ORAL
  Filled 2017-02-06: qty 2

## 2017-02-06 MED ORDER — THIAMINE HCL 100 MG/ML IJ SOLN: 100.0000 mg | Freq: Every day | INTRAMUSCULAR | Status: DC

## 2017-02-06 MED ORDER — LORAZEPAM 2 MG/ML IJ SOLN: 0.0000 mg | Freq: Two times a day (BID) | INTRAMUSCULAR | Status: DC

## 2017-02-06 MED ORDER — ZOLPIDEM TARTRATE 5 MG PO TABS
5.0000 mg | ORAL_TABLET | Freq: Every evening | ORAL | Status: DC | PRN
  Administered 2017-02-06: 5 mg via ORAL
  Filled 2017-02-06: qty 1

## 2017-02-06 MED ORDER — CHLORDIAZEPOXIDE HCL 25 MG PO CAPS: 25.0000 mg | ORAL_CAPSULE | Freq: Every day | ORAL | Status: DC

## 2017-02-06 MED ORDER — CHLORDIAZEPOXIDE HCL 25 MG PO CAPS: 25.0000 mg | ORAL_CAPSULE | Freq: Three times a day (TID) | ORAL | Status: DC

## 2017-02-06 MED ORDER — LOPERAMIDE HCL 2 MG PO CAPS: 2.0000 mg | ORAL_CAPSULE | ORAL | Status: AC | PRN

## 2017-02-06 MED ORDER — TRAZODONE HCL 50 MG PO TABS
50.0000 mg | ORAL_TABLET | Freq: Every evening | ORAL | Status: DC | PRN
  Administered 2017-02-06 – 2017-02-08 (×2): 50 mg via ORAL
  Filled 2017-02-06 (×2): qty 1

## 2017-02-06 MED ORDER — ALUM & MAG HYDROXIDE-SIMETH 200-200-20 MG/5ML PO SUSP: 30.0000 mL | ORAL | Status: DC | PRN

## 2017-02-06 MED ORDER — ADULT MULTIVITAMIN W/MINERALS CH
1.0000 | ORAL_TABLET | Freq: Every day | ORAL | Status: DC
  Filled 2017-02-06 (×2): qty 1

## 2017-02-06 MED ORDER — LORAZEPAM 2 MG/ML IJ SOLN: 0.0000 mg | Freq: Four times a day (QID) | INTRAMUSCULAR | Status: DC

## 2017-02-06 MED ORDER — LORAZEPAM 1 MG PO TABS
1.0000 mg | ORAL_TABLET | Freq: Two times a day (BID) | ORAL | Status: AC
  Administered 2017-02-09 – 2017-02-10 (×2): 1 mg via ORAL
  Filled 2017-02-06 (×2): qty 1

## 2017-02-06 MED ORDER — LORAZEPAM 1 MG PO TABS
1.0000 mg | ORAL_TABLET | Freq: Every day | ORAL | Status: AC
  Administered 2017-02-11: 1 mg via ORAL
  Filled 2017-02-06: qty 1

## 2017-02-06 MED ORDER — VITAMIN B-1 100 MG PO TABS
100.0000 mg | ORAL_TABLET | Freq: Every day | ORAL | Status: DC
  Administered 2017-02-06: 100 mg via ORAL
  Filled 2017-02-06: qty 1

## 2017-02-06 MED ORDER — LORAZEPAM 1 MG PO TABS: 0.0000 mg | ORAL_TABLET | Freq: Two times a day (BID) | ORAL | Status: DC

## 2017-02-06 MED ORDER — VITAMIN B-1 100 MG PO TABS
100.0000 mg | ORAL_TABLET | Freq: Every day | ORAL | Status: DC
  Administered 2017-02-07 – 2017-02-13 (×7): 100 mg via ORAL
  Filled 2017-02-06 (×9): qty 1

## 2017-02-06 MED ORDER — NICOTINE 21 MG/24HR TD PT24
21.0000 mg | MEDICATED_PATCH | Freq: Every day | TRANSDERMAL | Status: DC
  Administered 2017-02-06: 21 mg via TRANSDERMAL
  Filled 2017-02-06: qty 1

## 2017-02-06 MED ORDER — ADULT MULTIVITAMIN W/MINERALS CH
1.0000 | ORAL_TABLET | Freq: Every day | ORAL | Status: DC
  Administered 2017-02-07 – 2017-02-13 (×7): 1 via ORAL
  Filled 2017-02-06 (×8): qty 1

## 2017-02-06 MED ORDER — CHLORDIAZEPOXIDE HCL 25 MG PO CAPS
25.0000 mg | ORAL_CAPSULE | Freq: Four times a day (QID) | ORAL | Status: DC | PRN
  Administered 2017-02-06: 25 mg via ORAL
  Filled 2017-02-06: qty 1

## 2017-02-06 MED ORDER — HYDROXYZINE HCL 25 MG PO TABS
25.0000 mg | ORAL_TABLET | Freq: Four times a day (QID) | ORAL | Status: DC | PRN
  Administered 2017-02-06 – 2017-02-12 (×8): 25 mg via ORAL
  Filled 2017-02-06 (×10): qty 1

## 2017-02-06 MED ORDER — LORAZEPAM 1 MG PO TABS
1.0000 mg | ORAL_TABLET | Freq: Four times a day (QID) | ORAL | Status: AC
  Administered 2017-02-06 – 2017-02-08 (×6): 1 mg via ORAL
  Filled 2017-02-06 (×6): qty 1

## 2017-02-06 MED ORDER — NICOTINE 21 MG/24HR TD PT24
21.0000 mg | MEDICATED_PATCH | Freq: Every day | TRANSDERMAL | Status: DC
  Administered 2017-02-07 – 2017-02-13 (×7): 21 mg via TRANSDERMAL
  Filled 2017-02-06 (×9): qty 1

## 2017-02-06 MED ORDER — CHLORDIAZEPOXIDE HCL 25 MG PO CAPS: 25.0000 mg | ORAL_CAPSULE | ORAL | Status: DC

## 2017-02-06 MED ORDER — LORAZEPAM 1 MG PO TABS
1.0000 mg | ORAL_TABLET | Freq: Four times a day (QID) | ORAL | Status: AC | PRN
  Administered 2017-02-07 – 2017-02-09 (×4): 1 mg via ORAL
  Filled 2017-02-06 (×4): qty 1

## 2017-02-06 MED ORDER — VITAMIN B-1 100 MG PO TABS
100.0000 mg | ORAL_TABLET | Freq: Every day | ORAL | Status: DC
  Filled 2017-02-06: qty 1

## 2017-02-06 MED ORDER — IBUPROFEN 400 MG PO TABS: 600.0000 mg | ORAL_TABLET | Freq: Three times a day (TID) | ORAL | Status: DC | PRN
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
## 2017-02-06 NOTE — Tx Team (Signed)
Initial Treatment Plan 02/06/2017 9:14 PM Kjerstin MARVELOUS [place]OLFORD HER:[id number]    PATIENT STRESSORS: Financial difficulties Health problems Marital or family conflict Substance abuse Traumatic event   PATIENT STRENGTHS: Capable of independent living Communication skills General fund of knowledge   PATIENT IDENTIFIED PROBLEMS: Depression  Suicidal ideation  Psychosis  Substance abuse  "I want to detox from alcohol"  "I want to go to a long term treatment program, but not too long"           DISCHARGE CRITERIA:  Improved stabilization in mood, thinking, and/or behavior Motivation to continue treatment in a less acute level of care Verbal commitment to aftercare and medication compliance Withdrawal symptoms are absent or subacute and managed without 24-hour nursing intervention  PRELIMINARY DISCHARGE PLAN: Outpatient therapy Medication management  PATIENT/FAMILY INVOLVEMENT: This treatment plan has been presented to and reviewed with the patient, MARQUIS DILES.  The patient and family have been given the opportunity to ask questions and make suggestions.  Windell Moment, RN 02/06/2017, 9:14 PM

## 2017-02-06 NOTE — BHH Counselor (Signed)
Pt to be admitted to [facility] later today (after discharge).  Admitting is Starleen Arms, NP.  Attending is Dr. Dwyane Dee.   Diagnosis is MDD, Recurrent, Severe, w/o psychotic features

## 2017-02-06 NOTE — BHH Counselor (Signed)
Pt reassessed this morning.  Complained of continued suicidal ideation as well as auditory hallucination.  She requested inpatient treatment.  Consulted with Starleen Arms NP who recommended continued inpatient placement.
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
## 2017-02-06 NOTE — Progress Notes (Signed)
Lashana presented as a walk-in at [facility] at 2259 on 02/05/17, left without being seen, no reason given.  Concha Norway RN-BC, BSN A/C

## 2017-02-06 NOTE — ED Notes (Signed)
Pt stable, understands discharge instructions, and reasons for return.   

## 2017-02-06 NOTE — ED Notes (Signed)
Voluntary admission form faxed to [facility].

## 2017-02-06 NOTE — ED Notes (Signed)
[facility] contacted to tx patient to [facility]

## 2017-02-06 NOTE — ED Notes (Signed)
Patient was given a snack and drink, and A Regular was ordered for dinner.

## 2017-02-07 DIAGNOSIS — R45850 Homicidal ideations: Secondary | ICD-10-CM

## 2017-02-07 DIAGNOSIS — R7989 Other specified abnormal findings of blood chemistry: Secondary | ICD-10-CM | POA: Insufficient documentation

## 2017-02-07 DIAGNOSIS — Z9049 Acquired absence of other specified parts of digestive tract: Secondary | ICD-10-CM | POA: Insufficient documentation

## 2017-02-07 DIAGNOSIS — F339 Major depressive disorder, recurrent, unspecified: Secondary | ICD-10-CM

## 2017-02-07 DIAGNOSIS — R45851 Suicidal ideations: Secondary | ICD-10-CM

## 2017-02-07 DIAGNOSIS — R945 Abnormal results of liver function studies: Secondary | ICD-10-CM

## 2017-02-07 DIAGNOSIS — Z818 Family history of other mental and behavioral disorders: Secondary | ICD-10-CM

## 2017-02-07 DIAGNOSIS — E039 Hypothyroidism, unspecified: Secondary | ICD-10-CM

## 2017-02-07 LAB — CBC WITH DIFFERENTIAL/PLATELET
Basophils Absolute: 0 10*3/uL (ref 0.0–0.1)
Basophils Relative: 0 %
EOS PCT: 4 %
Eosinophils Absolute: 0.2 10*3/uL (ref 0.0–0.7)
HCT: 34.1 % — ABNORMAL LOW (ref 36.0–46.0)
HEMOGLOBIN: 10.4 g/dL — AB (ref 12.0–15.0)
LYMPHS PCT: 34 %
Lymphs Abs: 1.4 10*3/uL (ref 0.7–4.0)
MCH: 25.1 pg — ABNORMAL LOW (ref 26.0–34.0)
MCHC: 30.5 g/dL (ref 30.0–36.0)
MCV: 82.2 fL (ref 78.0–100.0)
MONOS PCT: 9 %
Monocytes Absolute: 0.4 10*3/uL (ref 0.1–1.0)
NEUTROS PCT: 53 %
Neutro Abs: 2.1 10*3/uL (ref 1.7–7.7)
Platelets: 55 10*3/uL — ABNORMAL LOW (ref 150–400)
RBC: 4.15 MIL/uL (ref 3.87–5.11)
RDW: 22 % — AB (ref 11.5–15.5)
WBC: 4.1 10*3/uL (ref 4.0–10.5)

## 2017-02-07 LAB — BASIC METABOLIC PANEL
ANION GAP: 9 (ref 5–15)
BUN: 10 mg/dL (ref 6–20)
CALCIUM: 9.8 mg/dL (ref 8.9–10.3)
CHLORIDE: 99 mmol/L — AB (ref 101–111)
CO2: 29 mmol/L (ref 22–32)
Creatinine, Ser: 0.7 mg/dL (ref 0.44–1.00)
GFR calc non Af Amer: >60 mL/min (ref >60)
Glucose, Bld: 101 mg/dL — ABNORMAL HIGH (ref 65–99)
POTASSIUM: 3.9 mmol/L (ref 3.5–5.1)
Sodium: 137 mmol/L (ref 135–145)

## 2017-02-07 LAB — MAGNESIUM: Magnesium: 1.7 mg/dL (ref 1.7–2.4)

## 2017-02-07 MED ORDER — NAPHAZOLINE-GLYCERIN 0.012-0.2 % OP SOLN
1.0000 [drp] | Freq: Four times a day (QID) | OPHTHALMIC | Status: DC | PRN
  Administered 2017-02-07 – 2017-02-10 (×2): 2 [drp] via OPHTHALMIC
  Filled 2017-02-07 (×2): qty 15

## 2017-02-07 MED ORDER — IBUPROFEN 800 MG PO TABS
800.0000 mg | ORAL_TABLET | Freq: Four times a day (QID) | ORAL | Status: DC | PRN
  Administered 2017-02-07 – 2017-02-13 (×14): 800 mg via ORAL
  Filled 2017-02-07 (×14): qty 1

## 2017-02-07 MED ORDER — ARIPIPRAZOLE 5 MG PO TABS
5.0000 mg | ORAL_TABLET | Freq: Every day | ORAL | Status: DC
  Administered 2017-02-07: 5 mg via ORAL
  Filled 2017-02-07 (×3): qty 1

## 2017-02-07 MED ORDER — BENZOCAINE 10 % MT GEL
Freq: Three times a day (TID) | OROMUCOSAL | Status: DC | PRN
  Administered 2017-02-07 – 2017-02-13 (×5): via OROMUCOSAL
  Filled 2017-02-07: qty 9.4

## 2017-02-07 MED ORDER — IBUPROFEN 600 MG PO TABS
ORAL_TABLET | ORAL | Status: AC
  Filled 2017-02-07: qty 1

## 2017-02-07 MED ORDER — IBUPROFEN 600 MG PO TABS
600.0000 mg | ORAL_TABLET | Freq: Four times a day (QID) | ORAL | Status: DC | PRN
  Administered 2017-02-07 (×3): 600 mg via ORAL
  Filled 2017-02-07 (×2): qty 1

## 2017-02-07 MED ORDER — ONDANSETRON 4 MG PO TBDP
4.0000 mg | ORAL_TABLET | Freq: Four times a day (QID) | ORAL | Status: AC | PRN
  Administered 2017-02-08 – 2017-02-10 (×3): 4 mg via ORAL
  Filled 2017-02-07 (×3): qty 1

## 2017-02-07 NOTE — BHH Group Notes (Signed)
[place] LCSW Group Therapy  02/07/2017 2:36 PM  Type of Therapy:  Group Therapy  Participation Level:  Did Not Attend-pt invited. Chose to rest in room.   Summary of Progress/Problems: Emotion Regulation: This group focused on both positive and negative emotion identification and allowed group members to process ways to identify feelings, regulate negative emotions, and find healthy ways to manage internal/external emotions. Group members were asked to reflect on a time when their reaction to an emotion led to a negative outcome and explored how alternative responses using emotion regulation would have benefited them. Group members were also asked to discuss a time when emotion regulation was utilized when a negative emotion was experienced.   Cailee Blanke N Smart LCSW 02/07/2017, 2:36 PM

## 2017-02-07 NOTE — Progress Notes (Signed)
Pt on unit but in room.  Pt is quiet and depressed.  Pt endorses passive SI but does verbally contract for safety.  Pt denies HI and AVH.  Pt wants access to doctor to fix her meds.  Pt denies pain or discomfort. Pt is med compliant. Pt offered support and encouragement. Pt remains safe on unit

## 2017-02-07 NOTE — Progress Notes (Signed)
D: Patient denies endorses SI but verbally contracts for safety, she denies HI and is currently denying AH. Patient has a depressed mood and anxious/paranoid affect.  She states that she did not sleep very well and appetite is fair at best. Per patients self inventory, her depression is 10/10 and is experiencing intense withdrawal symptoms.  Pt. Was very fixated on speaking with the doctor "to get my meds proper".    A: Patient given emotional support from RN. Patient encouraged to come to staff with concerns and/or questions. Patient's medication routine continued. Patient's orders and plan of care reviewed.   R: Patient remains appropriate and cooperative. Will continue to monitor patient q15 minutes for safety.
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
## 2017-02-07 NOTE — Progress Notes (Signed)
[place] Group Notes:  (Nursing/MHT/Case Management/Adjunct)  Date:  02/07/2017  Time:  0930 Type of Therapy:  Nurse Education  Participation Level:  Active  Participation Quality:  Redirectable  Affect:  Anxious  Cognitive:  Alert  Insight:  Lacking  Engagement in Group:  Engaged  Modes of Intervention:  Activity, Discussion, Education, Socialization and Support  Summary of Progress/Problems:The purpose of this group is to educate patients on the benefits and uses of aromatherapy. Pt attended group and made some statements that were not associated with the topic being discussed.  Mosie Lukes 02/07/2017, 5:03 PM
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
## 2017-02-07 NOTE — B[facility] Suicide Risk Assessment (Signed)
[facility] Admission Suicide Risk Assessment   Nursing information obtained from:  Patient Demographic factors:  Caucasian, Unemployed Current Mental Status:  Suicidal ideation indicated by patient Loss Factors:  Decline in physical health, Financial problems / change in socioeconomic status Historical Factors:  Prior suicide attempts, Impulsivity Risk Reduction Factors:  NA  Total Time spent with patient: 1 hour Principal Problem: Major depressive disorder, recurrent episode with melancholic features ([place]) Diagnosis:   Patient Active Problem List   Diagnosis Date Noted  . Hypothyroidism [E03.9] 06/02/2016    Priority: High  . Alcohol use disorder, severe, dependence ([place]) [F10.20] 04/07/2016    Priority: High  . Major depressive disorder, recurrent episode with melancholic features ([place]) [L93.7] 04/07/2016    Priority: High  . Elevated LFTs [R79.89] 02/07/2017  . H/O left hemicolectomy [Z90.49] 02/07/2017  . Sinus tachycardia [R00.0]   . Upper GI bleed [K92.2]   . Thrombocytopenia ([place]) [D69.6]   . History of hepatitis C [Z86.19]   . Alcohol abuse [F10.10]   . Mallory-Weiss tear [K22.6]    Subjective Data: See H&P  Continued Clinical Symptoms:  Alcohol Use Disorder Identification Test Final Score (AUDIT): 40 The "Alcohol Use Disorders Identification Test", Guidelines for Use in Primary Care, Second Edition.  World Pharmacologist [facility]). Score between 0-7:  no or low risk or alcohol related problems. Score between 8-15:  moderate risk of alcohol related problems. Score between 16-19:  high risk of alcohol related problems. Score 20 or above:  warrants further diagnostic evaluation for alcohol dependence and treatment.   CLINICAL FACTORS:   Severe Anxiety and/or Agitation Panic Attacks Depression:   Hopelessness Impulsivity Insomnia Severe Alcohol/Substance Abuse/Dependencies More than one psychiatric diagnosis Previous Psychiatric Diagnoses and Treatments Medical  Diagnoses and Treatments/Surgeries     Psychiatric Specialty Exam: Physical Exam  ROS  Blood pressure (!) 99/54, pulse 83, temperature 98.7 F (37.1 C), temperature source Oral, resp. rate 18, height 5\' 3"  (1.6 m), weight 73.5 kg (162 lb), last menstrual period 01/28/2017.Body mass index is 28.7 kg/m.    COGNITIVE FEATURES THAT CONTRIBUTE TO RISK:  Loss of executive function and Thought constriction (tunnel vision)    SUICIDE RISK:   Severe:  Frequent, intense, and enduring suicidal ideation, specific plan, no subjective intent, but some objective markers of intent (i.e., choice of lethal method), the method is accessible, some limited preparatory behavior, evidence of impaired self-control, severe dysphoria/symptomatology, multiple risk factors present, and few if any protective factors, particularly a lack of social support.  PLAN OF CARE: See H&P  I certify that inpatient services furnished can reasonably be expected to improve the patient's condition.   Hampton Abbot, MD 02/07/2017, 3:54 PM

## 2017-02-08 DIAGNOSIS — R000 Tachycardia, unspecified: Secondary | ICD-10-CM

## 2017-02-08 DIAGNOSIS — K922 Gastrointestinal hemorrhage, unspecified: Secondary | ICD-10-CM

## 2017-02-08 DIAGNOSIS — K226 Gastro-esophageal laceration-hemorrhage syndrome: Secondary | ICD-10-CM

## 2017-02-08 DIAGNOSIS — F1020 Alcohol dependence, uncomplicated: Secondary | ICD-10-CM

## 2017-02-08 DIAGNOSIS — Z8619 Personal history of other infectious and parasitic diseases: Secondary | ICD-10-CM

## 2017-02-08 DIAGNOSIS — D696 Thrombocytopenia, unspecified: Secondary | ICD-10-CM

## 2017-02-08 MED ORDER — GABAPENTIN 600 MG PO TABS
300.0000 mg | ORAL_TABLET | Freq: Three times a day (TID) | ORAL | Status: DC
  Filled 2017-02-08 (×3): qty 0.5

## 2017-02-08 MED ORDER — ACETAMINOPHEN 500 MG PO TABS
500.0000 mg | ORAL_TABLET | Freq: Three times a day (TID) | ORAL | Status: DC | PRN
  Administered 2017-02-08 – 2017-02-13 (×11): 500 mg via ORAL
  Filled 2017-02-08 (×11): qty 1

## 2017-02-08 MED ORDER — ARIPIPRAZOLE 10 MG PO TABS
10.0000 mg | ORAL_TABLET | Freq: Every day | ORAL | Status: DC
  Administered 2017-02-08 – 2017-02-12 (×5): 10 mg via ORAL
  Filled 2017-02-08 (×6): qty 1

## 2017-02-08 MED ORDER — GABAPENTIN 300 MG PO CAPS
300.0000 mg | ORAL_CAPSULE | Freq: Three times a day (TID) | ORAL | Status: DC
  Administered 2017-02-08 – 2017-02-13 (×15): 300 mg via ORAL
  Filled 2017-02-08 (×19): qty 1

## 2017-02-08 MED ORDER — CARBAMAZEPINE 200 MG PO TABS
200.0000 mg | ORAL_TABLET | Freq: Four times a day (QID) | ORAL | Status: DC
  Administered 2017-02-08 – 2017-02-13 (×20): 200 mg via ORAL
  Filled 2017-02-08 (×25): qty 1

## 2017-02-08 MED ORDER — CEPHALEXIN 500 MG PO CAPS
500.0000 mg | ORAL_CAPSULE | Freq: Three times a day (TID) | ORAL | Status: DC
  Administered 2017-02-08 – 2017-02-13 (×15): 500 mg via ORAL
  Filled 2017-02-08 (×4): qty 1
  Filled 2017-02-08: qty 2
  Filled 2017-02-08 (×14): qty 1

## 2017-02-08 NOTE — Progress Notes (Signed)
[facility] referral made 02/08/2017 8:55 AM. Venetia Constable authorization #: [id number] from 02/08/17-02/14/17.  Maxie Better, MSW, LCSW Clinical Social Worker 02/08/2017 8:56 AM

## 2017-02-08 NOTE — B[facility] Group Notes (Signed)
[place] LCSW Group Therapy 02/08/2017 1:15pm  Type of Therapy: Group Therapy- Feelings Around Relapse and Recovery  Pt did not attend, declined invitation.   Adriana Reams, LCSW [phone] 02/08/2017 3:30 PM

## 2017-02-08 NOTE — Progress Notes (Signed)
Recreation Therapy Notes  Date:  02/08/17 Time: 0930 Location: [address] Dayroom  Group Topic: Stress Management  Goal Area(s) Addresses:  Patient will verbalize importance of using healthy stress management.  Patient will identify positive emotions associated with healthy stress management.   Intervention: Stress Management  Activity :  Guided Imagery.  LRT introduced the stress management technique of guided imagery.  LRT read a script to allow patients to engage in the activity.  Patients were to follow along with as LRT read the script to participate in the activity.  Education:  Stress Management, Discharge Planning.   Education Outcome: Acknowledges edcuation/In group clarification offered/Needs additional education  Clinical Observations/Feedback: Pt did not attend group.   Victorino Sparrow, LRT/CTRS         Victorino Sparrow A 02/08/2017 11:32 AM
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
## 2017-02-08 NOTE — Progress Notes (Signed)
D:Pt has been requesting pain medications for the top of her mouth. Pt asked writer for a safety pin to "burst an abscess" in her mouth. A:Instructed pt about safety and infection prevention. MD evaluated and started an antibiotic. Offered support and redirection. Gave medications as ordered. R:Pt contracts for safety. Safety maintained on the unit.

## 2017-02-08 NOTE — Progress Notes (Signed)
Pt is up and agitated. Pt sts she has mouth pain and withdrawal symptoms.  Pt sts she has large abscess in her upper gums as well as a very visible decay of the canine.  Pt describes intense pain and ache.  Pt details and is visible, sweats, tremors, anger and anxiety.  Pt also sts she has a headache and stomach pain. PRN medications given.  Pt advised to see a free clinic dentist as soon as possible.   Pt returns to bed and is going back to sleep.  Pt remains safe on unit.

## 2017-02-08 NOTE — Progress Notes (Signed)
CSW left message for Amy in admissions at [facility] to check status of referral. [place], MSW, LCSW Clinical Social Worker 02/08/2017 2:03 PM

## 2017-02-09 DIAGNOSIS — G4700 Insomnia, unspecified: Secondary | ICD-10-CM

## 2017-02-09 DIAGNOSIS — F17210 Nicotine dependence, cigarettes, uncomplicated: Secondary | ICD-10-CM

## 2017-02-09 DIAGNOSIS — K1379 Other lesions of oral mucosa: Secondary | ICD-10-CM

## 2017-02-09 DIAGNOSIS — F10239 Alcohol dependence with withdrawal, unspecified: Secondary | ICD-10-CM

## 2017-02-09 DIAGNOSIS — G8929 Other chronic pain: Secondary | ICD-10-CM

## 2017-02-09 DIAGNOSIS — F419 Anxiety disorder, unspecified: Secondary | ICD-10-CM

## 2017-02-09 MED ORDER — TRAZODONE HCL 100 MG PO TABS
100.0000 mg | ORAL_TABLET | Freq: Every evening | ORAL | Status: DC | PRN
  Administered 2017-02-10 – 2017-02-12 (×3): 100 mg via ORAL
  Filled 2017-02-09 (×4): qty 1

## 2017-02-09 NOTE — Progress Notes (Signed)
D.  Pt anxious on approach, reports poor sleep and continued anxiety.  Pt observed interacting appropriately with peers on the unit.  Pt denies SI/HI but does report auditory hallucinations "all the time".  A.  Support and encouragement offered, medication given as ordered.  R.  Pt remains safe on the unit, will continue to monitor.

## 2017-02-09 NOTE — Progress Notes (Signed)
Pt.attended last evening's A.A.meeting but left early and returned to her bedroom.

## 2017-02-09 NOTE — Progress Notes (Signed)
D: Pt visible in milieu at intervals during shift. A & O X 3 when assessed. Denied SI, HI and VH. Endorsed + AH "I hear voices all the time telling me not to do stuff". Demanding and needy with multiple medication requests (PRN and scheduled).  A: Scheduled and PRN (see emar) medications administered as prescribed and effects monitored. Support and availability provided to pt. Writer encouraged pt to voice concerns and comply with current treatment regimen including groups. Medication dosing schedule discussed with pt but with no effect. Q 15 minutes safety checks maintained without issues. R: Pt remains medication compliant. Denies adverse drug reactions when assessed. Attended scheduled unit groups. Went off unit for activities and meals with peers, returned without issues. POC effective for safety and mood stability. Pt denies concerns at this time.

## 2017-02-09 NOTE — [facility] Group Notes (Signed)
[facility] LCSW Group Therapy Note  02/09/2017  and  10:00 AM  Type of Therapy and Topic:  Group Therapy: Avoiding Self-Sabotaging and Enabling Behaviors  Participation Level:  Did Not Attend     Sheilah Pigeon, LCSW

## 2017-02-09 NOTE — BHH Counselor (Signed)
Pt adament on speaking with CSW Saturday following group although she did not attend group. Patient states that she NO LONGER wants to go to [place] where she has been referred and was insistent on [facility] or [facility] referral. Patient provided education that she is not as candidate for [facility] due to county of residence;  signed ROI for [employer].  Sheilah Pigeon, LCSW

## 2017-02-09 NOTE — Progress Notes (Signed)
Patient did attend the evening speaker AA meeting.  

## 2017-02-09 NOTE — Progress Notes (Signed)
[facility] MD Progress Note  02/09/2017 4:22 PM Dawn Foley  MRN:  [id number]   Subjective: Patient reports  " I am not sleeping well throughout the night." - - patient was encouraged to stay awake during the day and attend group sessions.  Objective:  Dawn Foley is awake, alert and oriented * 3. Seen resting in bed.  Denies suicidal or homicidal ideation during this assessment. However reports hearing voice or racing thoughts states she is unable to tell if the voices are inside of her head. Denies auditory or visual hallucination and does not appear to be responding to internal stimuli. Patient reports " I am too tired to move." states she is having a hard time detox ing  Patient reports she is medication compliant without mediation side effects.  States her depression 8/10. Patient reports fair appetite. States she is not resting well throughout the night . Support, encouragement and reassurance was provided.    Principal Problem: Major depressive disorder, recurrent episode with melancholic features ([place]) Diagnosis:   Patient Active Problem List   Diagnosis Date Noted  . Elevated LFTs [R79.89] 02/07/2017  . H/O left hemicolectomy [Z90.49] 02/07/2017  . Sinus tachycardia [R00.0]   . Upper GI bleed [K92.2]   . Thrombocytopenia ([place]) [D69.6]   . History of hepatitis C [Z86.19]   . Alcohol abuse [F10.10]   . Mallory-Weiss tear [K22.6]   . Hypothyroidism [E03.9] 06/02/2016  . Alcohol use disorder, severe, dependence ([place]) [F10.20] 04/07/2016  . Major depressive disorder, recurrent episode with melancholic features ([place]) [M19.6] 04/07/2016   Total Time spent with patient: 30 minutes  Past Psychiatric History: Multiple previous psych admissions for detox treatment and also for [facility] placement for rehab.   Past Medical History:  Past Medical History:  Diagnosis Date  . Alcoholism ([place])   . Depression   . Hepatitis     Past Surgical History:  Procedure Laterality Date  .  ESOPHAGOGASTRODUODENOSCOPY (EGD) WITH PROPOFOL N/A 09/04/2016   Procedure: ESOPHAGOGASTRODUODENOSCOPY (EGD) WITH PROPOFOL;  Surgeon: Doran Stabler, MD;  Location: [place];  Service: Endoscopy;  Laterality: N/A;  . NO PAST SURGERIES     Family History:  Family History  Problem Relation Age of Onset  . Mental illness Other    Family Psychiatric  History: significant for substance abuse.  Social History:  History  Alcohol Use  . Yes    Comment: heavy     History  Drug Use No    Social History   Social History  . Marital status: Single    Spouse name: N/A  . Number of children: N/A  . Years of education: N/A   Social History Main Topics  . Smoking status: Current Every Day Smoker    Packs/day: 1.00    Types: Cigarettes  . Smokeless tobacco: Never Used  . Alcohol use Yes     Comment: heavy  . Drug use: No  . Sexual activity: Yes    Birth control/ protection: Condom   Other Topics Concern  . None   Social History Narrative  . None   Additional Social History:    Pain Medications: pt denies Prescriptions: SEE MAR Over the Counter: pt denies History of alcohol / drug use?: Yes Longest period of sobriety (when/how long): 3 YEARS Negative Consequences of Use: Financial Withdrawal Symptoms: Diarrhea, Tremors, Nausea / Vomiting, Agitation, Seizures Onset of Seizures: Detox seizures Date of most recent seizure: yesterday Name of Substance 1: ALCOHOL 1 - Age of First Use:  13 1 - Amount (size/oz): 1/2 GALLON/WHISKEY 1 - Frequency: DAILY 1 - Duration: ONGOING 1 - Last Use / Amount: 02/04/17 Name of Substance 2: BENZODIAZEPINES- VALIUM, XANAX  2 - Age of First Use: UNK 2 - Amount (size/oz): UNK 2 - Frequency: UNK 2 - Duration: UNK 2 - Last Use / Amount: STS NOT TAKING NOW BY HX OF DEPENDENCE PER HX Name of Substance 3: "PAIN PILLS" (PT DID NOT KNOW NAME) - NOT RX PER PT ([facility] ED NOTES STS PT WAS ASKING FOR VALIUM) 3 - Age of First Use: UNK 3 - Amount  (size/oz): UNK 3 - Frequency: "THE OTHER DAY" "EVERY NOW AND THEN" 3 - Duration: ONGOING 3 - Last Use / Amount: SEVERAL DAYS AGO Name of Substance 4: NICOTINE/CIGARETTES 4 - Age of First Use: UNK 4 - Amount (size/oz): 20  4 - Frequency: daily 4 - Duration: ongoing 4 - Last Use / Amount: 02/05/17            Sleep: Poor  Appetite:  Poor  Current Medications: Current Facility-Administered Medications  Medication Dose Route Frequency Provider Last Rate Last Dose  . acetaminophen (TYLENOL) tablet 500 mg  500 mg Oral TID PRN Lindell Spar I, NP   500 mg at 02/09/17 1137  . alum & mag hydroxide-simeth (MAALOX/MYLANTA) 200-200-20 MG/5ML suspension 30 mL  30 mL Oral Q4H PRN Ethelene Hal, NP      . ARIPiprazole (ABILIFY) tablet 10 mg  10 mg Oral QHS Ambrose Finland, MD   10 mg at 02/08/17 2200  . benzocaine (ORAJEL) 10 % mucosal gel   Mouth/Throat TID PRN Lindell Spar I, NP      . carbamazepine (TEGRETOL) tablet 200 mg  200 mg Oral QID Lindell Spar I, NP   200 mg at 02/09/17 1136  . cephALEXin (KEFLEX) capsule 500 mg  500 mg Oral Q8H Nwoko, Agnes I, NP   500 mg at 02/09/17 1516  . gabapentin (NEURONTIN) capsule 300 mg  300 mg Oral TID Cobos, Myer Peer, MD   300 mg at 02/09/17 1516  . hydrOXYzine (ATARAX/VISTARIL) tablet 25 mg  25 mg Oral Q6H PRN Ethelene Hal, NP   25 mg at 02/08/17 0425  . ibuprofen (ADVIL,MOTRIN) tablet 800 mg  800 mg Oral Q6H PRN Hampton Abbot, MD   800 mg at 02/09/17 0816  . levothyroxine (SYNTHROID, LEVOTHROID) tablet 75 mcg  75 mcg Oral QAC breakfast Ethelene Hal, NP   75 mcg at 02/09/17 [phone]  . loperamide (IMODIUM) capsule 2-4 mg  2-4 mg Oral PRN Ethelene Hal, NP      . LORazepam (ATIVAN) tablet 1 mg  1 mg Oral Q6H PRN Laverle Hobby, PA-C   1 mg at 02/08/17 2242  . LORazepam (ATIVAN) tablet 1 mg  1 mg Oral BID Patriciaann Clan E, PA-C       Followed by  . [START ON 02/11/2017] LORazepam (ATIVAN) tablet 1 mg  1 mg Oral Daily  Simon, Spencer E, PA-C      . magnesium hydroxide (MILK OF MAGNESIA) suspension 30 mL  30 mL Oral Daily PRN Ethelene Hal, NP      . multivitamin with minerals tablet 1 tablet  1 tablet Oral Daily Laverle Hobby, PA-C   1 tablet at 02/09/17 0809  . naphazoline-glycerin (CLEAR EYES) ophth solution 1-2 drop  1-2 drop Both Eyes QID PRN Lindell Spar I, NP   2 drop at 02/07/17 1222  . nicotine (NICODERM CQ -  dosed in mg/24 hours) patch 21 mg  21 mg Transdermal Daily Ethelene Hal, NP   21 mg at 02/09/17 0277  . ondansetron (ZOFRAN-ODT) disintegrating tablet 4 mg  4 mg Oral Q6H PRN Ethelene Hal, NP   4 mg at 02/08/17 2201  . thiamine (VITAMIN B-1) tablet 100 mg  100 mg Oral Daily Laverle Hobby, PA-C   100 mg at 02/09/17 4128  . traZODone (DESYREL) tablet 100 mg  100 mg Oral QHS PRN Derrill Center, NP        Lab Results:  No results found for this or any previous visit (from the past 74 hour(s)).  Blood Alcohol level:  Lab Results  Component Value Date   ETH 173 (H) 02/05/2017   ETH <5 78/67/6720    Metabolic Disorder Labs: Lab Results  Component Value Date   HGBA1C 4.8 06/02/2016   MPG 91 06/02/2016   No results found for: PROLACTIN Lab Results  Component Value Date   CHOL 223 (H) 06/02/2016   TRIG 62 06/02/2016   HDL 134 06/02/2016   CHOLHDL 1.7 06/02/2016   VLDL 12 06/02/2016   LDLCALC 77 06/02/2016    Physical Findings: AIMS: Facial and Oral Movements Muscles of Facial Expression: None, normal Lips and Perioral Area: None, normal Jaw: None, normal Tongue: None, normal,Extremity Movements Upper (arms, wrists, hands, fingers): None, normal Lower (legs, knees, ankles, toes): None, normal, Trunk Movements Neck, shoulders, hips: None, normal, Overall Severity Severity of abnormal movements (highest score from questions above): None, normal Incapacitation due to abnormal movements: None, normal Patient's awareness of abnormal movements (rate only  patient's report): No Awareness, Dental Status Current problems with teeth and/or dentures?: No Does patient usually wear dentures?: No  CIWA:  CIWA-Ar Total: 5 COWS:     Musculoskeletal: Strength & Muscle Tone: within normal limits Gait & Station: normal Patient leans: N/A  Psychiatric Specialty Exam: Physical Exam  Vitals reviewed. Constitutional: She is oriented to person, place, and time.  Neurological: She is alert and oriented to person, place, and time.  Psychiatric: She has a normal mood and affect. Her behavior is normal.    Review of Systems  Musculoskeletal: Positive for myalgias.  Psychiatric/Behavioral: Positive for depression. The patient is nervous/anxious.     Blood pressure 112/75, pulse (!) 110, temperature 99 F (37.2 C), temperature source Oral, resp. rate 18, height 5\' 3"  (1.6 m), weight 73.5 kg (162 lb), last menstrual period 01/28/2017.Body mass index is 28.7 kg/m.  General Appearance: Disheveled and Guarded  Eye Contact:  Good  Speech:  Clear and Coherent and Pressured  Volume:  Decreased  Mood:  Anxious and Depressed  Affect:  Depressed and Tearful  Thought Process:  Coherent and Goal Directed  Orientation:  Full (Time, Place, and Person)  Thought Content:  Rumination  Suicidal Thoughts:  Yes.  with intent/plan  Homicidal Thoughts:  No  Memory:  Immediate;   Good Recent;   Fair Remote;   Fair  Judgement:  Impaired  Insight:  Fair  Psychomotor Activity:  Restlessness  Concentration:  Concentration: Fair and Attention Span: Fair  Recall:  [employer] of Knowledge:  Good  Language:  Good  Akathisia:  Negative  Handed:  Right  AIMS (if indicated):     Assets:  Desire for Improvement Financial Resources/Insurance Resilience Social Support  ADL's:  Intact  Cognition:  WNL  Sleep:  Number of Hours: 5.5      I agree with current treatment plan on  02/09/2017, Patient seen face-to-face for psychiatric evaluation follow-up, chart reviewed and  case discussed. Reviewed the information documented and agree with the treatment plan.   Treatment Plan Summary: Daily contact with patient to assess and evaluate symptoms and progress in treatment and Medication management   1. Will maintain Q 15 minutes observation for safety. Estimated LOS: 5-7 days 2. Patient will participate in group, milieu, and family therapy. Psychotherapy: Social and Airline pilot, anti-bullying, learning based strategies, cognitive behavioral, and family object relations individuation separation intervention psychotherapies can be considered. Review of labs indicated mild increase of cholesterol - needs follow up 3. Alcohol withdrawal: Continue ativan detox protocol and CIWA monitoring 4. Depression: not improving continue Abilify 10 mg daily at bed time for depression 5. Mouth pain: Orajel TID/PRN 6. Anxiety: Gabapentin 300 mg 3 times daily and Hydroxyzine 25 mg Q6H/PRN 7. Pain: continue Ibuprofen 800 mg Q6H PRN 8. Hypothyroidism: continue synthroid 75 mcg daily before breakfast.  9. Tobacco dependence: Nicotine patch 21 mg transdermal daily 10. Insomnia:  Eduction was provided with sleeping hygiene. Patient was encouraged  to stay wake during the day and attended group sessions. Increased Trazodone 50 mg to 100mg  daily at bed time.  11. Will continue to monitor patient's mood and behavior. 12. Social Work will schedule a Family meeting to obtain collateral information and discuss discharge and follow up plan. Discharge concerns will also be addressed: Safety, stabilization, and access to medication  Derrill Center, NP 02/09/2017, 4:22 PM

## 2017-02-09 NOTE — Progress Notes (Signed)
Writer spoke with patient 1:1 and she c/o upper mouth pain and request pain medication. She as requested several prns and will ask when she can receive her ativan and other medications again. Writer informed her of the times for prn medications and she received her scheduled medications. She has been up in the dayroom periodically interacting with peers. Support given and safety maintained on unit with 15 min checks.

## 2017-02-09 NOTE — [facility] Group Notes (Signed)
[facility] Group Notes:  (Nursing/MHT/Case Management/Adjunct)  Date:  02/09/2017  Time:  3:01 PM  Type of Therapy:  Nurse Education  Participation Level:  Did Not Attend  Summary of Progress/Problems:  This was a healthy coping skills development group.   Cheri Kearns 02/09/2017, 3:01 PM

## 2017-02-10 MED ORDER — ONDANSETRON 8 MG PO TBDP
8.0000 mg | ORAL_TABLET | Freq: Once | ORAL | Status: DC
  Filled 2017-02-10: qty 1

## 2017-02-10 MED ORDER — ACAMPROSATE CALCIUM 333 MG PO TBEC
333.0000 mg | DELAYED_RELEASE_TABLET | Freq: Three times a day (TID) | ORAL | Status: DC
  Administered 2017-02-10 – 2017-02-11 (×5): 333 mg via ORAL
  Filled 2017-02-10 (×11): qty 1

## 2017-02-10 MED ORDER — HYDROXYZINE HCL 50 MG PO TABS
50.0000 mg | ORAL_TABLET | Freq: Once | ORAL | Status: AC | PRN
  Administered 2017-02-10: 50 mg via ORAL
  Filled 2017-02-10: qty 1

## 2017-02-10 NOTE — Progress Notes (Signed)
D.  Pt anxious on approach, complaint of vomiting all day.  Pt states that every time she eats she becomes ill.  Pt had requested medication earlier but was unable to take it due to allergy.  NP contacted and new order received.  Pt did feel well enough to attend group, minimal interaction with peers this shift.  Pt was able to eat sandwich after nausea medication given, complaint of headache.  Pt does endorse passive SI but contracts for safety on the unit.  Pt denies HI/AVH at this time.  A.  Support and encouragement offered, medication given as ordered.  R.  Pt remain safe on the unit, will continue to monitor.

## 2017-02-10 NOTE — Progress Notes (Signed)
Patient ID: Dawn Foley, female   DOB: May 08, 1986, 31 y.o.   MRN: [id number]  DAR: Pt. Denies SI/HI and A/V Hallucinations. She reports sleep is poor, appetite is good, energy level is low, and concentration is good. She rates depression 7/10, hopelessness 9/10, and anxiety 10/10. Patient does report pain in her tooth and has been receiving PRN medications. She also endorses several withdrawal symptoms including diarrhea, chilling, tremors, sedation, cravings, agitation, nausea, irritability, runny nose, and cramping. PRN medications have been administered throughout the day. Support and encouragement provided to the patient. Scheduled and PRN medications administered to patient per physician's orders. Patient is seen in the milieu intermittently interacting with peers, mainly her roommate. She came to the nurses station this evening stating, "I don't feel right miss [name]." Writer received Vital signs, patient's pulse was slightly elevated. Patient reports she has been "throwing up all my food." Writer has not been a witness to any of these episodes of vomitus. Writer spoke with NP Ludger Nutting, see MAR for new orders. Q15 minute checks are maintained for safety.

## 2017-02-10 NOTE — Progress Notes (Signed)
Patient did attend the evening speaker AA meeting.  

## 2017-02-10 NOTE — [facility] Group Notes (Signed)
[facility] Group Notes:  Healthy Coping Skills Date:  02/10/2017  Time:  4:15 PM  Type of Therapy:  Psychoeducational Skills  Participation Level:  Minimal  Participation Quality:  Resistant  Affect:  Anxious  Cognitive:  Appropriate  Insight:  Limited  Engagement in Group:  Limited  Modes of Intervention:  Discussion and Education  Summary of Progress/Problems: Patient attended group and was limited in engagement.   Gaylan Gerold E 02/10/2017, 4:15 PM

## 2017-02-10 NOTE — Progress Notes (Signed)
[facility] MD Progress Note  02/10/2017 7:56 AM Dawn Foley  MRN:  [id number]   Subjective: Patient reports " I am having strong alcohol cravings."   Objective:  Dawn Foley is awake, alert and oriented * 3. Reports present with concerns of staying awake most of the night while taking trazodone. States she can't help sleeping throughout the day. States she will try to stay wake most of the day today.  Continue to denies suicidal or homicidal ideation during this assessment. Reports auditory hallucination that are constant. Reports she was prescribed haldol and cogentin  in the past. Reports taken medications as prdx without missing dose reports tolerating medications well.. Support, encouragement and reassurance was provided.    Principal Problem: Major depressive disorder, recurrent episode with melancholic features ([place]) Diagnosis:   Patient Active Problem List   Diagnosis Date Noted  . Elevated LFTs [R79.89] 02/07/2017  . H/O left hemicolectomy [Z90.49] 02/07/2017  . Sinus tachycardia [R00.0]   . Upper GI bleed [K92.2]   . Thrombocytopenia ([place]) [D69.6]   . History of hepatitis C [Z86.19]   . Alcohol abuse [F10.10]   . Mallory-Weiss tear [K22.6]   . Hypothyroidism [E03.9] 06/02/2016  . Alcohol use disorder, severe, dependence ([place]) [F10.20] 04/07/2016  . Major depressive disorder, recurrent episode with melancholic features ([place]) [Y86.5] 04/07/2016   Total Time spent with patient: 30 minutes  Past Psychiatric History: Multiple previous psych admissions for detox treatment and also for [facility] placement for rehab.   Past Medical History:  Past Medical History:  Diagnosis Date  . Alcoholism ([place])   . Depression   . Hepatitis     Past Surgical History:  Procedure Laterality Date  . ESOPHAGOGASTRODUODENOSCOPY (EGD) WITH PROPOFOL N/A 09/04/2016   Procedure: ESOPHAGOGASTRODUODENOSCOPY (EGD) WITH PROPOFOL;  Surgeon: Doran Stabler, MD;  Location: [place];  Service:  Endoscopy;  Laterality: N/A;  . NO PAST SURGERIES     Family History:  Family History  Problem Relation Age of Onset  . Mental illness Other    Family Psychiatric  History: significant for substance abuse.  Social History:  History  Alcohol Use  . Yes    Comment: heavy     History  Drug Use No    Social History   Social History  . Marital status: Single    Spouse name: N/A  . Number of children: N/A  . Years of education: N/A   Social History Main Topics  . Smoking status: Current Every Day Smoker    Packs/day: 1.00    Types: Cigarettes  . Smokeless tobacco: Never Used  . Alcohol use Yes     Comment: heavy  . Drug use: No  . Sexual activity: Yes    Birth control/ protection: Condom   Other Topics Concern  . None   Social History Narrative  . None   Additional Social History:    Pain Medications: pt denies Prescriptions: SEE [place] Over the Counter: pt denies History of alcohol / drug use?: Yes Longest period of sobriety (when/how long): 3 YEARS Negative Consequences of Use: Financial Withdrawal Symptoms: Diarrhea, Tremors, Nausea / Vomiting, Agitation, Seizures Onset of Seizures: Detox seizures Date of most recent seizure: yesterday Name of Substance 1: ALCOHOL 1 - Age of First Use: 13 1 - Amount (size/oz): 1/2 GALLON/WHISKEY 1 - Frequency: DAILY 1 - Duration: ONGOING 1 - Last Use / Amount: 02/04/17 Name of Substance 2: BENZODIAZEPINES- VALIUM, XANAX  2 - Age of First Use: UNK 2 - Amount (  size/oz): UNK 2 - Frequency: UNK 2 - Duration: UNK 2 - Last Use / Amount: STS NOT TAKING NOW BY HX OF DEPENDENCE PER HX Name of Substance 3: "PAIN PILLS" (PT DID NOT KNOW NAME) - NOT RX PER PT ([facility] ED NOTES STS PT WAS ASKING FOR VALIUM) 3 - Age of First Use: UNK 3 - Amount (size/oz): UNK 3 - Frequency: "THE OTHER DAY" "EVERY NOW AND THEN" 3 - Duration: ONGOING 3 - Last Use / Amount: SEVERAL DAYS AGO Name of Substance 4: NICOTINE/CIGARETTES 4 - Age of First Use:  UNK 4 - Amount (size/oz): 20  4 - Frequency: daily 4 - Duration: ongoing 4 - Last Use / Amount: 02/05/17            Sleep: Poor  Appetite:  Poor  Current Medications: Current Facility-Administered Medications  Medication Dose Route Frequency Provider Last Rate Last Dose  . acamprosate (CAMPRAL) tablet 333 mg  333 mg Oral TID Derrill Center, NP      . acetaminophen (TYLENOL) tablet 500 mg  500 mg Oral TID PRN Lindell Spar I, NP   500 mg at 02/10/17 0102  . alum & mag hydroxide-simeth (MAALOX/MYLANTA) 200-200-20 MG/5ML suspension 30 mL  30 mL Oral Q4H PRN Ethelene Hal, NP      . ARIPiprazole (ABILIFY) tablet 10 mg  10 mg Oral QHS Ambrose Finland, MD   10 mg at 02/09/17 2102  . benzocaine (ORAJEL) 10 % mucosal gel   Mouth/Throat TID PRN Lindell Spar I, NP      . carbamazepine (TEGRETOL) tablet 200 mg  200 mg Oral QID Lindell Spar I, NP   200 mg at 02/09/17 2102  . cephALEXin (KEFLEX) capsule 500 mg  500 mg Oral Q8H Nwoko, Agnes I, NP   500 mg at 02/10/17 0612  . gabapentin (NEURONTIN) capsule 300 mg  300 mg Oral TID Cobos, Myer Peer, MD   300 mg at 02/09/17 1656  . hydrOXYzine (ATARAX/VISTARIL) tablet 25 mg  25 mg Oral Q6H PRN Ethelene Hal, NP   25 mg at 02/10/17 0102  . ibuprofen (ADVIL,MOTRIN) tablet 800 mg  800 mg Oral Q6H PRN Hampton Abbot, MD   800 mg at 02/09/17 2039  . levothyroxine (SYNTHROID, LEVOTHROID) tablet 75 mcg  75 mcg Oral QAC breakfast Ethelene Hal, NP   75 mcg at 02/10/17 8242  . LORazepam (ATIVAN) tablet 1 mg  1 mg Oral BID Laverle Hobby, PA-C   1 mg at 02/09/17 1657   Followed by  . [START ON 02/11/2017] LORazepam (ATIVAN) tablet 1 mg  1 mg Oral Daily Simon, Spencer E, PA-C      . magnesium hydroxide (MILK OF MAGNESIA) suspension 30 mL  30 mL Oral Daily PRN Ethelene Hal, NP      . multivitamin with minerals tablet 1 tablet  1 tablet Oral Daily Laverle Hobby, PA-C   1 tablet at 02/09/17 0809  . naphazoline-glycerin  (CLEAR EYES) ophth solution 1-2 drop  1-2 drop Both Eyes QID PRN Lindell Spar I, NP   2 drop at 02/07/17 1222  . nicotine (NICODERM CQ - dosed in mg/24 hours) patch 21 mg  21 mg Transdermal Daily Ethelene Hal, NP   21 mg at 02/09/17 3536  . ondansetron (ZOFRAN-ODT) disintegrating tablet 4 mg  4 mg Oral Q6H PRN Ethelene Hal, NP   4 mg at 02/08/17 2201  . thiamine (VITAMIN B-1) tablet 100 mg  100 mg Oral Daily  Patriciaann Clan E, PA-C   100 mg at 02/09/17 3614  . traZODone (DESYREL) tablet 100 mg  100 mg Oral QHS PRN Derrill Center, NP        Lab Results:  No results found for this or any previous visit (from the past 39 hour(s)).  Blood Alcohol level:  Lab Results  Component Value Date   ETH 173 (H) 02/05/2017   ETH <5 43/15/4008    Metabolic Disorder Labs: Lab Results  Component Value Date   HGBA1C 4.8 06/02/2016   MPG 91 06/02/2016   No results found for: PROLACTIN Lab Results  Component Value Date   CHOL 223 (H) 06/02/2016   TRIG 62 06/02/2016   HDL 134 06/02/2016   CHOLHDL 1.7 06/02/2016   VLDL 12 06/02/2016   LDLCALC 77 06/02/2016    Physical Findings: AIMS: Facial and Oral Movements Muscles of Facial Expression: None, normal Lips and Perioral Area: None, normal Jaw: None, normal Tongue: None, normal,Extremity Movements Upper (arms, wrists, hands, fingers): None, normal Lower (legs, knees, ankles, toes): None, normal, Trunk Movements Neck, shoulders, hips: None, normal, Overall Severity Severity of abnormal movements (highest score from questions above): None, normal Incapacitation due to abnormal movements: None, normal Patient's awareness of abnormal movements (rate only patient's report): No Awareness, Dental Status Current problems with teeth and/or dentures?: No Does patient usually wear dentures?: No  CIWA:  CIWA-Ar Total: 8 COWS:     Musculoskeletal: Strength & Muscle Tone: within normal limits Gait & Station: normal Patient leans:  N/A  Psychiatric Specialty Exam: Physical Exam  Vitals reviewed. Constitutional: She is oriented to person, place, and time. She appears well-developed.  Cardiovascular: Normal rate.   Neurological: She is alert and oriented to person, place, and time.  Psychiatric: She has a normal mood and affect. Her behavior is normal.    Review of Systems  Musculoskeletal: Positive for myalgias.  Psychiatric/Behavioral: Positive for depression. The patient is nervous/anxious.     Blood pressure 125/80, pulse 100, temperature 99 F (37.2 C), temperature source Oral, resp. rate 18, height 5\' 3"  (1.6 m), weight 73.5 kg (162 lb), last menstrual period 01/28/2017.Body mass index is 28.7 kg/m.  General Appearance: Casual paper scrubs  Eye Contact:  Good  Speech:  Clear and Coherent and Pressured  Volume:  Decreased  Mood:  Anxious and Depressed  Affect:  Appropriate  Thought Process:  Coherent  Orientation:  Full (Time, Place, and Person)  Thought Content:  Hallucinations: Auditory and Rumination  Suicidal Thoughts:  Yes.  with intent/plan  Homicidal Thoughts:  No  Memory:  Immediate;   Fair Recent;   Fair Remote;   Fair  Judgement:  Impaired  Insight:  Fair  Psychomotor Activity:  Restlessness- improving   Concentration:  Concentration: Fair and Attention Span: Fair  Recall:  [employer] of Knowledge:  Good  Language:  Good  Akathisia:  Negative  Handed:  Right  AIMS (if indicated):     Assets:  Desire for Improvement Financial Resources/Insurance Resilience Social Support  ADL's:  Intact  Cognition:  WNL  Sleep:  Number of Hours: 4.75    I agree with current treatment plan on 02/10/2017, Patient seen face-to-face for psychiatric evaluation follow-up, chart reviewed and case discussed. Reviewed the information documented and agree with the treatment plan.  Treatment Plan Summary: Daily contact with patient to assess and evaluate symptoms and progress in treatment and Medication  management   Continue with current treatment plan on 02/10/2017 except where noted  Start campra; 333mg  PO TID for EtoH cravings  Continue with Abilify 10 mg, Neurontin 300 mg   for mood stabilization. Continue with Trazodone 100 mg PRN  for insomnia - encouraged sleeping hygiene  staying awake and attending group session  Started on CWIA/ Ativan Protocol Will continue to monitor vitals ,medication compliance and treatment side effects while patient is here.  CSW will start working on disposition.  Patient to participate in therapeutic milieu   Derrill Center, NP 02/10/2017, 7:56 AM

## 2017-02-10 NOTE — B[facility] Group Notes (Signed)
[place] LCSW Group Therapy  02/10/2017 10 AM  Type of Therapy:  Group Therapy  Participation Level:  Did Not Attend invited to participate yet did not despite overhead announcement and encouragement by staff   Sheilah Pigeon, LCSW

## 2017-02-11 MED ORDER — LORAZEPAM 1 MG PO TABS
1.0000 mg | ORAL_TABLET | Freq: Three times a day (TID) | ORAL | Status: DC | PRN
  Administered 2017-02-11 – 2017-02-13 (×5): 1 mg via ORAL
  Filled 2017-02-11 (×6): qty 1

## 2017-02-11 MED ORDER — ACAMPROSATE CALCIUM 333 MG PO TBEC
666.0000 mg | DELAYED_RELEASE_TABLET | Freq: Three times a day (TID) | ORAL | Status: DC
  Administered 2017-02-11 – 2017-02-13 (×5): 666 mg via ORAL
  Filled 2017-02-11 (×8): qty 2

## 2017-02-11 MED ORDER — CLOTRIMAZOLE 1 % EX CREA
TOPICAL_CREAM | Freq: Two times a day (BID) | CUTANEOUS | Status: DC
  Administered 2017-02-11 – 2017-02-13 (×3): via TOPICAL
  Filled 2017-02-11: qty 15

## 2017-02-11 MED ORDER — LORAZEPAM 1 MG PO TABS: 1.0000 mg | ORAL_TABLET | Freq: Three times a day (TID) | ORAL | Status: DC | PRN

## 2017-02-11 NOTE — Progress Notes (Signed)
Recreation Therapy Notes  Date: 02/11/17 Time: 0930 Location: 300 Hall Group Room  Group Topic: Stress Management  Goal Area(s) Addresses:  Patient will verbalize importance of using healthy stress management.  Patient will identify positive emotions associated with healthy stress management.   Intervention: Stress Management  Activity :  LRT introduced the stress management technique of meditation.  LRT played a meditation on gratitude to allow patients to engage in the meditation.  Patients were to follow along as the meditation played to fully participate in the activity.   Education:  Stress Management, Discharge Planning.   Education Outcome: Acknowledges edcuation/In group clarification offered/Needs additional education  Clinical Observations/Feedback: Pt did not attend group.   Victorino Sparrow, LRT/CTRS         Victorino Sparrow A 02/11/2017 11:48 AM

## 2017-02-11 NOTE — Progress Notes (Signed)
Per patient request, [facility] referral faxed and is currently in review.  Maxie Better, MSW, LCSW Clinical Social Worker 02/11/2017 10:53 AM

## 2017-02-11 NOTE — Plan of Care (Signed)
Problem: Activity: Goal: Interest or engagement in activities will improve Outcome: Progressing Pt is attending evening AA groups

## 2017-02-11 NOTE — Progress Notes (Signed)
Patient ID: Dawn Foley, female   DOB: March 27, 1986, 31 y.o.   MRN: [id number]  DAR: Pt. Denies SI/HI and A/V Hallucinations. She reports sleep is fair, appetite is fair, energy level is low, and concentration is good. She rates depression 6/10, hopelessness 10/10, and anxiety 10/10. She reports withdrawal symptoms and has received her Ativan from her withdrawal protocol this morning. Patient also report several physical symptoms, and appears somatically and medication focused. Scheduled and PRN medications administered to patient per physician's orders. Patient does report pain due to abscess and is receiving medication for this to provide relief. Support and encouragement provided to the patient. Scheduled medications administered to patient per physician's orders. Patient is seen in the milieu intermittently. Q15 minute checks are maintained for safety.

## 2017-02-11 NOTE — B[facility] Group Notes (Signed)
[place] LCSW Group Therapy  02/11/2017 3:54 PM  Type of Therapy:  Group Therapy  Participation Level:  Did Not Attend-pt invited. Chose to remain in bed.   Summary of Progress/Problems: Today's Topic: Overcoming Obstacles. Patients identified one short term goal and potential obstacles in reaching this goal. Patients processed barriers involved in overcoming these obstacles. Patients identified steps necessary for overcoming these obstacles and explored motivation (internal and external) for facing these difficulties head on.   [place] LCSW 02/11/2017, 3:54 PM

## 2017-02-11 NOTE — Progress Notes (Signed)
[facility] MD Progress Note  02/11/2017 2:10 PM Dawn Foley  MRN:  [id number]   Subjective: Patient continues to endorse alcohol cravings, however states she feel like the medications is helping. States she is feeling better today with her nausea.   Objective:  Dawn Foley continues to reports concerns with insomnia. Reports she only took " two naps on yesterday." patient is requesting medication increases with her trazodone.  Reports she has been attending some group sessions. Denies suicidal or homicidal ideation during this assessment. continues to report auditory hallucination and racing thoughts.  Report taken her medication as prdx without missing dose. Reports she is tolerating medications well.  Support, encouragement and reassurance was provided.    Principal Problem: Major depressive disorder, recurrent episode with melancholic features ([place]) Diagnosis:   Patient Active Problem List   Diagnosis Date Noted  . Elevated LFTs [R79.89] 02/07/2017  . H/O left hemicolectomy [Z90.49] 02/07/2017  . Sinus tachycardia [R00.0]   . Upper GI bleed [K92.2]   . Thrombocytopenia ([place]) [D69.6]   . History of hepatitis C [Z86.19]   . Alcohol abuse [F10.10]   . Mallory-Weiss tear [K22.6]   . Hypothyroidism [E03.9] 06/02/2016  . Alcohol use disorder, severe, dependence ([place]) [F10.20] 04/07/2016  . Major depressive disorder, recurrent episode with melancholic features ([place]) [N47.0] 04/07/2016   Total Time spent with patient: 30 minutes  Past Psychiatric History: Multiple previous psych admissions for detox treatment and also for [facility] placement for rehab.   Past Medical History:  Past Medical History:  Diagnosis Date  . Alcoholism ([place])   . Depression   . Hepatitis     Past Surgical History:  Procedure Laterality Date  . ESOPHAGOGASTRODUODENOSCOPY (EGD) WITH PROPOFOL N/A 09/04/2016   Procedure: ESOPHAGOGASTRODUODENOSCOPY (EGD) WITH PROPOFOL;  Surgeon: Doran Stabler, MD;  Location:  [place];  Service: Endoscopy;  Laterality: N/A;  . NO PAST SURGERIES     Family History:  Family History  Problem Relation Age of Onset  . Mental illness Other    Family Psychiatric  History: significant for substance abuse.  Social History:  History  Alcohol Use  . Yes    Comment: heavy     History  Drug Use No    Social History   Social History  . Marital status: Single    Spouse name: N/A  . Number of children: N/A  . Years of education: N/A   Social History Main Topics  . Smoking status: Current Every Day Smoker    Packs/day: 1.00    Types: Cigarettes  . Smokeless tobacco: Never Used  . Alcohol use Yes     Comment: heavy  . Drug use: No  . Sexual activity: Yes    Birth control/ protection: Condom   Other Topics Concern  . None   Social History Narrative  . None   Additional Social History:    Pain Medications: pt denies Prescriptions: SEE [place] Over the Counter: pt denies History of alcohol / drug use?: Yes Longest period of sobriety (when/how long): 3 YEARS Negative Consequences of Use: Financial Withdrawal Symptoms: Diarrhea, Tremors, Nausea / Vomiting, Agitation, Seizures Onset of Seizures: Detox seizures Date of most recent seizure: yesterday Name of Substance 1: ALCOHOL 1 - Age of First Use: 13 1 - Amount (size/oz): 1/2 GALLON/WHISKEY 1 - Frequency: DAILY 1 - Duration: ONGOING 1 - Last Use / Amount: 02/04/17 Name of Substance 2: BENZODIAZEPINES- VALIUM, XANAX  2 - Age of First Use: UNK 2 - Amount (size/oz):  UNK 2 - Frequency: UNK 2 - Duration: UNK 2 - Last Use / Amount: STS NOT TAKING NOW BY HX OF DEPENDENCE PER HX Name of Substance 3: "PAIN PILLS" (PT DID NOT KNOW NAME) - NOT RX PER PT ([facility] ED NOTES STS PT WAS ASKING FOR VALIUM) 3 - Age of First Use: UNK 3 - Amount (size/oz): UNK 3 - Frequency: "THE OTHER DAY" "EVERY NOW AND THEN" 3 - Duration: ONGOING 3 - Last Use / Amount: SEVERAL DAYS AGO Name of Substance 4:  NICOTINE/CIGARETTES 4 - Age of First Use: UNK 4 - Amount (size/oz): 20  4 - Frequency: daily 4 - Duration: ongoing 4 - Last Use / Amount: 02/05/17            Sleep: Poor  Appetite:  Poor- nausea   Current Medications: Current Facility-Administered Medications  Medication Dose Route Frequency Provider Last Rate Last Dose  . acamprosate (CAMPRAL) tablet 333 mg  333 mg Oral TID Derrill Center, NP   333 mg at 02/11/17 1122  . acetaminophen (TYLENOL) tablet 500 mg  500 mg Oral TID PRN Lindell Spar I, NP   500 mg at 02/10/17 2113  . alum & mag hydroxide-simeth (MAALOX/MYLANTA) 200-200-20 MG/5ML suspension 30 mL  30 mL Oral Q4H PRN Ethelene Hal, NP      . ARIPiprazole (ABILIFY) tablet 10 mg  10 mg Oral QHS Ambrose Finland, MD   10 mg at 02/10/17 2110  . benzocaine (ORAJEL) 10 % mucosal gel   Mouth/Throat TID PRN Lindell Spar I, NP      . carbamazepine (TEGRETOL) tablet 200 mg  200 mg Oral QID Lindell Spar I, NP   200 mg at 02/11/17 1122  . cephALEXin (KEFLEX) capsule 500 mg  500 mg Oral Q8H Nwoko, Agnes I, NP   500 mg at 02/11/17 1359  . clotrimazole (LOTRIMIN) 1 % cream   Topical BID Shemika Robbs A, MD      . gabapentin (NEURONTIN) capsule 300 mg  300 mg Oral TID Valetta Mulroy, Myer Peer, MD   300 mg at 02/11/17 1122  . hydrOXYzine (ATARAX/VISTARIL) tablet 25 mg  25 mg Oral Q6H PRN Ethelene Hal, NP   25 mg at 02/11/17 1154  . ibuprofen (ADVIL,MOTRIN) tablet 800 mg  800 mg Oral Q6H PRN Hampton Abbot, MD   800 mg at 02/11/17 0809  . levothyroxine (SYNTHROID, LEVOTHROID) tablet 75 mcg  75 mcg Oral QAC breakfast Ethelene Hal, NP   75 mcg at 02/11/17 4010  . magnesium hydroxide (MILK OF MAGNESIA) suspension 30 mL  30 mL Oral Daily PRN Ethelene Hal, NP      . multivitamin with minerals tablet 1 tablet  1 tablet Oral Daily Laverle Hobby, PA-C   1 tablet at 02/11/17 2725  . naphazoline-glycerin (CLEAR EYES) ophth solution 1-2 drop  1-2 drop Both Eyes  QID PRN Lindell Spar I, NP   2 drop at 02/10/17 1505  . nicotine (NICODERM CQ - dosed in mg/24 hours) patch 21 mg  21 mg Transdermal Daily Ethelene Hal, NP   21 mg at 02/11/17 0810  . ondansetron (ZOFRAN-ODT) disintegrating tablet 8 mg  8 mg Oral Once Derrill Center, NP      . thiamine (VITAMIN B-1) tablet 100 mg  100 mg Oral Daily Patriciaann Clan E, PA-C   100 mg at 02/11/17 3664  . traZODone (DESYREL) tablet 100 mg  100 mg Oral QHS PRN Derrill Center, NP   100  mg at 02/10/17 2110    Lab Results:  No results found for this or any previous visit (from the past 48 hour(s)).  Blood Alcohol level:  Lab Results  Component Value Date   ETH 173 (H) 02/05/2017   ETH <5 63/87/5643    Metabolic Disorder Labs: Lab Results  Component Value Date   HGBA1C 4.8 06/02/2016   MPG 91 06/02/2016   No results found for: PROLACTIN Lab Results  Component Value Date   CHOL 223 (H) 06/02/2016   TRIG 62 06/02/2016   HDL 134 06/02/2016   CHOLHDL 1.7 06/02/2016   VLDL 12 06/02/2016   LDLCALC 77 06/02/2016    Physical Findings: AIMS: Facial and Oral Movements Muscles of Facial Expression: None, normal Lips and Perioral Area: None, normal Jaw: None, normal Tongue: None, normal,Extremity Movements Upper (arms, wrists, hands, fingers): None, normal Lower (legs, knees, ankles, toes): None, normal, Trunk Movements Neck, shoulders, hips: None, normal, Overall Severity Severity of abnormal movements (highest score from questions above): None, normal Incapacitation due to abnormal movements: None, normal Patient's awareness of abnormal movements (rate only patient's report): No Awareness, Dental Status Current problems with teeth and/or dentures?: No Does patient usually wear dentures?: No  CIWA:  CIWA-Ar Total: 3 COWS:     Musculoskeletal: Strength & Muscle Tone: within normal limits Gait & Station: normal Patient leans: N/A  Psychiatric Specialty Exam: Physical Exam  Vitals  reviewed. Constitutional: She is oriented to person, place, and time. She appears well-developed.  Cardiovascular: Normal rate.   Neurological: She is alert and oriented to person, place, and time.  Psychiatric: She has a normal mood and affect. Her behavior is normal.    Review of Systems  Gastrointestinal: Positive for nausea.  Musculoskeletal: Positive for myalgias.  Psychiatric/Behavioral: Positive for depression. The patient is nervous/anxious.     Blood pressure 120/77, pulse (!) 107, temperature 98.2 F (36.8 C), temperature source Oral, resp. rate 16, height 5\' 3"  (1.6 m), weight 73.5 kg (162 lb), last menstrual period 01/28/2017.Body mass index is 28.7 kg/m.  General Appearance: Casual paper scrubs  Eye Contact:  Good  Speech:  Clear and Coherent and Pressured  Volume:  Decreased  Mood:  Anxious and Depressed  Affect:  Appropriate  Thought Process:  Coherent  Orientation:  Full (Time, Place, and Person)  Thought Content:  Hallucinations: Auditory and Rumination  Suicidal Thoughts:  No denies during this assessment.   Homicidal Thoughts:  No  Memory:  Immediate;   Fair Recent;   Fair Remote;   Fair  Judgement:  Impaired  Insight:  Fair  Psychomotor Activity:  Restlessness- improving   Concentration:  Concentration: Fair and Attention Span: Fair  Recall:  [employer] of Knowledge:  Good  Language:  Good  Akathisia:  Negative  Handed:  Right  AIMS (if indicated):     Assets:  Desire for Improvement Financial Resources/Insurance Resilience Social Support  ADL's:  Intact  Cognition:  WNL  Sleep:  Number of Hours: 6    I agree with current treatment plan on 02/10/2017, Patient seen face-to-face for psychiatric evaluation follow-up, chart reviewed and case discussed. Reviewed the information documented and agree with the treatment plan.  Treatment Plan Summary: Daily contact with patient to assess and evaluate symptoms and progress in treatment and Medication  management   Continue with current treatment plan on 02/11/2017 except where noted  Continue campra; 333 mg PO TID for [facility] cravings  Continue with Abilify 10 mg, Neurontin 300 mg  for mood stabilization. Increased  with Trazodone 100 mg to 150mg  PRN  for insomnia - encouraged sleeping hygiene  staying awake and attending group session  Started on CWIA/ Ativan Protocol Will continue to monitor vitals ,medication compliance and treatment side effects while patient is here.  CSW will start working on disposition.  Patient to participate in therapeutic milieu   Derrill Center, NP 02/11/2017, 2:10 PM Agree with NP progress note

## 2017-02-11 NOTE — Plan of Care (Signed)
Problem: Medication: Goal: Compliance with prescribed medication regimen will improve Outcome: Progressing Pt has been compliant with medication regimen   

## 2017-02-12 DIAGNOSIS — K7010 Alcoholic hepatitis without ascites: Secondary | ICD-10-CM

## 2017-02-12 DIAGNOSIS — E079 Disorder of thyroid, unspecified: Secondary | ICD-10-CM

## 2017-02-12 NOTE — Progress Notes (Signed)
Nursing Note: 0700-1900  D:  Pt presents with anxious mood and affect, intense eye contact. "I am never going to drink again, I can't do this to my girls, I am going to stop."  Pt given Ativan, Vistaril, Tylenol and Ibuprofen as requested and as ordered. Later in shift, pt asking for Ativan at 1830, not due until 2111, "So am I supposed to feel like this until 9pm, if I were home I would be drinking right now!  A:  Encouraged to verbalize needs and concerns, active listening and support provided.  Continued Q 15 minute safety checks.   R:  Pt. intermittently cooperative and pleasant. She did not complete self inventory as requested.  Denies A/V hallucinations and is able to verbally contract for safety.

## 2017-02-12 NOTE — Progress Notes (Signed)
Recreation Therapy Notes  Animal-Assisted Activity (AAA) Program Checklist/Progress Notes Patient Eligibility Criteria Checklist & Daily Group note for Rec TxIntervention  Date: 02/12/2017 Time: 1:55pm Location: 60 hall dayroom   AAA/T Program Assumption of Risk Form signed by Patient/ or Parent Legal Guardian Yes  Patient is free of allergies or sever asthma Yes  Patient reports no fear of animals Yes  Patient reports no history of cruelty to animals Yes  Patient understands his/her participation is voluntary Yes  Behavioral Response: Patient did not attend.  Dawn Foley, Recreation Therapy Intern

## 2017-02-12 NOTE — Progress Notes (Signed)
[facility] MD Progress Note  02/12/2017 1:34 PM Dawn Foley  MRN:  [id number] Subjective:   31 yo Caucasian female, single, lives with her boyfriend. Background history of Alcohol Use Disorder. Transferred from [facility] for worsening of depression, suicidal ideation with a plan to do anything to kill herself in alcohol withdrawal. Recently cut her arms with the razor in order to kill herself.   Chart reviewed today. Patient discussed at team  Staff reports that she complains of anxiety as she tends to seek benzodiazepine. Objective CIWA scores has been in normal range. Patient has attended some AA meetings but has skipped most of the other groups. She has not voiced any suicidal thoughts. She has not been observed to be internally stimulated. She has been tolerating her medications well. SW reports that the patient declined inpatient rehab. She has expressed interest in IOP. She is scheduled for discharge tomorrow.   Seen today. Tells me that she is done with drinking. Says it has destroyed everything she worked for. [place]. She used to work at [employer]. Says she lost everything. She was recently treated for GI bleed. Patient says she is motivated to stay sober this time around. Says she has seen how alcohol could destroy her. I discussed other effects alcohol could have physically and mentally. She is already on Acamprosate. Patient denies any cravings and seems motivated to stay sober. No sweatiness, no headaches. No retching, nausea or vomiting. No fullness in the head. No visual, tactile or auditory hallucination. No internal restlessness. No suicidal or homicidal thoughts.    Principal Problem: Major depressive disorder, recurrent episode with melancholic features ([place]) Diagnosis:   Patient Active Problem List   Diagnosis Date Noted  . Elevated LFTs [R79.89] 02/07/2017  . H/O left hemicolectomy [Z90.49] 02/07/2017  .  Sinus tachycardia [R00.0]   . Upper GI bleed [K92.2]   . Thrombocytopenia ([place]) [D69.6]   . History of hepatitis C [Z86.19]   . Alcohol abuse [F10.10]   . Mallory-Weiss tear [K22.6]   . Hypothyroidism [E03.9] 06/02/2016  . Alcohol use disorder, severe, dependence ([place]) [F10.20] 04/07/2016  . Major depressive disorder, recurrent episode with melancholic features ([place]) [Y30.1] 04/07/2016   Total Time spent with patient: 20 minutes  Past Psychiatric History: As in H&P  Past Medical History:  Past Medical History:  Diagnosis Date  . Alcoholism ([place])   . Depression   . Hepatitis     Past Surgical History:  Procedure Laterality Date  . ESOPHAGOGASTRODUODENOSCOPY (EGD) WITH PROPOFOL N/A 09/04/2016   Procedure: ESOPHAGOGASTRODUODENOSCOPY (EGD) WITH PROPOFOL;  Surgeon: Doran Stabler, MD;  Location: [facility];  Service: Endoscopy;  Laterality: N/A;  . NO PAST SURGERIES     Family History:  Family History  Problem Relation Age of Onset  . Mental illness Other    Family Psychiatric  History: As in H&P Social History:  History  Alcohol Use  . Yes    Comment: heavy     History  Drug Use No    Social History   Social History  . Marital status: Single    Spouse name: N/A  . Number of children: N/A  . Years of education: N/A   Social History Main Topics  . Smoking status: Current Every Day Smoker    Packs/day: 1.00    Types: Cigarettes  . Smokeless tobacco: Never Used  . Alcohol use Yes  Comment: heavy  . Drug use: No  . Sexual activity: Yes    Birth control/ protection: Condom   Other Topics Concern  . None   Social History Narrative  . None   Additional Social History:    Pain Medications: pt denies Prescriptions: SEE MAR Over the Counter: pt denies History of alcohol / drug use?: Yes Longest period of sobriety (when/how long): 3 YEARS Negative Consequences of Use: Financial Withdrawal Symptoms: Diarrhea, Tremors, Nausea / Vomiting, Agitation,  Seizures Onset of Seizures: Detox seizures Date of most recent seizure: yesterday Name of Substance 1: ALCOHOL 1 - Age of First Use: 13 1 - Amount (size/oz): 1/2 GALLON/WHISKEY 1 - Frequency: DAILY 1 - Duration: ONGOING 1 - Last Use / Amount: 02/04/17 Name of Substance 2: BENZODIAZEPINES- VALIUM, XANAX  2 - Age of First Use: UNK 2 - Amount (size/oz): UNK 2 - Frequency: UNK 2 - Duration: UNK 2 - Last Use / Amount: STS NOT TAKING NOW BY HX OF DEPENDENCE PER HX Name of Substance 3: "PAIN PILLS" (PT DID NOT KNOW NAME) - NOT RX PER PT ([facility] ED NOTES STS PT WAS ASKING FOR VALIUM) 3 - Age of First Use: UNK 3 - Amount (size/oz): UNK 3 - Frequency: "THE OTHER DAY" "EVERY NOW AND THEN" 3 - Duration: ONGOING 3 - Last Use / Amount: SEVERAL DAYS AGO Name of Substance 4: NICOTINE/CIGARETTES 4 - Age of First Use: UNK 4 - Amount (size/oz): 20  4 - Frequency: daily 4 - Duration: ongoing 4 - Last Use / Amount: 02/05/17            Sleep: Good  Appetite:  Good  Current Medications: Current Facility-Administered Medications  Medication Dose Route Frequency Provider Last Rate Last Dose  . acamprosate (CAMPRAL) tablet 666 mg  666 mg Oral TID Cobos, Myer Peer, MD   666 mg at 02/12/17 1156  . acetaminophen (TYLENOL) tablet 500 mg  500 mg Oral TID PRN Lindell Spar I, NP   500 mg at 02/12/17 1156  . alum & mag hydroxide-simeth (MAALOX/MYLANTA) 200-200-20 MG/5ML suspension 30 mL  30 mL Oral Q4H PRN Ethelene Hal, NP      . ARIPiprazole (ABILIFY) tablet 10 mg  10 mg Oral QHS Ambrose Finland, MD   10 mg at 02/11/17 2103  . benzocaine (ORAJEL) 10 % mucosal gel   Mouth/Throat TID PRN Lindell Spar I, NP      . carbamazepine (TEGRETOL) tablet 200 mg  200 mg Oral QID Lindell Spar I, NP   200 mg at 02/12/17 1156  . cephALEXin (KEFLEX) capsule 500 mg  500 mg Oral Q8H Nwoko, Agnes I, NP   500 mg at 02/12/17 1318  . clotrimazole (LOTRIMIN) 1 % cream   Topical BID Cobos, Fernando A, MD      .  gabapentin (NEURONTIN) capsule 300 mg  300 mg Oral TID Cobos, Myer Peer, MD   300 mg at 02/12/17 1156  . hydrOXYzine (ATARAX/VISTARIL) tablet 25 mg  25 mg Oral Q6H PRN Ethelene Hal, NP   25 mg at 02/11/17 2030  . ibuprofen (ADVIL,MOTRIN) tablet 800 mg  800 mg Oral Q6H PRN Hampton Abbot, MD   800 mg at 02/11/17 2030  . levothyroxine (SYNTHROID, LEVOTHROID) tablet 75 mcg  75 mcg Oral QAC breakfast Ethelene Hal, NP   75 mcg at 02/12/17 5009  . LORazepam (ATIVAN) tablet 1 mg  1 mg Oral Q8H PRN Derrill Center, NP   1 mg at 02/12/17 1311  .  magnesium hydroxide (MILK OF MAGNESIA) suspension 30 mL  30 mL Oral Daily PRN Ethelene Hal, NP      . multivitamin with minerals tablet 1 tablet  1 tablet Oral Daily Laverle Hobby, PA-C   1 tablet at 02/12/17 8416  . naphazoline-glycerin (CLEAR EYES) ophth solution 1-2 drop  1-2 drop Both Eyes QID PRN Lindell Spar I, NP   2 drop at 02/10/17 1505  . nicotine (NICODERM CQ - dosed in mg/24 hours) patch 21 mg  21 mg Transdermal Daily Ethelene Hal, NP   21 mg at 02/12/17 0831  . ondansetron (ZOFRAN-ODT) disintegrating tablet 8 mg  8 mg Oral Once Derrill Center, NP      . thiamine (VITAMIN B-1) tablet 100 mg  100 mg Oral Daily Patriciaann Clan E, PA-C   100 mg at 02/12/17 0829  . traZODone (DESYREL) tablet 100 mg  100 mg Oral QHS PRN Derrill Center, NP   100 mg at 02/11/17 2103    Lab Results: No results found for this or any previous visit (from the past 48 hour(s)).  Blood Alcohol level:  Lab Results  Component Value Date   ETH 173 (H) 02/05/2017   ETH <5 60/63/0160    Metabolic Disorder Labs: Lab Results  Component Value Date   HGBA1C 4.8 06/02/2016   MPG 91 06/02/2016   No results found for: PROLACTIN Lab Results  Component Value Date   CHOL 223 (H) 06/02/2016   TRIG 62 06/02/2016   HDL 134 06/02/2016   CHOLHDL 1.7 06/02/2016   VLDL 12 06/02/2016   LDLCALC 77 06/02/2016    Physical Findings: AIMS: Facial  and Oral Movements Muscles of Facial Expression: None, normal Lips and Perioral Area: None, normal Jaw: None, normal Tongue: None, normal,Extremity Movements Upper (arms, wrists, hands, fingers): None, normal Lower (legs, knees, ankles, toes): None, normal, Trunk Movements Neck, shoulders, hips: None, normal, Overall Severity Severity of abnormal movements (highest score from questions above): None, normal Incapacitation due to abnormal movements: None, normal Patient's awareness of abnormal movements (rate only patient's report): No Awareness, Dental Status Current problems with teeth and/or dentures?: No Does patient usually wear dentures?: No  CIWA:  CIWA-Ar Total: 1 COWS:     Musculoskeletal: Strength & Muscle Tone: within normal limits Gait & Station: normal Patient leans: N/A  Psychiatric Specialty Exam: Physical Exam  Review of Systems  Constitutional: Negative.   HENT: Negative.   Eyes: Negative.   Respiratory: Negative.   Cardiovascular: Negative.   Gastrointestinal: Negative.   Genitourinary: Negative.   Musculoskeletal: Negative.   Skin: Negative.   Neurological: Negative.   Endo/Heme/Allergies: Negative.   Psychiatric/Behavioral: Negative.     Blood pressure 123/86, pulse (!) 122, temperature 98.9 F (37.2 C), temperature source Oral, resp. rate 16, height 5\' 3"  (1.6 m), weight 73.5 kg (162 lb), last menstrual period 01/28/2017.Body mass index is 28.7 kg/m.  General Appearance: Calm and cooperative. No tremors. No confusion. Good relatedness.   Eye Contact:  Good  Speech:  Clear and Coherent and Normal Rate  Volume:  Normal  Mood:  Euthymic  Affect:  Appropriate and Full Range  Thought Process:  Linear  Orientation:  Full (Time, Place, and Person)  Thought Content:  Future oriented. No delusional theme. No preoccupation with violent thoughts. No negative ruminations. No obsession.  No hallucination in any modality.   Suicidal Thoughts:  No  Homicidal  Thoughts:  No  Memory:  Immediate;   Good Recent;  Good Remote;   Good  Judgement:  Good  Insight:  Good  Psychomotor Activity:  Normal  Concentration:  Concentration: Good and Attention Span: Good  Recall:  Good  Fund of Knowledge:  Good  Language:  Good  Akathisia:  Negative  Handed:    AIMS (if indicated):     Assets:  Communication Skills Desire for Improvement Housing Intimacy Resilience Vocational/Educational  ADL's:  Intact  Cognition:  WNL  Sleep:  Number of Hours: 5.5     Treatment Plan Summary: Patient has come off alcohol completely. She is not confused. She is not psychotic. She is not depressed. No dangerousness. She is motivated to stay sober. Scheduled for discharge tomorrow.   Psychiatric: Alcohol Use Disorder Substance Induced Mood Disorder  Medical: Alcoholic hepatitis Thyroid disorder  Psychosocial:  Loss of custody Legal issues  PLAN: 1. Continue current regimen 2. Encourage unit groups and activities 3. Monitor mood, behavior and interaction with peers 4. Home tomorrow.    [place], MD 02/12/2017, 1:34 PM

## 2017-02-12 NOTE — Tx Team (Signed)
Interdisciplinary Treatment and Diagnostic Plan Update  02/12/2017 Time of Session: 0930 Dawn Foley MRN: [id number]  Principal Diagnosis: Alcohol Use Disorder Severe  Secondary Diagnoses: Principal Problem:   Major depressive disorder, recurrent episode with melancholic features ([place]) Active Problems:   Alcohol use disorder, severe, dependence ([place])   Hypothyroidism   Current Medications:  Current Facility-Administered Medications  Medication Dose Route Frequency Provider Last Rate Last Dose  . acamprosate (CAMPRAL) tablet 666 mg  666 mg Oral TID Cobos, Myer Peer, MD   666 mg at 02/12/17 0829  . acetaminophen (TYLENOL) tablet 500 mg  500 mg Oral TID PRN Lindell Spar I, NP   500 mg at 02/12/17 0144  . alum & mag hydroxide-simeth (MAALOX/MYLANTA) 200-200-20 MG/5ML suspension 30 mL  30 mL Oral Q4H PRN Ethelene Hal, NP      . ARIPiprazole (ABILIFY) tablet 10 mg  10 mg Oral QHS Ambrose Finland, MD   10 mg at 02/11/17 2103  . benzocaine (ORAJEL) 10 % mucosal gel   Mouth/Throat TID PRN Lindell Spar I, NP      . carbamazepine (TEGRETOL) tablet 200 mg  200 mg Oral QID Lindell Spar I, NP   200 mg at 02/12/17 0829  . cephALEXin (KEFLEX) capsule 500 mg  500 mg Oral Q8H Nwoko, Agnes I, NP   500 mg at 02/12/17 4132  . clotrimazole (LOTRIMIN) 1 % cream   Topical BID Cobos, Fernando A, MD      . gabapentin (NEURONTIN) capsule 300 mg  300 mg Oral TID Cobos, Myer Peer, MD   300 mg at 02/12/17 4401  . hydrOXYzine (ATARAX/VISTARIL) tablet 25 mg  25 mg Oral Q6H PRN Ethelene Hal, NP   25 mg at 02/11/17 2030  . ibuprofen (ADVIL,MOTRIN) tablet 800 mg  800 mg Oral Q6H PRN Hampton Abbot, MD   800 mg at 02/11/17 2030  . levothyroxine (SYNTHROID, LEVOTHROID) tablet 75 mcg  75 mcg Oral QAC breakfast Ethelene Hal, NP   75 mcg at 02/12/17 0272  . LORazepam (ATIVAN) tablet 1 mg  1 mg Oral Q8H PRN Derrill Center, NP   1 mg at 02/12/17 0144  . magnesium hydroxide (MILK OF  MAGNESIA) suspension 30 mL  30 mL Oral Daily PRN Ethelene Hal, NP      . multivitamin with minerals tablet 1 tablet  1 tablet Oral Daily Laverle Hobby, PA-C   1 tablet at 02/12/17 5366  . naphazoline-glycerin (CLEAR EYES) ophth solution 1-2 drop  1-2 drop Both Eyes QID PRN Lindell Spar I, NP   2 drop at 02/10/17 1505  . nicotine (NICODERM CQ - dosed in mg/24 hours) patch 21 mg  21 mg Transdermal Daily Ethelene Hal, NP   21 mg at 02/12/17 0831  . ondansetron (ZOFRAN-ODT) disintegrating tablet 8 mg  8 mg Oral Once Derrill Center, NP      . thiamine (VITAMIN B-1) tablet 100 mg  100 mg Oral Daily Patriciaann Clan E, PA-C   100 mg at 02/12/17 0829  . traZODone (DESYREL) tablet 100 mg  100 mg Oral QHS PRN Derrill Center, NP   100 mg at 02/11/17 2103   PTA Medications: Prescriptions Prior to Admission  Medication Sig Dispense Refill Last Dose  . carbamazepine (TEGRETOL) 200 MG tablet Take 200 mg by mouth 4 (four) times daily.   02/03/2017  . folic acid (FOLVITE) 1 MG tablet Take 1 tablet (1 mg total) by mouth daily. 30 tablet 0 2  years ago  . gabapentin (NEURONTIN) 300 MG capsule Take 300 mg by mouth 3 (three) times daily.   02/05/2017  . hydrOXYzine (ATARAX/VISTARIL) 50 MG tablet Take 1 tablet (50 mg total) by mouth 3 (three) times daily as needed for anxiety. 60 tablet 0 02/04/2017  . ibuprofen (ADVIL,MOTRIN) 800 MG tablet Take 800 mg by mouth every 6 (six) hours as needed.   02/06/2017  . levothyroxine (SYNTHROID, LEVOTHROID) 75 MCG tablet Take 1 tablet (75 mcg total) by mouth daily before breakfast. For thyroid hormone replacement 30 tablet 0 02/07/2017  . lip balm (CARMEX) ointment Apply topically as needed for lip care. 7 g 0 4-5 days ago  . Multiple Vitamin (MULTIVITAMIN WITH MINERALS) TABS tablet Take 1 tablet by mouth daily. For low Vitamin   2 months ago  . QUEtiapine (SEROQUEL) 300 MG tablet Take 300 mg by mouth 3 (three) times daily.   02/03/2017  . tetrahydrozoline 0.05 %  ophthalmic solution Place 2-3 drops into both eyes 4 (four) times daily as needed (For eye irritation and allergies.).   02/06/2017  . thiamine 100 MG tablet Take 1 tablet (100 mg total) by mouth daily. 30 tablet 0 02/06/2017    Patient Stressors: Financial difficulties Health problems Marital or family conflict Substance abuse Traumatic event  Patient Strengths: Capable of independent living Curator fund of knowledge  Treatment Modalities: Medication Management, Group therapy, Case management,  1 to 1 session with clinician, Psychoeducation, Recreational therapy.   Physician Treatment Plan for Primary Diagnosis: Alcohol Use Disorder Severe  Medication Management: Evaluate patient's response, side effects, and tolerance of medication regimen.  Therapeutic Interventions: 1 to 1 sessions, Unit Group sessions and Medication administration.  Evaluation of Outcomes: Progressing  Physician Treatment Plan for Secondary Diagnosis: Principal Problem:   Major depressive disorder, recurrent episode with melancholic features ([place]) Active Problems:   Alcohol use disorder, severe, dependence ([place])   Hypothyroidism  Long Term Goal(s): Improvement in symptoms so as ready for discharge Improvement in symptoms so as ready for discharge   Short Term Goals: Ability to verbalize feelings will improve Ability to disclose and discuss suicidal ideas Ability to demonstrate self-control will improve Ability to identify and develop effective coping behaviors will improve Ability to maintain clinical measurements within normal limits will improve Compliance with prescribed medications will improve Ability to identify triggers associated with substance abuse/mental health issues will improve     Medication Management: Evaluate patient's response, side effects, and tolerance of medication regimen.  Therapeutic Interventions: 1 to 1 sessions, Unit Group sessions and Medication  administration.  Evaluation of Outcomes: Met   RN Treatment Plan for Primary Diagnosis: Alcohol Use Disorder Severe Long Term Goal(s): Knowledge of disease and therapeutic regimen to maintain health will improve  Short Term Goals: Ability to remain free from injury will improve, Ability to verbalize feelings will improve and Ability to disclose and discuss suicidal ideas  Medication Management: RN will administer medications as ordered by provider, will assess and evaluate patient's response and provide education to patient for prescribed medication. RN will report any adverse and/or side effects to prescribing provider.  Therapeutic Interventions: 1 on 1 counseling sessions, Psychoeducation, Medication administration, Evaluate responses to treatment, Monitor vital signs and CBGs as ordered, Perform/monitor CIWA, COWS, AIMS and Fall Risk screenings as ordered, Perform wound care treatments as ordered.  Evaluation of Outcomes: Met   LCSW Treatment Plan for Primary Diagnosis: Alcohol Use Disorder Severe Long Term Goal(s): Safe transition to appropriate next level of care  at discharge, Engage patient in therapeutic group addressing interpersonal concerns.  Short Term Goals: Engage patient in aftercare planning with referrals and resources, Facilitate patient progression through stages of change regarding substance use diagnoses and concerns and Identify triggers associated with mental health/substance abuse issues  Therapeutic Interventions: Assess for all discharge needs, 1 to 1 time with Social worker, Explore available resources and support systems, Assess for adequacy in community support network, Educate family and significant other(s) on suicide prevention, Complete Psychosocial Assessment, Interpersonal group therapy.  Evaluation of Outcomes: Met   Progress in Treatment: Attending groups: Intermittently.  Participating in groups: Minimally, when she attends  Taking medication as  prescribed: Yes. Toleration medication: Yes. Family/Significant other contact made: SPE completed with pt; pt declined to consent to family contact.  Patient understands diagnosis: Yes. Discussing patient identified problems/goals with staff: Yes. Medical problems stabilized or resolved: Yes. Denies suicidal/homicidal ideation: Yes. Issues/concerns per patient self-inventory: No. Other: n/a   New problem(s) identified: No, Describe:  n/a  New Short Term/Long Term Goal(s): elimination of SI thoughts; AH, medication stabilization; ETOH detox; development of comprehensive mental wellness/sobriety plan.   Discharge Plan or Barriers: Pt is now declining [facility] and [facility]. She would like to return home with her father; follow-up at [place] for [facility] and [facility] for medication management.   Reason for Continuation of Hospitalization: medication management   Estimated Length of Stay: 1 day   Attendees: Patient: 02/12/2017 11:01 AM  Physician: Dr. Sanjuana Letters MD 02/12/2017 11:01 AM  Nursing: Zena Amos RN 02/12/2017 11:01 AM  RN Care Manager: Lars Pinks CM 02/12/2017 11:01 AM  Social Worker: Press photographer, LCSW 02/12/2017 11:01 AM  Recreational Therapist: x 02/12/2017 11:01 AM  Other: Lindell Spar NP 02/12/2017 11:01 AM  Other:  02/12/2017 11:01 AM  Other: 02/12/2017 11:01 AM    Scribe for Treatment Team: Kimber Relic Smart, LCSW 02/12/2017 11:01 AM

## 2017-02-12 NOTE — Progress Notes (Signed)
D: Pt at the time of assessment was flat, isolative and withdrawn to her room. Pt endorsed moderate anxiety, depression and generalized body pain. Pt complained of several withdrawal symptoms; "I got pains all over my body, my hand is shaking and I just don't feel good. Pt denied AVH, SI or HI.  A: Medications offered as prescribed. All patient's questions and concerns addressed. Support, encouragement, and safe environment provided. Will continue to monitor for any changes. 15-minute safety checks continue. R: Pt was med compliant.  Pt attended wrap-up group. Safety checks continue.

## 2017-02-12 NOTE — [facility] Group Notes (Signed)
[place] LCSW Group Therapy  02/12/2017 3:20 PM  Type of Therapy:  Group Therapy  Participation Level:  Active  Participation Quality:  Drowsy  Affect:  Lethargic  Cognitive:  Oriented  Insight:  Limited  Engagement in Therapy:  Limited  Modes of Intervention:  Discussion, Education, Exploration, Problem-solving, Socialization and Support  Summary of Progress/Problems: [facility] Speaker came to talk about his personal journey with substance abuse and addiction. The pt processed ways by which to relate to the speaker. [place] speaker provided handouts and educational information pertaining to groups and services offered by the [facility].   Jamian Andujo N Smart LCSW 02/12/2017, 3:20 PM

## 2017-02-13 MED ORDER — IBUPROFEN 800 MG PO TABS: 800.0000 mg | ORAL_TABLET | Freq: Four times a day (QID) | ORAL | 0 refills | Status: DC | PRN

## 2017-02-13 MED ORDER — NAPHAZOLINE-GLYCERIN 0.012-0.2 % OP SOLN: 1.0000 [drp] | Freq: Four times a day (QID) | OPHTHALMIC | 0 refills | Status: DC | PRN

## 2017-02-13 MED ORDER — LEVOTHYROXINE SODIUM 75 MCG PO TABS: 75.0000 ug | ORAL_TABLET | Freq: Every day | ORAL | 0 refills | Status: DC

## 2017-02-13 MED ORDER — BENZOCAINE 10 % MT GEL: Freq: Three times a day (TID) | OROMUCOSAL | 0 refills | Status: DC | PRN

## 2017-02-13 MED ORDER — CARBAMAZEPINE 200 MG PO TABS
200.0000 mg | ORAL_TABLET | Freq: Four times a day (QID) | ORAL | 0 refills | Status: DC
Start: 2017-02-13 — End: 2017-06-18

## 2017-02-13 MED ORDER — ARIPIPRAZOLE 10 MG PO TABS: 10.0000 mg | ORAL_TABLET | Freq: Every day | ORAL | 0 refills | Status: DC

## 2017-02-13 MED ORDER — ACAMPROSATE CALCIUM 333 MG PO TBEC: 666.0000 mg | DELAYED_RELEASE_TABLET | Freq: Three times a day (TID) | ORAL | 0 refills | Status: DC

## 2017-02-13 MED ORDER — TRAZODONE HCL 100 MG PO TABS: 100.0000 mg | ORAL_TABLET | Freq: Every evening | ORAL | 0 refills | Status: DC | PRN

## 2017-02-13 MED ORDER — ADULT MULTIVITAMIN W/MINERALS CH: 1.0000 | ORAL_TABLET | Freq: Every day | ORAL | Status: DC

## 2017-02-13 MED ORDER — THIAMINE HCL 100 MG PO TABS: 100.0000 mg | ORAL_TABLET | Freq: Every day | ORAL | 0 refills | Status: DC

## 2017-02-13 MED ORDER — NICOTINE 21 MG/24HR TD PT24: 21.0000 mg | MEDICATED_PATCH | Freq: Every day | TRANSDERMAL | 0 refills | Status: DC

## 2017-02-13 MED ORDER — HYDROXYZINE HCL 25 MG PO TABS
25.0000 mg | ORAL_TABLET | Freq: Four times a day (QID) | ORAL | 0 refills | Status: DC | PRN
Start: 2017-02-13 — End: 2017-06-18

## 2017-02-13 MED ORDER — CEPHALEXIN 500 MG PO CAPS: 500.0000 mg | ORAL_CAPSULE | Freq: Three times a day (TID) | ORAL | 0 refills | Status: DC

## 2017-02-13 MED ORDER — GABAPENTIN 300 MG PO CAPS: 300.0000 mg | ORAL_CAPSULE | Freq: Three times a day (TID) | ORAL | 0 refills | Status: DC

## 2017-02-13 NOTE — Progress Notes (Signed)
D: Pt at the time of assessment was flat, isolative and withdrawn to self even while in the dayroom. Pt sudden decided she wasn't playing cards with peers anymore midgame. Pt endorsed moderate anxiety, depression and mouth pain. Pt complained of several withdrawal symptoms; "I got pains all over my body, my hand is shaking and I just don't feel good. Pt denied AVH, SI or HI.  A: Medications offered as prescribed. All patient's questions and concerns addressed. Support, encouragement, and safe environment provided. Will continue to monitor for any changes. 15-minute safety checks continue. R: Pt was med compliant.  Pt attended wrap-up group. Safety checks continue.

## 2017-02-13 NOTE — Tx Team (Signed)
Interdisciplinary Treatment and Diagnostic Plan Update  02/13/2017 Time of Session: 0930 Dawn Foley MRN: [id number]  Principal Diagnosis: Alcohol Use Disorder Severe  Secondary Diagnoses: Principal Problem:   Major depressive disorder, recurrent episode with melancholic features ([place]) Active Problems:   Alcohol use disorder, severe, dependence ([place])   Hypothyroidism   Current Medications:  Current Facility-Administered Medications  Medication Dose Route Frequency Provider Last Rate Last Dose  . acamprosate (CAMPRAL) tablet 666 mg  666 mg Oral TID Cobos, Myer Peer, MD   666 mg at 02/13/17 0818  . acetaminophen (TYLENOL) tablet 500 mg  500 mg Oral TID PRN Lindell Spar I, NP   500 mg at 02/13/17 0640  . alum & mag hydroxide-simeth (MAALOX/MYLANTA) 200-200-20 MG/5ML suspension 30 mL  30 mL Oral Q4H PRN Ethelene Hal, NP      . ARIPiprazole (ABILIFY) tablet 10 mg  10 mg Oral QHS Ambrose Finland, MD   10 mg at 02/12/17 2115  . benzocaine (ORAJEL) 10 % mucosal gel   Mouth/Throat TID PRN Lindell Spar I, NP      . carbamazepine (TEGRETOL) tablet 200 mg  200 mg Oral QID Lindell Spar I, NP   200 mg at 02/13/17 0819  . cephALEXin (KEFLEX) capsule 500 mg  500 mg Oral Q8H Nwoko, Agnes I, NP   500 mg at 02/13/17 0093  . clotrimazole (LOTRIMIN) 1 % cream   Topical BID Cobos, Fernando A, MD      . gabapentin (NEURONTIN) capsule 300 mg  300 mg Oral TID Cobos, Myer Peer, MD   300 mg at 02/13/17 0818  . hydrOXYzine (ATARAX/VISTARIL) tablet 25 mg  25 mg Oral Q6H PRN Ethelene Hal, NP   25 mg at 02/12/17 1828  . ibuprofen (ADVIL,MOTRIN) tablet 800 mg  800 mg Oral Q6H PRN Hampton Abbot, MD   800 mg at 02/13/17 0243  . levothyroxine (SYNTHROID, LEVOTHROID) tablet 75 mcg  75 mcg Oral QAC breakfast Ethelene Hal, NP   75 mcg at 02/13/17 8182  . LORazepam (ATIVAN) tablet 1 mg  1 mg Oral Q8H PRN Derrill Center, NP   1 mg at 02/13/17 0824  . magnesium hydroxide (MILK OF  MAGNESIA) suspension 30 mL  30 mL Oral Daily PRN Ethelene Hal, NP      . multivitamin with minerals tablet 1 tablet  1 tablet Oral Daily Laverle Hobby, PA-C   1 tablet at 02/13/17 0818  . naphazoline-glycerin (CLEAR EYES) ophth solution 1-2 drop  1-2 drop Both Eyes QID PRN Lindell Spar I, NP   2 drop at 02/10/17 1505  . nicotine (NICODERM CQ - dosed in mg/24 hours) patch 21 mg  21 mg Transdermal Daily Ethelene Hal, NP   21 mg at 02/13/17 0819  . ondansetron (ZOFRAN-ODT) disintegrating tablet 8 mg  8 mg Oral Once Derrill Center, NP      . thiamine (VITAMIN B-1) tablet 100 mg  100 mg Oral Daily Patriciaann Clan E, PA-C   100 mg at 02/13/17 0818  . traZODone (DESYREL) tablet 100 mg  100 mg Oral QHS PRN Derrill Center, NP   100 mg at 02/12/17 2115   PTA Medications: Prescriptions Prior to Admission  Medication Sig Dispense Refill Last Dose  . carbamazepine (TEGRETOL) 200 MG tablet Take 200 mg by mouth 4 (four) times daily.   02/03/2017  . folic acid (FOLVITE) 1 MG tablet Take 1 tablet (1 mg total) by mouth daily. 30 tablet 0 2  years ago  . gabapentin (NEURONTIN) 300 MG capsule Take 300 mg by mouth 3 (three) times daily.   02/05/2017  . hydrOXYzine (ATARAX/VISTARIL) 50 MG tablet Take 1 tablet (50 mg total) by mouth 3 (three) times daily as needed for anxiety. 60 tablet 0 02/04/2017  . ibuprofen (ADVIL,MOTRIN) 800 MG tablet Take 800 mg by mouth every 6 (six) hours as needed.   02/06/2017  . levothyroxine (SYNTHROID, LEVOTHROID) 75 MCG tablet Take 1 tablet (75 mcg total) by mouth daily before breakfast. For thyroid hormone replacement 30 tablet 0 02/07/2017  . lip balm (CARMEX) ointment Apply topically as needed for lip care. 7 g 0 4-5 days ago  . Multiple Vitamin (MULTIVITAMIN WITH MINERALS) TABS tablet Take 1 tablet by mouth daily. For low Vitamin   2 months ago  . QUEtiapine (SEROQUEL) 300 MG tablet Take 300 mg by mouth 3 (three) times daily.   02/03/2017  . tetrahydrozoline 0.05 %  ophthalmic solution Place 2-3 drops into both eyes 4 (four) times daily as needed (For eye irritation and allergies.).   02/06/2017  . thiamine 100 MG tablet Take 1 tablet (100 mg total) by mouth daily. 30 tablet 0 02/06/2017    Patient Stressors: Financial difficulties Health problems Marital or family conflict Substance abuse Traumatic event  Patient Strengths: Capable of independent living Curator fund of knowledge  Treatment Modalities: Medication Management, Group therapy, Case management,  1 to 1 session with clinician, Psychoeducation, Recreational therapy.   Physician Treatment Plan for Primary Diagnosis: Alcohol Use Disorder Severe  Medication Management: Evaluate patient's response, side effects, and tolerance of medication regimen.  Therapeutic Interventions: 1 to 1 sessions, Unit Group sessions and Medication administration.  Evaluation of Outcomes: Met  Physician Treatment Plan for Secondary Diagnosis: Principal Problem:   Major depressive disorder, recurrent episode with melancholic features ([place]) Active Problems:   Alcohol use disorder, severe, dependence ([place])   Hypothyroidism  Long Term Goal(s): Improvement in symptoms so as ready for discharge Improvement in symptoms so as ready for discharge   Short Term Goals: Ability to verbalize feelings will improve Ability to disclose and discuss suicidal ideas Ability to demonstrate self-control will improve Ability to identify and develop effective coping behaviors will improve Ability to maintain clinical measurements within normal limits will improve Compliance with prescribed medications will improve Ability to identify triggers associated with substance abuse/mental health issues will improve     Medication Management: Evaluate patient's response, side effects, and tolerance of medication regimen.  Therapeutic Interventions: 1 to 1 sessions, Unit Group sessions and Medication  administration.  Evaluation of Outcomes: Met   RN Treatment Plan for Primary Diagnosis: Alcohol Use Disorder Severe Long Term Goal(s): Knowledge of disease and therapeutic regimen to maintain health will improve  Short Term Goals: Ability to remain free from injury will improve, Ability to verbalize feelings will improve and Ability to disclose and discuss suicidal ideas  Medication Management: RN will administer medications as ordered by provider, will assess and evaluate patient's response and provide education to patient for prescribed medication. RN will report any adverse and/or side effects to prescribing provider.  Therapeutic Interventions: 1 on 1 counseling sessions, Psychoeducation, Medication administration, Evaluate responses to treatment, Monitor vital signs and CBGs as ordered, Perform/monitor CIWA, COWS, AIMS and Fall Risk screenings as ordered, Perform wound care treatments as ordered.  Evaluation of Outcomes: Met   LCSW Treatment Plan for Primary Diagnosis: Alcohol Use Disorder Severe Long Term Goal(s): Safe transition to appropriate next level of care  at discharge, Engage patient in therapeutic group addressing interpersonal concerns.  Short Term Goals: Engage patient in aftercare planning with referrals and resources, Facilitate patient progression through stages of change regarding substance use diagnoses and concerns and Identify triggers associated with mental health/substance abuse issues  Therapeutic Interventions: Assess for all discharge needs, 1 to 1 time with Social worker, Explore available resources and support systems, Assess for adequacy in community support network, Educate family and significant other(s) on suicide prevention, Complete Psychosocial Assessment, Interpersonal group therapy.  Evaluation of Outcomes: Met   Progress in Treatment: Attending groups: Intermittently.  Participating in groups: Minimally, when she attends  Taking medication as  prescribed: Yes. Toleration medication: Yes. Family/Significant other contact made: SPE completed with pt; pt declined to consent to family contact.  Patient understands diagnosis: Yes. Discussing patient identified problems/goals with staff: Yes. Medical problems stabilized or resolved: Yes. Denies suicidal/homicidal ideation: Yes. Issues/concerns per patient self-inventory: No. Other: n/a   New problem(s) identified: No, Describe:  n/a  New Short Term/Long Term Goal(s): elimination of SI thoughts; AH, medication stabilization; ETOH detox; development of comprehensive mental wellness/sobriety plan.   Discharge Plan or Barriers: Pt is now declining [facility] and [facility]. She would like to return home with her father; follow-up at [facility] for [facility] and [facility] for medication management.   Reason for Continuation of Hospitalization: none  Estimated Length of Stay: d/c today    Attendees: Patient: 02/13/2017 9:10 AM  Physician: Dr. Sanjuana Letters MD 02/13/2017 9:10 AM  Nursing: Jinny Sanders RN; Opal Sidles RN 02/13/2017 9:10 AM  RN Care Manager: Lars Pinks CM 02/13/2017 9:10 AM  Social Worker: Press photographer, LCSW 02/13/2017 9:10 AM  Recreational Therapist: x 02/13/2017 9:10 AM  Other: Lindell Spar NP 02/13/2017 9:10 AM  Other:  02/13/2017 9:10 AM  Other: 02/13/2017 9:10 AM    Scribe for Treatment Team: Kimber Relic Smart, LCSW 02/13/2017 9:10 AM

## 2017-02-13 NOTE — Progress Notes (Signed)
Pt discharged home with her boy friend. Pt was ambulatory, stable and appreciative at that time. All papers and prescriptions were given and valuables returned. Verbal understanding expressed. Denies SI/HI and A/VH. Pt given opportunity to express concerns and ask questions.

## 2017-02-13 NOTE — [facility] Suicide Risk Assessment (Signed)
[facility] Discharge Suicide Risk Assessment   Principal Problem: Major depressive disorder, recurrent episode with melancholic features [facility]) Discharge Diagnoses:  Patient Active Problem List   Diagnosis Date Noted  . Elevated LFTs [R79.89] 02/07/2017  . H/O left hemicolectomy [Z90.49] 02/07/2017  . Sinus tachycardia [R00.0]   . Upper GI bleed [K92.2]   . Thrombocytopenia ([place]) [D69.6]   . History of hepatitis C [Z86.19]   . Alcohol abuse [F10.10]   . Mallory-Weiss tear [K22.6]   . Hypothyroidism [E03.9] 06/02/2016  . Alcohol use disorder, severe, dependence ([place]) [F10.20] 04/07/2016  . Major depressive disorder, recurrent episode with melancholic features ([place]) [E83.1] 04/07/2016    Total Time spent with patient: 45 minutes  Musculoskeletal: Strength & Muscle Tone: within normal limits Gait & Station: normal Patient leans: N/A  Psychiatric Specialty Exam: Review of Systems  Constitutional: Negative.   HENT: Negative.   Eyes: Negative.   Respiratory: Negative.   Cardiovascular: Negative.   Gastrointestinal: Negative.   Genitourinary: Negative.   Musculoskeletal: Negative.   Skin: Negative.   Neurological: Negative.   Endo/Heme/Allergies: Negative.   Psychiatric/Behavioral: Negative for depression, hallucinations, memory loss, substance abuse and suicidal ideas. The patient is not nervous/anxious and does not have insomnia.     Blood pressure 122/74, pulse (!) 116, temperature 98.4 F (36.9 C), temperature source Oral, resp. rate (!) 116, height 5\' 3"  (1.6 m), weight 73.5 kg (162 lb), last menstrual period 01/28/2017.Body mass index is 28.7 kg/m.  General Appearance: Neatly dressed, pleasant, engaging well and cooperative. Appropriate behavior. Not in any distress. Good relatedness. Not internally stimulated  Eye Contact::  Good  Speech:  Spontaneous, normal prosody. Normal tone and rate.   Volume:  Normal  Mood:  Euthymic  Affect:  Appropriate and Full Range  Thought  Process:  Goal Directed and Linear  Orientation:  Full (Time, Place, and Person)  Thought Content:  Future oriented. No delusional theme. No preoccupation with violent thoughts. No negative ruminations. No obsession.  No hallucination in any modality.   Suicidal Thoughts:  No  Homicidal Thoughts:  No  Memory:  Immediate;   Good Recent;   Good Remote;   Good  Judgement:  Good  Insight:  Good  Psychomotor Activity:  Normal  Concentration:  Good  Recall:  Good  Fund of Knowledge:Good  Language: Good  Akathisia:  Negative  Handed:    AIMS (if indicated):     Assets:  Communication Skills Desire for Improvement Housing Resilience Social Support Vocational/Educational  Sleep:  Number of Hours: 6.5  Cognition: WNL  ADL's:  Intact   Clinical Assessment::   31 yo Caucasian female, single, lives with her boyfriend. Background history of Alcohol Use Disorder. Transferred from [facility] for worsening of depression, suicidal ideation with a plan to do anything to kill herself in alcohol withdrawal. Recently cut her arms with the razor in order to kill herself.   Seen today. Reports thats he is in good spirits. Not feeling depressed. Reports normal energy and interest. Has been maintaining normal biological functions. She is able to think clearly. She is able to focus on task. Her thoughts are not crowded or racing. No evidence of mania. No hallucination in any modality. She is not making any delusional statement. No passivity of will/thought. She is fully in touch with reality. No thoughts of suicide. No thoughts of homicide. No violent thoughts. No overwhelming anxiety.  Nursing staff reports that patient has been appropriate on the unit. Patient has been interacting well  with peers. No behavioral issues. Patient has not voiced any suicidal thoughts. Patient has not been observed to be internally stimulated. Patient has been adherent with treatment recommendations. Patient has been  tolerating their medication well.   Patient was discussed at team. Team members feels that patient is back to her baseline level of function. Team agrees with plan to discharge patient today.    Demographic Factors:  NA  Loss Factors: Loss of significant relationship  Historical Factors: Impulsivity  Risk Reduction Factors:   Responsible for children under 53 years of age, Sense of responsibility to family, Positive social support, Positive therapeutic relationship and Positive coping skills or problem solving skills  Continued Clinical Symptoms:  As above  Cognitive Features That Contribute To Risk:  None    Suicide Risk:  Minimal: No identifiable suicidal ideation.  Patient is not having any thoughts of suicide at this time. Modifiable risk factors targeted during this admission includes depression and substance use. Demographical and historical risk factors cannot be modified. Patient is now engaging well. Patient is reliable and is future oriented. We have buffered patient's support structures. At this point, patient is at low risk of suicide. Patient is aware of the effects of psychoactive substances on decision making process. Patient has been provided with emergency contacts. Patient acknowledges to use resources provided if unforseen circumstances changes their current risk stratification.    Follow-up [place], [facility] Follow up on 02/15/2017.   Why:  Appt on Friday at 9:45AM for hospital follow-up. Please bring: Medicaid card. Thank you.  Contact information: [place] [phone]        [facility] Follow up.   Why:  Walk in within 7 days of hospital discharge to be assessed for Substance Abuse Intensive Outpatient Program/Counseling services. Walk-in Triage hours: Monday & Wednesday 12pm-2pm and Friday 8am-10am. Thank you.  Contact information: [address] [place], [place] [address] Phone: [phone] Fax:  [phone]          Plan Of Care/Follow-up recommendations:  1. Continue current psychotropic medications 2. Mental health and addiction follow up as arranged.  3. Provided limited quantity of prescriptions   Artist Beach, MD 02/13/2017, 9:15 AM

## 2017-02-13 NOTE — Discharge Summary (Signed)
Physician Discharge Summary Note  Patient:  Dawn Foley is an 31 y.o., female  MRN:  [id number]  DOB:  August 10, 1986  Patient phone:  [phone] (home)   Patient address:   [address] [facility] [address] [place] [address],   Date of Admission:  02/06/2017  Date of Discharge: 02/13/17  Reason for Admission:  Alcohol detox/mood stabilization treatments.  Discharge Diagnoses: Principal Problem:   Major depressive disorder, recurrent episode with melancholic features ([place]) Active Problems:   Alcohol use disorder, severe, dependence ([place])   Hypothyroidism  Review of Systems  Constitutional: Negative.   HENT: Negative.   Eyes: Negative.   Respiratory: Negative.   Cardiovascular: Negative.   Gastrointestinal: Negative.   Genitourinary: Negative.   Musculoskeletal: Negative.   Skin: Negative.   Neurological: Negative.   Endo/Heme/Allergies: Negative.   Psychiatric/Behavioral: Positive for substance abuse (Alcoholism). Negative for depression, hallucinations, memory loss and suicidal ideas. The patient is nervous/anxious (Stabilized with medication prior to discharge) and has insomnia (Stabilized with medication prior to discharge).    Past Medical History:  Diagnosis Date  . Alcoholism ([place])   . Depression   . Hepatitis    Level of Care:  OP  Hospital Course: Dawn Foley is a 52 yo Caucasianfemale, single, lives with her boyfriend. Background history of Alcohol Use Disorder. Transferred from [facility] for worsening of depression, suicidal ideation with a plan to do anything to kill herself in alcohol withdrawal. Recently cut her arms with the razor in order to kill herself. He liver enzymes ([place]) was elevated, possibly from chronic alcoholism. She was in need of alcohol detoxification treatments.  After her admission assessment, Dawn Foley was started on medication regimen for her presenting symptoms. She received Ativan detoxification regimen on a tapering dose for alcohol  detoxification treatments. Besides the detoxification treatments, Dawn Foley was also medicated & discharged on; Abilify 10 mg for mood control, Gabapentin 300 mg for agitation, Hydroxyzine 25 mg prn for anxiety, Campral 666 mg for alcoholism, Tegretol 200 mg for mood stabilization, Nicotine patch 21 mg for smoking cessation, Thiamine 100 mg for thiamine deficiency & Trazodone 100 mg for insomnia. She was also enrolled & participated in the group counseling sessions being offered & held on this unit. She learned coping skills that should help her after discharge to cope better. She received other medication regimen for the other medical issues presented.  Dawn Foley was seen today by her attending psychiatrist. Reports thats she is in good spirits. Not feeling depressed. Reports normal energy and interest. Has been maintaining normal biological functions. She is able to think clearly. She is able to focus on task. Her thoughts are not crowded or racing. No evidence of mania. No hallucination in any modality. She is not making any delusional statement. No passivity of will/thought. She is fully in touch with reality. No thoughts of suicide. No thoughts of homicide. No violent thoughts. No overwhelming anxiety.  Nursing staff reports that patient has been appropriate on the unit. Patient has been interacting well with peers. No behavioral issues. Patient has not voiced any suicidal thoughts. Patient has not been observed to be internally stimulated. Patient has been adherent with treatment recommendations. Patient has been tolerating their medication well.   Patient was discussed at team. Team members feels that patient is back to her baseline level of function. Team agrees with plan to discharge patient today for Dawn Foley to continue mental care on outpatient basis as noted below. She was provided with all the necessary information needed to  make this appointment without problems. She received a 7 days worth supple  samples of her [facility] discharge medications. She left [facility] with all personal belongings in no apparent distress. Transportation per family.  Consults:  psychiatry  Discharge Vitals:   Blood pressure 122/74, pulse (!) 116, temperature 98.4 F (36.9 C), temperature source Oral, resp. rate (!) 116, height 5\' 3"  (1.6 m), weight 73.5 kg (162 lb), last menstrual period 01/28/2017. Body mass index is 28.7 kg/m.  Lab Results:   No results found for this or any previous visit (from the past 72 hour(s)). Physical Findings: AIMS: Facial and Oral Movements Muscles of Facial Expression: None, normal Lips and Perioral Area: None, normal Jaw: None, normal Tongue: None, normal,Extremity Movements Upper (arms, wrists, hands, fingers): None, normal Lower (legs, knees, ankles, toes): None, normal, Trunk Movements Neck, shoulders, hips: None, normal, Overall Severity Severity of abnormal movements (highest score from questions above): None, normal Incapacitation due to abnormal movements: None, normal Patient's awareness of abnormal movements (rate only patient's report): No Awareness, Dental Status Current problems with teeth and/or dentures?: No Does patient usually wear dentures?: No  CIWA:  CIWA-Ar Total: 5 COWS:     Psychiatric Specialty Exam: See Psychiatric Specialty Exam and Suicide Risk Assessment completed by Attending Physician prior to discharge.  Discharge destination:  Home  Is patient on multiple antipsychotic therapies at discharge:  No   Has Patient had three or more failed trials of antipsychotic monotherapy by history:  No  Recommended Plan for Multiple Antipsychotic Therapies: NA  Allergies as of 02/13/2017      Reactions   Ondansetron Nausea And Vomiting      Medication List    STOP taking these medications   folic acid 1 MG tablet Commonly known as:  FOLVITE   lip balm ointment   QUEtiapine 300 MG tablet Commonly known as:  SEROQUEL   tetrahydrozoline 0.05 %  ophthalmic solution     TAKE these medications     Indication  acamprosate 333 MG tablet Commonly known as:  CAMPRAL Take 2 tablets (666 mg total) by mouth 3 (three) times daily. For alcoholism  Indication:  Excessive Use of Alcohol   ARIPiprazole 10 MG tablet Commonly known as:  ABILIFY Take 1 tablet (10 mg total) by mouth at bedtime. For mood control  Indication:  Mood control   benzocaine 10 % mucosal gel Commonly known as:  ORAJEL Use as directed in the mouth or throat 3 (three) times daily as needed for mouth pain.  Indication:  Oral pain   carbamazepine 200 MG tablet Commonly known as:  TEGRETOL Take 1 tablet (200 mg total) by mouth 4 (four) times daily. For mood stabilization What changed:  additional instructions  Indication:  Mood stabilization   cephALEXin 500 MG capsule Commonly known as:  KEFLEX Take 1 capsule (500 mg total) by mouth every 8 (eight) hours. For oral infection  Indication:  Infection of the Skin and/or Skin Structures   gabapentin 300 MG capsule Commonly known as:  NEURONTIN Take 1 capsule (300 mg total) by mouth 3 (three) times daily. For agitation What changed:  additional instructions  Indication:  Agitation   hydrOXYzine 25 MG tablet Commonly known as:  ATARAX/VISTARIL Take 1 tablet (25 mg total) by mouth every 6 (six) hours as needed for anxiety. What changed:  medication strength  how much to take  when to take this  Indication:  Anxiety Neurosis   ibuprofen 800 MG tablet Commonly known as:  ADVIL,MOTRIN Take  1 tablet (800 mg total) by mouth every 6 (six) hours as needed. For pain What changed:  additional instructions  Indication:  Pain   levothyroxine 75 MCG tablet Commonly known as:  SYNTHROID, LEVOTHROID Take 1 tablet (75 mcg total) by mouth daily before breakfast. For low thyroid hormone replacement Start taking on:  02/14/2017 What changed:  additional instructions  Indication:  Underactive Thyroid   multivitamin with  minerals Tabs tablet Take 1 tablet by mouth daily. Vitamin supplement Start taking on:  02/14/2017 What changed:  additional instructions  Indication:  Vitamin supplement   naphazoline-glycerin 0.012-0.2 % Soln Commonly known as:  CLEAR EYES Place 1-2 drops into both eyes 4 (four) times daily as needed for irritation.  Indication:  Red Eyes   nicotine 21 mg/24hr patch Commonly known as:  NICODERM CQ - dosed in mg/24 hours Place 1 patch (21 mg total) onto the skin daily. For smoking cessation Start taking on:  02/14/2017  Indication:  Nicotine Addiction   thiamine 100 MG tablet Take 1 tablet (100 mg total) by mouth daily. For low thiamine Start taking on:  02/14/2017 What changed:  additional instructions  Indication:  Deficiency in Thiamine or Vitamin B1   traZODone 100 MG tablet Commonly known as:  DESYREL Take 1 tablet (100 mg total) by mouth at bedtime as needed for sleep.  Indication:  [place] Follow up on 02/15/2017.   Why:  Appt on Friday at 9:45AM for hospital follow-up. Please bring: Medicaid card. Thank you.  Contact information: [place] [address] [phone]        ADS Follow up.   Why:  Walk in within 7 days of hospital discharge to be assessed for Substance Abuse Intensive Outpatient Program/Counseling services. Walk-in Triage hours: Monday & Wednesday 12pm-2pm and Friday 8am-10am. Thank you.  Contact information: [address] [place], [place] [address] Phone: [phone] Fax: [phone]         Follow-up recommendations:  Activity:  As tolerated Diet: As recommended by your primary care doctor. Keep all scheduled follow-up appointments as recommended.   Comments:  Patient is instructed prior to discharge to: Take all medications as prescribed by his/her mental healthcare provider. Report any adverse effects and or reactions from the medicines to his/her  outpatient provider promptly. Patient has been instructed & cautioned: To not engage in alcohol and or illegal drug use while on prescription medicines. In the event of worsening symptoms, patient is instructed to call the crisis hotline, 911 and or go to the nearest ED for appropriate evaluation and treatment of symptoms. To follow-up with his/her primary care provider for your other medical issues, concerns and or health care needs.   Signed: Encarnacion Slates, FNP, PMHNP-BC 02/13/2017, 10:47 AM

## 2017-02-13 NOTE — Progress Notes (Signed)
Adult Psychoeducational Group Note  Date:  02/13/2017 Time:  5:54 AM  Group Topic/Focus:  Wrap-Up Group:   The focus of this group is to help patients review their daily goal of treatment and discuss progress on daily workbooks.  Participation Level:  Active  Participation Quality:  Appropriate, Attentive, Sharing and Supportive  Affect:  Appropriate  Cognitive:  Appropriate  Insight: Appropriate and Good  Engagement in Group:  Developing/Improving  Modes of Intervention:  Discussion, Socialization and Support  Additional Comments:  Pt shared in group her goal was to focus on improving anxiety. Pt shared she accomplished her goal.  Abe People Brittini 02/13/2017, 5:54 AM

## 2017-02-13 NOTE — Progress Notes (Signed)
  [facility] Adult Case Management Discharge Plan :  Will you be returning to the same living situation after discharge:  Yes,  home with dad At discharge, do you have transportation home?: Yes,  parent Do you have the ability to pay for your medications: Yes,  [facility]  Release of information consent forms completed and submitted to medical records by CSW>  Patient to Follow up at: Follow-up [place], [facility] Follow up on 02/15/2017.   Why:  Appt on Friday at 9:45AM for hospital follow-up. Please bring: Medicaid card. Thank you.  Contact information: [place] [address] [phone]        ADS Follow up.   Why:  Walk in within 7 days of hospital discharge to be assessed for Substance Abuse Intensive Outpatient Program/Counseling services. Walk-in Triage hours: Monday & Wednesday 12pm-2pm and Friday 8am-10am. Thank you.  Contact information: [address] [place], [place] [address] Phone: [phone] Fax: [phone]          Next level of care provider has access to [place] and Suicide Prevention discussed: Yes,  SPE completed with pt; pt declined to consent to family contact.  Have you used any form of tobacco in the last 30 days? (Cigarettes, Smokeless Tobacco, Cigars, and/or Pipes): Yes  Has patient been referred to the Quitline?: Patient refused referral  Patient has been referred for addiction treatment: Yes  Izaiyah Kleinman N Smart LCSW 02/13/2017, 9:10 AM

## 2017-02-13 NOTE — Progress Notes (Signed)
Recreation Therapy Notes  Date: 02/13/17 Time: 0930 Location: [address] Dayroom  Group Topic: Stress Management  Goal Area(s) Addresses:  Patient will verbalize importance of using healthy stress management.  Patient will identify positive emotions associated with healthy stress management.   Intervention: Stress Management  Activity :  Progressive Muscle Relaxation.  LRT introduced the stress management technique of progressive muscle relaxation.  LRT read a script to allow patients to participate in PMR which allowed them to tense and relax each muscle group individually.  Patients were to follow along with the script to fully engage in the activity.  Education:  Stress Management, Discharge Planning.   Education Outcome: Acknowledges edcuation/In group clarification offered/Needs additional education  Clinical Observations/Feedback: Pt did not attend group.   Victorino Sparrow, LRT/CTRS         Ria Comment, Jeb Schloemer A 02/13/2017 11:45 AM

## 2017-04-17 DIAGNOSIS — F1010 Alcohol abuse, uncomplicated: Secondary | ICD-10-CM

## 2017-04-17 DIAGNOSIS — E039 Hypothyroidism, unspecified: Secondary | ICD-10-CM

## 2017-04-17 DIAGNOSIS — E669 Obesity, unspecified: Secondary | ICD-10-CM

## 2017-04-17 DIAGNOSIS — T79A21A Traumatic compartment syndrome of right lower extremity, initial encounter: Secondary | ICD-10-CM

## 2017-04-17 DIAGNOSIS — F10229 Alcohol dependence with intoxication, unspecified: Secondary | ICD-10-CM

## 2017-04-23 DIAGNOSIS — F10121 Alcohol abuse with intoxication delirium: Secondary | ICD-10-CM

## 2017-06-01 DIAGNOSIS — E872 Acidosis: Secondary | ICD-10-CM

## 2017-06-01 DIAGNOSIS — E039 Hypothyroidism, unspecified: Secondary | ICD-10-CM

## 2017-06-01 DIAGNOSIS — F1010 Alcohol abuse, uncomplicated: Secondary | ICD-10-CM

## 2017-06-01 DIAGNOSIS — F10239 Alcohol dependence with withdrawal, unspecified: Secondary | ICD-10-CM

## 2017-06-01 DIAGNOSIS — T79A21A Traumatic compartment syndrome of right lower extremity, initial encounter: Secondary | ICD-10-CM

## 2017-06-01 DIAGNOSIS — Z720 Tobacco use: Secondary | ICD-10-CM

## 2017-06-01 DIAGNOSIS — F1020 Alcohol dependence, uncomplicated: Secondary | ICD-10-CM

## 2017-06-01 DIAGNOSIS — R748 Abnormal levels of other serum enzymes: Secondary | ICD-10-CM

## 2017-06-01 DIAGNOSIS — S82141A Displaced bicondylar fracture of right tibia, initial encounter for closed fracture: Secondary | ICD-10-CM

## 2017-06-13 NOTE — Pre-Procedure Instructions (Signed)
Dawn Foley  06/13/2017      [facility] - [place], [place] [address] Phone: [phone] Fax: [phone]  [facility] [place], [place] AT [place] [address] Phone: [phone] Fax: [phone]  [facility] [address] [place] - [place] [address] [place] [address] Phone: [phone] Fax: [phone]    Your procedure is scheduled on June 18, 2017.  Report to [facility] Admitting at 600 AM.  Call this number if you have problems the morning of surgery:  [phone]   Remember:  Do not eat food or drink liquids after midnight.  Take these medicines the morning of surgery with A SIP OF WATER methocarbamol (robaxin)-if needed for muscle spasms, tramadol (ultram)-If needed for pain, eye drops-if needed  7 days prior to surgery STOP taking any Aspirin (unless otherwise instructed by your surgeon), Aleve, Naproxen, Ibuprofen, Motrin, Advil, Goody's, BC's, all herbal medications, fish oil, and all vitamins   Do not wear jewelry, make-up or nail polish.  Do not wear lotions, powders, or perfumes, or deoderant.  Do not shave 48 hours prior to surgery.    Do not bring valuables to the hospital.  [facility] is not responsible for any belongings or valuables.  Contacts, dentures or bridgework may not be worn into surgery.  Leave your suitcase in the car.  After surgery it may be brought to your room.  For patients admitted to the hospital, discharge time will be determined by your treatment team.  Patients discharged the day of surgery will not be allowed to drive home.   Special instructions:   [place]- Preparing For Surgery  Before surgery, you can play an important role. Because skin is not sterile, your skin needs to be as free of germs as possible. You can reduce the number of  germs on your skin by washing with CHG (chlorahexidine gluconate) Soap before surgery.  CHG is an antiseptic cleaner which kills germs and bonds with the skin to continue killing germs even after washing.  Please do not use if you have an allergy to CHG or antibacterial soaps. If your skin becomes reddened/irritated stop using the CHG.  Do not shave (including legs and underarms) for at least 48 hours prior to first CHG shower. It is OK to shave your face.  Please follow these instructions carefully.   1. Shower the NIGHT BEFORE SURGERY and the MORNING OF SURGERY with CHG.   2. If you chose to wash your hair, wash your hair first as usual with your normal shampoo.  3. After you shampoo, rinse your hair and body thoroughly to remove the shampoo.  4. Use CHG as you would any other liquid soap. You can apply CHG directly to the skin and wash gently with a scrungie or a clean washcloth.   5. Apply the CHG Soap to your body ONLY FROM THE NECK DOWN.  Do not use on open wounds or open sores. Avoid contact with your eyes, ears, mouth and genitals (private parts). Wash genitals (private parts) with your normal soap.  USE REGULAR SHAMPOO AND CONDITIONER FOR HAIR USE REGULAR SOAP FOR FACE AND PRIVATE AREA  6. Wash thoroughly, paying special attention to the area where your surgery will be performed.  7. Thoroughly rinse your body with warm water from the  neck down.  8. DO NOT shower/wash with your normal soap after using and rinsing off the CHG Soap.  9. Pat yourself dry with a CLEAN TOWEL and [place] CLOTH  10. Wear CLEAN PAJAMAS to bed the night before surgery, wear comfortable clothes the morning of surgery  11. Place CLEAN SHEETS on your bed the night of your first shower and DO NOT SLEEP WITH PETS.    Day of Surgery: Do not apply any deodorants/lotions. Please wear clean clothes to the hospital/surgery center.    Please read over the following fact sheets that you were given. Pain  Booklet, Coughing and Deep Breathing, MRSA Information and Surgical Site Infection Prevention

## 2017-06-13 NOTE — Progress Notes (Addendum)
Called and spoke with patient at 925 am after she failed to show for her 915 am appointment. .  She reported that she was 30+ minutes away and would be late.  This is her 3rd time rescheduling.  I advised Baker Janus of the above information and she advised she had openings on Monday and would try to get her in before her surgery.

## 2017-06-14 ENCOUNTER — Inpatient Hospital Stay (HOSPITAL_COMMUNITY)
Admission: RE | Admit: 2017-06-14 | Discharge: 2017-06-14 | Disposition: A | Discharge status: Home / Self Care | Payer: Self-pay | Source: Ambulatory Visit

## 2017-06-17 ENCOUNTER — Encounter (HOSPITAL_COMMUNITY): Payer: Self-pay

## 2017-06-17 ENCOUNTER — Encounter (HOSPITAL_COMMUNITY)
Admission: RE | Admit: 2017-06-17 | Discharge: 2017-06-17 | Disposition: A | Discharge status: Home / Self Care | Payer: Medicaid Other | Source: Ambulatory Visit | Attending: Orthopedic Surgery | Admitting: Orthopedic Surgery

## 2017-06-17 DIAGNOSIS — E039 Hypothyroidism, unspecified: Secondary | ICD-10-CM | POA: Insufficient documentation

## 2017-06-17 DIAGNOSIS — Z01818 Encounter for other preprocedural examination: Secondary | ICD-10-CM | POA: Insufficient documentation

## 2017-06-17 DIAGNOSIS — F319 Bipolar disorder, unspecified: Secondary | ICD-10-CM | POA: Insufficient documentation

## 2017-06-17 DIAGNOSIS — F17200 Nicotine dependence, unspecified, uncomplicated: Secondary | ICD-10-CM | POA: Insufficient documentation

## 2017-06-17 DIAGNOSIS — R011 Cardiac murmur, unspecified: Secondary | ICD-10-CM | POA: Insufficient documentation

## 2017-06-17 DIAGNOSIS — F1020 Alcohol dependence, uncomplicated: Secondary | ICD-10-CM | POA: Insufficient documentation

## 2017-06-17 DIAGNOSIS — D649 Anemia, unspecified: Secondary | ICD-10-CM | POA: Insufficient documentation

## 2017-06-17 DIAGNOSIS — F209 Schizophrenia, unspecified: Secondary | ICD-10-CM | POA: Insufficient documentation

## 2017-06-17 DIAGNOSIS — Z01812 Encounter for preprocedural laboratory examination: Secondary | ICD-10-CM | POA: Insufficient documentation

## 2017-06-17 DIAGNOSIS — J45909 Unspecified asthma, uncomplicated: Secondary | ICD-10-CM | POA: Insufficient documentation

## 2017-06-17 HISTORY — DX: Hypothyroidism, unspecified: E03.9

## 2017-06-17 HISTORY — DX: Unspecified osteoarthritis, unspecified site: M19.90

## 2017-06-17 HISTORY — DX: Cardiac murmur, unspecified: R01.1

## 2017-06-17 HISTORY — DX: Schizophrenia, unspecified: F20.9

## 2017-06-17 HISTORY — DX: Abnormal levels of other serum enzymes: R74.8

## 2017-06-17 HISTORY — DX: Malignant (primary) neoplasm, unspecified: C80.1

## 2017-06-17 HISTORY — DX: Unspecified asthma, uncomplicated: J45.909

## 2017-06-17 HISTORY — DX: Anxiety disorder, unspecified: F41.9

## 2017-06-17 HISTORY — DX: Displaced bicondylar fracture of right tibia, initial encounter for closed fracture: S82.141A

## 2017-06-17 HISTORY — DX: Unspecified convulsions: R56.9

## 2017-06-17 HISTORY — DX: Bipolar disorder, unspecified: F31.9

## 2017-06-17 HISTORY — DX: Anemia, unspecified: D64.9

## 2017-06-17 HISTORY — DX: Other complications of anesthesia, initial encounter: T88.59XA

## 2017-06-17 HISTORY — DX: Adverse effect of unspecified anesthetic, initial encounter: T41.45XA

## 2017-06-17 LAB — URINALYSIS, ROUTINE W REFLEX MICROSCOPIC
Bilirubin Urine: NEGATIVE
Glucose, UA: NEGATIVE mg/dL
HGB URINE DIPSTICK: NEGATIVE
Ketones, ur: NEGATIVE mg/dL
LEUKOCYTES UA: NEGATIVE
Nitrite: NEGATIVE
PROTEIN: NEGATIVE mg/dL
Specific Gravity, Urine: 1.01 (ref 1.005–1.030)
pH: 5 (ref 5.0–8.0)

## 2017-06-17 LAB — RAPID URINE DRUG SCREEN, HOSP PERFORMED
Amphetamines: NOT DETECTED
BENZODIAZEPINES: POSITIVE — AB
Barbiturates: NOT DETECTED
COCAINE: NOT DETECTED
OPIATES: POSITIVE — AB
Tetrahydrocannabinol: NOT DETECTED

## 2017-06-17 LAB — CBC WITH DIFFERENTIAL/PLATELET
BASOS PCT: 1 %
Basophils Absolute: 0 10*3/uL (ref 0.0–0.1)
EOS PCT: 3 %
Eosinophils Absolute: 0.2 10*3/uL (ref 0.0–0.7)
HEMATOCRIT: 34.9 % — AB (ref 36.0–46.0)
Hemoglobin: 10.3 g/dL — ABNORMAL LOW (ref 12.0–15.0)
LYMPHS PCT: 29 %
Lymphs Abs: 1.6 10*3/uL (ref 0.7–4.0)
MCH: 23.3 pg — ABNORMAL LOW (ref 26.0–34.0)
MCHC: 29.5 g/dL — AB (ref 30.0–36.0)
MCV: 78.8 fL (ref 78.0–100.0)
MONO ABS: 0.5 10*3/uL (ref 0.1–1.0)
MONOS PCT: 9 %
NEUTROS ABS: 3.2 10*3/uL (ref 1.7–7.7)
Neutrophils Relative %: 58 %
PLATELETS: 405 10*3/uL — AB (ref 150–400)
RBC: 4.43 MIL/uL (ref 3.87–5.11)
RDW: 19.5 % — AB (ref 11.5–15.5)
WBC: 5.5 10*3/uL (ref 4.0–10.5)

## 2017-06-17 LAB — COMPREHENSIVE METABOLIC PANEL
ALT: 19 U/L (ref 14–54)
ANION GAP: 11 (ref 5–15)
AST: 25 U/L (ref 15–41)
Albumin: 3.5 g/dL (ref 3.5–5.0)
Alkaline Phosphatase: 53 U/L (ref 38–126)
BILIRUBIN TOTAL: 0.3 mg/dL (ref 0.3–1.2)
BUN: 5 mg/dL — ABNORMAL LOW (ref 6–20)
CO2: 26 mmol/L (ref 22–32)
Calcium: 9.3 mg/dL (ref 8.9–10.3)
Chloride: 98 mmol/L — ABNORMAL LOW (ref 101–111)
Creatinine, Ser: 0.53 mg/dL (ref 0.44–1.00)
Glucose, Bld: 66 mg/dL (ref 65–99)
POTASSIUM: 3.6 mmol/L (ref 3.5–5.1)
Sodium: 135 mmol/L (ref 135–145)
TOTAL PROTEIN: 7.5 g/dL (ref 6.5–8.1)

## 2017-06-17 LAB — ETHANOL: Alcohol, Ethyl (B): <10 mg/dL (ref <10)

## 2017-06-17 LAB — HCG, SERUM, QUALITATIVE: PREG SERUM: NEGATIVE

## 2017-06-17 NOTE — Progress Notes (Signed)
Anesthesia Chart Review:  Pt is a 31 year old female scheduled for removal R external fixator, closed manipulation of R knee on 06/18/2017 with Altamese Jette, MD.   PMH includes:  Heart murmur, asthma, schizophrenia, alcoholism, bipolar disorder, hypothroidism, anemia, hepatitis. Current smoker. BMI 30  Medications reviewed: pt not taking most of the meds on her list.   BP (!) 107/59   Pulse 98   Temp (!) 36.4 C   Resp 20   Ht 5' 4.5" (1.638 m)   Wt 175 lb 9.6 oz (79.7 kg)   LMP 06/11/2017 (Exact Date)   SpO2 99%   BMI 29.68 kg/m    Preoperative labs reviewed.    EKG 09/04/16: Sinus tachycardia (111 bpm). Cannot rule out prior inferior infarct. Appears stable when compared to EKG 06/03/16.   If no changes, I anticipate pt can proceed with surgery as scheduled.   Willeen Cass, FNP-BC [facility] Short Stay Surgical Center/Anesthesiology Phone: [phone] 06/17/2017 1:46 PM

## 2017-06-17 NOTE — Pre-Procedure Instructions (Signed)
Dawn Foley  06/17/2017      [facility] - [place], [place] [address] Phone: [phone] Fax: [phone]  [facility] [place], [place] AT [place] [address] Phone: [phone] Fax: [phone]  [facility] [address], [place] [address] [place] [address] Phone: [phone] Fax: [phone]    Your procedure is scheduled on Tuesday, June 18, 2017  Report to [place] at 6:00 A.M.  Call this number if you have problems the morning of surgery:  [phone]   Remember:  Do not eat food or drink liquids after midnight.  Take these medicines the morning of surgery with A SIP OF WATER : if needed: pain medication ( Tramadol   OR   HYDROmorphone ), CLEAR EYES for irritataion Stop taking Aspirin, vitamins, fish oil and herbal medications. Do not take any NSAIDs ie: Ibuprofen, Advil, Naproxen (Aleve), Motrin, BC and Goody Powder; stop now.  Do not wear jewelry, make-up or nail polish.  Do not wear lotions, powders, or perfumes, or deodorant.  Do not shave 48 hours prior to surgery.  Do not bring valuables to the hospital.  [facility] is not responsible for any belongings or valuables.  Contacts, dentures or bridgework may not be worn into surgery.  Leave your suitcase in the car.  After surgery it may be brought to your room. For patients admitted to the hospital, discharge time will be determined by your treatment team. Patients discharged the day of surgery will not be allowed to drive home.   Special instructions:   [place] - Preparing for Surgery  Before surgery, you can play an important role.  Because skin is not sterile, your skin needs to be as free of germs as possible.  You can reduce the number of germs on you skin by washing with CHG (chlorahexidine  gluconate) soap before surgery.  CHG is an antiseptic cleaner which kills germs and bonds with the skin to continue killing germs even after washing.  Please DO NOT use if you have an allergy to CHG or antibacterial soaps.  If your skin becomes reddened/irritated stop using the CHG and inform your nurse when you arrive at Short Stay.  Do not shave (including legs and underarms) for at least 48 hours prior to the first CHG shower.  You may shave your face.  Please follow these instructions carefully:   1.  Shower with CHG Soap the night before surgery and the morning of Surgery.  2.  If you choose to wash your hair, wash your hair first as usual with your normal shampoo.  3.  After you shampoo, rinse your hair and body thoroughly to remove the Shampoo.  4.  Use CHG as you would any other liquid soap.  You can apply chg directly  to the skin and wash gently with scrungie or a clean washcloth.  5.  Apply the CHG Soap to your body ONLY FROM THE NECK DOWN.  Do not use on open wounds or open sores.  Avoid contact with your eyes, ears, mouth and genitals (private parts).  Wash genitals (private parts) with your normal soap.  6.  Wash thoroughly, paying special attention to the area where your surgery will be performed.  7.  Thoroughly rinse your body with warm water  from the neck down.  8.  DO NOT shower/wash with your normal soap after using and rinsing off the CHG Soap.  9.  Pat yourself dry with a clean towel.            10.  Wear clean pajamas.            11.  Place clean sheets on your bed the night of your first shower and do not sleep with pets.  Day of Surgery  Do not apply any lotions/deodorants the morning of surgery.  Please wear clean clothes to the hospital/surgery center.  Please read over the following fact sheets that you were given. Pain Booklet, Coughing and Deep Breathing and Surgical Site Infection Prevention

## 2017-06-17 NOTE — Progress Notes (Signed)
Pt denies SOB, chest pain, and being under the care of a cardiologist. Pt denies having a stress test, echo and cardiac cath. Pt denies recent labs. Release of information form needs to be signed DOS to request chest x ray results and d/c summary from [facility] (now requesting). Anesthesia asked to review EKG ( see note).

## 2017-06-18 ENCOUNTER — Encounter (HOSPITAL_COMMUNITY): Payer: Self-pay | Admitting: Orthopedic Surgery

## 2017-06-18 ENCOUNTER — Encounter (HOSPITAL_COMMUNITY)
Admission: RE | Disposition: A | Discharge status: Home / Self Care | Payer: Self-pay | Source: Ambulatory Visit | Attending: Orthopedic Surgery

## 2017-06-18 ENCOUNTER — Ambulatory Visit (HOSPITAL_COMMUNITY): Payer: Self-pay | Admitting: Anesthesiology

## 2017-06-18 ENCOUNTER — Ambulatory Visit (HOSPITAL_COMMUNITY)
Admission: RE | Admit: 2017-06-18 | Discharge: 2017-06-18 | Disposition: A | Discharge status: Home / Self Care | Payer: Self-pay | Source: Ambulatory Visit | Attending: Orthopedic Surgery | Admitting: Orthopedic Surgery

## 2017-06-18 ENCOUNTER — Ambulatory Visit (HOSPITAL_COMMUNITY): Payer: Self-pay

## 2017-06-18 ENCOUNTER — Ambulatory Visit (HOSPITAL_COMMUNITY): Payer: Self-pay | Admitting: Emergency Medicine

## 2017-06-18 DIAGNOSIS — Z811 Family history of alcohol abuse and dependence: Secondary | ICD-10-CM | POA: Insufficient documentation

## 2017-06-18 DIAGNOSIS — E039 Hypothyroidism, unspecified: Secondary | ICD-10-CM | POA: Insufficient documentation

## 2017-06-18 DIAGNOSIS — Z8349 Family history of other endocrine, nutritional and metabolic diseases: Secondary | ICD-10-CM | POA: Insufficient documentation

## 2017-06-18 DIAGNOSIS — L98496 Non-pressure chronic ulcer of skin of other sites with bone involvement without evidence of necrosis: Secondary | ICD-10-CM | POA: Insufficient documentation

## 2017-06-18 DIAGNOSIS — M1990 Unspecified osteoarthritis, unspecified site: Secondary | ICD-10-CM | POA: Insufficient documentation

## 2017-06-18 DIAGNOSIS — Z818 Family history of other mental and behavioral disorders: Secondary | ICD-10-CM | POA: Insufficient documentation

## 2017-06-18 DIAGNOSIS — Z419 Encounter for procedure for purposes other than remedying health state, unspecified: Secondary | ICD-10-CM

## 2017-06-18 DIAGNOSIS — R011 Cardiac murmur, unspecified: Secondary | ICD-10-CM | POA: Insufficient documentation

## 2017-06-18 DIAGNOSIS — D649 Anemia, unspecified: Secondary | ICD-10-CM | POA: Insufficient documentation

## 2017-06-18 DIAGNOSIS — F319 Bipolar disorder, unspecified: Secondary | ICD-10-CM | POA: Insufficient documentation

## 2017-06-18 DIAGNOSIS — F1020 Alcohol dependence, uncomplicated: Secondary | ICD-10-CM | POA: Insufficient documentation

## 2017-06-18 DIAGNOSIS — B1920 Unspecified viral hepatitis C without hepatic coma: Secondary | ICD-10-CM | POA: Insufficient documentation

## 2017-06-18 DIAGNOSIS — F419 Anxiety disorder, unspecified: Secondary | ICD-10-CM | POA: Insufficient documentation

## 2017-06-18 DIAGNOSIS — Z79899 Other long term (current) drug therapy: Secondary | ICD-10-CM | POA: Insufficient documentation

## 2017-06-18 DIAGNOSIS — F17210 Nicotine dependence, cigarettes, uncomplicated: Secondary | ICD-10-CM | POA: Insufficient documentation

## 2017-06-18 DIAGNOSIS — Z85038 Personal history of other malignant neoplasm of large intestine: Secondary | ICD-10-CM | POA: Insufficient documentation

## 2017-06-18 DIAGNOSIS — X58XXXD Exposure to other specified factors, subsequent encounter: Secondary | ICD-10-CM | POA: Insufficient documentation

## 2017-06-18 DIAGNOSIS — R569 Unspecified convulsions: Secondary | ICD-10-CM | POA: Insufficient documentation

## 2017-06-18 DIAGNOSIS — T79A0XA Compartment syndrome, unspecified, initial encounter: Secondary | ICD-10-CM | POA: Insufficient documentation

## 2017-06-18 DIAGNOSIS — S82141D Displaced bicondylar fracture of right tibia, subsequent encounter for closed fracture with routine healing: Secondary | ICD-10-CM | POA: Insufficient documentation

## 2017-06-18 DIAGNOSIS — Z888 Allergy status to other drugs, medicaments and biological substances status: Secondary | ICD-10-CM | POA: Insufficient documentation

## 2017-06-18 DIAGNOSIS — F209 Schizophrenia, unspecified: Secondary | ICD-10-CM | POA: Insufficient documentation

## 2017-06-18 HISTORY — PX: KNEE CLOSED REDUCTION: SHX995

## 2017-06-18 HISTORY — PX: HARDWARE REMOVAL: SHX979

## 2017-06-18 HISTORY — DX: Unspecified viral hepatitis C without hepatic coma: B19.20

## 2017-06-18 HISTORY — DX: Nicotine dependence, unspecified, uncomplicated: F17.200

## 2017-06-18 SURGERY — REMOVAL, HARDWARE
Anesthesia: General | Site: Leg Lower | Laterality: Right

## 2017-06-18 MED ORDER — DEXMEDETOMIDINE HCL IN NACL 80 MCG/20ML IV SOLN
INTRAVENOUS | Status: DC
  Filled 2017-06-18: qty 20

## 2017-06-18 MED ORDER — HYDROMORPHONE HCL 4 MG PO TABS: 4.0000 mg | ORAL_TABLET | Freq: Two times a day (BID) | ORAL | 0 refills | Status: DC | PRN

## 2017-06-18 MED ORDER — ROCURONIUM BROMIDE 100 MG/10ML IV SOLN
INTRAVENOUS | Status: DC | PRN
  Administered 2017-06-18: 50 mg via INTRAVENOUS

## 2017-06-18 MED ORDER — CEFAZOLIN SODIUM-DEXTROSE 2-4 GM/100ML-% IV SOLN
2.0000 g | INTRAVENOUS | Status: AC
  Administered 2017-06-18: 2 g via INTRAVENOUS

## 2017-06-18 MED ORDER — OXYCODONE HCL 5 MG/5ML PO SOLN: 5.0000 mg | Freq: Once | ORAL | Status: AC | PRN

## 2017-06-18 MED ORDER — LACTATED RINGERS IV SOLN
INTRAVENOUS | Status: DC | PRN
  Administered 2017-06-18: 07:00:00 via INTRAVENOUS

## 2017-06-18 MED ORDER — OXYCODONE HCL 5 MG PO TABS
5.0000 mg | ORAL_TABLET | Freq: Once | ORAL | Status: AC | PRN
  Administered 2017-06-18: 5 mg via ORAL

## 2017-06-18 MED ORDER — LIDOCAINE 2% (20 MG/ML) 5 ML SYRINGE
INTRAMUSCULAR | Status: AC
  Filled 2017-06-18: qty 5

## 2017-06-18 MED ORDER — SUGAMMADEX SODIUM 200 MG/2ML IV SOLN
INTRAVENOUS | Status: DC | PRN
  Administered 2017-06-18: 160 mg via INTRAVENOUS

## 2017-06-18 MED ORDER — OXYCODONE HCL 5 MG PO TABS
ORAL_TABLET | ORAL | Status: AC
  Administered 2017-06-18: 5 mg via ORAL
  Filled 2017-06-18: qty 1

## 2017-06-18 MED ORDER — SUGAMMADEX SODIUM 200 MG/2ML IV SOLN
INTRAVENOUS | Status: AC
  Filled 2017-06-18: qty 2

## 2017-06-18 MED ORDER — METOCLOPRAMIDE HCL 5 MG/ML IJ SOLN: 10.0000 mg | Freq: Once | INTRAMUSCULAR | Status: DC | PRN

## 2017-06-18 MED ORDER — 0.9 % SODIUM CHLORIDE (POUR BTL) OPTIME
TOPICAL | Status: DC | PRN
Start: 2017-06-18 — End: 2017-06-18
  Administered 2017-06-18: 1000 mL

## 2017-06-18 MED ORDER — MIDAZOLAM HCL 5 MG/5ML IJ SOLN
INTRAMUSCULAR | Status: DC | PRN
  Administered 2017-06-18: 2 mg via INTRAVENOUS

## 2017-06-18 MED ORDER — HYDROMORPHONE HCL 1 MG/ML IJ SOLN
INTRAMUSCULAR | Status: AC
  Administered 2017-06-18: 0.5 mg via INTRAVENOUS
  Filled 2017-06-18: qty 1

## 2017-06-18 MED ORDER — ROCURONIUM BROMIDE 10 MG/ML (PF) SYRINGE
PREFILLED_SYRINGE | INTRAVENOUS | Status: AC
  Filled 2017-06-18: qty 5

## 2017-06-18 MED ORDER — HYDROMORPHONE HCL 1 MG/ML IJ SOLN
0.2500 mg | INTRAMUSCULAR | Status: DC | PRN
  Administered 2017-06-18: 1 mg via INTRAVENOUS
  Administered 2017-06-18 (×2): 0.5 mg via INTRAVENOUS

## 2017-06-18 MED ORDER — HYDROMORPHONE HCL 1 MG/ML IJ SOLN
INTRAMUSCULAR | Status: AC
  Filled 2017-06-18: qty 1

## 2017-06-18 MED ORDER — FENTANYL CITRATE (PF) 100 MCG/2ML IJ SOLN
INTRAMUSCULAR | Status: DC | PRN
Start: 2017-06-18 — End: 2017-06-18
  Administered 2017-06-18 (×2): 50 ug via INTRAVENOUS
  Administered 2017-06-18: 100 ug via INTRAVENOUS
  Administered 2017-06-18: 50 ug via INTRAVENOUS

## 2017-06-18 MED ORDER — PROPOFOL 10 MG/ML IV BOLUS
INTRAVENOUS | Status: DC | PRN
  Administered 2017-06-18: 20 mg via INTRAVENOUS
  Administered 2017-06-18: 180 mg via INTRAVENOUS

## 2017-06-18 MED ORDER — CEFAZOLIN SODIUM-DEXTROSE 2-4 GM/100ML-% IV SOLN
INTRAVENOUS | Status: AC
  Filled 2017-06-18: qty 100

## 2017-06-18 MED ORDER — CHLORHEXIDINE GLUCONATE 4 % EX LIQD: 60.0000 mL | Freq: Once | CUTANEOUS | Status: DC

## 2017-06-18 SURGICAL SUPPLY — 61 items
BANDAGE ACE 4X5 VEL STRL LF (GAUZE/BANDAGES/DRESSINGS) ×3 IMPLANT
BANDAGE ACE 6X5 VEL STRL LF (GAUZE/BANDAGES/DRESSINGS) ×3 IMPLANT
BANDAGE ELASTIC 4 VELCRO ST LF (GAUZE/BANDAGES/DRESSINGS) ×2 IMPLANT
BANDAGE ELASTIC 6 VELCRO ST LF (GAUZE/BANDAGES/DRESSINGS) ×2 IMPLANT
BANDAGE ESMARK 6X9 LF (GAUZE/BANDAGES/DRESSINGS) ×1 IMPLANT
BNDG CMPR 9X6 STRL LF SNTH (GAUZE/BANDAGES/DRESSINGS)
BNDG COHESIVE 6X5 TAN STRL LF (GAUZE/BANDAGES/DRESSINGS) ×1 IMPLANT
BNDG ESMARK 6X9 LF (GAUZE/BANDAGES/DRESSINGS)
BNDG GAUZE ELAST 4 BULKY (GAUZE/BANDAGES/DRESSINGS) ×6 IMPLANT
BRUSH SCRUB SURG 4.25 DISP (MISCELLANEOUS) ×6 IMPLANT
CLOSURE WOUND 1/2 X4 (GAUZE/BANDAGES/DRESSINGS)
COVER SURGICAL LIGHT HANDLE (MISCELLANEOUS) ×6 IMPLANT
CUFF TOURNIQUET SINGLE 18IN (TOURNIQUET CUFF) IMPLANT
CUFF TOURNIQUET SINGLE 24IN (TOURNIQUET CUFF) IMPLANT
CUFF TOURNIQUET SINGLE 34IN LL (TOURNIQUET CUFF) IMPLANT
DRAPE C-ARM 42X72 X-RAY (DRAPES) IMPLANT
DRAPE C-ARMOR (DRAPES) ×3 IMPLANT
DRAPE U-SHAPE 47X51 STRL (DRAPES) ×3 IMPLANT
DRSG ADAPTIC 3X8 NADH LF (GAUZE/BANDAGES/DRESSINGS) ×3 IMPLANT
ELECT REM PT RETURN 9FT ADLT (ELECTROSURGICAL) ×3
ELECTRODE REM PT RTRN 9FT ADLT (ELECTROSURGICAL) ×1 IMPLANT
GAUZE SPONGE 4X4 12PLY STRL (GAUZE/BANDAGES/DRESSINGS) ×3 IMPLANT
GAUZE SPONGE 4X4 12PLY STRL LF (GAUZE/BANDAGES/DRESSINGS) ×2 IMPLANT
GLOVE BIO SURGEON STRL SZ7.5 (GLOVE) ×3 IMPLANT
GLOVE BIO SURGEON STRL SZ8 (GLOVE) ×3 IMPLANT
GLOVE BIOGEL PI IND STRL 7.5 (GLOVE) ×1 IMPLANT
GLOVE BIOGEL PI IND STRL 8 (GLOVE) ×1 IMPLANT
GLOVE BIOGEL PI INDICATOR 7.5 (GLOVE) ×2
GLOVE BIOGEL PI INDICATOR 8 (GLOVE) ×2
GOWN STRL REUS W/ TWL LRG LVL3 (GOWN DISPOSABLE) ×2 IMPLANT
GOWN STRL REUS W/ TWL XL LVL3 (GOWN DISPOSABLE) ×1 IMPLANT
GOWN STRL REUS W/TWL LRG LVL3 (GOWN DISPOSABLE) ×6
GOWN STRL REUS W/TWL XL LVL3 (GOWN DISPOSABLE) ×3
KIT BASIN OR (CUSTOM PROCEDURE TRAY) ×3 IMPLANT
KIT ROOM TURNOVER OR (KITS) ×3 IMPLANT
MANIFOLD NEPTUNE II (INSTRUMENTS) ×3 IMPLANT
NEEDLE 22X1 1/2 (OR ONLY) (NEEDLE) IMPLANT
NS IRRIG 1000ML POUR BTL (IV SOLUTION) ×3 IMPLANT
PACK ORTHO EXTREMITY (CUSTOM PROCEDURE TRAY) ×3 IMPLANT
PAD ARMBOARD 7.5X6 YLW CONV (MISCELLANEOUS) ×6 IMPLANT
PADDING CAST COTTON 6X4 STRL (CAST SUPPLIES) ×9 IMPLANT
SPONGE LAP 18X18 X RAY DECT (DISPOSABLE) ×3 IMPLANT
STAPLER VISISTAT 35W (STAPLE) IMPLANT
STOCKINETTE IMPERVIOUS LG (DRAPES) ×3 IMPLANT
STRIP CLOSURE SKIN 1/2X4 (GAUZE/BANDAGES/DRESSINGS) IMPLANT
SUCTION FRAZIER HANDLE 10FR (MISCELLANEOUS)
SUCTION TUBE FRAZIER 10FR DISP (MISCELLANEOUS) IMPLANT
SUT ETHILON 3 0 PS 1 (SUTURE) IMPLANT
SUT PDS AB 2-0 CT1 27 (SUTURE) IMPLANT
SUT VIC AB 0 CT1 27 (SUTURE)
SUT VIC AB 0 CT1 27XBRD ANBCTR (SUTURE) IMPLANT
SUT VIC AB 2-0 CT1 27 (SUTURE)
SUT VIC AB 2-0 CT1 TAPERPNT 27 (SUTURE) IMPLANT
SYR CONTROL 10ML LL (SYRINGE) IMPLANT
TOWEL OR 17X24 6PK STRL BLUE (TOWEL DISPOSABLE) ×6 IMPLANT
TOWEL OR 17X26 10 PK STRL BLUE (TOWEL DISPOSABLE) ×6 IMPLANT
TUBE CONNECTING 12'X1/4 (SUCTIONS) ×1
TUBE CONNECTING 12X1/4 (SUCTIONS) ×2 IMPLANT
UNDERPAD 30X30 (UNDERPADS AND DIAPERS) ×3 IMPLANT
WATER STERILE IRR 1000ML POUR (IV SOLUTION) ×6 IMPLANT
YANKAUER SUCT BULB TIP NO VENT (SUCTIONS) ×3 IMPLANT

## 2017-06-18 NOTE — H&P (View-Only) (Signed)
Orthopaedic Trauma Service (OTS) Consult   Patient ID: Dawn Foley MRN: [id number] DOB/AGE: August 11, 1986 31 y.o.   Reason for Consult: retained ex fix R leg, R tibial plateau fracture    HPI: Dawn Foley is an 31 y.o. female with h/o EtOH abuse, hep c, depression, anxiety, s/p nicotine dependence who R tibial plateau fracture with acute compartment syndrome approximately 6 weeks or so from injury. Pt was treated at [facility] with ex fix and fasciotomies. She was referred to OTS for definitive treatment. Severe soft tissue swelling and wound complications around her fasciotomy sites precluded formal ORIF. Pt has also be admitted for detox as well during this period. Pt presents today for removal of ex fix and manipulation of R knee under anesthesia. She will likely require extensive reconstruction at a later date. Will also obtain CT after ex fix removal   Past Medical History:  Diagnosis Date  . Alcoholism ([place])   . Anemia   . Anxiety   . Arthritis   . Asthma    as a child  . Bipolar disorder ([place])   . Cancer ([place])    colon  . Complication of anesthesia    woke up during colonoscopy  . Depression   . Elevated liver enzymes   . Heart murmur    per [facility] per PT  . Hepatitis    denies  . Hepatitis C   . Hypothyroidism   . Schizophrenia ([place])   . Seizures ([place])   . Tibial plateau fracture, right     Past Surgical History:  Procedure Laterality Date  . APPENDECTOMY    . DILATION AND CURETTAGE OF UTERUS    . ESOPHAGOGASTRODUODENOSCOPY (EGD) WITH PROPOFOL N/A 09/04/2016   Procedure: ESOPHAGOGASTRODUODENOSCOPY (EGD) WITH PROPOFOL;  Surgeon: Doran Stabler, MD;  Location: [facility];  Service: Endoscopy;  Laterality: N/A;  . HERNIA REPAIR    . NO PAST SURGERIES    . ORIF TIBIA PLATEAU      Family History  Problem Relation Age of Onset  . Mental illness Other   . Thyroid disease Other   . Alcoholism Brother     Social History:  reports  that she has been smoking Cigarettes.  She has been smoking about 1.00 pack per day. She has never used smokeless tobacco. She reports that she drinks alcohol. She reports that she does not use drugs.  Allergies:  Allergies  Allergen Reactions  . Ondansetron Nausea And Vomiting    Medications:  I have reviewed the patient's current medications. Prior to Admission:  Current Meds  Medication Sig  . HYDROmorphone (DILAUDID) 4 MG tablet Take 8 mg by mouth every 4 (four) hours as needed for moderate pain or severe pain.  . methocarbamol (ROBAXIN) 500 MG tablet Take 1-2 tablets by mouth every 6 (six) hours as needed for muscle spasms.  . Multiple Vitamin (MULTIVITAMIN WITH MINERALS) TABS tablet Take 1 tablet by mouth daily. Vitamin supplement  . Multiple Vitamins-Minerals (MULTIPLE VITAMINS/WOMENS PO) Take 1 tablet by mouth daily.  . naphazoline-glycerin (CLEAR EYES) 0.012-0.2 % SOLN Place 1-2 drops into both eyes 4 (four) times daily as needed for irritation.  . traMADol (ULTRAM) 50 MG tablet Take 1-2 tablets by mouth every 8 (eight) hours as needed for breakthrough pain.     Results for orders placed or performed during the hospital encounter of 06/17/17 (from the past 48 hour(s))  Urine rapid drug screen (hosp performed)     Status: Abnormal  Collection Time: 06/17/17 10:23 AM  Result Value Ref Range   Opiates POSITIVE (A) NONE DETECTED   Cocaine NONE DETECTED NONE DETECTED   Benzodiazepines POSITIVE (A) NONE DETECTED   Amphetamines NONE DETECTED NONE DETECTED   Tetrahydrocannabinol NONE DETECTED NONE DETECTED   Barbiturates NONE DETECTED NONE DETECTED    Comment:        DRUG SCREEN FOR MEDICAL PURPOSES ONLY.  IF CONFIRMATION IS NEEDED FOR ANY PURPOSE, NOTIFY LAB WITHIN 5 DAYS.        LOWEST DETECTABLE LIMITS FOR URINE DRUG SCREEN Drug Class       Cutoff (ng/mL) Amphetamine      1000 Barbiturate      200 Benzodiazepine   891 Tricyclics       694 Opiates           300 Cocaine          300 THC              50   Ethanol     Status: None   Collection Time: 06/17/17 10:24 AM  Result Value Ref Range   Alcohol, Ethyl (B) <10 <10 mg/dL    Comment:        LOWEST DETECTABLE LIMIT FOR SERUM ALCOHOL IS 10 mg/dL FOR MEDICAL PURPOSES ONLY Please note change in reference range.   Urinalysis, Routine w reflex microscopic     Status: None   Collection Time: 06/17/17 10:24 AM  Result Value Ref Range   Color, Urine YELLOW YELLOW   APPearance CLEAR CLEAR   Specific Gravity, Urine 1.010 1.005 - 1.030   pH 5.0 5.0 - 8.0   Glucose, UA NEGATIVE NEGATIVE mg/dL   Hgb urine dipstick NEGATIVE NEGATIVE   Bilirubin Urine NEGATIVE NEGATIVE   Ketones, ur NEGATIVE NEGATIVE mg/dL   Protein, ur NEGATIVE NEGATIVE mg/dL   Nitrite NEGATIVE NEGATIVE   Leukocytes, UA NEGATIVE NEGATIVE  CBC WITH DIFFERENTIAL     Status: Abnormal   Collection Time: 06/17/17 10:25 AM  Result Value Ref Range   WBC 5.5 4.0 - 10.5 K/uL   RBC 4.43 3.87 - 5.11 MIL/uL   Hemoglobin 10.3 (L) 12.0 - 15.0 g/dL   HCT 34.9 (L) 36.0 - 46.0 %   MCV 78.8 78.0 - 100.0 fL   MCH 23.3 (L) 26.0 - 34.0 pg   MCHC 29.5 (L) 30.0 - 36.0 g/dL   RDW 19.5 (H) 11.5 - 15.5 %   Platelets 405 (H) 150 - 400 K/uL   Neutrophils Relative % 58 %   Neutro Abs 3.2 1.7 - 7.7 K/uL   Lymphocytes Relative 29 %   Lymphs Abs 1.6 0.7 - 4.0 K/uL   Monocytes Relative 9 %   Monocytes Absolute 0.5 0.1 - 1.0 K/uL   Eosinophils Relative 3 %   Eosinophils Absolute 0.2 0.0 - 0.7 K/uL   Basophils Relative 1 %   Basophils Absolute 0.0 0.0 - 0.1 K/uL  Comprehensive metabolic panel     Status: Abnormal   Collection Time: 06/17/17 10:25 AM  Result Value Ref Range   Sodium 135 135 - 145 mmol/L   Potassium 3.6 3.5 - 5.1 mmol/L   Chloride 98 (L) 101 - 111 mmol/L   CO2 26 22 - 32 mmol/L   Glucose, Bld 66 65 - 99 mg/dL   BUN 5 (L) 6 - 20 mg/dL   Creatinine, Ser 0.53 0.44 - 1.00 mg/dL   Calcium 9.3 8.9 - 10.3 mg/dL   Total Protein 7.5  6.5 -  8.1 g/dL   Albumin 3.5 3.5 - 5.0 g/dL   AST 25 15 - 41 U/L   ALT 19 14 - 54 U/L   Alkaline Phosphatase 53 38 - 126 U/L   Total Bilirubin 0.3 0.3 - 1.2 mg/dL   GFR calc non Af Amer >60 >60 mL/min   GFR calc Af Amer >60 >60 mL/min    Comment: (NOTE) The eGFR has been calculated using the CKD EPI equation. This calculation has not been validated in all clinical situations. eGFR's persistently <60 mL/min signify possible Chronic Kidney Disease.    Anion gap 11 5 - 15  hCG, serum, qualitative     Status: None   Collection Time: 06/17/17 10:36 AM  Result Value Ref Range   Preg, Serum NEGATIVE NEGATIVE    Comment:        THE SENSITIVITY OF THIS METHODOLOGY IS >10 mIU/mL.     No results found.  Review of Systems  Constitutional: Negative for chills and fever.  Eyes: Negative for double vision and photophobia.  Respiratory: Negative for shortness of breath.   Cardiovascular: Negative for chest pain.  Gastrointestinal: Negative for abdominal pain.  Genitourinary: Negative for dysuria.  Neurological: Negative for tingling and sensory change.   Last menstrual period 06/11/2017. Physical Exam  Constitutional: She is oriented to person, place, and time. She is cooperative.  Non-toxic appearance.  Cardiovascular: S1 normal and S2 normal.   Pulmonary/Chest: Effort normal. No accessory muscle usage. No respiratory distress.  Musculoskeletal:  Right Lower Extremity     Ex fix stable     Pin sites ok     Some ulceration of thigh sites     Distal motor and sensory functions intact    Swelling vastly improved    + DP pulse     Fasciotomies are healed       Neurological: She is alert and oriented to person, place, and time.     Assessment/Plan:  31 y/o white female s/p fall while intoxicated about 6 weeks ago with complex bicondylar R tibial plateau fracture with compartment syndrome treated with ex fix and fasciotomies   -bicondylar R tibial plateau fracture, likely  malunion at this point  Removal of ex fix  Manipulation of R knee under anesthesia  Will order hinge brace and allow commencement of WB activities   CT scan post op   outpt vs overnight observation   Will likely need reconstruction at later date    Active medical issues pose significant risk for complications particularly continued EtOH abuse   - Pain management:  Pt on oral dilaudid and ultram pre-op  We have been checking narcotics database regularly with her. She had some aberrant behavior early on but has been compliant since    - Medical issues   Check hepatitis panel    - Dispo:  OR this am    Jari Pigg, PA-C [facility] [phone] (C) [phone] (O) 06/18/2017, 6:31 AM

## 2017-06-18 NOTE — Anesthesia Postprocedure Evaluation (Signed)
Anesthesia Post Note  Patient: MARLEENA SHUBERT  Procedure(s) Performed: REMOVAL RIGHT EXTERNAL FIXATOR (Right Leg Lower) CLOSED MANIPULATION UNDER ANESTHESIA RIGHT KNEE (Right Leg Lower)     Patient location during evaluation: PACU Anesthesia Type: General Level of consciousness: awake, awake and alert and oriented Pain management: pain level controlled Vital Signs Assessment: post-procedure vital signs reviewed and stable Respiratory status: spontaneous breathing, nonlabored ventilation and respiratory function stable Cardiovascular status: blood pressure returned to baseline Anesthetic complications: no    Last Vitals:  Vitals:   06/18/17 1215 06/18/17 1230  BP:    Pulse: 68 70  Resp:    Temp:    SpO2: 98% 100%    Last Pain:  Vitals:   06/18/17 1024  TempSrc:   PainSc: Asleep                 Wandra Babin COKER

## 2017-06-18 NOTE — Progress Notes (Signed)
Orthopedic Tech Progress Note Patient Details:  Dawn Foley [birth year] [id number]  Ortho Devices Ortho Device/Splint Location: Called [facility]advanced for Hinged knee brace.  (ortho tech) Ortho Device/Splint Interventions: Boris Lown 06/18/2017, 11:34 AM

## 2017-06-18 NOTE — Transfer of Care (Signed)
Immediate Anesthesia Transfer of Care Note  Patient: Dawn Foley  Procedure(s) Performed: REMOVAL RIGHT EXTERNAL FIXATOR (Right Leg Lower) CLOSED MANIPULATION UNDER ANESTHESIA RIGHT KNEE (Right Leg Lower)  Patient Location: PACU  Anesthesia Type:General  Level of Consciousness: awake, alert , oriented and sedated  Airway & Oxygen Therapy: Patient Spontanous Breathing and Patient connected to nasal cannula oxygen  Post-op Assessment: Report given to RN, Post -op Vital signs reviewed and stable and Patient moving all extremities  Post vital signs: Reviewed and stable  Last Vitals:  Vitals:   06/18/17 0652 06/18/17 0932  BP: 139/87 (P) 119/78  Pulse: 78 (P) 96  Resp: 20 (P) 14  Temp: 36.8 C (!) (P) 36.3 C  SpO2: 100% (P) 100%    Last Pain:  Vitals:   06/18/17 0932  TempSrc:   PainSc: (P) 0-No pain      Patients Stated Pain Goal: 0 (44/96/75 [id number])  Complications: No apparent anesthesia complications

## 2017-06-18 NOTE — Discharge Instructions (Addendum)
Orthopaedic Trauma Service Discharge Instructions   General Discharge Instructions  WEIGHT BEARING STATUS: Nonweightbearing Right leg   RANGE OF MOTION/ACTIVITY: unrestricted Range of motion R knee. Move right knee as much as possible. DO NOT rest with pillow under bend of knee. Place pillow under heel/ankle when at rest to get knee fully straight (full extension)   Wound Care: daily wound care starting on 06/21/2017. See below   Discharge Wound Care Instructions  Do NOT apply any ointments, solutions or lotions to pin sites or surgical wounds.  These prevent needed drainage and even though solutions like hydrogen peroxide kill bacteria, they also damage cells lining the pin sites that help fight infection.  Applying lotions or ointments can keep the wounds moist and can cause them to breakdown and open up as well. This can increase the risk for infection. When in doubt call the office.  Surgical incisions should be dressed daily.  If any drainage is noted, use one layer of adaptic, then gauze, Kerlix, and an ace wrap.  Once the incision is completely dry and without drainage, it may be left open to air out.  Showering may begin 36-48 hours later.  Cleaning gently with soap and water.  Traumatic wounds should be dressed daily as well.    One layer of adaptic, gauze, Kerlix, then ace wrap.  The adaptic can be discontinued once the draining has ceased    If you have a wet to dry dressing: wet the gauze with saline the squeeze as much saline out so the gauze is moist (not soaking wet), place moistened gauze over wound, then place a dry gauze over the moist one, followed by Kerlix wrap, then ace wrap.   Diet: as you were eating previously.  Can use over the counter stool softeners and bowel preparations, such as Miralax, to help with bowel movements.  Narcotics can be constipating.  Be sure to drink plenty of fluids  PAIN MEDICATION USE AND EXPECTATIONS  You have likely been given narcotic  medications to help control your pain.  After a traumatic event that results in an fracture (broken bone) with or without surgery, it is ok to use narcotic pain medications to help control one's pain.  We understand that everyone responds to pain differently and each individual patient will be evaluated on a regular basis for the continued need for narcotic medications. Ideally, narcotic medication use should last no more than 6-8 weeks (coinciding with fracture healing).   As a patient it is your responsibility as well to monitor narcotic medication use and report the amount and frequency you use these medications when you come to your office visit.   We would also advise that if you are using narcotic medications, you should take a dose prior to therapy to maximize you participation.  IF YOU ARE ON NARCOTIC MEDICATIONS IT IS NOT PERMISSIBLE TO OPERATE A MOTOR VEHICLE (MOTORCYCLE/CAR/TRUCK/MOPED) OR HEAVY MACHINERY DO NOT MIX NARCOTICS WITH OTHER CNS (CENTRAL NERVOUS SYSTEM) DEPRESSANTS SUCH AS ALCOHOL   STOP SMOKING OR USING NICOTINE PRODUCTS!!!!  As discussed nicotine severely impairs your body's ability to heal surgical and traumatic wounds but also impairs bone healing.  Wounds and bone heal by forming microscopic blood vessels (angiogenesis) and nicotine is a vasoconstrictor (essentially, shrinks blood vessels).  Therefore, if vasoconstriction occurs to these microscopic blood vessels they essentially disappear and are unable to deliver necessary nutrients to the healing tissue.  This is one modifiable factor that you can do to dramatically increase your  chances of healing your injury.    (This means no smoking, no nicotine gum, patches, etc)  DO NOT USE NONSTEROIDAL ANTI-INFLAMMATORY DRUGS (NSAID'S)  Using products such as Advil (ibuprofen), Aleve (naproxen), Motrin (ibuprofen) for additional pain control during fracture healing can delay and/or prevent the healing response.  If you would like to  take over the counter (OTC) medication, Tylenol (acetaminophen) is ok.  However, some narcotic medications that are given for pain control contain acetaminophen as well. Therefore, you should not exceed more than 4000 mg of tylenol in a day if you do not have liver disease.  Also note that there are may OTC medicines, such as cold medicines and allergy medicines that my contain tylenol as well.  If you have any questions about medications and/or interactions please ask your doctor/PA or your pharmacist.      ICE AND ELEVATE INJURED/OPERATIVE EXTREMITY  Using ice and elevating the injured extremity above your heart can help with swelling and pain control.  Icing in a pulsatile fashion, such as 20 minutes on and 20 minutes off, can be followed.    Do not place ice directly on skin. Make sure there is a barrier between to skin and the ice pack.    Using frozen items such as frozen peas works well as the conform nicely to the are that needs to be iced.  USE AN ACE WRAP OR TED HOSE FOR SWELLING CONTROL  In addition to icing and elevation, Ace wraps or TED hose are used to help limit and resolve swelling.  It is recommended to use Ace wraps or TED hose until you are informed to stop.    When using Ace Wraps start the wrapping distally (farthest away from the body) and wrap proximally (closer to the body)   Example: If you had surgery on your leg or thing and you do not have a splint on, start the ace wrap at the toes and work your way up to the thigh        If you had surgery on your upper extremity and do not have a splint on, start the ace wrap at your fingers and work your way up to the upper arm  IF YOU ARE IN A SPLINT OR CAST DO NOT [place]   If your splint gets wet for any reason please contact the office immediately. You may shower in your splint or cast as long as you keep it dry.  This can be done by wrapping in a cast cover or garbage back (or similar)  Do Not stick any thing  down your splint or cast such as pencils, money, or hangers to try and scratch yourself with.  If you feel itchy take benadryl as prescribed on the bottle for itching  IF YOU ARE IN A CAM BOOT (BLACK BOOT)  You may remove boot periodically. Perform daily dressing changes as noted below.  Wash the liner of the boot regularly and wear a sock when wearing the boot. It is recommended that you sleep in the boot until told otherwise  CALL THE OFFICE WITH ANY QUESTIONS OR CONCERNS: [phone]        Discharge Pin Site Instructions  Dress pins daily with Kerlix roll starting on POD 2. Wrap the Kerlix so that it tamps the skin down around the pin-skin interface to prevent/limit motion of the skin relative to the pin.  (Pin-skin motion is the primary cause of pain and infection related to external  fixator pin sites).  Remove any crust or coagulum that may obstruct drainage with a saline moistened gauze or soap and water.  After POD 3, if there is no discernable drainage on the pin site dressing, the interval for change can by increased to every other day.  You may shower with the fixator, cleaning all pin sites gently with soap and water.  If you have a surgical wound this needs to be completely dry and without drainage before showering.  The extremity can be lifted by the fixator to facilitate wound care and transfers.  Notify the office/Doctor if you experience increasing drainage, redness, or pain from a pin site, or if you notice purulent (thick, snot-like) drainage.

## 2017-06-18 NOTE — Op Note (Signed)
06/18/2017  9:16 AM  PATIENT:  Dawn Foley  31 y.o. female  PRE-OPERATIVE DIAGNOSIS:   1. RIGHT BICONDYLAR TIBIAL PLATEAU FRACTURE  2. RETAINED EXTERNAL FIXATOR (8 WEEKS) 3. ULCERATED PIN SITES  POST-OPERATIVE DIAGNOSIS:   1. RIGHT BICONDYLAR TIBIAL PLATEAU FRACTURE WITH POSSIBLE UNION  2. RETAINED EXTERNAL FIXATOR (8 WEEKS) 3. ULCERATED PIN SITES  PROCEDURE:  Procedure(s): 1. REMOVAL UNDER ANESTHESIA RIGHT EXTERNAL FIXATOR (Right) 2. DEBRIDEMENT OF ULCERATED PIN SITES 3. CLOSED MANIPULATION UNDER ANESTHESIA RIGHT KNEE (Right)  4. STRESS FLOURO OF TIBIAL PLATEAU FRACTURE SITE  SURGEON:  Surgeon(s) and Role:    * Altamese Helena, MD - Primary  PHYSICIAN ASSISTANT: Ainsley Spinner, PA-C  ANESTHESIA:   general  I/O:  Total I/O In: 34 [I.V.:800] Out: -   SPECIMEN:  No Specimen  TOURNIQUET:  NONE  DISPOSITION: TO PACU  CONDITION: STABLE  BRIEF SUMMARY OF INDICATIONS FOR PROCEDURE: Patient is 31 yo in spanning fixator for 8 weeks secondary to soft tissue complications and alcohol abuse, withdrawal, and then eventual detoxification. She presents for removal of fixation, manipulation, stress flouro eval of the fracture site, and debridement of her pins. I discussed with the patient and her mother the risks and benefits of surgery, including the possibility of infection, nerve injury, vessel injury, wound breakdown, arthritis, loss of reduction, DVT/ PE, loss of motion, malunion, nonunion, and need for further surgery among others.  She acknowledged these risks and wished to proceed.  BRIEF SUMMARY OF PROCEDURE:  The patient was taken to the operating room where general anesthesia was induced.  She did received preoperative Ancef.  After time-out, I worked together with my assistant to loosen and removal all clamps, bars, and pins.  The pin sites at both the femur and the tibia, which had ulcerated to a size of 1cm around the pins, were debrided with curettes at the skin,  subcutaneous tissues, muscle fascia, and near cortex of the bone removing all discernible devitalized necrotic tissue, bone debris, and desiccated fibrinous material.  This was thoroughly irrigated with saline.    The knee came into full extension with gravity but would not flex beyond 30 degrees. Gentle, sustained manipulation was performed, applying pressure to the proximal tibia at the tuberosity rather than levering distally to avoid straining the fractures site. Over time we were able to obtain 105 degrees.   I then brought in the flouro machine, and performed varus valgus stress under live flouro which showed no movement between the fracture fragments.   A thorough wash was performed once more then a sterile, gently compressive dressing applied from foot to thigh.  PROGNOSIS:  The patient will undergo a CT scan of the tibial plateau to evaluate her articular congruity in addition to healing. She will remain NWB for two more weeks with aggressive knee motion. She will obtain a hinged knee brace with full motion. In two weeks we will begin progressive WB in the hinge. No formal DVT prophylaxis is indicated at this time.   Altamese Ullin, MD [facility] [phone] (p)

## 2017-06-18 NOTE — Anesthesia Procedure Notes (Signed)
Procedure Name: Intubation Date/Time: 06/18/2017 8:33 AM Performed by: Scheryl Darter Pre-anesthesia Checklist: Patient identified, Emergency Drugs available, Suction available and Patient being monitored Patient Re-evaluated:Patient Re-evaluated prior to induction Oxygen Delivery Method: Circle System Utilized Preoxygenation: Pre-oxygenation with 100% oxygen Induction Type: IV induction Ventilation: Mask ventilation without difficulty Grade View: Grade I Tube type: Oral Tube size: 7.0 mm Number of attempts: 1 Airway Equipment and Method: Stylet and Oral airway Placement Confirmation: ETT inserted through vocal cords under direct vision,  positive ETCO2 and breath sounds checked- equal and bilateral Secured at: 21 cm Tube secured with: Tape Dental Injury: Teeth and Oropharynx as per pre-operative assessment

## 2017-06-18 NOTE — Progress Notes (Signed)
Patient started crying and said that she needs her pain medicines when L leg was elevated on a pillow per her request, then doze back to sleep easily. Asleep when not waken up as of this time.

## 2017-06-18 NOTE — Progress Notes (Signed)
Patient requested for I/O cath to be done.

## 2017-06-18 NOTE — H&P (Signed)
Orthopaedic Trauma Service (OTS) Consult   Patient ID: Dawn Foley MRN: [id number] DOB/AGE: 11/07/1985 31 y.o.   Reason for Consult: retained ex fix R leg, R tibial plateau fracture    HPI: Dawn Foley is an 31 y.o. female with h/o EtOH abuse, hep c, depression, anxiety, s/p nicotine dependence who R tibial plateau fracture with acute compartment syndrome approximately 6 weeks or so from injury. Pt was treated at [facility] with ex fix and fasciotomies. She was referred to OTS for definitive treatment. Severe soft tissue swelling and wound complications around her fasciotomy sites precluded formal ORIF. Pt has also be admitted for detox as well during this period. Pt presents today for removal of ex fix and manipulation of R knee under anesthesia. She will likely require extensive reconstruction at a later date. Will also obtain CT after ex fix removal   Past Medical History:  Diagnosis Date  . Alcoholism (HCC)   . Anemia   . Anxiety   . Arthritis   . Asthma    as a child  . Bipolar disorder (HCC)   . Cancer (HCC)    colon  . Complication of anesthesia    woke up during colonoscopy  . Depression   . Elevated liver enzymes   . Heart murmur    per [facility] per PT  . Hepatitis    denies  . Hepatitis C   . Hypothyroidism   . Schizophrenia (HCC)   . Seizures (HCC)   . Tibial plateau fracture, right     Past Surgical History:  Procedure Laterality Date  . APPENDECTOMY    . DILATION AND CURETTAGE OF UTERUS    . ESOPHAGOGASTRODUODENOSCOPY (EGD) WITH PROPOFOL N/A 09/04/2016   Procedure: ESOPHAGOGASTRODUODENOSCOPY (EGD) WITH PROPOFOL;  Surgeon: Henry L Danis III, MD;  Location: [facility] ENDOSCOPY;  Service: Endoscopy;  Laterality: N/A;  . HERNIA REPAIR    . NO PAST SURGERIES    . ORIF TIBIA PLATEAU      Family History  Problem Relation Age of Onset  . Mental illness Other   . Thyroid disease Other   . Alcoholism Brother     Social History:  reports  that she has been smoking Cigarettes.  She has been smoking about 1.00 pack per day. She has never used smokeless tobacco. She reports that she drinks alcohol. She reports that she does not use drugs.  Allergies:  Allergies  Allergen Reactions  . Ondansetron Nausea And Vomiting    Medications:  I have reviewed the patient's current medications. Prior to Admission:  Current Meds  Medication Sig  . HYDROmorphone (DILAUDID) 4 MG tablet Take 8 mg by mouth every 4 (four) hours as needed for moderate pain or severe pain.  . methocarbamol (ROBAXIN) 500 MG tablet Take 1-2 tablets by mouth every 6 (six) hours as needed for muscle spasms.  . Multiple Vitamin (MULTIVITAMIN WITH MINERALS) TABS tablet Take 1 tablet by mouth daily. Vitamin supplement  . Multiple Vitamins-Minerals (MULTIPLE VITAMINS/WOMENS PO) Take 1 tablet by mouth daily.  . naphazoline-glycerin (CLEAR EYES) 0.012-0.2 % SOLN Place 1-2 drops into both eyes 4 (four) times daily as needed for irritation.  . traMADol (ULTRAM) 50 MG tablet Take 1-2 tablets by mouth every 8 (eight) hours as needed for breakthrough pain.     Results for orders placed or performed during the hospital encounter of 06/17/17 (from the past 48 hour(s))  Urine rapid drug screen (hosp performed)     Status: Abnormal     Collection Time: 06/17/17 10:23 AM  Result Value Ref Range   Opiates POSITIVE (A) NONE DETECTED   Cocaine NONE DETECTED NONE DETECTED   Benzodiazepines POSITIVE (A) NONE DETECTED   Amphetamines NONE DETECTED NONE DETECTED   Tetrahydrocannabinol NONE DETECTED NONE DETECTED   Barbiturates NONE DETECTED NONE DETECTED    Comment:        DRUG SCREEN FOR MEDICAL PURPOSES ONLY.  IF CONFIRMATION IS NEEDED FOR ANY PURPOSE, NOTIFY LAB WITHIN 5 DAYS.        LOWEST DETECTABLE LIMITS FOR URINE DRUG SCREEN Drug Class       Cutoff (ng/mL) Amphetamine      1000 Barbiturate      200 Benzodiazepine   200 Tricyclics       300 Opiates           300 Cocaine          300 THC              50   Ethanol     Status: None   Collection Time: 06/17/17 10:24 AM  Result Value Ref Range   Alcohol, Ethyl (B) <10 <10 mg/dL    Comment:        LOWEST DETECTABLE LIMIT FOR SERUM ALCOHOL IS 10 mg/dL FOR MEDICAL PURPOSES ONLY Please note change in reference range.   Urinalysis, Routine w reflex microscopic     Status: None   Collection Time: 06/17/17 10:24 AM  Result Value Ref Range   Color, Urine YELLOW YELLOW   APPearance CLEAR CLEAR   Specific Gravity, Urine 1.010 1.005 - 1.030   pH 5.0 5.0 - 8.0   Glucose, UA NEGATIVE NEGATIVE mg/dL   Hgb urine dipstick NEGATIVE NEGATIVE   Bilirubin Urine NEGATIVE NEGATIVE   Ketones, ur NEGATIVE NEGATIVE mg/dL   Protein, ur NEGATIVE NEGATIVE mg/dL   Nitrite NEGATIVE NEGATIVE   Leukocytes, UA NEGATIVE NEGATIVE  CBC WITH DIFFERENTIAL     Status: Abnormal   Collection Time: 06/17/17 10:25 AM  Result Value Ref Range   WBC 5.5 4.0 - 10.5 K/uL   RBC 4.43 3.87 - 5.11 MIL/uL   Hemoglobin 10.3 (L) 12.0 - 15.0 g/dL   HCT 34.9 (L) 36.0 - 46.0 %   MCV 78.8 78.0 - 100.0 fL   MCH 23.3 (L) 26.0 - 34.0 pg   MCHC 29.5 (L) 30.0 - 36.0 g/dL   RDW 19.5 (H) 11.5 - 15.5 %   Platelets 405 (H) 150 - 400 K/uL   Neutrophils Relative % 58 %   Neutro Abs 3.2 1.7 - 7.7 K/uL   Lymphocytes Relative 29 %   Lymphs Abs 1.6 0.7 - 4.0 K/uL   Monocytes Relative 9 %   Monocytes Absolute 0.5 0.1 - 1.0 K/uL   Eosinophils Relative 3 %   Eosinophils Absolute 0.2 0.0 - 0.7 K/uL   Basophils Relative 1 %   Basophils Absolute 0.0 0.0 - 0.1 K/uL  Comprehensive metabolic panel     Status: Abnormal   Collection Time: 06/17/17 10:25 AM  Result Value Ref Range   Sodium 135 135 - 145 mmol/L   Potassium 3.6 3.5 - 5.1 mmol/L   Chloride 98 (L) 101 - 111 mmol/L   CO2 26 22 - 32 mmol/L   Glucose, Bld 66 65 - 99 mg/dL   BUN 5 (L) 6 - 20 mg/dL   Creatinine, Ser 0.53 0.44 - 1.00 mg/dL   Calcium 9.3 8.9 - 10.3 mg/dL   Total Protein 7.5  6.5 -   8.1 g/dL   Albumin 3.5 3.5 - 5.0 g/dL   AST 25 15 - 41 U/L   ALT 19 14 - 54 U/L   Alkaline Phosphatase 53 38 - 126 U/L   Total Bilirubin 0.3 0.3 - 1.2 mg/dL   GFR calc non Af Amer >60 >60 mL/min   GFR calc Af Amer >60 >60 mL/min    Comment: (NOTE) The eGFR has been calculated using the CKD EPI equation. This calculation has not been validated in all clinical situations. eGFR's persistently <60 mL/min signify possible Chronic Kidney Disease.    Anion gap 11 5 - 15  hCG, serum, qualitative     Status: None   Collection Time: 06/17/17 10:36 AM  Result Value Ref Range   Preg, Serum NEGATIVE NEGATIVE    Comment:        THE SENSITIVITY OF THIS METHODOLOGY IS >10 mIU/mL.     No results found.  Review of Systems  Constitutional: Negative for chills and fever.  Eyes: Negative for double vision and photophobia.  Respiratory: Negative for shortness of breath.   Cardiovascular: Negative for chest pain.  Gastrointestinal: Negative for abdominal pain.  Genitourinary: Negative for dysuria.  Neurological: Negative for tingling and sensory change.   Last menstrual period 06/11/2017. Physical Exam  Constitutional: She is oriented to person, place, and time. She is cooperative.  Non-toxic appearance.  Cardiovascular: S1 normal and S2 normal.   Pulmonary/Chest: Effort normal. No accessory muscle usage. No respiratory distress.  Musculoskeletal:  Right Lower Extremity     Ex fix stable     Pin sites ok     Some ulceration of thigh sites     Distal motor and sensory functions intact    Swelling vastly improved    + DP pulse     Fasciotomies are healed       Neurological: She is alert and oriented to person, place, and time.     Assessment/Plan:  31 y/o white female s/p fall while intoxicated about 6 weeks ago with complex bicondylar R tibial plateau fracture with compartment syndrome treated with ex fix and fasciotomies   -bicondylar R tibial plateau fracture, likely  malunion at this point  Removal of ex fix  Manipulation of R knee under anesthesia  Will order hinge brace and allow commencement of WB activities   CT scan post op   outpt vs overnight observation   Will likely need reconstruction at later date    Active medical issues pose significant risk for complications particularly continued EtOH abuse   - Pain management:  Pt on oral dilaudid and ultram pre-op  We have been checking narcotics database regularly with her. She had some aberrant behavior early on but has been compliant since    - Medical issues   Check hepatitis panel    - Dispo:  OR this am    Camp Gopal W. Keawe Marcello, PA-C [facility] [phone] (P) [phone] (C) [phone] (O) 06/18/2017, 6:31 AM      

## 2017-06-18 NOTE — Anesthesia Preprocedure Evaluation (Signed)
Anesthesia Evaluation  Patient identified by MRN, date of birth, ID band Patient awake    Reviewed: Allergy & Precautions, NPO status , Patient's Chart, lab work & pertinent test results  Airway Mallampati: II  TM Distance: >3 FB Neck ROM: Full    Dental  (+) Teeth Intact, Dental Advisory Given   Pulmonary Current Smoker,    breath sounds clear to auscultation       Cardiovascular  Rhythm:Regular     Neuro/Psych    GI/Hepatic   Endo/Other    Renal/GU      Musculoskeletal   Abdominal   Peds  Hematology   Anesthesia Other Findings   Reproductive/Obstetrics                             Anesthesia Physical Anesthesia Plan  ASA: II  Anesthesia Plan: General   Post-op Pain Management:    Induction: Intravenous  PONV Risk Score and Plan: Ondansetron and Dexamethasone  Airway Management Planned: Oral ETT  Additional Equipment:   Intra-op Plan:   Post-operative Plan: Extubation in OR  Informed Consent: I have reviewed the patients History and Physical, chart, labs and discussed the procedure including the risks, benefits and alternatives for the proposed anesthesia with the patient or authorized representative who has indicated his/her understanding and acceptance.   Dental advisory given  Plan Discussed with: CRNA and Anesthesiologist  Anesthesia Plan Comments:         Anesthesia Quick Evaluation

## 2017-06-19 ENCOUNTER — Encounter (HOSPITAL_COMMUNITY): Payer: Self-pay | Admitting: Orthopedic Surgery

## 2017-06-19 LAB — HEPATITIS PANEL, ACUTE
HCV Ab: >11 s/co ratio — ABNORMAL HIGH (ref 0.0–0.9)
HEP B C IGM: NEGATIVE
HEP B S AG: NEGATIVE
Hep A IgM: NEGATIVE

## 2017-06-19 LAB — HCV RNA QUANT RFLX ULTRA OR GENOTYP
HCV RNA Qnt(log copy/mL): UNDETERMINED log10 IU/mL
HepC Qn: NOT DETECTED IU/mL

## 2017-06-30 DIAGNOSIS — M25569 Pain in unspecified knee: Secondary | ICD-10-CM

## 2017-06-30 DIAGNOSIS — F319 Bipolar disorder, unspecified: Secondary | ICD-10-CM

## 2017-06-30 DIAGNOSIS — S82141A Displaced bicondylar fracture of right tibia, initial encounter for closed fracture: Secondary | ICD-10-CM

## 2017-06-30 DIAGNOSIS — F10239 Alcohol dependence with withdrawal, unspecified: Secondary | ICD-10-CM

## 2017-07-17 ENCOUNTER — Encounter (HOSPITAL_COMMUNITY): Payer: Self-pay | Admitting: *Deleted

## 2017-07-17 NOTE — Progress Notes (Signed)
Spoke with pt for pre-op call. Pt states she thinks she was told she had a heart attack sometime this year at [facility]. She states she was admitted at the time for extreme alcoholism and doesn't remember anything about what she was told. I have requested any cardiac studies and discharge summaries from any visits at [facility] for 2018. Pt denies any recent chest pain or sob. Pt states she does have hx of seizures, but cannot afford the Tegretol that is prescribed for her. She states her last seizure was early September, 2018. She had surgery in October, 2018 and chart was reviewed at that time by Willeen Cass, NP.

## 2017-07-18 ENCOUNTER — Encounter (HOSPITAL_COMMUNITY): Payer: Self-pay

## 2017-07-18 ENCOUNTER — Inpatient Hospital Stay (HOSPITAL_COMMUNITY)
Admission: RE | Admit: 2017-07-18 | Discharge: 2017-07-21 | DRG: 488 | Disposition: A | Discharge status: Home Health | Payer: Self-pay | Source: Ambulatory Visit | Attending: Orthopedic Surgery | Admitting: Orthopedic Surgery

## 2017-07-18 ENCOUNTER — Encounter (HOSPITAL_COMMUNITY)
Admission: RE | Disposition: A | Discharge status: Home Health | Payer: Self-pay | Source: Ambulatory Visit | Attending: Orthopedic Surgery

## 2017-07-18 ENCOUNTER — Other Ambulatory Visit: Payer: Self-pay

## 2017-07-18 ENCOUNTER — Inpatient Hospital Stay (HOSPITAL_COMMUNITY): Payer: Self-pay | Admitting: Anesthesiology

## 2017-07-18 ENCOUNTER — Inpatient Hospital Stay (HOSPITAL_COMMUNITY): Payer: Self-pay

## 2017-07-18 DIAGNOSIS — Z818 Family history of other mental and behavioral disorders: Secondary | ICD-10-CM

## 2017-07-18 DIAGNOSIS — Z716 Tobacco abuse counseling: Secondary | ICD-10-CM

## 2017-07-18 DIAGNOSIS — D62 Acute posthemorrhagic anemia: Secondary | ICD-10-CM | POA: Diagnosis not present

## 2017-07-18 DIAGNOSIS — Y848 Other medical procedures as the cause of abnormal reaction of the patient, or of later complication, without mention of misadventure at the time of the procedure: Secondary | ICD-10-CM | POA: Diagnosis not present

## 2017-07-18 DIAGNOSIS — Z9114 Patient's other noncompliance with medication regimen: Secondary | ICD-10-CM

## 2017-07-18 DIAGNOSIS — Z419 Encounter for procedure for purposes other than remedying health state, unspecified: Secondary | ICD-10-CM

## 2017-07-18 DIAGNOSIS — F1020 Alcohol dependence, uncomplicated: Secondary | ICD-10-CM | POA: Diagnosis present

## 2017-07-18 DIAGNOSIS — E039 Hypothyroidism, unspecified: Secondary | ICD-10-CM | POA: Diagnosis present

## 2017-07-18 DIAGNOSIS — K219 Gastro-esophageal reflux disease without esophagitis: Secondary | ICD-10-CM | POA: Diagnosis present

## 2017-07-18 DIAGNOSIS — B1920 Unspecified viral hepatitis C without hepatic coma: Secondary | ICD-10-CM | POA: Diagnosis present

## 2017-07-18 DIAGNOSIS — F209 Schizophrenia, unspecified: Secondary | ICD-10-CM | POA: Diagnosis present

## 2017-07-18 DIAGNOSIS — Z888 Allergy status to other drugs, medicaments and biological substances status: Secondary | ICD-10-CM

## 2017-07-18 DIAGNOSIS — Z85038 Personal history of other malignant neoplasm of large intestine: Secondary | ICD-10-CM

## 2017-07-18 DIAGNOSIS — F319 Bipolar disorder, unspecified: Secondary | ICD-10-CM | POA: Diagnosis present

## 2017-07-18 DIAGNOSIS — E58 Dietary calcium deficiency: Secondary | ICD-10-CM | POA: Diagnosis present

## 2017-07-18 DIAGNOSIS — Z811 Family history of alcohol abuse and dependence: Secondary | ICD-10-CM

## 2017-07-18 DIAGNOSIS — E559 Vitamin D deficiency, unspecified: Secondary | ICD-10-CM | POA: Diagnosis present

## 2017-07-18 DIAGNOSIS — S82141K Displaced bicondylar fracture of right tibia, subsequent encounter for closed fracture with nonunion: Principal | ICD-10-CM

## 2017-07-18 DIAGNOSIS — E8889 Other specified metabolic disorders: Secondary | ICD-10-CM | POA: Diagnosis present

## 2017-07-18 DIAGNOSIS — W19XXXD Unspecified fall, subsequent encounter: Secondary | ICD-10-CM | POA: Diagnosis present

## 2017-07-18 DIAGNOSIS — S82143K Displaced bicondylar fracture of unspecified tibia, subsequent encounter for closed fracture with nonunion: Secondary | ICD-10-CM | POA: Diagnosis present

## 2017-07-18 DIAGNOSIS — Z79899 Other long term (current) drug therapy: Secondary | ICD-10-CM

## 2017-07-18 DIAGNOSIS — T8092XA Unspecified transfusion reaction, initial encounter: Secondary | ICD-10-CM | POA: Diagnosis not present

## 2017-07-18 DIAGNOSIS — F419 Anxiety disorder, unspecified: Secondary | ICD-10-CM | POA: Diagnosis present

## 2017-07-18 DIAGNOSIS — F17210 Nicotine dependence, cigarettes, uncomplicated: Secondary | ICD-10-CM | POA: Diagnosis present

## 2017-07-18 DIAGNOSIS — Z9981 Dependence on supplemental oxygen: Secondary | ICD-10-CM

## 2017-07-18 HISTORY — DX: Alcohol abuse, in remission: F10.11

## 2017-07-18 HISTORY — DX: Gastro-esophageal reflux disease without esophagitis: K21.9

## 2017-07-18 HISTORY — PX: ORIF FEMUR FRACTURE: SHX2119

## 2017-07-18 HISTORY — DX: Chronic kidney disease, unspecified: N18.9

## 2017-07-18 LAB — URINALYSIS, ROUTINE W REFLEX MICROSCOPIC
Bilirubin Urine: NEGATIVE
GLUCOSE, UA: NEGATIVE mg/dL
HGB URINE DIPSTICK: NEGATIVE
KETONES UR: NEGATIVE mg/dL
Leukocytes, UA: NEGATIVE
Nitrite: NEGATIVE
PROTEIN: NEGATIVE mg/dL
Specific Gravity, Urine: 1.004 — ABNORMAL LOW (ref 1.005–1.030)
pH: 5 (ref 5.0–8.0)

## 2017-07-18 LAB — RAPID URINE DRUG SCREEN, HOSP PERFORMED
Amphetamines: NOT DETECTED
BARBITURATES: NOT DETECTED
BENZODIAZEPINES: POSITIVE — AB
Cocaine: NOT DETECTED
Opiates: NOT DETECTED
Tetrahydrocannabinol: NOT DETECTED

## 2017-07-18 LAB — CBC WITH DIFFERENTIAL/PLATELET
BASOS ABS: 0 10*3/uL (ref 0.0–0.1)
BASOS PCT: 0 %
EOS PCT: 6 %
Eosinophils Absolute: 0.5 10*3/uL (ref 0.0–0.7)
HCT: 42.2 % (ref 36.0–46.0)
Hemoglobin: 12.9 g/dL (ref 12.0–15.0)
Lymphocytes Relative: 28 %
Lymphs Abs: 2 10*3/uL (ref 0.7–4.0)
MCH: 23.6 pg — ABNORMAL LOW (ref 26.0–34.0)
MCHC: 30.6 g/dL (ref 30.0–36.0)
MCV: 77.3 fL — ABNORMAL LOW (ref 78.0–100.0)
MONO ABS: 0.4 10*3/uL (ref 0.1–1.0)
Monocytes Relative: 6 %
NEUTROS ABS: 4.3 10*3/uL (ref 1.7–7.7)
Neutrophils Relative %: 60 %
PLATELETS: 311 10*3/uL (ref 150–400)
RBC: 5.46 MIL/uL — ABNORMAL HIGH (ref 3.87–5.11)
RDW: 19 % — AB (ref 11.5–15.5)
WBC: 7.2 10*3/uL (ref 4.0–10.5)

## 2017-07-18 LAB — CBC
HCT: 30.9 % — ABNORMAL LOW (ref 36.0–46.0)
Hemoglobin: 9.2 g/dL — ABNORMAL LOW (ref 12.0–15.0)
MCH: 23.2 pg — AB (ref 26.0–34.0)
MCHC: 29.8 g/dL — ABNORMAL LOW (ref 30.0–36.0)
MCV: 78 fL (ref 78.0–100.0)
PLATELETS: 238 10*3/uL (ref 150–400)
RBC: 3.96 MIL/uL (ref 3.87–5.11)
RDW: 19.2 % — ABNORMAL HIGH (ref 11.5–15.5)
WBC: 9.5 10*3/uL (ref 4.0–10.5)

## 2017-07-18 LAB — PROTIME-INR
INR: 0.95
Prothrombin Time: 12.6 seconds (ref 11.4–15.2)

## 2017-07-18 LAB — CREATININE, SERUM
CREATININE: 0.81 mg/dL (ref 0.44–1.00)
GFR calc Af Amer: >60 mL/min (ref >60)
GFR calc non Af Amer: >60 mL/min (ref >60)

## 2017-07-18 LAB — HIV ANTIBODY (ROUTINE TESTING W REFLEX): HIV SCREEN 4TH GENERATION: NONREACTIVE

## 2017-07-18 LAB — HEMOGLOBIN A1C
Hgb A1c MFr Bld: 5.5 % (ref 4.8–5.6)
Mean Plasma Glucose: 111.15 mg/dL

## 2017-07-18 LAB — PHOSPHORUS: Phosphorus: 2.6 mg/dL (ref 2.5–4.6)

## 2017-07-18 LAB — I-STAT BETA HCG BLOOD, ED (NOT ORDERABLE): I-stat hCG, quantitative: <5 m[IU]/mL (ref <5)

## 2017-07-18 LAB — ETHANOL

## 2017-07-18 LAB — PREALBUMIN: PREALBUMIN: 25.8 mg/dL (ref 18–38)

## 2017-07-18 LAB — TSH: TSH: 13.342 u[IU]/mL — AB (ref 0.350–4.500)

## 2017-07-18 LAB — APTT: APTT: 29 s (ref 24–36)

## 2017-07-18 LAB — MAGNESIUM: MAGNESIUM: 2.1 mg/dL (ref 1.7–2.4)

## 2017-07-18 SURGERY — OPEN REDUCTION INTERNAL FIXATION (ORIF) DISTAL FEMUR FRACTURE
Anesthesia: General | Laterality: Right

## 2017-07-18 MED ORDER — MIDAZOLAM HCL 2 MG/2ML IJ SOLN
INTRAMUSCULAR | Status: AC
  Administered 2017-07-18: 2 mg via INTRAVENOUS
  Filled 2017-07-18: qty 2

## 2017-07-18 MED ORDER — ONDANSETRON HCL 4 MG/2ML IJ SOLN: 4.0000 mg | Freq: Once | INTRAMUSCULAR | Status: DC | PRN

## 2017-07-18 MED ORDER — ACETAMINOPHEN 650 MG RE SUPP: 650.0000 mg | RECTAL | Status: DC | PRN

## 2017-07-18 MED ORDER — MIDAZOLAM HCL 2 MG/2ML IJ SOLN
INTRAMUSCULAR | Status: AC
  Filled 2017-07-18: qty 2

## 2017-07-18 MED ORDER — HYDROCODONE-ACETAMINOPHEN 10-325 MG PO TABS
2.0000 | ORAL_TABLET | ORAL | Status: DC | PRN
  Administered 2017-07-18 – 2017-07-21 (×13): 2 via ORAL
  Filled 2017-07-18 (×12): qty 2

## 2017-07-18 MED ORDER — POLYETHYLENE GLYCOL 3350 17 G PO PACK
17.0000 g | PACK | Freq: Every day | ORAL | Status: DC
  Administered 2017-07-18 – 2017-07-21 (×4): 17 g via ORAL
  Filled 2017-07-18 (×4): qty 1

## 2017-07-18 MED ORDER — CHLORHEXIDINE GLUCONATE 4 % EX LIQD: 60.0000 mL | Freq: Once | CUTANEOUS | Status: DC

## 2017-07-18 MED ORDER — PROPOFOL 10 MG/ML IV BOLUS
INTRAVENOUS | Status: DC | PRN
  Administered 2017-07-18: 140 mg via INTRAVENOUS

## 2017-07-18 MED ORDER — DEXMEDETOMIDINE HCL IN NACL 200 MCG/50ML IV SOLN
INTRAVENOUS | Status: AC
  Filled 2017-07-18: qty 50

## 2017-07-18 MED ORDER — MIDAZOLAM HCL 5 MG/5ML IJ SOLN
INTRAMUSCULAR | Status: DC | PRN
  Administered 2017-07-18: 2 mg via INTRAVENOUS

## 2017-07-18 MED ORDER — LEVOTHYROXINE SODIUM 75 MCG PO TABS
75.0000 ug | ORAL_TABLET | Freq: Every day | ORAL | Status: DC
  Administered 2017-07-19 – 2017-07-21 (×3): 75 ug via ORAL
  Filled 2017-07-18 (×2): qty 1

## 2017-07-18 MED ORDER — CEFAZOLIN SODIUM-DEXTROSE 2-4 GM/100ML-% IV SOLN
INTRAVENOUS | Status: AC
  Filled 2017-07-18: qty 100

## 2017-07-18 MED ORDER — DOCUSATE SODIUM 100 MG PO CAPS
100.0000 mg | ORAL_CAPSULE | Freq: Two times a day (BID) | ORAL | Status: DC
  Administered 2017-07-18 – 2017-07-21 (×6): 100 mg via ORAL
  Filled 2017-07-18 (×6): qty 1

## 2017-07-18 MED ORDER — MIDAZOLAM HCL 2 MG/2ML IJ SOLN
2.0000 mg | Freq: Once | INTRAMUSCULAR | Status: AC
Start: 2017-07-18 — End: 2017-07-18
  Administered 2017-07-18: 2 mg via INTRAVENOUS

## 2017-07-18 MED ORDER — HYDROCODONE-ACETAMINOPHEN 10-325 MG PO TABS
1.0000 | ORAL_TABLET | ORAL | Status: DC | PRN
  Filled 2017-07-18 (×3): qty 1

## 2017-07-18 MED ORDER — LIDOCAINE 2% (20 MG/ML) 5 ML SYRINGE
INTRAMUSCULAR | Status: AC
  Filled 2017-07-18: qty 5

## 2017-07-18 MED ORDER — PROPOFOL 10 MG/ML IV BOLUS
INTRAVENOUS | Status: AC
  Filled 2017-07-18: qty 20

## 2017-07-18 MED ORDER — HYDROMORPHONE HCL 1 MG/ML IJ SOLN: 0.2500 mg | INTRAMUSCULAR | Status: DC | PRN

## 2017-07-18 MED ORDER — FENTANYL CITRATE (PF) 100 MCG/2ML IJ SOLN
25.0000 ug | INTRAMUSCULAR | Status: DC | PRN
  Administered 2017-07-18 – 2017-07-20 (×7): 50 ug via INTRAVENOUS
  Administered 2017-07-20: 25 ug via INTRAVENOUS
  Administered 2017-07-21 (×2): 50 ug via INTRAVENOUS
  Filled 2017-07-18 (×10): qty 2

## 2017-07-18 MED ORDER — PHENYLEPHRINE HCL 10 MG/ML IJ SOLN
INTRAVENOUS | Status: DC | PRN
  Administered 2017-07-18: 50 ug/min via INTRAVENOUS

## 2017-07-18 MED ORDER — ACETAMINOPHEN 325 MG PO TABS: 650.0000 mg | ORAL_TABLET | ORAL | Status: DC | PRN

## 2017-07-18 MED ORDER — ROCURONIUM BROMIDE 10 MG/ML (PF) SYRINGE
PREFILLED_SYRINGE | INTRAVENOUS | Status: AC
  Filled 2017-07-18: qty 5

## 2017-07-18 MED ORDER — LACTATED RINGERS IV SOLN
INTRAVENOUS | Status: DC | PRN
  Administered 2017-07-18: 15:00:00 via INTRAVENOUS

## 2017-07-18 MED ORDER — CYCLOBENZAPRINE HCL 10 MG PO TABS
10.0000 mg | ORAL_TABLET | Freq: Three times a day (TID) | ORAL | Status: DC | PRN
Start: 2017-07-18 — End: 2017-07-21
  Administered 2017-07-18 – 2017-07-20 (×6): 10 mg via ORAL
  Filled 2017-07-18 (×6): qty 1

## 2017-07-18 MED ORDER — METOCLOPRAMIDE HCL 5 MG PO TABS
5.0000 mg | ORAL_TABLET | Freq: Three times a day (TID) | ORAL | Status: DC | PRN
  Administered 2017-07-18: 10 mg via ORAL
  Filled 2017-07-18: qty 2

## 2017-07-18 MED ORDER — LIDOCAINE 2% (20 MG/ML) 5 ML SYRINGE
INTRAMUSCULAR | Status: DC | PRN
  Administered 2017-07-18: 40 mg via INTRAVENOUS

## 2017-07-18 MED ORDER — OXYCODONE HCL 5 MG PO TABS: 5.0000 mg | ORAL_TABLET | Freq: Once | ORAL | Status: DC | PRN

## 2017-07-18 MED ORDER — CEFAZOLIN SODIUM-DEXTROSE 2-4 GM/100ML-% IV SOLN
2.0000 g | INTRAVENOUS | Status: AC
  Administered 2017-07-18: 2 g via INTRAVENOUS

## 2017-07-18 MED ORDER — FENTANYL CITRATE (PF) 250 MCG/5ML IJ SOLN
INTRAMUSCULAR | Status: AC
  Filled 2017-07-18: qty 5

## 2017-07-18 MED ORDER — METOCLOPRAMIDE HCL 5 MG/ML IJ SOLN: 5.0000 mg | Freq: Three times a day (TID) | INTRAMUSCULAR | Status: DC | PRN

## 2017-07-18 MED ORDER — PHENYLEPHRINE 40 MCG/ML (10ML) SYRINGE FOR IV PUSH (FOR BLOOD PRESSURE SUPPORT)
PREFILLED_SYRINGE | INTRAVENOUS | Status: DC | PRN
  Administered 2017-07-18: 120 ug via INTRAVENOUS
  Administered 2017-07-18 (×3): 80 ug via INTRAVENOUS

## 2017-07-18 MED ORDER — MAGNESIUM CITRATE PO SOLN
1.0000 | Freq: Once | ORAL | Status: DC | PRN
Start: 2017-07-18 — End: 2017-07-21

## 2017-07-18 MED ORDER — ROCURONIUM BROMIDE 10 MG/ML (PF) SYRINGE
PREFILLED_SYRINGE | INTRAVENOUS | Status: DC | PRN
  Administered 2017-07-18 (×2): 30 mg via INTRAVENOUS
  Administered 2017-07-18: 20 mg via INTRAVENOUS
  Administered 2017-07-18: 50 mg via INTRAVENOUS
  Administered 2017-07-18: 20 mg via INTRAVENOUS

## 2017-07-18 MED ORDER — 0.9 % SODIUM CHLORIDE (POUR BTL) OPTIME
TOPICAL | Status: DC | PRN
  Administered 2017-07-18: 1000 mL

## 2017-07-18 MED ORDER — HYDROXYZINE HCL 25 MG PO TABS
50.0000 mg | ORAL_TABLET | Freq: Three times a day (TID) | ORAL | Status: DC | PRN
  Administered 2017-07-19 – 2017-07-20 (×3): 50 mg via ORAL
  Filled 2017-07-18 (×4): qty 2

## 2017-07-18 MED ORDER — FENTANYL CITRATE (PF) 250 MCG/5ML IJ SOLN
INTRAMUSCULAR | Status: DC | PRN
  Administered 2017-07-18 (×2): 50 ug via INTRAVENOUS

## 2017-07-18 MED ORDER — SUGAMMADEX SODIUM 200 MG/2ML IV SOLN
INTRAVENOUS | Status: DC | PRN
  Administered 2017-07-18: 175 mg via INTRAVENOUS

## 2017-07-18 MED ORDER — PROMETHAZINE HCL 25 MG/ML IJ SOLN
25.0000 mg | INTRAMUSCULAR | Status: AC
  Administered 2017-07-18: 12.5 mg via INTRAVENOUS
  Filled 2017-07-18: qty 1

## 2017-07-18 MED ORDER — GABAPENTIN 300 MG PO CAPS
300.0000 mg | ORAL_CAPSULE | Freq: Two times a day (BID) | ORAL | Status: DC
  Administered 2017-07-18 – 2017-07-19 (×2): 300 mg via ORAL
  Filled 2017-07-18 (×2): qty 1

## 2017-07-18 MED ORDER — CEFAZOLIN SODIUM-DEXTROSE 1-4 GM/50ML-% IV SOLN
1.0000 g | Freq: Four times a day (QID) | INTRAVENOUS | Status: AC
  Administered 2017-07-18 – 2017-07-19 (×3): 1 g via INTRAVENOUS
  Filled 2017-07-18 (×4): qty 50

## 2017-07-18 MED ORDER — OXYCODONE HCL 5 MG/5ML PO SOLN: 5.0000 mg | Freq: Once | ORAL | Status: DC | PRN

## 2017-07-18 MED ORDER — POTASSIUM CHLORIDE IN NACL 20-0.9 MEQ/L-% IV SOLN
INTRAVENOUS | Status: DC
  Administered 2017-07-19: 05:00:00 via INTRAVENOUS
  Filled 2017-07-18: qty 1000

## 2017-07-18 MED ORDER — LACTATED RINGERS IV SOLN
INTRAVENOUS | Status: DC
  Administered 2017-07-18 (×2): via INTRAVENOUS

## 2017-07-18 MED ORDER — DEXAMETHASONE SODIUM PHOSPHATE 10 MG/ML IJ SOLN
INTRAMUSCULAR | Status: AC
  Filled 2017-07-18: qty 1

## 2017-07-18 MED ORDER — SCOPOLAMINE 1 MG/3DAYS TD PT72
MEDICATED_PATCH | TRANSDERMAL | Status: DC | PRN
  Administered 2017-07-18: 1 via TRANSDERMAL

## 2017-07-18 MED ORDER — METOCLOPRAMIDE HCL 5 MG/ML IJ SOLN: 10.0000 mg | Freq: Once | INTRAMUSCULAR | Status: DC | PRN

## 2017-07-18 MED ORDER — DEXMEDETOMIDINE HCL 200 MCG/2ML IV SOLN
0.4000 ug/kg/h | INTRAVENOUS | Status: AC
  Administered 2017-07-18: .5 ug/kg/h via INTRAVENOUS
  Filled 2017-07-18: qty 2

## 2017-07-18 MED ORDER — SUGAMMADEX SODIUM 200 MG/2ML IV SOLN
INTRAVENOUS | Status: AC
  Filled 2017-07-18: qty 2

## 2017-07-18 MED ORDER — KETOROLAC TROMETHAMINE 30 MG/ML IJ SOLN
30.0000 mg | Freq: Four times a day (QID) | INTRAMUSCULAR | Status: DC
  Administered 2017-07-18 – 2017-07-21 (×11): 30 mg via INTRAVENOUS
  Filled 2017-07-18 (×11): qty 1

## 2017-07-18 MED ORDER — DEXAMETHASONE SODIUM PHOSPHATE 10 MG/ML IJ SOLN
INTRAMUSCULAR | Status: DC | PRN
  Administered 2017-07-18: 10 mg via INTRAVENOUS

## 2017-07-18 MED ORDER — BISACODYL 5 MG PO TBEC: 5.0000 mg | DELAYED_RELEASE_TABLET | Freq: Every day | ORAL | Status: DC | PRN

## 2017-07-18 MED ORDER — KETOROLAC TROMETHAMINE 30 MG/ML IJ SOLN
INTRAMUSCULAR | Status: AC
  Administered 2017-07-18: 30 mg via INTRAVENOUS
  Filled 2017-07-18: qty 1

## 2017-07-18 MED ORDER — ENOXAPARIN SODIUM 40 MG/0.4ML ~~LOC~~ SOLN
40.0000 mg | SUBCUTANEOUS | Status: DC
  Administered 2017-07-19 – 2017-07-21 (×3): 40 mg via SUBCUTANEOUS
  Filled 2017-07-18 (×3): qty 0.4

## 2017-07-18 MED ORDER — FENTANYL CITRATE (PF) 100 MCG/2ML IJ SOLN
INTRAMUSCULAR | Status: DC
Start: 2017-07-18 — End: 2017-07-18
  Filled 2017-07-18: qty 2

## 2017-07-18 MED ORDER — SCOPOLAMINE 1 MG/3DAYS TD PT72
MEDICATED_PATCH | TRANSDERMAL | Status: AC
  Filled 2017-07-18: qty 1

## 2017-07-18 SURGICAL SUPPLY — 101 items
ASMB TUBE 520 STRL RMR IRR (MISCELLANEOUS) ×1
BANDAGE ACE 4X5 VEL STRL LF (GAUZE/BANDAGES/DRESSINGS) ×3 IMPLANT
BANDAGE ACE 6X5 VEL STRL LF (GAUZE/BANDAGES/DRESSINGS) ×3 IMPLANT
BIT DRILL 100X2.5XANTM LCK (BIT) IMPLANT
BIT DRILL CAL (BIT) IMPLANT
BIT DRL 100X2.5XANTM LCK (BIT) ×1
BLADE CLIPPER SURG (BLADE) IMPLANT
BNDG GAUZE ELAST 4 BULKY (GAUZE/BANDAGES/DRESSINGS) ×3 IMPLANT
BONE CANC CHIPS 40CC CAN1/2 (Bone Implant) ×3 IMPLANT
BRUSH SCRUB SURG 4.25 DISP (MISCELLANEOUS) ×6 IMPLANT
CANISTER SUCT 3000ML PPV (MISCELLANEOUS) ×3 IMPLANT
CHIPS CANC BONE 40CC CAN1/2 (Bone Implant) ×1 IMPLANT
CLIP LOCKING FOR RIA (CLIP) ×2 IMPLANT
COVER SURGICAL LIGHT HANDLE (MISCELLANEOUS) ×3 IMPLANT
DRAPE C-ARM 42X72 X-RAY (DRAPES) ×3 IMPLANT
DRAPE C-ARMOR (DRAPES) ×3 IMPLANT
DRAPE IMP U-DRAPE 54X76 (DRAPES) ×3 IMPLANT
DRAPE ORTHO SPLIT 77X108 STRL (DRAPES) ×9
DRAPE SURG ORHT 6 SPLT 77X108 (DRAPES) ×3 IMPLANT
DRAPE U-SHAPE 47X51 STRL (DRAPES) ×3 IMPLANT
DRILL BIT 2.5MM (BIT) ×3
DRILL BIT CAL (BIT) ×3
DRSG ADAPTIC 3X8 NADH LF (GAUZE/BANDAGES/DRESSINGS) ×3 IMPLANT
DRSG PAD ABDOMINAL 8X10 ST (GAUZE/BANDAGES/DRESSINGS) ×10 IMPLANT
ELECT REM PT RETURN 9FT ADLT (ELECTROSURGICAL) ×3
ELECTRODE REM PT RTRN 9FT ADLT (ELECTROSURGICAL) ×1 IMPLANT
EVACUATOR 1/8 PVC DRAIN (DRAIN) IMPLANT
EVACUATOR 3/16  PVC DRAIN (DRAIN)
EVACUATOR 3/16 PVC DRAIN (DRAIN) IMPLANT
GAUZE SPONGE 4X4 12PLY STRL (GAUZE/BANDAGES/DRESSINGS) ×3 IMPLANT
GLOVE BIO SURGEON STRL SZ7.5 (GLOVE) ×5 IMPLANT
GLOVE BIO SURGEON STRL SZ8 (GLOVE) ×5 IMPLANT
GLOVE BIOGEL PI IND STRL 7.5 (GLOVE) ×1 IMPLANT
GLOVE BIOGEL PI IND STRL 8 (GLOVE) ×1 IMPLANT
GLOVE BIOGEL PI INDICATOR 7.5 (GLOVE) ×6
GLOVE BIOGEL PI INDICATOR 8 (GLOVE) ×4
GLOVE ECLIPSE 7.0 STRL STRAW (GLOVE) ×2 IMPLANT
GOWN STRL REUS W/ TWL LRG LVL3 (GOWN DISPOSABLE) ×2 IMPLANT
GOWN STRL REUS W/ TWL XL LVL3 (GOWN DISPOSABLE) ×1 IMPLANT
GOWN STRL REUS W/TWL LRG LVL3 (GOWN DISPOSABLE) ×6
GOWN STRL REUS W/TWL XL LVL3 (GOWN DISPOSABLE) ×3
GRAFT BNE CHIP CANC 1-8 40 (Bone Implant) IMPLANT
GRAFT FILTER FOR RIA 520 LGTH (MISCELLANEOUS) ×2 IMPLANT
GUIDEWIRE 3.2X400 (WIRE) ×2 IMPLANT
IMMOBILIZER KNEE 22 UNIV (SOFTGOODS) ×2 IMPLANT
K-WIRE ACE 1.6X6 (WIRE) ×6
KIT BASIN OR (CUSTOM PROCEDURE TRAY) ×3 IMPLANT
KIT ROOM TURNOVER OR (KITS) ×3 IMPLANT
KWIRE ACE 1.6X6 (WIRE) IMPLANT
NDL SUT 2 .5 CRC MAYO 1.732X (NEEDLE) IMPLANT
NDL SUT 6 .5 CRC .975X.05 MAYO (NEEDLE) IMPLANT
NEEDLE 22X1 1/2 (OR ONLY) (NEEDLE) ×2 IMPLANT
NEEDLE MAYO TAPER (NEEDLE) ×6
NS IRRIG 1000ML POUR BTL (IV SOLUTION) ×3 IMPLANT
PACK TOTAL JOINT (CUSTOM PROCEDURE TRAY) ×3 IMPLANT
PACK UNIVERSAL I (CUSTOM PROCEDURE TRAY) ×3 IMPLANT
PAD ARMBOARD 7.5X6 YLW CONV (MISCELLANEOUS) ×6 IMPLANT
PAD CAST 4YDX4 CTTN HI CHSV (CAST SUPPLIES) ×1 IMPLANT
PADDING CAST COTTON 4X4 STRL (CAST SUPPLIES) ×3
PADDING CAST COTTON 6X4 STRL (CAST SUPPLIES) ×3 IMPLANT
PLATE LOCK 9H STD RT PROX TIB (Plate) ×2 IMPLANT
REAMER HEAD 14.5 F/IRRIGATION (MISCELLANEOUS) ×2 IMPLANT
REAMER ROD DEEP FLUTE 2.5X950 (INSTRUMENTS) ×2 IMPLANT
SCREW CORT FT 32X3.5XNONLOCK (Screw) IMPLANT
SCREW CORTICAL 3.5MM  32MM (Screw) ×4 IMPLANT
SCREW CORTICAL 3.5MM 32MM (Screw) ×2 IMPLANT
SCREW CORTICAL 3.5MM 36MM (Screw) ×2 IMPLANT
SCREW LOCK 3.5X65 DIST TIB (Screw) ×4 IMPLANT
SCREW LOCK 3.5X70 DIST TIB (Screw) ×2 IMPLANT
SCREW LOCK CORT STAR 3.5X24 (Screw) ×2 IMPLANT
SCREW LOCK CORT STAR 3.5X26 (Screw) ×2 IMPLANT
SCREW LOCK CORT STAR 3.5X65 (Screw) ×4 IMPLANT
SCREW LOCK CORT STAR 3.5X70 (Screw) ×2 IMPLANT
SCREW LP 3.5X65MM (Screw) ×2 IMPLANT
SCREW LP 3.5X70MM (Screw) ×2 IMPLANT
SPONGE LAP 18X18 X RAY DECT (DISPOSABLE) ×3 IMPLANT
STAPLER VISISTAT 35W (STAPLE) ×3 IMPLANT
SUCTION FRAZIER HANDLE 10FR (MISCELLANEOUS) ×2
SUCTION TUBE FRAZIER 10FR DISP (MISCELLANEOUS) ×1 IMPLANT
SUT ETHILON 2 0 FS 18 (SUTURE) ×2 IMPLANT
SUT ETHILON 2 0 PSLX (SUTURE) ×8 IMPLANT
SUT ETHILON 3 0 PS 1 (SUTURE) ×2 IMPLANT
SUT PDS AB 0 CT 36 (SUTURE) ×2 IMPLANT
SUT PDS AB 1 CT  36 (SUTURE) ×2
SUT PDS AB 1 CT 36 (SUTURE) IMPLANT
SUT PDS AB 2-0 CT1 27 (SUTURE) ×4 IMPLANT
SUT PROLENE 0 CT 1 30 (SUTURE) ×4 IMPLANT
SUT PROLENE 0 CT 2 (SUTURE) IMPLANT
SUT VIC AB 0 CT1 27 (SUTURE) ×6
SUT VIC AB 0 CT1 27XBRD ANBCTR (SUTURE) ×2 IMPLANT
SUT VIC AB 1 CT1 27 (SUTURE) ×6
SUT VIC AB 1 CT1 27XBRD ANBCTR (SUTURE) ×2 IMPLANT
SUT VIC AB 2-0 CT1 27 (SUTURE) ×6
SUT VIC AB 2-0 CT1 TAPERPNT 27 (SUTURE) ×2 IMPLANT
SYR 20ML ECCENTRIC (SYRINGE) IMPLANT
TAPE CLOTH SURG 4X10 WHT LF (GAUZE/BANDAGES/DRESSINGS) ×2 IMPLANT
TOWEL OR 17X24 6PK STRL BLUE (TOWEL DISPOSABLE) ×3 IMPLANT
TOWEL OR 17X26 10 PK STRL BLUE (TOWEL DISPOSABLE) ×6 IMPLANT
TRAY FOLEY W/METER SILVER 16FR (SET/KITS/TRAYS/PACK) ×2 IMPLANT
TUBE ASSEMBLY RIA STERILE (MISCELLANEOUS) ×2 IMPLANT
WATER STERILE IRR 1000ML POUR (IV SOLUTION) ×2 IMPLANT

## 2017-07-18 NOTE — Anesthesia Procedure Notes (Signed)
Procedure Name: Intubation Performed by: Freddie Breech, CRNA Pre-anesthesia Checklist: Patient identified, Emergency Drugs available, Suction available, Patient being monitored and Timeout performed Patient Re-evaluated:Patient Re-evaluated prior to induction Oxygen Delivery Method: Circle system utilized Preoxygenation: Pre-oxygenation with 100% oxygen Induction Type: IV induction Ventilation: Mask ventilation without difficulty Laryngoscope Size: Mac and 3 Grade View: Grade I Tube type: Oral Tube size: 7.0 mm Number of attempts: 1 Airway Equipment and Method: Stylet Placement Confirmation: ETT inserted through vocal cords under direct vision,  positive ETCO2 and breath sounds checked- equal and bilateral Secured at: 20 cm Tube secured with: Tape Dental Injury: Teeth and Oropharynx as per pre-operative assessment

## 2017-07-18 NOTE — Progress Notes (Signed)
Orthopedic Tech Progress Note Patient Details:  Dawn Foley [birth year] [id number]  Ortho Devices Ortho Device/Splint Location: footsie roll Ortho Device/Splint Interventions: Ordered, Application   Braulio Bosch 07/18/2017, 9:28 PM

## 2017-07-18 NOTE — Anesthesia Postprocedure Evaluation (Signed)
Anesthesia Post Note  Patient: Dawn Foley  Procedure(s) Performed: REPAIR NONUNION WITH RIA (Right )     Patient location during evaluation: PACU Anesthesia Type: General Level of consciousness: awake, awake and alert and oriented Pain management: pain level controlled Vital Signs Assessment: post-procedure vital signs reviewed and stable Respiratory status: spontaneous breathing, nonlabored ventilation and respiratory function stable Cardiovascular status: blood pressure returned to baseline Anesthetic complications: no    Last Vitals:  Vitals:   07/18/17 1700 07/18/17 1715  BP: (!) 81/47 (!) 89/48  Pulse: 90 76  Resp: 19 16  Temp: (!) 36.1 C 36.4 C  SpO2: 100% 100%    Last Pain:  Vitals:   07/18/17 1715  TempSrc: Oral  PainSc:                  [name]

## 2017-07-18 NOTE — Brief Op Note (Signed)
07/18/2017  2:52 PM  PATIENT:  Dawn Foley  31 y.o. 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 AVULSED LATERAL MENISCUS  PROCEDURE:  Procedure(s): 1. REPAIR NONUNION TIBIA WITH REAMED INTRAMEDULLARY ASPIRATION 2. OPEN REDUCTION INTERNAL FIXATION OF BICONDYLAR TIBIAL PLATEAU 3. ARTHROTOMY WITH REPAIR OF LATERAL MENISCUS WITH EIGHT VERTICAL MATTRESS SUTURES 4. STRESS FLOURO FOR ASSESSMENT OF LIGAMENTOUS STABILITY AFTER BONE REPAIR  SURGEON:  Surgeon(s) and Role:    Altamese [place]ves, MD - Primary  PHYSICIAN ASSISTANT: 1. KEITH PAUL, PA-C,  2. PA Student  ANESTHESIA:   general  EBL:  125 mL   BLOOD ADMINISTERED:none  DRAINS: none   LOCAL MEDICATIONS USED:  NONE  SPECIMEN:  No Specimen  DISPOSITION OF SPECIMEN:  N/A  COUNTS:  YES  TOURNIQUET:  * Missing tourniquet times found for documented tourniquets in log: [id number] *  DICTATION: .Other Dictation: Dictation Number [id number]  PLAN OF CARE: Admit to inpatient   PATIENT DISPOSITION:  PACU - hemodynamically stable.   Delay start of Pharmacological VTE agent (>24hrs) due to surgical blood loss or risk of bleeding: no

## 2017-07-18 NOTE — Anesthesia Procedure Notes (Signed)
Anesthesia Regional Block: Popliteal block   Pre-Anesthetic Checklist: ,, timeout performed, Correct Patient, Correct Site, Correct Laterality, Correct Procedure, Correct Position, site marked, Risks and benefits discussed,  Surgical consent,  Pre-op evaluation,  At surgeon's request and post-op pain management  Laterality: Right  Prep: chloraprep       Needles:  Injection technique: Single-shot  Needle Type: Echogenic Stimulator Needle     Needle Length: 9cm  Needle Gauge: 21     Additional Needles:   Procedures: Doppler guided, nerve stimulator,,, ultrasound used (permanent image in chart),,,,  Narrative:  Start time: 07/18/2017 9:55 AM End time: 07/18/2017 10:00 AM Injection made incrementally with aspirations every 5 mL.  Performed by: Personally   Additional Notes: 20 cc 0.5% Bupivacaine with 1:200 epi injected easily

## 2017-07-18 NOTE — Anesthesia Procedure Notes (Signed)
Anesthesia Regional Block: Adductor canal block   Pre-Anesthetic Checklist: ,, timeout performed, Correct Patient, Correct Site, Correct Laterality, Correct Procedure, Correct Position, site marked, Risks and benefits discussed,  Surgical consent,  Pre-op evaluation,  At surgeon's request and post-op pain management  Laterality: Right  Prep: chloraprep       Needles:   Needle Type: Echogenic Stimulator Needle     Needle Length: 9cm  Needle Gauge: 21     Additional Needles:   Procedures:,,,, ultrasound used (permanent image in chart),,,,  Narrative:  Start time: 07/18/2017 10:00 AM End time: 07/18/2017 10:05 AM Injection made incrementally with aspirations every 5 mL.  Performed by: Personally   Additional Notes: 20 cc 0.5% Ropivacaine injected easily

## 2017-07-18 NOTE — Progress Notes (Signed)
Pt adm to PACU with Precedex infusing at 0.77mcg/kg/hr; and NEO drip at 77mcg/min. She is very sedated, Dr. Linna Caprice & Dr Marcelino Scot at bedside. Will continue to monitor closely.

## 2017-07-18 NOTE — Progress Notes (Signed)
I have seen and examined the patient. I agree with the findings of Mr. Eddie Dibbles nearby in interval H&P.  I discussed with the patient and her mother the risks and benefits of surgery for her right tibial plateau malunion and nonunion, including the possibility of infection, nerve injury, vessel injury, wound breakdown, arthritis, symptomatic hardware, DVT/ PE, loss of motion, malunion, nonunion, and need for further surgery among others.  We also specifically discussed the risks of complications from graft harvest from the right femur including the possibility of fracture.She and her mother acknowledged these risks and wished to proceed.   Rozanna Box, MD 07/18/2017 10:29 AM

## 2017-07-18 NOTE — Interval H&P Note (Signed)
History and Physical Interval Note:  07/18/2017 8:32 AM  Dawn Foley  has presented today for surgery, with the diagnosis of right  Proximal tibia non union malunion  The various methods of treatment have been discussed with the patient and family. After consideration of risks, benefits and other options for treatment, the patient has consented to  Procedure(s): REPAIR NONUNION WITH RIA (Right) as a surgical intervention .  The patient's history has been reviewed, patient examined, no change in status, stable for surgery.  I have reviewed the patient's chart and labs.  Questions were answered to the patient's satisfaction.     Dawn Foley 31 year old white female with complex medical history including nicotine use, chronic alcoholism, hepatitis C history who sustained a severe right proximal tibia fracture back in August 2018 with acute compartment syndrome necessitating fasciotomies and spanning external fixator application.  Due to severe soft tissue swelling as well as wound infections we are unable to proceed with a definitive fixation.  Patient underwent removal of her external fixator approximately 3 and half weeks ago.  There is been no interval change since her last surgery.  She presents today for complex reconstruction of her right proximal tibia nonunion with autograft harvested from her right femoral canal utilizing reamed intramedullary aspirate.  Risks and benefits have been reviewed with the patient she wishes to proceed.  Postoperatively patient will require inpatient admission for pain control and therapies.  She remains at continued risk for complications given her social history and patient will also likely progressed to rapid need for total knee arthroplasty however she does need this procedure in order to create stable bone stock for that total knee to occur.  Physical exam is stable Right lower extremity wounds are well-healed.  No signs of infection. Fasciotomy wounds are  healed Swelling has improved dramatically Motor and sensory functions are improved as well. Tenderness and instability of her knee are noted Palpable dorsalis pedis pulse Extremities warm    Jari Pigg, PA-C [facility] [phone] (C) [phone] (O) 07/18/2017 8:37 AM

## 2017-07-18 NOTE — Transfer of Care (Signed)
Immediate Anesthesia Transfer of Care Note  Patient: ZAMORIA BOSS  Procedure(s) Performed: REPAIR NONUNION WITH RIA (Right )  Patient Location: PACU  Anesthesia Type:General  Level of Consciousness: sedated and patient cooperative  Airway & Oxygen Therapy: Patient Spontanous Breathing and Patient connected to face mask oxygen  Post-op Assessment: Report given to RN and Post -op Vital signs reviewed and stable  Post vital signs: Reviewed  Last Vitals:  Vitals:   07/18/17 1025 07/18/17 1545  BP: (!) 71/41   Pulse: 88 72  Resp: 19 14  Temp:  36.6 C  SpO2: 100%     Last Pain:  Vitals:   07/18/17 0955  TempSrc:   PainSc: 10-Worst pain ever      Patients Stated Pain Goal: 3 (80/16/55 3748)  Complications: No apparent anesthesia complications

## 2017-07-18 NOTE — Anesthesia Postprocedure Evaluation (Signed)
Anesthesia Post Note  Patient: Dawn Foley  Procedure(s) Performed: REPAIR NONUNION WITH RIA (Right )     Patient location during evaluation: PACU Anesthesia Type: General Level of consciousness: awake Pain management: pain level controlled Respiratory status: spontaneous breathing Cardiovascular status: stable Anesthetic complications: no    Last Vitals:  Vitals:   07/18/17 1645 07/18/17 1700  BP: (!) 82/43 (!) 81/47  Pulse:  90  Resp:  19  Temp:  (!) 36.1 C  SpO2:  100%    Last Pain:  Vitals:   07/18/17 0955  TempSrc:   PainSc: 10-Worst pain ever                 Keah Lamba

## 2017-07-18 NOTE — Progress Notes (Signed)
Weaning Precedex & NEO drips. Dr Linna Caprice & Nyoka Cowden here to check pt.

## 2017-07-18 NOTE — Anesthesia Preprocedure Evaluation (Addendum)
Anesthesia Evaluation  Patient identified by MRN, date of birth, ID band Patient awake    Reviewed: Allergy & Precautions, NPO status , Patient's Chart, lab work & pertinent test results  Airway Mallampati: II  TM Distance: >3 FB Neck ROM: Full    Dental  (+) Teeth Intact, Dental Advisory Given   Pulmonary Current Smoker,    breath sounds clear to auscultation       Cardiovascular  Rhythm:Regular Rate:Normal     Neuro/Psych    GI/Hepatic   Endo/Other    Renal/GU      Musculoskeletal   Abdominal   Peds  Hematology   Anesthesia Other Findings   Reproductive/Obstetrics                            Anesthesia Physical Anesthesia Plan  ASA: III  Anesthesia Plan: General and Regional   Post-op Pain Management:    Induction: Intravenous  PONV Risk Score and Plan: 1 and Ondansetron and Dexamethasone  Airway Management Planned: Oral ETT  Additional Equipment:   Intra-op Plan:   Post-operative Plan: Extubation in OR  Informed Consent: I have reviewed the patients History and Physical, chart, labs and discussed the procedure including the risks, benefits and alternatives for the proposed anesthesia with the patient or authorized representative who has indicated his/her understanding and acceptance.   Dental advisory given  Plan Discussed with: CRNA and Anesthesiologist  Anesthesia Plan Comments:         Anesthesia Quick Evaluation

## 2017-07-19 ENCOUNTER — Encounter (HOSPITAL_COMMUNITY): Payer: Self-pay | Admitting: Orthopedic Surgery

## 2017-07-19 DIAGNOSIS — F419 Anxiety disorder, unspecified: Secondary | ICD-10-CM | POA: Diagnosis present

## 2017-07-19 DIAGNOSIS — B1920 Unspecified viral hepatitis C without hepatic coma: Secondary | ICD-10-CM | POA: Diagnosis present

## 2017-07-19 DIAGNOSIS — K219 Gastro-esophageal reflux disease without esophagitis: Secondary | ICD-10-CM | POA: Diagnosis present

## 2017-07-19 LAB — CBC
HCT: 26 % — ABNORMAL LOW (ref 36.0–46.0)
HEMOGLOBIN: 7.6 g/dL — AB (ref 12.0–15.0)
MCH: 22.9 pg — AB (ref 26.0–34.0)
MCHC: 29.2 g/dL — ABNORMAL LOW (ref 30.0–36.0)
MCV: 78.3 fL (ref 78.0–100.0)
PLATELETS: 205 10*3/uL (ref 150–400)
RBC: 3.32 MIL/uL — AB (ref 3.87–5.11)
RDW: 19 % — ABNORMAL HIGH (ref 11.5–15.5)
WBC: 10.4 10*3/uL (ref 4.0–10.5)

## 2017-07-19 LAB — URINE CULTURE

## 2017-07-19 LAB — VITAMIN D 25 HYDROXY (VIT D DEFICIENCY, FRACTURES): Vit D, 25-Hydroxy: 22.4 ng/mL — ABNORMAL LOW (ref 30.0–100.0)

## 2017-07-19 LAB — BASIC METABOLIC PANEL
ANION GAP: 8 (ref 5–15)
BUN: 6 mg/dL (ref 6–20)
CALCIUM: 8.1 mg/dL — AB (ref 8.9–10.3)
CO2: 24 mmol/L (ref 22–32)
CREATININE: 0.66 mg/dL (ref 0.44–1.00)
Chloride: 105 mmol/L (ref 101–111)
Glucose, Bld: 112 mg/dL — ABNORMAL HIGH (ref 65–99)
Potassium: 3.6 mmol/L (ref 3.5–5.1)
SODIUM: 137 mmol/L (ref 135–145)

## 2017-07-19 LAB — CALCIUM, IONIZED: CALCIUM, IONIZED, SERUM: 5.1 mg/dL (ref 4.5–5.6)

## 2017-07-19 LAB — CALCITRIOL (1,25 DI-OH VIT D): Vit D, 1,25-Dihydroxy: 94.6 pg/mL — ABNORMAL HIGH (ref 19.9–79.3)

## 2017-07-19 LAB — PTH, INTACT AND CALCIUM
CALCIUM TOTAL (PTH): 9.4 mg/dL (ref 8.7–10.2)
PTH: 22 pg/mL (ref 15–65)

## 2017-07-19 MED ORDER — VITAMIN C 500 MG PO TABS
500.0000 mg | ORAL_TABLET | Freq: Every day | ORAL | Status: DC
  Administered 2017-07-19 – 2017-07-21 (×3): 500 mg via ORAL
  Filled 2017-07-19 (×3): qty 1

## 2017-07-19 MED ORDER — BENZOCAINE 10 % MT GEL
Freq: Four times a day (QID) | OROMUCOSAL | Status: DC | PRN
  Administered 2017-07-19: 22:00:00 via OROMUCOSAL
  Administered 2017-07-20: 1 via OROMUCOSAL
  Filled 2017-07-19 (×2): qty 9

## 2017-07-19 MED ORDER — ADULT MULTIVITAMIN W/MINERALS CH
1.0000 | ORAL_TABLET | Freq: Every day | ORAL | Status: DC
  Administered 2017-07-19 – 2017-07-21 (×3): 1 via ORAL
  Filled 2017-07-19 (×3): qty 1

## 2017-07-19 MED ORDER — LORAZEPAM 1 MG PO TABS
1.0000 mg | ORAL_TABLET | Freq: Four times a day (QID) | ORAL | Status: DC | PRN
  Filled 2017-07-19: qty 1

## 2017-07-19 MED ORDER — BLISTEX MEDICATED EX OINT
TOPICAL_OINTMENT | CUTANEOUS | Status: DC | PRN
  Administered 2017-07-19: 18:00:00 via TOPICAL
  Filled 2017-07-19: qty 6.3

## 2017-07-19 MED ORDER — LORAZEPAM 2 MG/ML IJ SOLN
1.0000 mg | Freq: Four times a day (QID) | INTRAMUSCULAR | Status: DC | PRN
Start: 2017-07-19 — End: 2017-07-21

## 2017-07-19 MED ORDER — LORAZEPAM 1 MG PO TABS
0.0000 mg | ORAL_TABLET | Freq: Four times a day (QID) | ORAL | Status: AC
  Administered 2017-07-19: 1 mg via ORAL
  Administered 2017-07-19 – 2017-07-20 (×3): 4 mg via ORAL
  Administered 2017-07-20: 1 mg via ORAL
  Administered 2017-07-21 (×2): 4 mg via ORAL
  Filled 2017-07-19 (×4): qty 4
  Filled 2017-07-19: qty 1
  Filled 2017-07-19: qty 4
  Filled 2017-07-19: qty 3

## 2017-07-19 MED ORDER — PHENOL 1.4 % MT LIQD: 1.0000 | OROMUCOSAL | Status: DC | PRN

## 2017-07-19 MED ORDER — GABAPENTIN 300 MG PO CAPS
300.0000 mg | ORAL_CAPSULE | Freq: Three times a day (TID) | ORAL | Status: DC
  Administered 2017-07-19 – 2017-07-21 (×6): 300 mg via ORAL
  Filled 2017-07-19 (×6): qty 1

## 2017-07-19 MED ORDER — FOLIC ACID 1 MG PO TABS
1.0000 mg | ORAL_TABLET | Freq: Every day | ORAL | Status: DC
  Administered 2017-07-19 – 2017-07-21 (×3): 1 mg via ORAL
  Filled 2017-07-19 (×3): qty 1

## 2017-07-19 MED ORDER — LIP MEDEX EX OINT: 1.0000 "application " | TOPICAL_OINTMENT | CUTANEOUS | Status: DC | PRN

## 2017-07-19 MED ORDER — VITAMIN B-1 100 MG PO TABS
100.0000 mg | ORAL_TABLET | Freq: Every day | ORAL | Status: DC
  Administered 2017-07-19 – 2017-07-21 (×3): 100 mg via ORAL
  Filled 2017-07-19 (×3): qty 1

## 2017-07-19 MED ORDER — ALUM & MAG HYDROXIDE-SIMETH 200-200-20 MG/5ML PO SUSP
30.0000 mL | ORAL | Status: DC | PRN
Start: 2017-07-19 — End: 2017-07-21

## 2017-07-19 MED ORDER — VITAMIN D 1000 UNITS PO TABS
2000.0000 [IU] | ORAL_TABLET | Freq: Two times a day (BID) | ORAL | Status: DC
  Administered 2017-07-19 – 2017-07-21 (×5): 2000 [IU] via ORAL
  Filled 2017-07-19 (×5): qty 2

## 2017-07-19 MED ORDER — THIAMINE HCL 100 MG/ML IJ SOLN: 100.0000 mg | Freq: Every day | INTRAMUSCULAR | Status: DC

## 2017-07-19 MED ORDER — LORAZEPAM 1 MG PO TABS: 0.0000 mg | ORAL_TABLET | Freq: Two times a day (BID) | ORAL | Status: DC

## 2017-07-19 MED ORDER — CALCIUM CITRATE 950 (200 CA) MG PO TABS
200.0000 mg | ORAL_TABLET | Freq: Two times a day (BID) | ORAL | Status: DC
Start: 2017-07-19 — End: 2017-07-21
  Administered 2017-07-19 – 2017-07-21 (×5): 200 mg via ORAL
  Filled 2017-07-19 (×6): qty 1

## 2017-07-19 MED ORDER — GUAIFENESIN-DM 100-10 MG/5ML PO SYRP: 5.0000 mL | ORAL_SOLUTION | ORAL | Status: DC | PRN

## 2017-07-19 NOTE — Evaluation (Signed)
Physical Therapy Evaluation Patient Details Name: Dawn Foley MRN: [id number] DOB: 1986/03/28 Today's Date: 07/19/2017   History of Present Illness  R tibia nonunion with repair. Pt. had repair of meniscus. 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     Clinical Impression  Patient is s/p above surgery resulting in functional limitations due to the deficits listed below (see PT Problem List). PTA, pt was using w/c for mobility for 4 months. Prior to initial injury, pt was independent  with all mobility. Upon eval, pt presents with post op pain and weakness, severe anxiety limiting her potential with mobility. Session focused on transfers and gait training. Pt min assist for transfers and gait with RW. Would like to progress activity tolerance and familiarity with ambulation next visit. Patient will benefit from skilled PT to increase their independence and safety with mobility to allow discharge to the venue listed below.       Follow Up Recommendations Home health PT;DC plan and follow up therapy as arranged by surgeon;Supervision for mobility/OOB    Equipment Recommendations  Rolling walker with 5" wheels    Recommendations for Other Services       Precautions / Restrictions Precautions Precautions: Fall Restrictions Weight Bearing Restrictions: Yes RLE Weight Bearing: Non weight bearing      Mobility  Bed Mobility Overal bed mobility: Modified Independent Bed Mobility: Supine to Sit           General bed mobility comments: lifts her RLE to swing to EOB  Transfers Overall transfer level: Needs assistance Equipment used: Rolling walker (2 wheeled) Transfers: Sit to/from [facility] Sit to Stand: Min assist Stand pivot transfers: Min assist       General transfer comment: Cues for hand placement and safe use of RW. Pt anxious with mobility and self limits. Min A to help power up and stabilize inside walker.    Ambulation/Gait Ambulation/Gait assistance: Min guard Ambulation Distance (Feet): 40 Feet Assistive device: Rolling walker (2 wheeled);Crutches Gait Pattern/deviations: Step-to pattern;Decreased stride length;Antalgic Gait velocity: decreased   General Gait Details: Pt unable to use crutches due to anxiety, safer with suppoer of RW. cued for sequencing and WB status. Pt ambulates 5-10 feet then becomes anxious she wil fall and self limits. reassured her safety   Stairs            Wheelchair Mobility    Modified Rankin (Stroke Patients Only)       Balance Overall balance assessment: Needs assistance Sitting-balance support: Feet supported;No upper extremity supported Sitting balance-Leahy Scale: Good     Standing balance support: Bilateral upper extremity supported Standing balance-Leahy Scale: Poor Standing balance comment: Heavy reliance on BUE support in static and dynamic balance. Self limited by anxiety of falling.                              Pertinent Vitals/Pain Pain Assessment: Faces Faces Pain Scale: Hurts even more Pain Location: R LE Pain Descriptors / Indicators: Constant;Shooting Pain Intervention(s): Limited activity within patient's tolerance;Monitored during session    [place] expects to be discharged to:: Private residence Living Arrangements: (ex ex boyfriend) Available Help at Discharge: Available PRN/intermittently Type of Home: Mobile home Home Access: Ramped entrance     [place]: One [place]: Bedside commode;Tub bench;Wheelchair - manual;Wheelchair - power Additional Comments: Pt wants to go home to her ex ex boyfriends and receive home health therapy  Prior Function Level of Independence: Independent with assistive device(s)         Comments: Pt. required assist with bathing and dressing since accident.Pt. has been w/c level for 4 months     Hand Dominance         Extremity/Trunk Assessment   Upper Extremity Assessment Upper Extremity Assessment: Overall WFL for tasks assessed    Lower Extremity Assessment Lower Extremity Assessment: RLE deficits/detail;LLE deficits/detail RLE Deficits / Details: weakness and pain post op  LLE Deficits / Details: strength 3+/5 globally       Communication   Communication: No difficulties  Cognition Arousal/Alertness: Awake/alert Behavior During Therapy: WFL for tasks assessed/performed Overall Cognitive Status: Within Functional Limits for tasks assessed                                        General Comments General comments (skin integrity, edema, etc.): VSS throughout session. Extensive time discussing benefits of mobility and building confidence with ambulation. Pt ambulated with RW and min asssist to bathroom and used 3 in 1 commode.      Exercises General Exercises - Lower Extremity Long Arc Quad: AROM;Left;10 reps Straight Leg Raises: AROM;Both;10 reps   Assessment/Plan    PT Assessment Patient needs continued PT services  PT Problem List Decreased strength;Decreased range of motion;Decreased activity tolerance;Decreased balance;Decreased mobility;Decreased knowledge of use of DME;Pain       PT Treatment Interventions DME instruction;Gait training;Functional mobility training;Therapeutic activities;Balance training;Therapeutic exercise    PT Goals (Current goals can be found in the Care Plan section)  Acute Rehab PT Goals Patient Stated Goal: Pt wants to d/c to HHPT  PT Goal Formulation: With patient Potential to Achieve Goals: Good    Frequency Min 5X/week   Barriers to discharge        Co-evaluation               AM-PAC PT "6 Clicks" Daily Activity  Outcome Measure Difficulty turning over in bed (including adjusting bedclothes, sheets and blankets)?: A Lot Difficulty moving from lying on back to sitting on the side of the bed? : A Lot Difficulty sitting  down on and standing up from a chair with arms (e.g., wheelchair, bedside commode, etc,.)?: A Little Help needed moving to and from a bed to chair (including a wheelchair)?: A Little Help needed walking in hospital room?: A Little Help needed climbing 3-5 steps with a railing? : A Lot 6 Click Score: 15    End of Session Equipment Utilized During Treatment: Gait belt Activity Tolerance: Patient limited by pain;Treatment limited secondary to agitation Patient left: in bed;with nursing/sitter in room;with call bell/phone within reach Nurse Communication: Mobility status PT Visit Diagnosis: Unsteadiness on feet (R26.81);Other abnormalities of gait and mobility (R26.89);Pain;Muscle weakness (generalized) (M62.81) Pain - Right/Left: Right    Time: 0017-4944 PT Time Calculation (min) (ACUTE ONLY): 40 min   Charges:   PT Evaluation $PT Eval Moderate Complexity: 1 Mod PT Treatments $Gait Training: 8-22 mins $Therapeutic Activity: 8-22 mins   PT G Codes:        Reinaldo Berber, PT, DPT Acute Rehab Services Pager: [phone]    Reinaldo Berber 07/19/2017, 6:01 PM

## 2017-07-19 NOTE — Progress Notes (Signed)
Orthopedic Trauma Service Progress Note   Patient ID: Dawn Foley MRN: [id number] DOB/AGE: 1985-10-21 31 y.o.  Subjective:  Appears to be doing well  block wore off about 7 PM last night No acute issues Mild discomfort to right hip.  Patient states that she has not consistently taken her Synthroid in about 2 years.  Unable to afford the doctor's visits.  States that she usually just gets a prescription when she was hospitalized for medication and then gets her medications through the $4 program at [facility]  Constitutional: Negative for chills and fever.  Respiratory: Negative for shortness of breath and wheezing.   Cardiovascular: Negative for chest pain and palpitations.  Gastrointestinal: Negative for nausea and vomiting.    Objective:   VITALS:   Vitals:   07/18/17 1700 07/18/17 1715 07/18/17 2023 07/19/17 0537  BP: (!) 81/47 (!) 89/48 (!) 114/58 (!) 110/57  Pulse: 90 76 (!) 110 97  Resp: 19 16 16 16   Temp: (!) 97 F (36.1 C) 97.6 F (36.4 C) 97.9 F (36.6 C) 97.9 F (36.6 C)  TempSrc:  Oral Oral Oral  SpO2: 100% 100% 100% 100%  Weight:      Height:        Estimated body mass index is 29.18 kg/m as calculated from the following:   Height as of this encounter: 5\' 4"  (1.626 m).   Weight as of this encounter: 77.1 kg (170 lb).   Intake/Output      11/08 0701 - 11/09 0700 11/09 0701 - 11/10 0700   P.O. 640    I.V. (mL/kg) 1250 (16.2)    Other 180    IV Piggyback 50    Total Intake(mL/kg) 2120 (27.5)    Urine (mL/kg/hr) 1000 2100 (3.8)   Blood 125    Total Output 1125 2100   Net +995 -2100        Stool Occurrence 1 x      LABS  Results for orders placed or performed during the hospital encounter of 07/18/17 (from the past 24 hour(s))  Urine culture     Status: Abnormal   Collection Time: 07/18/17  3:32 PM  Result Value Ref Range   Specimen Description URINE, CLEAN CATCH    Special Requests NONE    Culture (A)     50,000 COLONIES/mL LACTOBACILLUS SPECIES Standardized susceptibility testing for this organism is not available.    Report Status 07/19/2017 FINAL   CBC     Status: Abnormal   Collection Time: 07/18/17  8:44 PM  Result Value Ref Range   WBC 9.5 4.0 - 10.5 K/uL   RBC 3.96 3.87 - 5.11 MIL/uL   Hemoglobin 9.2 (L) 12.0 - 15.0 g/dL   HCT 30.9 (L) 36.0 - 46.0 %   MCV 78.0 78.0 - 100.0 fL   MCH 23.2 (L) 26.0 - 34.0 pg   MCHC 29.8 (L) 30.0 - 36.0 g/dL   RDW 19.2 (H) 11.5 - 15.5 %   Platelets 238 150 - 400 K/uL  Creatinine, serum     Status: None   Collection Time: 07/18/17  8:44 PM  Result Value Ref Range   Creatinine, Ser 0.81 0.44 - 1.00 mg/dL   GFR calc non Af Amer >60 >60 mL/min   GFR calc Af Amer >60 >60 mL/min  Basic metabolic panel     Status: Abnormal   Collection Time: 07/19/17  7:12 AM  Result Value Ref Range   Sodium 137 135 - 145  mmol/L   Potassium 3.6 3.5 - 5.1 mmol/L   Chloride 105 101 - 111 mmol/L   CO2 24 22 - 32 mmol/L   Glucose, Bld 112 (H) 65 - 99 mg/dL   BUN 6 6 - 20 mg/dL   Creatinine, Ser 0.66 0.44 - 1.00 mg/dL   Calcium 8.1 (L) 8.9 - 10.3 mg/dL   GFR calc non Af Amer >60 >60 mL/min   GFR calc Af Amer >60 >60 mL/min   Anion gap 8 5 - 15  CBC     Status: Abnormal   Collection Time: 07/19/17  7:12 AM  Result Value Ref Range   WBC 10.4 4.0 - 10.5 K/uL   RBC 3.32 (L) 3.87 - 5.11 MIL/uL   Hemoglobin 7.6 (L) 12.0 - 15.0 g/dL   HCT 26.0 (L) 36.0 - 46.0 %   MCV 78.3 78.0 - 100.0 fL   MCH 22.9 (L) 26.0 - 34.0 pg   MCHC 29.2 (L) 30.0 - 36.0 g/dL   RDW 19.0 (H) 11.5 - 15.5 %   Platelets 205 150 - 400 K/uL     PHYSICAL EXAM:   Gen: Awake and alert, no acute distress, sitting upright in bed Lungs: Lungs clear to auscultation bilaterally Cardiac: Regular rate and rhythm Abd: Soft, nontender,+ bowel sounds Ext:       Right lower extremity      Dressing is tattered  Patient removed proximal Ace wrap, cast padding is exposed.  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 Active Problems:   Alcohol use disorder, severe, dependence (HCC)   Major depressive disorder, recurrent episode with melancholic features ([place])   Hypothyroidism   Nicotine dependence   Hepatitis C   GERD (gastroesophageal reflux disease)   Anxiety   Anti-infectives (From admission, onward)   Start     Dose/Rate Route Frequency Ordered Stop   07/18/17 1730  ceFAZolin (ANCEF) IVPB 1 g/50 mL premix     1 g 100 mL/hr over 30 Minutes Intravenous Every 6 hours 07/18/17 1723 07/19/17 0540   07/18/17 0900  ceFAZolin (ANCEF) IVPB 2g/100 mL premix     2 g 200 mL/hr over 30 Minutes Intravenous On call to O.R. 07/18/17 9518 07/18/17 1059    .  POD/HD#: 85  31 year old female with complex right bicondylar tibial plateau nonunion   - right bicondylar tibial plateau fracture nonunion s/p  ORIF and RIA  Nonweightbearing right leg x 8 weeks  Unrestricted range of motion right knee  PT and OT evaluations  Dressing change tomorrow  Continue with bone foam or pillows under her ankle to help maintain knee extension While at rest  Ice and elevate  Hinged knee brace has been ordered    - Pain management:  Continue with current regimen  - ABL anemia/Hemodynamics  H&H has drifted down   CBC in the morning  Patient may require transfusion   - Medical issues   Home medications  Alcohol withdrawal protocol    TSH is markedly elevated   Follow-up free T3 and free  T4 labs in the morning     Elevation likely due to the fact the patient is  inconsistent with her medications   Patient really needs to get insurance coverage so she can follow up on this   May need to see if she can be referred to the health and wellness center   Nicotine dependence   Encourage smoking cessation   Increases her risk for infection as well as continued nonunion   No nicotine supplements   - DVT/PE prophylaxis:  Lovenox - ID:   Perioperative antibiotics  - Metabolic Bone Disease:  Vitamin D insufficiency   Supplement   Also add calcium     Vitamin C - Activity:  Nonweightbearing right leg  - FEN/GI prophylaxis/Foley/Lines:  Regular diet   - Impediments to fracture healing:  Many - Dispo:  Continue with inpatient care  Possible discharge home tomorrow    Jari Pigg, PA-C [facility] [phone] Levi Aland (C) 07/19/2017, 2:06 PM

## 2017-07-19 NOTE — Progress Notes (Signed)
Orthopedic Tech Progress Note Patient Details:  Dawn Foley 1985/12/01 [id number]  Patient ID: [name], female   DOB: [birth year], 31 y.o.   MRN: [id number]   [place] 07/19/2017, 10:02 AMCalled [facility] for right hinged knee brace.

## 2017-07-19 NOTE — Care Management Note (Signed)
Case Management Note  Patient Details  Name: Dawn Foley MRN: [id number] Date of Birth: 1986/05/12  Subjective/Objective:                 Spoke with patient at the bedside. Patient states she lives with her boyfriend in his house and her two kids. She states she has a BSC, electric WC, with ramps into the home and a modified bathroom. She states she wants to DC to a SNF bc she lays around the house all day, is bored, and spends a lot of time on facebook. Patient was rolling herself in a lightweight WC around nursing unit earlier today. Explained that she seemed too functional to DC to a SNF but I would look into charity [facility] through [facility] as another option. Patient was very grateful for the offer. Referral placed to [facility] to begin assessing her qualifications for their charity program. CM will continue to follow.    Action/Plan:   Expected Discharge Date:                  Expected Discharge Plan:  [place]  In-House Referral:     Discharge planning Services  CM Consult  Post Acute Care Choice:    Choice offered to:     DME Arranged:    DME Agency:     HH Arranged:    [place] Agency:     Status of Service:  In process, will continue to follow  If discussed at Long Length of Stay Meetings, dates discussed:    Additional Comments:  Carles Collet, RN 07/19/2017, 11:38 AM

## 2017-07-19 NOTE — Op Note (Signed)
NAME:  Dawn Foley, Dawn Foley                   ACCOUNT NO.:  MEDICAL RECORD NO.:  [id number]  LOCATION:                                 FACILITY:  PHYSICIAN:  Astrid Divine. Marcelino Scot, M.D. DATE OF BIRTH:  01/05/1986  DATE OF PROCEDURE:  07/18/2017 DATE OF DISCHARGE:                              OPERATIVE REPORT   PREOPERATIVE DIAGNOSES: 1. Right tibia nonunion. 2. Right bicondylar tibial plateau malunion.  POSTOPERATIVE DIAGNOSES: 1. Right tibia nonunion. 2. Right bicondylar tibial plateau malunion. 3. Avulsed lateral meniscus, anterior horn and mid body.  PROCEDURES: 1. Repair of nonunion right tibia with reamed intramedullary     aspiration autograft. 2. Open reduction and internal fixation of bicondylar tibial plateau     fracture. 3. Arthrotomy with repair of lateral meniscus with vertical mattress     sutures. 4. Stress fluoroscopy for assessment of ligamentous stability after     bone repair.  SURGEON:  Astrid Divine. Marcelino Scot, MD.  ASSISTANTS: 1. Ainsley Spinner, PA-C. 2. PA student.  ANESTHESIA:  General.  COMPLICATIONS:  None.  TOURNIQUET:  None.  ESTIMATED BLOOD LOSS:  125 mL.  DISPOSITION:  PACU.  CONDITION:  Stable.  Dawn Foley is a complex 31 year old patient who sustained a severe bicondylar and shaft fracture at an outside facility with associated compartment syndrome.  She also had alcohol dependence and underwent a series of procedures and had prolonged wound difficulties with regard to healing and resolution of infection.  She also has hepatitis and was hospitalized for treatment of her alcoholism.  She then subsequently underwent removal of her fixator most recently by myself about a month ago and was readmitted to an outside facility with pain.  CT scan shows malunion of the tibial plateau segments, which were pushed down into the metaphysis as well as a large bone defect nonunion within the tibia.  I discussed with her the risks and benefits of surgical  correction including the need for large scale autografting with reamed intramedullary aspiration of her right femur.  These risks included femur fracture, nerve injury, vessel injury, DVT, persistent nonunion, persistent malunion, arthritis, loss of motion, need for further surgery including total knee arthroplasty, and others.  We specifically discussed that this was a salvage-type procedure and then anatomic reduction of the articular surface would not be possible.  She acknowledged these risks and strongly wished to proceed, as did her mother who was present for these discussions and consent.  BRIEF SUMMARY OF PROCEDURE:  The patient was taken to the operating room after administration of 2 g of Ancef.  General anesthesia was induced. Right lower extremity was prepped and draped in usual sterile fashion, enabling access to the hip as well as the tibia.  After a time-out and standard sterile prep and drape, a curvilinear incision was made to the tibial plateau.  Arthrotomy was made proximal to the joint line and then the coronary ligament incised along its insertion on the tibia and reflected proximally.  It revealed the complete absence of a lateral meniscus, which had been torn entirely from the anterior horn all the way around the midbody and to the more anterior portion of the posterior horn,  leaving intact just a small rim posteriorly.  Fortunately, this was at the edge of the meniscus.  I was able to extricate it from the malunited tibial plateau to gain access into the malunion and nonunion site.  I identified the exit of the old fracture laterally and took a 0.5-inch osteotome and performed an osteotomy of the tibia.  I then booked open this osteotomy site to gain better control of the meniscus and to search for the malunited segment of joint that had been pushed down into the metaphysis.  After the osteotomy, I mobilized the meniscus cleaning the outside rim and placed a  series of 8 vertical mattress sutures through the retinaculum into the edge of the meniscus going from anterior to posterior and then tied these going from posterior to anterior with excellent apposition and structural integrity.  The defect posterolaterally was scraped out and mobilized to create a space.  The metaphysis was then closely evaluated using the CT scan as a roadmap to find a large piece of subchondral bone that was malunited within by gently scraping the surface along my osteotomy.  I was eventually able to find some dense subchondral bone, although I could not identify the articular surface.  As I teased this area, I was ultimately able to see some chondral layer and insert the Freer and then the Cobb over the top of this to mobilize a large segment, which measured 3.5 x 2 cm.  The malunion was then broken up and this segment of bone transported to the level of the bicondylar plateau inserting it in the posterolateral plateau with proper orientation.  I used a bone tamp to elevate it and then fine tune this under fluoro placing a K-wire.  I then used a [employer] clamp to compress across the articular surface and then placed the plate laterally and then 2 standard screws.  Additional standard screw was placed within the shaft.  I then brought the knee into full extension and checked the alignment with the fluoro machine to get the largest possible view and make sure that the alignment was appropriate at the plateau.  Once this was complete, I continued with instrumentation using again standard screws within the shaft and then at the extremes proximally and distally and then 2 lock screws adjacent to those.  Proximally, I placed lock screws using the multi-directional as well as fixed angle.  I was careful to check AP and lateral images throughout.  Assistant was necessary to control the leg and assist with retraction for instrumentation.  I then turned my attention to the  nonunion using an access area in the anterolateral tibia and fine tuned this with a curette and osteotome such that a 5-mL syringe could fit into the center of the defect.  I then brought in the C-arm, identified the correct starting point for the reamed intramedullary aspirator, starting this on the tip of the greater trochanter, checking under AP and lateral views with fluoro using the opening reamer with the soft tissue protector and then selecting a 14.5- mm reamed intramedullary aspirator.  This was advanced carefully with C- arm assistance and once it was down the distal femur, the graft was removed and brought to the back table where we had about 30 mL of outstanding graft and additional 20 mL or so of more fatty-type intramedullary graft.  The high quality graft was combined with 25 mL of cancellous chips and then this was packed into the metaphyseal defect using the 5-mL syringe  as a conduit and tamping this into place, we obtained outstanding fit and fill of this cavity to repair the nonunion. The trapdoor was oversewn with #1 PDS using #1 for the arthrotomy and then 2-0 PDS and 2-0 nylon.  Standard layered closure was performed proximally at the graft harvest site.  Ainsley Spinner, [facility], as well as a PA student again assisted me throughout and assistance was necessary to bring the hip into adduction and flexion during graft harvest and stabilize the leg.  Following these repairs, I brought in the C-arm and obtained final images consisting of AP and lateral as well as a stress view and slight flexion of 30 degrees to gauge the amount of instability, which was minimal and consistent with a stable knee.  Outstanding clinical alignment.  Sterile gently compressive dressing was applied after full closure.  The patient was taken to PACU in stable condition.  PROGNOSIS:  Dawn Foley will be nonweightbearing with unrestricted range of motion of the knee.  She is at risk for complications  given her social instability and alcoholism with high potential for noncompliance, subsequent falls, and other injuries.  Furthermore, hepatitis could increase risk of complications including bleeding.  A 2-day stay in the hospital is anticipated at this point.  She will be on formal DVT prophylaxis.  We are hopeful that in spite of her risk that she will go on to a reasonable result given the restoration of alignment, repair of her meniscus, presence of ligamentous stability, and a dramatic improvement in her articular reduction.     Astrid Divine. Marcelino Scot, M.D.     MHH/MEDQ  D:  07/18/2017  T:  07/18/2017  Job:  [id number]

## 2017-07-19 NOTE — Evaluation (Signed)
Occupational Therapy Evaluation Patient Details Name: Dawn Foley MRN: [id number] DOB: [birth year] Today's Date: 07/19/2017    History of Present Illness R tibia nonunion with repair. Pt. had repair of meniscus. 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 PMH: alcoholism   Clinical Impression   Pt. Has decreased I and safety with ADLs and mobility. Pt. Is requiring one person assist with transfers. Pt. Does not have 24 hour care and wants to DC to SNF for rehab. Pt. Is also interested in behavior health rehab to assist with treatment of her alcohlism.    Follow Up Recommendations  Home health OT;SNF    Equipment Recommendations       Recommendations for Other Services       Precautions / Restrictions Precautions Precautions: Fall Restrictions Weight Bearing Restrictions: Yes RLE Weight Bearing: Non weight bearing      Mobility Bed Mobility Overal bed mobility: Needs Assistance Bed Mobility: Supine to Sit     Supine to sit: Mod assist        Transfers                 General transfer comment: Pt. is Min a with scoot pivot.    Balance                                           ADL either performed or assessed with clinical judgement   ADL Overall ADL's : Needs assistance/impaired     Grooming: Wash/dry hands;Wash/dry face;Set up;Sitting   Upper Body Bathing: Set up;Sitting   Lower Body Bathing: Minimal assistance;Moderate assistance;Sit to/from stand   Upper Body Dressing : Supervision/safety;Set up;Sitting   Lower Body Dressing: Moderate assistance;Sit to/from stand   Toilet Transfer: Moderate assistance   Toileting- Clothing Manipulation and Hygiene: Moderate assistance         General ADL Comments: Pt. was educated to      Vision Baseline Vision/History: No visual deficits Patient Visual Report: No change from baseline       Perception     Praxis      Pertinent Vitals/Pain Pain  Assessment: 0-10 Pain Score: 5  Pain Location: R LE Pain Descriptors / Indicators: Constant;Shooting Pain Intervention(s): Limited activity within patient's tolerance;Monitored during session;Premedicated before session;Patient requesting pain meds-RN notified     Hand Dominance     Extremity/Trunk Assessment Upper Extremity Assessment Upper Extremity Assessment: Overall WFL for tasks assessed           Communication Communication Communication: No difficulties   Cognition Arousal/Alertness: Awake/alert Behavior During Therapy: WFL for tasks assessed/performed Overall Cognitive Status: Within Functional Limits for tasks assessed                                     General Comments       Exercises     Shoulder Instructions      Home Living Family/patient expects to be discharged to:: Private residence Living Arrangements: Children;Parent Available Help at Discharge: Available PRN/intermittently                         Home Equipment: Bedside commode;Tub bench;Wheelchair - manual;Wheelchair - power   Additional Comments: Pt. wants to go to SNF for rehab.      Prior Functioning/Environment Level of  Independence: Independent with assistive device(s)        Comments: Pt. required assist with bathing and dressing since accident.Pt. has been w/c level for 4 months        OT Problem List:        OT Treatment/Interventions: Self-care/ADL training;DME and/or AE instruction;Therapeutic activities    OT Goals(Current goals can be found in the care plan section) Acute Rehab OT Goals Patient Stated Goal: Pt. wants to d/c to SNF for rehab. OT Goal Formulation: With patient Time For Goal Achievement: 08/02/17 Potential to Achieve Goals: Good  OT Frequency: Min 2X/week   Barriers to D/C: Decreased caregiver support          Co-evaluation              AM-PAC PT "6 Clicks" Daily Activity     Outcome Measure Help from another person  eating meals?: None Help from another person taking care of personal grooming?: A Little Help from another person toileting, which includes using toliet, bedpan, or urinal?: A Lot Help from another person bathing (including washing, rinsing, drying)?: A Lot Help from another person to put on and taking off regular upper body clothing?: A Little Help from another person to put on and taking off regular lower body clothing?: A Lot 6 Click Score: 16   End of Session Equipment Utilized During Treatment: Gait belt Nurse Communication: Mobility status;Patient requests pain meds  Activity Tolerance: Patient tolerated treatment well Patient left: in chair  OT Visit Diagnosis: Pain                Time: 8264-1583 OT Time Calculation (min): 46 min Charges:  OT General Charges $OT Visit: 1 Visit OT Evaluation $OT Eval Low Complexity: 1 Low OT Treatments $Self Care/Home Management : 23-37 mins G-Codes:     6 clicks  Dietra Stokely 07/19/2017, 9:43 AM

## 2017-07-20 LAB — CBC
HEMATOCRIT: 22.6 % — AB (ref 36.0–46.0)
HEMOGLOBIN: 6.7 g/dL — AB (ref 12.0–15.0)
MCH: 22.9 pg — ABNORMAL LOW (ref 26.0–34.0)
MCHC: 29.6 g/dL — ABNORMAL LOW (ref 30.0–36.0)
MCV: 77.4 fL — ABNORMAL LOW (ref 78.0–100.0)
Platelets: 189 10*3/uL (ref 150–400)
RBC: 2.92 MIL/uL — AB (ref 3.87–5.11)
RDW: 19.8 % — ABNORMAL HIGH (ref 11.5–15.5)
WBC: 6.3 10*3/uL (ref 4.0–10.5)

## 2017-07-20 LAB — BASIC METABOLIC PANEL
ANION GAP: 3 — AB (ref 5–15)
BUN: 6 mg/dL (ref 6–20)
CALCIUM: 8.1 mg/dL — AB (ref 8.9–10.3)
CO2: 27 mmol/L (ref 22–32)
Chloride: 110 mmol/L (ref 101–111)
Creatinine, Ser: 0.47 mg/dL (ref 0.44–1.00)
Glucose, Bld: 106 mg/dL — ABNORMAL HIGH (ref 65–99)
POTASSIUM: 3.7 mmol/L (ref 3.5–5.1)
SODIUM: 140 mmol/L (ref 135–145)

## 2017-07-20 LAB — T4, FREE: FREE T4: 0.87 ng/dL (ref 0.61–1.12)

## 2017-07-20 LAB — PREPARE RBC (CROSSMATCH)

## 2017-07-20 LAB — ABO/RH: ABO/RH(D): O POS

## 2017-07-20 MED ORDER — SODIUM CHLORIDE 0.9 % IV SOLN
Freq: Once | INTRAVENOUS | Status: AC
  Administered 2017-07-20: 08:00:00 via INTRAVENOUS

## 2017-07-20 MED ORDER — DIPHENHYDRAMINE HCL 25 MG PO CAPS
25.0000 mg | ORAL_CAPSULE | Freq: Four times a day (QID) | ORAL | Status: DC | PRN
  Administered 2017-07-21: 25 mg via ORAL
  Filled 2017-07-20: qty 1

## 2017-07-20 NOTE — Progress Notes (Signed)
PT Cancellation Note  Patient Details Name: Dawn Foley MRN: [id number] DOB: Aug 27, 1986   Cancelled Treatment:    Reason Eval/Treat Not Completed: (P) Patient declined, no reason specified(Pt stated too tired and in too much pain. Offered maximum encouragement and still denied.)  Janna Arch, SPTA  Janna Arch 07/20/2017, 4:15 PM

## 2017-07-20 NOTE — Progress Notes (Signed)
OT Cancellation Note  Patient Details Name: KEYONNA COMUNALE MRN: [id number] DOB: [birth year]   Cancelled Treatment:    Reason Eval/Treat Not Completed: Medical issues which prohibited therapy. Note Hemogloblin with critically low value 6.7.  Will check back as pt. Able to participate in skilled OT.    Janice Coffin, COTA/L 07/20/2017, 10:16 AM

## 2017-07-20 NOTE — Progress Notes (Signed)
CRITICAL VALUE ALERT  Critical Value:  Hgb 6.7  Date & Time Notied:  07/20/17  Provider Notified: Dr. Altamese Moreland (Paged)   Orders Received/Actions taken: Pending  Called the number that was after 5 pm and call answered by Joelene Millin from Ortho. She said she will notify the Doctor.

## 2017-07-20 NOTE — Progress Notes (Signed)
Orthopedic Trauma Service Progress Note   Patient ID: ARINA TORRY MRN: [id number] DOB/AGE: Nov 24, 1985 31 y.o.  Subjective:  Doing well Pain appears to be controlled No additional complaints  H&H is 6.7 and 22.6   Review of Systems  Constitutional: Negative for chills and fever.  Respiratory: Negative for shortness of breath and wheezing.   Cardiovascular: Negative for chest pain and palpitations.  Gastrointestinal: Negative for nausea and vomiting.  Neurological: Negative for tingling and sensory change.    Objective:   VITALS:   Vitals:   07/19/17 0537 07/19/17 1446 07/19/17 2126 07/20/17 0734  BP: (!) 110/57 125/64 124/65 120/80  Pulse: 97 (!) 104 (!) 104 (!) 123  Resp: 16 18    Temp: 97.9 F (36.6 C) 98.2 F (36.8 C) 98.1 F (36.7 C) 98.4 F (36.9 C)  TempSrc: Oral Oral Oral Oral  SpO2: 100% 100% 100% 98%  Weight:      Height:        Estimated body mass index is 29.18 kg/m as calculated from the following:   Height as of this encounter: 5\' 4"  (1.626 m).   Weight as of this encounter: 77.1 kg (170 lb).   Intake/Output      11/09 0701 - 11/10 0700 11/10 0701 - 11/11 0700   P.O. 500    I.V. (mL/kg)     Other     IV Piggyback     Total Intake(mL/kg) 500 (6.5)    Urine (mL/kg/hr) 2100 (1.1)    Blood     Total Output 2100    Net -1600         Urine Occurrence 2 x      LABS  Results for orders placed or performed during the hospital encounter of 07/18/17 (from the past 24 hour(s))  Basic metabolic panel     Status: Abnormal   Collection Time: 07/20/17  4:32 AM  Result Value Ref Range   Sodium 140 135 - 145 mmol/L   Potassium 3.7 3.5 - 5.1 mmol/L   Chloride 110 101 - 111 mmol/L   CO2 27 22 - 32 mmol/L   Glucose, Bld 106 (H) 65 - 99 mg/dL   BUN 6 6 - 20 mg/dL   Creatinine, Ser 0.47 0.44 - 1.00 mg/dL   Calcium 8.1 (L) 8.9 - 10.3 mg/dL   GFR calc non Af Amer >60 >60 mL/min   GFR calc Af Amer >60 >60 mL/min   Anion gap  3 (L) 5 - 15  CBC     Status: Abnormal   Collection Time: 07/20/17  4:32 AM  Result Value Ref Range   WBC 6.3 4.0 - 10.5 K/uL   RBC 2.92 (L) 3.87 - 5.11 MIL/uL   Hemoglobin 6.7 (LL) 12.0 - 15.0 g/dL   HCT 22.6 (L) 36.0 - 46.0 %   MCV 77.4 (L) 78.0 - 100.0 fL   MCH 22.9 (L) 26.0 - 34.0 pg   MCHC 29.6 (L) 30.0 - 36.0 g/dL   RDW 19.8 (H) 11.5 - 15.5 %   Platelets 189 150 - 400 K/uL  T4, free     Status: None   Collection Time: 07/20/17  4:32 AM  Result Value Ref Range   Free T4 0.87 0.61 - 1.12 ng/dL  ABO/Rh     Status: None (Preliminary result)   Collection Time: 07/20/17  5:56 AM  Result Value Ref Range   ABO/RH(D) O POS   Prepare RBC     Status: None   Collection  Time: 07/20/17  5:56 AM  Result Value Ref Range   Order Confirmation ORDER PROCESSED BY BLOOD BANK   Type and screen [place]     Status: None (Preliminary result)   Collection Time: 07/20/17  6:00 AM  Result Value Ref Range   ABO/RH(D) O POS    Antibody Screen NEG    Sample Expiration 07/23/2017    Unit Number [id number]    Blood Component Type RBC LR PHER1    Unit division 00    Status of Unit ALLOCATED    Transfusion Status OK TO TRANSFUSE    Crossmatch Result Compatible    Unit Number [id number]    Blood Component Type RED CELLS,LR    Unit division 00    Status of Unit ALLOCATED    Transfusion Status OK TO TRANSFUSE    Crossmatch Result Compatible      PHYSICAL EXAM:   Gen: Awake and alert, no acute distress, sitting upright in bed Lungs: breathing is unlabored Cardiac: Regular rate and rhythm Abd: Soft, nontender,+ bowel sounds Ext:       Right lower extremity                 Dressing is stable             DPN, SPN, TN sensory functions grossly intact                     EHL, FHL, anterior tibialis, posterior tibialis, peroneals and gastrocsoleus complex motor functions are grossly intact             Extremity is warm             + DP pulse             Lower leg is  soft             No pain with passive stretching             Right hip dressing is stable   Assessment/Plan: 2 Days Post-Op   Principal Problem:   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 Active Problems:   Alcohol use disorder, severe, dependence (HCC)   Major depressive disorder, recurrent episode with melancholic features (HCC)   Hypothyroidism   Nicotine dependence   Hepatitis C   GERD (gastroesophageal reflux disease)   Anxiety   Anti-infectives (From admission, onward)   Start     Dose/Rate Route Frequency Ordered Stop   07/18/17 1730  ceFAZolin (ANCEF) IVPB 1 g/50 mL premix     1 g 100 mL/hr over 30 Minutes Intravenous Every 6 hours 07/18/17 1723 07/19/17 0540   07/18/17 0900  ceFAZolin (ANCEF) IVPB 2g/100 mL premix     2 g 200 mL/hr over 30 Minutes Intravenous On call to O.R. 07/18/17 0850 07/18/17 1059    .  POD/HD#: 59  31 year old female with complex right bicondylar tibial plateau nonunion     - right bicondylar tibial plateau fracture nonunion s/p  ORIF and RIA             Nonweightbearing right leg x 8 weeks             Unrestricted range of motion right knee             PT and OT    PT- please teach HEP for R knee ROM- AROM, PROM. Prone exercises as well. No ROM restrictions.  Quad sets, SLR, LAQ, SAQ, heel slides, stretching,  prone flexion and extension   No pillows under bend of knee when at rest, ok to place under heel to help work on extension or use zero knee bone foam              Dressing change tomorrow              Ice and elevate             Hinged knee brace when mobilizing                - Pain management:             Continue with current regimen   - ABL anemia/Hemodynamics            Transfused 2 units of packed red blood cells today  CBC in the morning              - Medical issues              Home medications             Alcohol withdrawal protocol                TSH is markedly elevated                         Free  T4 is normal                                 Consult placed for care management assistance to help arrange follow-up at community health and wellness center either here in [place] or similar facility near [employer]               Nicotine dependence                         Encourage smoking cessation                         Increases her risk for infection as well as continued nonunion                         No nicotine supplements           - DVT/PE prophylaxis:             Lovenox - ID:              Perioperative antibiotics   - Metabolic Bone Disease:             Vitamin D insufficiency                         Supplement                         Also add calcium                                              Vitamin C - Activity:             Nonweightbearing right leg   - FEN/GI prophylaxis/Foley/Lines:             Regular diet              -  Impediments to fracture healing:             Many - Dispo:             Continue with inpatient care             Possible discharge home tomorrow  Jari Pigg, PA-C [facility] [phone] Levi Aland (C) 07/20/2017, 9:57 AM

## 2017-07-21 LAB — TYPE AND SCREEN
ABO/RH(D): O POS
ANTIBODY SCREEN: NEGATIVE
UNIT DIVISION: 0
UNIT DIVISION: 0

## 2017-07-21 LAB — BASIC METABOLIC PANEL
ANION GAP: 5 (ref 5–15)
BUN: 6 mg/dL (ref 6–20)
CO2: 26 mmol/L (ref 22–32)
Calcium: 8.4 mg/dL — ABNORMAL LOW (ref 8.9–10.3)
Chloride: 105 mmol/L (ref 101–111)
Creatinine, Ser: 0.49 mg/dL (ref 0.44–1.00)
GFR calc non Af Amer: >60 mL/min (ref >60)
GLUCOSE: 95 mg/dL (ref 65–99)
POTASSIUM: 3.3 mmol/L — AB (ref 3.5–5.1)
Sodium: 136 mmol/L (ref 135–145)

## 2017-07-21 LAB — CBC
HEMATOCRIT: 28.1 % — AB (ref 36.0–46.0)
HEMOGLOBIN: 8.6 g/dL — AB (ref 12.0–15.0)
MCH: 23.6 pg — ABNORMAL LOW (ref 26.0–34.0)
MCHC: 30.6 g/dL (ref 30.0–36.0)
MCV: 77 fL — ABNORMAL LOW (ref 78.0–100.0)
Platelets: 181 10*3/uL (ref 150–400)
RBC: 3.65 MIL/uL — AB (ref 3.87–5.11)
RDW: 18.8 % — ABNORMAL HIGH (ref 11.5–15.5)
WBC: 5.8 10*3/uL (ref 4.0–10.5)

## 2017-07-21 LAB — BPAM RBC
BLOOD PRODUCT EXPIRATION DATE: 201812022359
BLOOD PRODUCT EXPIRATION DATE: 201812052359
ISSUE DATE / TIME: 201811101636
ISSUE DATE / TIME: 201811101032
UNIT TYPE AND RH: 5100
UNIT TYPE AND RH: 5100

## 2017-07-21 MED ORDER — VITAMIN D3 125 MCG (5000 UT) PO TABS: 5000.0000 [IU] | ORAL_TABLET | Freq: Every day | ORAL | 3 refills | Status: DC

## 2017-07-21 MED ORDER — CYCLOBENZAPRINE HCL 10 MG PO TABS: 10.0000 mg | ORAL_TABLET | Freq: Three times a day (TID) | ORAL | 1 refills | Status: DC | PRN

## 2017-07-21 MED ORDER — DOCUSATE SODIUM 100 MG PO CAPS: 100.0000 mg | ORAL_CAPSULE | Freq: Two times a day (BID) | ORAL | 0 refills | Status: DC

## 2017-07-21 MED ORDER — CALCIUM CITRATE 950 (200 CA) MG PO TABS: 200.0000 mg | ORAL_TABLET | Freq: Two times a day (BID) | ORAL | 2 refills | Status: DC

## 2017-07-21 MED ORDER — TRAMADOL HCL 50 MG PO TABS: 50.0000 mg | ORAL_TABLET | Freq: Two times a day (BID) | ORAL | 0 refills | Status: DC | PRN

## 2017-07-21 MED ORDER — ASPIRIN EC 325 MG PO TBEC: 325.0000 mg | DELAYED_RELEASE_TABLET | Freq: Two times a day (BID) | ORAL | 0 refills | Status: AC

## 2017-07-21 MED ORDER — HYDROCODONE-ACETAMINOPHEN 10-325 MG PO TABS: 1.0000 | ORAL_TABLET | Freq: Four times a day (QID) | ORAL | 0 refills | Status: DC | PRN

## 2017-07-21 MED ORDER — ASCORBIC ACID 500 MG PO TABS: 500.0000 mg | ORAL_TABLET | Freq: Every day | ORAL | 1 refills | Status: DC

## 2017-07-21 MED ORDER — GABAPENTIN 300 MG PO CAPS: 300.0000 mg | ORAL_CAPSULE | Freq: Three times a day (TID) | ORAL | 0 refills | Status: DC

## 2017-07-21 NOTE — Discharge Instructions (Addendum)
[facility]opaedic [facility]uma Service Discharge Instructions   General Discharge Instructions  WEIGHT BEARING STATUS: Nonweightbearing right leg  RANGE OF MOTION/ACTIVITY: Active range of motion right knee and ankle.   use hinged knee brace when mobilizing but maintain nonweightbearing on right leg   Activity as tolerated while maintaining Weightbearing restrictions  Home Exercise Program for R knee ROM- AROM, PROM. Prone exercises as well. No ROM restrictions.  Quad sets, SLR, LAQ, SAQ, heel slides, stretching, prone flexion and extension. You should have exercise sheet that was reviewed with you by therapy   No pillows under bend of knee when at rest, ok to place under heel to help work on extension or use the blue bone foam to elevate you leg and keep knee straight at rest    Wound Care: Daily wound care starting on 07/22/2017.  Please see below   Discharge Wound Care Instructions  Do NOT apply any ointments, solutions or lotions to pin sites or surgical wounds.  These prevent needed drainage and even though solutions like hydrogen peroxide kill bacteria, they also damage cells lining the pin sites that help fight infection.  Applying lotions or ointments can keep the wounds moist and can cause them to breakdown and open up as well. This can increase the risk for infection. When in doubt call the office.  Surgical incisions should be dressed daily.  If any drainage is noted, use one layer of adaptic, then gauze, Kerlix, and an ace wrap.  Once the incision is completely dry and without drainage, it may be left open to air out.  Showering may begin 36-48 hours later.  Cleaning gently with soap and water.  Traumatic wounds should be dressed daily as well.    One layer of adaptic, gauze, Kerlix, then ace wrap.  The adaptic can be discontinued once the draining has ceased    If you have a wet to dry dressing: wet the gauze with saline the squeeze as much saline out so the gauze is moist (not  soaking wet), place moistened gauze over wound, then place a dry gauze over the moist one, followed by Kerlix wrap, then ace wrap.  DVT/PE prophylaxis:  Aspirin 325 mg every 12 hours x 4 weeks   Diet: as you were eating previously.  Can use over the counter stool softeners and bowel preparations, such as Miralax, to help with bowel movements.  Narcotics can be constipating.  Be sure to drink plenty of fluids  PAIN MEDICATION USE AND EXPECTATIONS  You have likely been given narcotic medications to help control your pain.  After a traumatic event that results in an fracture (broken bone) with or without surgery, it is ok to use narcotic pain medications to help control one's pain.  We understand that everyone responds to pain differently and each individual patient will be evaluated on a regular basis for the continued need for narcotic medications. Ideally, narcotic medication use should last no more than 6-8 weeks (coinciding with fracture healing).   As a patient it is your responsibility as well to monitor narcotic medication use and report the amount and frequency you use these medications when you come to your office visit.   We would also advise that if you are using narcotic medications, you should take a dose prior to therapy to maximize you participation.  IF YOU ARE ON NARCOTIC MEDICATIONS IT IS NOT PERMISSIBLE TO OPERATE A MOTOR VEHICLE (MOTORCYCLE/CAR/TRUCK/MOPED) OR HEAVY MACHINERY DO NOT MIX NARCOTICS WITH OTHER CNS (CENTRAL NERVOUS SYSTEM) DEPRESSANTS  SUCH AS ALCOHOL   STOP SMOKING OR USING NICOTINE PRODUCTS!!!!  As discussed nicotine severely impairs your body's ability to heal surgical and traumatic wounds but also impairs bone healing.  Wounds and bone heal by forming microscopic blood vessels (angiogenesis) and nicotine is a vasoconstrictor (essentially, shrinks blood vessels).  Therefore, if vasoconstriction occurs to these microscopic blood vessels they essentially disappear and  are unable to deliver necessary nutrients to the healing tissue.  This is one modifiable factor that you can do to dramatically increase your chances of healing your injury.    (This means no smoking, no nicotine gum, patches, etc)  DO NOT USE NONSTEROIDAL ANTI-INFLAMMATORY DRUGS (NSAID'S)  Using products such as Advil (ibuprofen), Aleve (naproxen), Motrin (ibuprofen) for additional pain control during fracture healing can delay and/or prevent the healing response.  If you would like to take over the counter (OTC) medication, Tylenol (acetaminophen) is ok.  However, some narcotic medications that are given for pain control contain acetaminophen as well. Therefore, you should not exceed more than 4000 mg of tylenol in a day if you do not have liver disease.  Also note that there are may OTC medicines, such as cold medicines and allergy medicines that my contain tylenol as well.  If you have any questions about medications and/or interactions please ask your doctor/PA or your pharmacist.      ICE AND ELEVATE INJURED/OPERATIVE EXTREMITY  Using ice and elevating the injured extremity above your heart can help with swelling and pain control.  Icing in a pulsatile fashion, such as 20 minutes on and 20 minutes off, can be followed.    Do not place ice directly on skin. Make sure there is a barrier between to skin and the ice pack.    Using frozen items such as frozen peas works well as the conform nicely to the are that needs to be iced.  USE AN ACE WRAP OR TED HOSE FOR SWELLING CONTROL  In addition to icing and elevation, Ace wraps or TED hose are used to help limit and resolve swelling.  It is recommended to use Ace wraps or TED hose until you are informed to stop.    When using Ace Wraps start the wrapping distally (farthest away from the body) and wrap proximally (closer to the body)   Example: If you had surgery on your leg or thing and you do not have a splint on, start the ace wrap at the toes and  work your way up to the thigh        If you had surgery on your upper extremity and do not have a splint on, start the ace wrap at your fingers and work your way up to the upper arm  [place]: [phone]

## 2017-07-21 NOTE — Progress Notes (Signed)
Occupational Therapy Treatment Patient Details Name: Dawn Foley MRN: [id number] DOB: [birth year] Today's Date: 07/21/2017    History of present illness R tibia nonunion with repair. Pt. had repair of meniscus. 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    OT comments  Upon arrival, pt supine in bed and awake. Pt demonstrating poor cognition and awareness. Pt donning underwear with Min A for standing balance. Pt with several LOB where she feel back on the bed or required physical A to maintain standing balance. Continue to recommend post-acute rehab; however, pt wanting to dc home. Pt will required 24 hour supervision/assistance at home due to poor safety awareness and fall risk. Will continue to follow acutely to facilitate safe dc.   Follow Up Recommendations  Home health OT;SNF;Supervision/Assistance - 24 hour    Equipment Recommendations  None recommended by OT    Recommendations for Other Services      Precautions / Restrictions Precautions Precautions: Fall Restrictions Weight Bearing Restrictions: Yes RLE Weight Bearing: Non weight bearing       Mobility Bed Mobility Overal bed mobility: Modified Independent Bed Mobility: Supine to Sit     Supine to sit: Modified independent (Device/Increase time)     General bed mobility comments: Increased time  Transfers Overall transfer level: Needs assistance Equipment used: Rolling walker (2 wheeled) Transfers: Sit to/from [facility] Sit to Stand: Min assist         General transfer comment: Min A for standing balance.    Balance Overall balance assessment: Needs assistance Sitting-balance support: Feet supported;No upper extremity supported Sitting balance-Leahy Scale: Good     Standing balance support: Bilateral upper extremity supported Standing balance-Leahy Scale: Poor Standing balance comment: Heavy reliance on BUE support in static and dynamic balance. Self  limited by anxiety of falling.                            ADL either performed or assessed with clinical judgement   ADL Overall ADL's : Needs assistance/impaired                     Lower Body Dressing: Sit to/from stand;Minimal assistance Lower Body Dressing Details (indicate cue type and reason): Min A for balance in standing. Pt with several LOB episodes while standing to don underwear. Issued reacher to increase safety and decrease risky reaching behavior in standing.            Tub/Shower Transfer Details (indicate cue type and reason): Pt reporting that her boyfriend has a tub bench she plans to use at home Functional mobility during ADLs: Minimal assistance;Rolling walker General ADL Comments: Provided education on LB dressing and safety. Pt very impulsive and with decreased safety awanress. Pt with difficulty word finding and flucuating emotions from frustration to crying to laughing.     Vision       Perception     Praxis      Cognition Arousal/Alertness: Awake/alert Behavior During Therapy: Impulsive;Anxious;Restless Overall Cognitive Status: No family/caregiver present to determine baseline cognitive functioning Area of Impairment: Attention;Memory;Following commands;Safety/judgement;Awareness;Problem solving                   Current Attention Level: Selective Memory: Decreased short-term memory Following Commands: Follows one step commands with increased time Safety/Judgement: Decreased awareness of safety Awareness: Emergent Problem Solving: Slow processing;Requires verbal cues General Comments: Pt highly impulsive and with poor awarness of limitations and safety.  Pt requiring increase time and cues. Feel this is close to pt baseline. Pt fluctuating between emotions throughout session.        Exercises     Shoulder Instructions       General Comments      Pertinent Vitals/ Pain       Pain Assessment: Faces Faces Pain  Scale: Hurts little more Pain Location: R LE Pain Descriptors / Indicators: Constant;Shooting Pain Intervention(s): Monitored during session;Limited activity within patient's tolerance;Repositioned  Home Living                                          Prior Functioning/Environment              Frequency  Min 2X/week        Progress Toward Goals  OT Goals(current goals can now be found in the care plan section)  Progress towards OT goals: Progressing toward goals  Acute Rehab OT Goals Patient Stated Goal: Pt wants to d/c to HHPT  OT Goal Formulation: With patient Time For Goal Achievement: 08/02/17 Potential to Achieve Goals: Good ADL Goals Pt Will Perform Lower Body Bathing: with supervision;with set-up;sit to/from stand Pt Will Perform Lower Body Dressing: with supervision;with set-up;sit to/from stand Pt Will Transfer to Toilet: with supervision;with set-up;ambulating;regular height toilet Pt Will Perform Toileting - Clothing Manipulation and hygiene: with set-up;with supervision;sit to/from stand Pt Will Perform Tub/Shower Transfer: with set-up;with supervision;tub bench;rolling walker;ambulating  Plan Discharge plan remains appropriate    Co-evaluation                 AM-PAC PT "6 Clicks" Daily Activity     Outcome Measure   Help from another person eating meals?: None Help from another person taking care of personal grooming?: A Little Help from another person toileting, which includes using toliet, bedpan, or urinal?: A Lot Help from another person bathing (including washing, rinsing, drying)?: A Lot Help from another person to put on and taking off regular upper body clothing?: A Little Help from another person to put on and taking off regular lower body clothing?: A Little 6 Click Score: 17    End of Session Equipment Utilized During Treatment: Gait belt;Rolling walker  OT Visit Diagnosis: Pain Pain - Right/Left: Right Pain -  part of body: Leg   Activity Tolerance Patient tolerated treatment well   Patient Left in bed;with call bell/phone within reach;with bed alarm set   Nurse Communication Mobility status;Patient requests pain meds        Time: 0927-0949 OT Time Calculation (min): 22 min  Charges: OT General Charges $OT Visit: 1 Visit OT Treatments $Self Care/Home Management : 8-22 mins  [place], OTR/L Acute Rehab Pager: [phone] Office: [place] 07/21/2017, 12:37 PM

## 2017-07-21 NOTE — Progress Notes (Addendum)
Pt in room, IV discontinued, received discharge instructions. Mother called this nurse, stated she would call when she arrived so that staff could bring pt down to front of hospital to help into car. Pt notified of this and agreed, asked for assistance getting her things together. Pt exited room in wheelchair wearing paper scrubs and went to front of unit to leave without things, stated she was going downstairs to see her mom and her babies because she didn't want them to come upstairs. [facility] desk staff asked her not to leave the unit, pt went to the elevator. This nurse notified but did not reach front desk in time. Tech Camera operator went down to front of building, did not see Pt. Pt returned to the unit several minutes later, stated she could not find them, returned to room. This nurse educated pt on not leaving unit without alerting staff or being accompanied by staff.  PA notified

## 2017-07-21 NOTE — Progress Notes (Signed)
Orthopedic Trauma Service Progress Note   Patient ID: Dawn Foley MRN: [id number] DOB/AGE: [birth year] 30 y.o.  Subjective:  Doing fine Wants to go home States she is not getting pain meds when asked for  Tolerated transfusion yesterday  Looks like pt only received 1 unit of PRBCs despite order for 2   Possible mild transfusion reaction at end of first unit, itching noted   No residual symptoms    Review of Systems  Constitutional: Negative for chills and fever.  Respiratory: Negative for shortness of breath and wheezing.   Cardiovascular: Negative for chest pain and palpitations.  Gastrointestinal: Negative for nausea and vomiting.    Objective:   VITALS:   Vitals:   07/20/17 1654 07/20/17 1715 07/20/17 2103 07/21/17 0428  BP: 125/64 (!) 125/53 117/66 127/80  Pulse: (!) 113 (!) 115 (!) 112 (!) 105  Resp: 16  20 (!) 21  Temp: 99.6 F (37.6 C) 99.6 F (37.6 C) 99.1 F (37.3 C) 98.7 F (37.1 C)  TempSrc: Oral Oral Oral Axillary  SpO2:   98% 100%  Weight:      Height:        Estimated body mass index is 29.18 kg/m as calculated from the following:   Height as of this encounter: 5\' 4"  (1.626 m).   Weight as of this encounter: 77.1 kg (170 lb).   Intake/Output      11/10 0701 - 11/11 0700 11/11 0701 - 11/12 0700   P.O. 1320 480   Blood 303    Total Intake(mL/kg) 1623 (21.1) 480 (6.2)   Urine (mL/kg/hr)     Total Output     Net +1623 +480        Urine Occurrence 3 x 1 x   Stool Occurrence 1 x      LABS  Results for orders placed or performed during the hospital encounter of 07/18/17 (from the past 24 hour(s))  Basic metabolic panel     Status: Abnormal   Collection Time: 07/21/17  4:02 AM  Result Value Ref Range   Sodium 136 135 - 145 mmol/L   Potassium 3.3 (L) 3.5 - 5.1 mmol/L   Chloride 105 101 - 111 mmol/L   CO2 26 22 - 32 mmol/L   Glucose, Bld 95 65 - 99 mg/dL   BUN 6 6 - 20 mg/dL   Creatinine, Ser 0.49 0.44 - 1.00  mg/dL   Calcium 8.4 (L) 8.9 - 10.3 mg/dL   GFR calc non Af Amer >60 >60 mL/min   GFR calc Af Amer >60 >60 mL/min   Anion gap 5 5 - 15  CBC     Status: Abnormal   Collection Time: 07/21/17  4:02 AM  Result Value Ref Range   WBC 5.8 4.0 - 10.5 K/uL   RBC 3.65 (L) 3.87 - 5.11 MIL/uL   Hemoglobin 8.6 (L) 12.0 - 15.0 g/dL   HCT 28.1 (L) 36.0 - 46.0 %   MCV 77.0 (L) 78.0 - 100.0 fL   MCH 23.6 (L) 26.0 - 34.0 pg   MCHC 30.6 30.0 - 36.0 g/dL   RDW 18.8 (H) 11.5 - 15.5 %   Platelets 181 150 - 400 K/uL     PHYSICAL EXAM:   OFB:PZWCHEN around in WC, NAD Lungs: no wheezes, decreased at bases o/w clear Cardiac: regular  Abd: + BS, NTND Ext:       Right Lower Extremity   Dressing removed, all bunched up at ankle  Incision looks great to  lateral lower leg, medial poke incision stable    No drainage   No signs of infection     No erythema  Distal motor and sensory functions intact  Ext warm  + DP pulse  No DCT  Compartments are soft   ROM ~ 5-85 degrees w/o discomfort  Moderate swelling but expected   Assessment/Plan: 3 Days Post-Op   Principal Problem:   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 Active Problems:   Alcohol use disorder, severe, dependence (HCC)   Major depressive disorder, recurrent episode with melancholic features (HCC)   Hypothyroidism   Nicotine dependence   Hepatitis C   GERD (gastroesophageal reflux disease)   Anxiety   Anti-infectives (From admission, onward)   Start     Dose/Rate Route Frequency Ordered Stop   07/18/17 1730  ceFAZolin (ANCEF) IVPB 1 g/50 mL premix     1 g 100 mL/hr over 30 Minutes Intravenous Every 6 hours 07/18/17 1723 07/19/17 0540   07/18/17 0900  ceFAZolin (ANCEF) IVPB 2g/100 mL premix     2 g 200 mL/hr over 30 Minutes Intravenous On call to O.R. 07/18/17 3825 07/18/17 1059    .  POD/HD#: 33  31 year old female with complex right bicondylar tibial plateau nonunion     - right bicondylar tibial plateau  fracture nonunion s/p  ORIF and RIA             Nonweightbearing right leg x 8 weeks             Unrestricted range of motion right knee             PT and OT                          PT- please teach HEP for R knee ROM- AROM, PROM. Prone exercises as well. No ROM restrictions.  Quad sets, SLR, LAQ, SAQ, heel slides, stretching, prone flexion and extension                         No pillows under bend of knee when at rest, ok to place under heel to help work on extension or use zero knee bone foam               Dressing changed this am   Changes as needed upon dc   Ok to clean with soap and water   See dc instructions                Ice and elevate             Hinged knee brace when mobilizing     - Pain management:             Continue with current regimen  Anticipate gradual titration down over the next 8 weeks, would like to get off of pain meds by 8-10 weeks post op    - ABL anemia/Hemodynamics            stable  Improved    - Medical issues              Home medications             Alcohol withdrawal protocol                TSH is markedly elevated  Free T4 is normal                                 Consult placed for care management assistance to help arrange follow-up at [facility] either here in [place] or similar facility near [employer] not write for synthroid as pt really needs to follow up with primary provider as she has had such a long lapse in treatment                Nicotine dependence                         Encourage smoking cessation                         Increases her risk for infection as well as continued nonunion                         No nicotine supplements           - DVT/PE prophylaxis:             Patient has no insurance coverage.  Lovenox will be too expensive  Aspirin 325 mg every 12 hours x 4 weeks - ID:              Perioperative antibiotics completed    - Metabolic Bone  Disease:             Vitamin D insufficiency                         Supplement                         Also add calcium                                              Vitamin C - Activity:             Nonweightbearing right leg   - FEN/GI prophylaxis/Foley/Lines:             Regular diet   - Impediments to fracture healing:             Many - Dispo:             dc home today     Jari Pigg, PA-C [facility] [phone] Levi Aland (C) 07/21/2017, 12:01 PM

## 2017-07-21 NOTE — Discharge Summary (Signed)
Orthopaedic Trauma Service (OTS)  Patient ID: LAQUANDRA CARRILLO MRN: [id number] DOB/AGE: 1986-09-05 31 y.o.  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 Marcelino Scot 1. Repair of nonunion right tibia with reamed intramedullary     aspiration autograft. 2. Open reduction and internal fixation of bicondylar tibial plateau     fracture. 3. Arthrotomy with repair of lateral meniscus with vertical mattress     sutures. 4. Stress fluoroscopy for assessment of ligamentous stability after     bone repair.    Discharged Condition: good  Hospital Course:    31 year old female well-known to the orthopedic trauma service for severe right bicondylar tibial plateau fracture that was originally addressed at [facility].  Patient was treated with external fixation and fasciotomies for acute compartment syndrome.  Due to soft tissue issues including infection and persistent swelling we are unable to proceed with early definitive fixation of her fracture.  Subsequently the decision was made to reconstruct on a delayed basis.  Patient had her external fixator removed about 4 weeks ago.  She was given a pin tract holiday in anticipation for reconstruction.  There was a significant bone defect present and it was felt that reamed intramedullary aspirate was the only option for grafting the  defect.  Patient presented to the hospital on 07/18/2017 for the procedure noted above.  Patient tolerated procedure very well.  After surgery she was transferred to the PACU for recovery from anesthesia and then transferred to the orthopedic floor for observation, pain control and therapies.  Patient's hospital stay was really uncomplicated.  Her biggest issue was pain control which was not unanticipated.  We did change up her pain medicine regimen from preop as she was likely tolerant to the medication that she been on them for more than 12 weeks.  Patient did get good relief with fentanyl and Norco with the addition of Flexeril and gabapentin.  She did work with therapies during her stay.  No identifiable issues were noted.  Postoperative day #1 patient did have a significant drop in her H&H and patient was given a transfusion.  Patient tolerated this well but there was some mild transfusion reaction present with some itching.  Patient was covered with Lovenox for DVT and PE prophylaxis.  She was also covered with Ancef for routine perioperative antibiosis.   we did check some basic metabolic labs which was notable for some vitamin D insufficiency as well as labs suggestive of some calcium deficiency.  Her TSH was elevated.  On further questioning patient states that she has been inconsistent with her medications for 2 years and is more often without them and having them.  Patient was started on vitamin supplementation including vitamin D, calcium and vitamin C.  On postoperative day #2 patient was deemed stable for discharge home.  Dressing was changed her wounds look fantastic no signs of infection.  At the time of discharge patient was tolerating a regular diet, voiding without difficulty and mobilizing well on her  own with a wheelchair.  Patient discharged in stable condition on 07/21/2017.  She will follow-up in 10-14 days with orthopedics.  Again strict nonweightbearing for the next 8 weeks.  We will  continue to monitor her closely.  Daily dressing changes can be performed.  Okay to shower and clean the wounds with soap and water.  She will wear her hinged knee brace immobilizing.  Unrestricted range of motion of her right knee is encouraged.  Home exercise program has been reviewed with her to include the knee and ankle.    Consults: None  Significant Diagnostic Studies: labs:   Results for KARMA, ANSLEY (MRN [id number]) as of 07/21/2017 12:33  Ref. Range 07/20/2017 04:32  Sodium Latest Ref Range: 135 - 145 mmol/L 140  Potassium Latest Ref Range: 3.5 - 5.1 mmol/L 3.7  Chloride Latest Ref Range: 101 - 111 mmol/L 110  CO2 Latest Ref Range: 22 - 32 mmol/L 27  Glucose Latest Ref Range: 65 - 99 mg/dL 106 (H)  BUN Latest Ref Range: 6 - 20 mg/dL 6  Creatinine Latest Ref Range: 0.44 - 1.00 mg/dL 0.47  Calcium Latest Ref Range: 8.9 - 10.3 mg/dL 8.1 (L)  Anion gap Latest Ref Range: 5 - 15  3 (L)  GFR, Est Non African American Latest Ref Range: >60 mL/min >60  GFR, Est African American Latest Ref Range: >60 mL/min >60  WBC Latest Ref Range: 4.0 - 10.5 K/uL 6.3  RBC Latest Ref Range: 3.87 - 5.11 MIL/uL 2.92 (L)  Hemoglobin Latest Ref Range: 12.0 - 15.0 g/dL 6.7 (LL)  HCT Latest Ref Range: 36.0 - 46.0 % 22.6 (L)  MCV Latest Ref Range: 78.0 - 100.0 fL 77.4 (L)  MCH Latest Ref Range: 26.0 - 34.0 pg 22.9 (L)  MCHC Latest Ref Range: 30.0 - 36.0 g/dL 29.6 (L)  RDW Latest Ref Range: 11.5 - 15.5 % 19.8 (H)  Platelets Latest Ref Range: 150 - 400 K/uL 189  T4,Free(Direct) Latest Ref Range: 0.61 - 1.12 ng/dL 0.87   Results for NICKAYLA, MCINNIS (MRN [id number]) as of 07/21/2017 12:33  Ref. Range 07/20/2017 04:32 07/21/2017 04:02  WBC Latest Ref Range: 4.0 - 10.5 K/uL 6.3 5.8  RBC Latest Ref Range: 3.87 - 5.11 MIL/uL 2.92 (L) 3.65 (L)  Hemoglobin Latest Ref Range: 12.0 - 15.0 g/dL 6.7 (LL) 8.6 (L)  HCT Latest Ref Range: 36.0 - 46.0 % 22.6 (L) 28.1 (L)  MCV Latest Ref Range: 78.0 - 100.0 fL 77.4  (L) 77.0 (L)  MCH Latest Ref Range: 26.0 - 34.0 pg 22.9 (L) 23.6 (L)  MCHC Latest Ref Range: 30.0 - 36.0 g/dL 29.6 (L) 30.6  RDW Latest Ref Range: 11.5 - 15.5 % 19.8 (H) 18.8 (H)  Platelets Latest Ref Range: 150 - 400 K/uL 189 181  Results for NOURA, PURPURA (MRN [id number]) as of 07/21/2017 12:33  Ref. Range 07/21/2017 00:92  BASIC METABOLIC PANEL Unknown Rpt (A)  Sodium Latest Ref Range: 135 - 145 mmol/L 136  Potassium Latest Ref Range: 3.5 - 5.1 mmol/L 3.3 (L)  Chloride Latest Ref Range: 101 - 111 mmol/L 105  CO2 Latest Ref Range: 22 - 32 mmol/L 26  Glucose Latest Ref Range: 65 - 99 mg/dL 95  BUN Latest Ref Range: 6 - 20 mg/dL 6  Creatinine Latest Ref Range: 0.44 - 1.00 mg/dL 0.49  Calcium Latest Ref Range: 8.9 - 10.3 mg/dL 8.4 (L)  Anion gap Latest Ref Range: 5 - 15  5  Results for Tolbert, Shellye  Jerilynn Mages (MRN [id number]) as of 07/21/2017 12:33  Ref. Range 07/18/2017 09:00  Hemoglobin A1C Latest Ref Range: 4.8 - 5.6 % 5.5  PTH, Intact Latest Ref Range: 15 - 65 pg/mL 22  Calcium, Total (PTH) Latest Ref Range: 8.7 - 10.2 mg/dL 9.4  PTH Interp Unknown Comment  TSH Latest Ref Range: 0.350 - 4.500 uIU/mL 13.342 (H)  Results for MARLOW, HENDRIE (MRN [id number]) as of 07/21/2017 12:33  Ref. Range 07/18/2017 09:00  Vit D, 1,25-Dihydroxy Latest Ref Range: 19.9 - 79.3 pg/mL 94.6 (H)  Vitamin D, 25-Hydroxy Latest Ref Range: 30.0 - 100.0 ng/mL 22.4 (L)  Results for ANTONETTA, CLANTON (MRN [id number]) as of 07/21/2017 12:33  Ref. Range 07/18/2017 09:00  Calcium, Ionized, Serum Latest Ref Range: 4.5 - 5.6 mg/dL 5.1  Phosphorus Latest Ref Range: 2.5 - 4.6 mg/dL 2.6  Magnesium Latest Ref Range: 1.7 - 2.4 mg/dL 2.1    Treatments: IV hydration, antibiotics: Ancef, analgesia: Fentanyl and Norco, anticoagulation: LMW heparin and aspirin at discharge, therapies: PT, OT and RN and surgery: As above  Discharge Exam:        Orthopedic Trauma Service Progress Note    Patient ID: LIZVETTE LIGHTSEY MRN:  [id number] DOB/AGE: 1986/06/21 31 y.o.   Subjective:   Doing fine Wants to go home States she is not getting pain meds when asked for  Tolerated transfusion yesterday  Looks like pt only received 1 unit of PRBCs despite order for 2              Possible mild transfusion reaction at end of first unit, itching noted    No residual symptoms      Review of Systems  Constitutional: Negative for chills and fever.  Respiratory: Negative for shortness of breath and wheezing.   Cardiovascular: Negative for chest pain and palpitations.  Gastrointestinal: Negative for nausea and vomiting.      Objective:    VITALS:         Vitals:    07/20/17 1654 07/20/17 1715 07/20/17 2103 07/21/17 0428  BP: 125/64 (!) 125/53 117/66 127/80  Pulse: (!) 113 (!) 115 (!) 112 (!) 105  Resp: 16   20 (!) 21  Temp: 99.6 F (37.6 C) 99.6 F (37.6 C) 99.1 F (37.3 C) 98.7 F (37.1 C)  TempSrc: Oral Oral Oral Axillary  SpO2:     98% 100%  Weight:          Height:              Estimated body mass index is 29.18 kg/m as calculated from the following:   Height as of this encounter: 5\' 4"  (1.626 m).   Weight as of this encounter: 77.1 kg (170 lb).     Intake/Output      11/10 0701 - 11/11 0700 11/11 0701 - 11/12 0700   P.O. 1320 480   Blood 303    Total Intake(mL/kg) 1623 (21.1) 480 (6.2)   Urine (mL/kg/hr)     Total Output     Net +1623 +480        Urine Occurrence 3 x 1 x   Stool Occurrence 1 x       LABS   Lab Results Last 24 Hours       Results for orders placed or performed during the hospital encounter of 07/18/17 (from the past 24 hour(s))  Basic metabolic panel     Status: Abnormal    Collection Time: 07/21/17  4:02 AM  Result Value  Ref Range    Sodium 136 135 - 145 mmol/L    Potassium 3.3 (L) 3.5 - 5.1 mmol/L    Chloride 105 101 - 111 mmol/L    CO2 26 22 - 32 mmol/L    Glucose, Bld 95 65 - 99 mg/dL    BUN 6 6 - 20 mg/dL    Creatinine, Ser 0.49 0.44 - 1.00 mg/dL    Calcium  8.4 (L) 8.9 - 10.3 mg/dL    GFR calc non Af Amer >60 >60 mL/min    GFR calc Af Amer >60 >60 mL/min    Anion gap 5 5 - 15  CBC     Status: Abnormal    Collection Time: 07/21/17  4:02 AM  Result Value Ref Range    WBC 5.8 4.0 - 10.5 K/uL    RBC 3.65 (L) 3.87 - 5.11 MIL/uL    Hemoglobin 8.6 (L) 12.0 - 15.0 g/dL    HCT 28.1 (L) 36.0 - 46.0 %    MCV 77.0 (L) 78.0 - 100.0 fL    MCH 23.6 (L) 26.0 - 34.0 pg    MCHC 30.6 30.0 - 36.0 g/dL    RDW 18.8 (H) 11.5 - 15.5 %    Platelets 181 150 - 400 K/uL          PHYSICAL EXAM:    AYT:KZSWFUX around in WC, NAD Lungs: no wheezes, decreased at bases o/w clear Cardiac: regular  Abd: + BS, NTND Ext:       Right Lower Extremity              Dressing removed, all bunched up at ankle             Incision looks great to lateral lower leg, medial poke incision stable                          No drainage                         No signs of infection                                      No erythema             Distal motor and sensory functions intact             Ext warm             + DP pulse             No DCT             Compartments are soft              ROM ~ 5-85 degrees w/o discomfort             Moderate swelling but expected    Assessment/Plan: 3 Days Post-Op    Principal Problem:   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 Active Problems:   Alcohol use disorder, severe, dependence ([facility])   Major depressive disorder, recurrent episode with melancholic features ([facility])   Hypothyroidism   Nicotine dependence   Hepatitis C   GERD (gastroesophageal reflux disease)   Anxiety               Anti-infectives (From admission, onward)    Start     Dose/Rate Route  Frequency Ordered Stop    07/18/17 1730   ceFAZolin (ANCEF) IVPB 1 g/50 mL premix     1 g 100 mL/hr over 30 Minutes Intravenous Every 6 hours 07/18/17 1723 07/19/17 0540    07/18/17 0900   ceFAZolin (ANCEF) IVPB 2g/100 mL premix     2 g 200 mL/hr over 30 Minutes  Intravenous On call to O.R. 07/18/17 0850 07/18/17 1059     .   POD/HD#: 61   31 year old female with complex right bicondylar tibial plateau nonunion     - right bicondylar tibial plateau fracture nonunion s/p  ORIF and RIA             Nonweightbearing right leg x 8 weeks             Unrestricted range of motion right knee             PT and OT                          PT- please teach HEP for R knee ROM- AROM, PROM. Prone exercises as well. No ROM restrictions.  Quad sets, SLR, LAQ, SAQ, heel slides, stretching, prone flexion and extension                         No pillows under bend of knee when at rest, ok to place under heel to help work on extension or use zero knee bone foam               Dressing changed this am                         Changes as needed upon dc                         Ok to clean with soap and water                         See dc instructions                Ice and elevate             Hinged knee brace when mobilizing     - Pain management:             Continue with current regimen             Anticipate gradual titration down over the next 8 weeks, would like to get off of pain meds by 8-10 weeks post op    - ABL anemia/Hemodynamics            stable             Improved    - Medical issues              Home medications             Alcohol withdrawal protocol                TSH is markedly elevated                         Free T4 is normal  Consult placed for care management assistance to help arrange follow-up at [facility] either here in [place] or similar facility near [employer] not write for synthroid as pt really needs to follow up with primary provider as she has had such a long lapse in treatment                Nicotine dependence                         Encourage smoking cessation                         Increases her risk for infection as well as  continued nonunion                         No nicotine supplements           - DVT/PE prophylaxis:             Patient has no insurance coverage.  Lovenox will be too expensive             Aspirin 325 mg every 12 hours x 4 weeks - ID:              Perioperative antibiotics completed    - Metabolic Bone Disease:             Vitamin D insufficiency                         Supplement                         Also add calcium                                              Vitamin C - Activity:             Nonweightbearing right leg   - FEN/GI prophylaxis/Foley/Lines:             Regular diet   - Impediments to fracture healing:             Many - Dispo:             dc home today      Disposition: 01-Home or Self Care  Discharge Instructions    Call MD / Call 911   Complete by:  As directed    If you experience chest pain or shortness of breath, CALL 911 and be transported to the hospital emergency room.  If you develope a fever above 101 F, pus (white drainage) or increased drainage or redness at the wound, or calf pain, call your surgeon's office.   Constipation Prevention   Complete by:  As directed    Drink plenty of fluids.  Prune juice may be helpful.  You may use a stool softener, such as Colace (over the counter) 100 mg twice a day.  Use MiraLax (over the counter) for constipation as needed.   Diet general   Complete by:  As directed    Discharge instructions   Complete by:  As directed    Orthopaedic Trauma Service Discharge Instructions   General Discharge Instructions  WEIGHT BEARING STATUS: Nonweightbearing right leg  RANGE OF MOTION/ACTIVITY: Active range of motion right knee and ankle.   use hinged knee brace when mobilizing but maintain nonweightbearing on right leg   Activity as tolerated while maintaining Weightbearing restrictions  Home Exercise Program for R knee ROM- AROM, PROM. Prone exercises as well. No ROM restrictions.  Quad sets, SLR, LAQ, SAQ,  heel slides, stretching, prone flexion and extension. You should have exercise sheet that was reviewed with you by therapy   No pillows under bend of knee when at rest, ok to place under heel to help work on extension or use the blue bone foam to elevate you leg and keep knee straight at rest    Wound Care: Daily wound care starting on 07/22/2017.  Please see below   Discharge Wound Care Instructions  Do NOT apply any ointments, solutions or lotions to pin sites or surgical wounds.  These prevent needed drainage and even though solutions like hydrogen peroxide kill bacteria, they also damage cells lining the pin sites that help fight infection.  Applying lotions or ointments can keep the wounds moist and can cause them to breakdown and open up as well. This can increase the risk for infection. When in doubt call the office.  Surgical incisions should be dressed daily.  If any drainage is noted, use one layer of adaptic, then gauze, Kerlix, and an ace wrap.  Once the incision is completely dry and without drainage, it may be left open to air out.  Showering may begin 36-48 hours later.  Cleaning gently with soap and water.  Traumatic wounds should be dressed daily as well.    One layer of adaptic, gauze, Kerlix, then ace wrap.  The adaptic can be discontinued once the draining has ceased    If you have a wet to dry dressing: wet the gauze with saline the squeeze as much saline out so the gauze is moist (not soaking wet), place moistened gauze over wound, then place a dry gauze over the moist one, followed by Kerlix wrap, then ace wrap.  DVT/PE prophylaxis:  Aspirin 325 mg every 12 hours x 4 weeks   Diet: as you were eating previously.  Can use over the counter stool softeners and bowel preparations, such as Miralax, to help with bowel movements.  Narcotics can be constipating.  Be sure to drink plenty of fluids  PAIN MEDICATION USE AND EXPECTATIONS  You have likely been given narcotic  medications to help control your pain.  After a traumatic event that results in an fracture (broken bone) with or without surgery, it is ok to use narcotic pain medications to help control one's pain.  We understand that everyone responds to pain differently and each individual patient will be evaluated on a regular basis for the continued need for narcotic medications. Ideally, narcotic medication use should last no more than 6-8 weeks (coinciding with fracture healing).   As a patient it is your responsibility as well to monitor narcotic medication use and report the amount and frequency you use these medications when you come to your office visit.   We would also advise that if you are using narcotic medications, you should take a dose prior to therapy to maximize you participation.  IF YOU ARE ON NARCOTIC MEDICATIONS IT IS NOT PERMISSIBLE TO OPERATE A MOTOR VEHICLE (MOTORCYCLE/CAR/TRUCK/MOPED) OR HEAVY MACHINERY DO NOT MIX NARCOTICS WITH  OTHER CNS (CENTRAL NERVOUS SYSTEM) DEPRESSANTS SUCH AS ALCOHOL   STOP SMOKING OR USING NICOTINE PRODUCTS!!!!  As discussed nicotine severely impairs your body's ability to heal surgical and traumatic wounds but also impairs bone healing.  Wounds and bone heal by forming microscopic blood vessels (angiogenesis) and nicotine is a vasoconstrictor (essentially, shrinks blood vessels).  Therefore, if vasoconstriction occurs to these microscopic blood vessels they essentially disappear and are unable to deliver necessary nutrients to the healing tissue.  This is one modifiable factor that you can do to dramatically increase your chances of healing your injury.    (This means no smoking, no nicotine gum, patches, etc)  DO NOT USE NONSTEROIDAL ANTI-INFLAMMATORY DRUGS (NSAID'S)  Using products such as Advil (ibuprofen), Aleve (naproxen), Motrin (ibuprofen) for additional pain control during fracture healing can delay and/or prevent the healing response.  If you would like to  take over the counter (OTC) medication, Tylenol (acetaminophen) is ok.  However, some narcotic medications that are given for pain control contain acetaminophen as well. Therefore, you should not exceed more than 4000 mg of tylenol in a day if you do not have liver disease.  Also note that there are may OTC medicines, such as cold medicines and allergy medicines that my contain tylenol as well.  If you have any questions about medications and/or interactions please ask your doctor/PA or your pharmacist.      ICE AND ELEVATE INJURED/OPERATIVE EXTREMITY  Using ice and elevating the injured extremity above your heart can help with swelling and pain control.  Icing in a pulsatile fashion, such as 20 minutes on and 20 minutes off, can be followed.    Do not place ice directly on skin. Make sure there is a barrier between to skin and the ice pack.    Using frozen items such as frozen peas works well as the conform nicely to the are that needs to be iced.  USE AN ACE WRAP OR TED HOSE FOR SWELLING CONTROL  In addition to icing and elevation, Ace wraps or TED hose are used to help limit and resolve swelling.  It is recommended to use Ace wraps or TED hose until you are informed to stop.    When using Ace Wraps start the wrapping distally (farthest away from the body) and wrap proximally (closer to the body)   Example: If you had surgery on your leg or thing and you do not have a splint on, start the ace wrap at the toes and work your way up to the thigh        If you had surgery on your upper extremity and do not have a splint on, start the ace wrap at your fingers and work your way up to the upper arm  [place]: [phone]   Driving restrictions   Complete by:  As directed    No driving   Increase activity slowly as tolerated   Complete by:  As directed    Non weight bearing   Complete by:  As directed    Laterality:  right   Extremity:  Lower     Allergies  as of 07/21/2017      Reactions   Ondansetron Nausea And Vomiting      Medication List    STOP taking these medications   ibuprofen 800 MG tablet Commonly known as:  ADVIL,MOTRIN   nicotine 21 mg/24hr patch Commonly known as:  NICODERM CQ - dosed in mg/24  hours     TAKE these medications   ascorbic acid 500 MG tablet Commonly known as:  VITAMIN C Take 1 tablet (500 mg total) daily by mouth. Start taking on:  07/22/2017   aspirin EC 325 MG tablet Take 1 tablet (325 mg total) every 12 (twelve) hours by mouth.   calcium citrate 950 MG tablet Commonly known as:  CALCITRATE - dosed in mg elemental calcium Take 1 tablet (200 mg of elemental calcium total) 2 (two) times daily by mouth.   cyclobenzaprine 10 MG tablet Commonly known as:  FLEXERIL Take 1 tablet (10 mg total) 3 (three) times daily as needed by mouth for muscle spasms.   docusate sodium 100 MG capsule Commonly known as:  COLACE Take 1 capsule (100 mg total) 2 (two) times daily by mouth.   gabapentin 300 MG capsule Commonly known as:  NEURONTIN Take 1 capsule (300 mg total) 3 (three) times daily by mouth.   HYDROcodone-acetaminophen 10-325 MG tablet Commonly known as:  NORCO Take 1-2 tablets every 6 (six) hours as needed by mouth for moderate pain or severe pain.   levothyroxine 75 MCG tablet Commonly known as:  SYNTHROID, LEVOTHROID Take 75 mcg daily before breakfast by mouth.   MULTIPLE VITAMINS/WOMENS PO Take 1 tablet by mouth daily.   multivitamin with minerals Tabs tablet Take 1 tablet by mouth daily. Vitamin supplement   naphazoline-glycerin 0.012-0.2 % Soln Commonly known as:  CLEAR EYES Place 1-2 drops into both eyes 4 (four) times daily as needed for irritation.   traMADol 50 MG tablet Commonly known as:  ULTRAM Take 1-2 tablets (50-100 mg total) every 12 (twelve) hours as needed by mouth (breakthrough pain only).   Vitamin D3 5000 units Tabs Take 1 tablet (5,000 Units total) daily by  mouth.            Discharge Care Instructions  (From admission, onward)        Start     Ordered   07/21/17 0000  Non weight bearing    Question Answer Comment  Laterality right   Extremity Lower      07/21/17 1229     Follow-up Information    [facility] Follow up.   Why:  Call on Monday to schedule an appointment to establish a primary care provider. follow up for your thyroid  Contact information: [place] [address] [phone]       Altamese [place], MD. Schedule an appointment as soon as possible for a visit in 2 week(s).   Specialty:  Orthopedic Surgery Contact information: [address]  [place] [address] [phone]           Discharge Instructions and Plan:  31 year old female with complex right bicondylar tibial plateau nonunion     - right bicondylar tibial plateau fracture nonunion s/p  ORIF and RIA             Nonweightbearing right leg x 8 weeks             Unrestricted range of motion right knee             PT and OT                          PT- please teach HEP for R knee ROM- AROM, PROM. Prone exercises as well. No ROM restrictions.  Quad sets, SLR, LAQ, SAQ, heel slides, stretching, prone  flexion and extension                         No pillows under bend of knee when at rest, ok to place under heel to help work on extension or use zero knee bone foam               Dressing changed this am                         Changes as needed upon dc                         Ok to clean with soap and water                         See dc instructions                Ice and elevate             Hinged knee brace when mobilizing     - Pain management:             Continue with current regimen             Anticipate gradual titration down over the next 8 weeks, would like to get off of pain meds by 8-10 weeks post op    - ABL anemia/Hemodynamics             stable             Improved    - Medical issues              Home medications             Alcohol withdrawal protocol                TSH is markedly elevated                         Free T4 is normal                                 Consult placed for care management assistance to help arrange follow-up at [facility] wellness center either here in [place] or similar facility near [employer] not write for synthroid as pt really needs to follow up with primary provider as she has had such a long lapse in treatment                Nicotine dependence                         Encourage smoking cessation                         Increases her risk for infection as well as continued nonunion                         No nicotine supplements           - DVT/PE prophylaxis:  Patient has no insurance coverage.  Lovenox will be too expensive             Aspirin 325 mg every 12 hours x 4 weeks   - Metabolic Bone Disease:             Vitamin D insufficiency                         Supplement                         Also add calcium                                              Vitamin C - Activity:             Nonweightbearing right leg   - FEN/GI prophylaxis/Foley/Lines:             Regular diet   - Impediments to fracture healing:             Many - Dispo:             dc home today    Signed:  Jari Pigg, PA-C [facility] [phone] (P) 07/21/2017, 12:31 PM

## 2017-07-22 LAB — T3, FREE: T3 FREE: 2.3 pg/mL (ref 2.0–4.4)

## 2017-11-05 ENCOUNTER — Inpatient Hospital Stay
Admission: AD | Admit: 2017-11-05 | Discharge: 2017-11-08 | DRG: 885 | Disposition: A | Discharge status: Rehabilitation Facility | Payer: No Typology Code available for payment source | Attending: Psychiatry | Admitting: Psychiatry

## 2017-11-05 ENCOUNTER — Encounter: Payer: Self-pay | Admitting: Psychiatry

## 2017-11-05 DIAGNOSIS — E039 Hypothyroidism, unspecified: Secondary | ICD-10-CM | POA: Diagnosis present

## 2017-11-05 DIAGNOSIS — Z9181 History of falling: Secondary | ICD-10-CM | POA: Diagnosis not present

## 2017-11-05 DIAGNOSIS — Z638 Other specified problems related to primary support group: Secondary | ICD-10-CM

## 2017-11-05 DIAGNOSIS — F419 Anxiety disorder, unspecified: Secondary | ICD-10-CM | POA: Diagnosis present

## 2017-11-05 DIAGNOSIS — Z8349 Family history of other endocrine, nutritional and metabolic diseases: Secondary | ICD-10-CM

## 2017-11-05 DIAGNOSIS — Z8719 Personal history of other diseases of the digestive system: Secondary | ICD-10-CM | POA: Diagnosis not present

## 2017-11-05 DIAGNOSIS — F17210 Nicotine dependence, cigarettes, uncomplicated: Secondary | ICD-10-CM | POA: Diagnosis present

## 2017-11-05 DIAGNOSIS — Z888 Allergy status to other drugs, medicaments and biological substances status: Secondary | ICD-10-CM | POA: Diagnosis not present

## 2017-11-05 DIAGNOSIS — Z79899 Other long term (current) drug therapy: Secondary | ICD-10-CM

## 2017-11-05 DIAGNOSIS — G8929 Other chronic pain: Secondary | ICD-10-CM | POA: Diagnosis present

## 2017-11-05 DIAGNOSIS — F339 Major depressive disorder, recurrent, unspecified: Secondary | ICD-10-CM | POA: Diagnosis not present

## 2017-11-05 DIAGNOSIS — Z811 Family history of alcohol abuse and dependence: Secondary | ICD-10-CM

## 2017-11-05 DIAGNOSIS — Z9049 Acquired absence of other specified parts of digestive tract: Secondary | ICD-10-CM | POA: Diagnosis not present

## 2017-11-05 DIAGNOSIS — Z818 Family history of other mental and behavioral disorders: Secondary | ICD-10-CM | POA: Diagnosis not present

## 2017-11-05 DIAGNOSIS — F338 Other recurrent depressive disorders: Secondary | ICD-10-CM | POA: Diagnosis present

## 2017-11-05 DIAGNOSIS — F603 Borderline personality disorder: Secondary | ICD-10-CM

## 2017-11-05 DIAGNOSIS — Z91410 Personal history of adult physical and sexual abuse: Secondary | ICD-10-CM | POA: Diagnosis not present

## 2017-11-05 DIAGNOSIS — F10239 Alcohol dependence with withdrawal, unspecified: Secondary | ICD-10-CM | POA: Diagnosis present

## 2017-11-05 DIAGNOSIS — K5900 Constipation, unspecified: Secondary | ICD-10-CM | POA: Diagnosis present

## 2017-11-05 DIAGNOSIS — G4700 Insomnia, unspecified: Secondary | ICD-10-CM | POA: Diagnosis present

## 2017-11-05 DIAGNOSIS — Z915 Personal history of self-harm: Secondary | ICD-10-CM

## 2017-11-05 DIAGNOSIS — R000 Tachycardia, unspecified: Secondary | ICD-10-CM | POA: Diagnosis present

## 2017-11-05 DIAGNOSIS — Z560 Unemployment, unspecified: Secondary | ICD-10-CM

## 2017-11-05 DIAGNOSIS — Z79890 Hormone replacement therapy (postmenopausal): Secondary | ICD-10-CM

## 2017-11-05 DIAGNOSIS — R45851 Suicidal ideations: Secondary | ICD-10-CM | POA: Diagnosis present

## 2017-11-05 DIAGNOSIS — F1020 Alcohol dependence, uncomplicated: Secondary | ICD-10-CM | POA: Diagnosis present

## 2017-11-05 HISTORY — DX: Gastro-esophageal laceration-hemorrhage syndrome: K22.6

## 2017-11-05 HISTORY — DX: Gastrointestinal hemorrhage, unspecified: K92.2

## 2017-11-05 HISTORY — DX: Thrombocytopenia, unspecified: D69.6

## 2017-11-05 MED ORDER — ACETAMINOPHEN 325 MG PO TABS
650.0000 mg | ORAL_TABLET | Freq: Four times a day (QID) | ORAL | Status: DC | PRN
  Administered 2017-11-07 – 2017-11-08 (×2): 650 mg via ORAL
  Filled 2017-11-05 (×2): qty 2

## 2017-11-05 MED ORDER — LORAZEPAM 2 MG/ML IJ SOLN: 1.0000 mg | Freq: Four times a day (QID) | INTRAMUSCULAR | Status: DC | PRN

## 2017-11-05 MED ORDER — VITAMIN B-1 100 MG PO TABS
100.0000 mg | ORAL_TABLET | Freq: Every day | ORAL | Status: DC
  Administered 2017-11-06 – 2017-11-08 (×3): 100 mg via ORAL
  Filled 2017-11-05 (×3): qty 1

## 2017-11-05 MED ORDER — FOLIC ACID 1 MG PO TABS
1.0000 mg | ORAL_TABLET | Freq: Every day | ORAL | Status: DC
  Administered 2017-11-06 – 2017-11-08 (×3): 1 mg via ORAL
  Filled 2017-11-05 (×3): qty 1

## 2017-11-05 MED ORDER — CHLORDIAZEPOXIDE HCL 25 MG PO CAPS
25.0000 mg | ORAL_CAPSULE | Freq: Four times a day (QID) | ORAL | Status: DC
  Administered 2017-11-05 – 2017-11-06 (×3): 25 mg via ORAL
  Filled 2017-11-05 (×3): qty 1

## 2017-11-05 MED ORDER — MAGNESIUM HYDROXIDE 400 MG/5ML PO SUSP: 30.0000 mL | Freq: Every day | ORAL | Status: DC | PRN

## 2017-11-05 MED ORDER — ALUM & MAG HYDROXIDE-SIMETH 200-200-20 MG/5ML PO SUSP
30.0000 mL | ORAL | Status: DC | PRN
  Administered 2017-11-06: 30 mL via ORAL
  Filled 2017-11-05: qty 30

## 2017-11-05 MED ORDER — GABAPENTIN 300 MG PO CAPS
300.0000 mg | ORAL_CAPSULE | Freq: Three times a day (TID) | ORAL | Status: DC
  Administered 2017-11-06: 300 mg via ORAL
  Filled 2017-11-05: qty 1

## 2017-11-05 MED ORDER — LORAZEPAM 1 MG PO TABS
1.0000 mg | ORAL_TABLET | Freq: Four times a day (QID) | ORAL | Status: DC | PRN
  Administered 2017-11-06: 1 mg via ORAL
  Filled 2017-11-05: qty 1

## 2017-11-05 MED ORDER — TRAZODONE HCL 100 MG PO TABS
100.0000 mg | ORAL_TABLET | Freq: Every day | ORAL | Status: DC
  Administered 2017-11-05: 100 mg via ORAL
  Filled 2017-11-05: qty 1

## 2017-11-05 MED ORDER — THIAMINE HCL 100 MG/ML IJ SOLN
100.0000 mg | Freq: Every day | INTRAMUSCULAR | Status: DC
  Filled 2017-11-05 (×3): qty 1

## 2017-11-05 MED ORDER — LORAZEPAM 2 MG PO TABS: 0.0000 mg | ORAL_TABLET | Freq: Two times a day (BID) | ORAL | Status: DC

## 2017-11-05 MED ORDER — LORAZEPAM 2 MG PO TABS
0.0000 mg | ORAL_TABLET | Freq: Four times a day (QID) | ORAL | Status: DC
  Administered 2017-11-05: 2 mg via ORAL
  Filled 2017-11-05: qty 2

## 2017-11-05 MED ORDER — ADULT MULTIVITAMIN W/MINERALS CH
1.0000 | ORAL_TABLET | Freq: Every day | ORAL | Status: DC
  Administered 2017-11-06 – 2017-11-08 (×3): 1 via ORAL
  Filled 2017-11-05 (×3): qty 1

## 2017-11-06 ENCOUNTER — Encounter: Payer: Self-pay | Admitting: Psychiatry

## 2017-11-06 ENCOUNTER — Other Ambulatory Visit: Payer: Self-pay

## 2017-11-06 DIAGNOSIS — F339 Major depressive disorder, recurrent, unspecified: Secondary | ICD-10-CM

## 2017-11-06 DIAGNOSIS — F603 Borderline personality disorder: Secondary | ICD-10-CM

## 2017-11-06 LAB — COMPREHENSIVE METABOLIC PANEL
ALK PHOS: 156 U/L — AB (ref 38–126)
ALT: 83 U/L — AB (ref 14–54)
AST: 279 U/L — AB (ref 15–41)
Albumin: 3.4 g/dL — ABNORMAL LOW (ref 3.5–5.0)
Anion gap: 11 (ref 5–15)
BILIRUBIN TOTAL: 2.2 mg/dL — AB (ref 0.3–1.2)
BUN: 6 mg/dL (ref 6–20)
CALCIUM: 9.5 mg/dL (ref 8.9–10.3)
CHLORIDE: 103 mmol/L (ref 101–111)
CO2: 22 mmol/L (ref 22–32)
CREATININE: 0.42 mg/dL — AB (ref 0.44–1.00)
GFR calc Af Amer: >60 mL/min (ref >60)
GFR calc non Af Amer: >60 mL/min (ref >60)
Glucose, Bld: 116 mg/dL — ABNORMAL HIGH (ref 65–99)
Potassium: 3.9 mmol/L (ref 3.5–5.1)
Sodium: 136 mmol/L (ref 135–145)
Total Protein: 7.8 g/dL (ref 6.5–8.1)

## 2017-11-06 LAB — LIPID PANEL
Cholesterol: 332 mg/dL — ABNORMAL HIGH (ref 0–200)
HDL: 19 mg/dL — AB (ref >40)
LDL CALC: 249 mg/dL — AB (ref 0–99)
Total CHOL/HDL Ratio: 17.5 RATIO
Triglycerides: 318 mg/dL — ABNORMAL HIGH (ref <150)
VLDL: 64 mg/dL — ABNORMAL HIGH (ref 0–40)

## 2017-11-06 LAB — CBC
HEMATOCRIT: 38.3 % (ref 35.0–47.0)
HEMOGLOBIN: 12 g/dL (ref 12.0–16.0)
MCH: 29.7 pg (ref 26.0–34.0)
MCHC: 31.4 g/dL — ABNORMAL LOW (ref 32.0–36.0)
MCV: 94.5 fL (ref 80.0–100.0)
PLATELETS: 90 10*3/uL — AB (ref 150–440)
RBC: 4.06 MIL/uL (ref 3.80–5.20)
RDW: 23.9 % — ABNORMAL HIGH (ref 11.5–14.5)
WBC: 2.3 10*3/uL — AB (ref 3.6–11.0)

## 2017-11-06 LAB — T4, FREE: Free T4: 0.63 ng/dL (ref 0.61–1.12)

## 2017-11-06 LAB — HEMOGLOBIN A1C
Hgb A1c MFr Bld: 4.7 % — ABNORMAL LOW (ref 4.8–5.6)
Mean Plasma Glucose: 88.19 mg/dL

## 2017-11-06 LAB — TSH: TSH: 32.738 u[IU]/mL — AB (ref 0.350–4.500)

## 2017-11-06 MED ORDER — HYDROCODONE-ACETAMINOPHEN 5-325 MG PO TABS
1.0000 | ORAL_TABLET | Freq: Four times a day (QID) | ORAL | Status: DC | PRN
Start: 2017-11-06 — End: 2017-11-06

## 2017-11-06 MED ORDER — LORAZEPAM 2 MG PO TABS: 0.0000 mg | ORAL_TABLET | Freq: Four times a day (QID) | ORAL | Status: DC | PRN

## 2017-11-06 MED ORDER — GABAPENTIN 300 MG PO CAPS: 900.0000 mg | ORAL_CAPSULE | Freq: Three times a day (TID) | ORAL | Status: DC

## 2017-11-06 MED ORDER — GABAPENTIN 300 MG PO CAPS
900.0000 mg | ORAL_CAPSULE | Freq: Three times a day (TID) | ORAL | Status: DC
  Administered 2017-11-06 – 2017-11-08 (×7): 900 mg via ORAL
  Filled 2017-11-06 (×7): qty 3

## 2017-11-06 MED ORDER — HYDROCODONE-ACETAMINOPHEN 5-325 MG PO TABS: 1.0000 | ORAL_TABLET | Freq: Four times a day (QID) | ORAL | Status: DC | PRN

## 2017-11-06 MED ORDER — QUETIAPINE FUMARATE 100 MG PO TABS
100.0000 mg | ORAL_TABLET | Freq: Every day | ORAL | Status: DC
  Administered 2017-11-06 – 2017-11-07 (×2): 100 mg via ORAL
  Filled 2017-11-06 (×2): qty 1

## 2017-11-06 MED ORDER — GABAPENTIN 300 MG PO CAPS: 600.0000 mg | ORAL_CAPSULE | Freq: Three times a day (TID) | ORAL | Status: DC

## 2017-11-06 MED ORDER — CHLORDIAZEPOXIDE HCL 25 MG PO CAPS
25.0000 mg | ORAL_CAPSULE | Freq: Three times a day (TID) | ORAL | Status: DC
  Administered 2017-11-06 – 2017-11-07 (×3): 25 mg via ORAL
  Filled 2017-11-06 (×3): qty 1

## 2017-11-06 MED ORDER — CARBAMAZEPINE 200 MG PO TABS
200.0000 mg | ORAL_TABLET | Freq: Two times a day (BID) | ORAL | Status: DC
  Administered 2017-11-07 – 2017-11-08 (×3): 200 mg via ORAL
  Filled 2017-11-06 (×3): qty 1

## 2017-11-06 MED ORDER — LORAZEPAM 2 MG PO TABS
0.0000 mg | ORAL_TABLET | ORAL | Status: DC | PRN
  Administered 2017-11-06: 4 mg via ORAL
  Filled 2017-11-06: qty 2

## 2017-11-06 MED ORDER — TRAMADOL HCL 50 MG PO TABS
50.0000 mg | ORAL_TABLET | Freq: Four times a day (QID) | ORAL | Status: DC | PRN
  Administered 2017-11-07 – 2017-11-08 (×2): 50 mg via ORAL
  Filled 2017-11-06 (×2): qty 1

## 2017-11-06 MED ORDER — LORAZEPAM 2 MG PO TABS
0.0000 mg | ORAL_TABLET | Freq: Four times a day (QID) | ORAL | Status: DC | PRN
  Administered 2017-11-07 – 2017-11-08 (×5): 2 mg via ORAL
  Filled 2017-11-06: qty 1
  Filled 2017-11-06: qty 2
  Filled 2017-11-06 (×4): qty 1

## 2017-11-06 MED ORDER — CARBAMAZEPINE 200 MG PO TABS
200.0000 mg | ORAL_TABLET | Freq: Four times a day (QID) | ORAL | Status: DC
  Administered 2017-11-06: 200 mg via ORAL
  Filled 2017-11-06: qty 1

## 2017-11-06 MED ORDER — LEVOTHYROXINE SODIUM 75 MCG PO TABS
75.0000 ug | ORAL_TABLET | Freq: Every day | ORAL | Status: DC
  Administered 2017-11-07 – 2017-11-08 (×2): 75 ug via ORAL
  Filled 2017-11-06 (×2): qty 1

## 2017-11-06 MED ORDER — TRAZODONE HCL 100 MG PO TABS
100.0000 mg | ORAL_TABLET | Freq: Every evening | ORAL | Status: DC | PRN
  Administered 2017-11-06 – 2017-11-07 (×2): 100 mg via ORAL
  Filled 2017-11-06 (×2): qty 1
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
## 2017-11-06 NOTE — [facility] Group Notes (Signed)
11/06/2017 1PM  Type of Therapy/Topic:  Group Therapy:  Emotion Regulation  Participation Level:  Did Not Attend   Description of Group:   The purpose of this group is to assist patients in learning to regulate negative emotions and experience positive emotions. Patients will be guided to discuss ways in which they have been vulnerable to their negative emotions. These vulnerabilities will be juxtaposed with experiences of positive emotions or situations, and patients will be challenged to use positive emotions to combat negative ones. Special emphasis will be placed on coping with negative emotions in conflict situations, and patients will process healthy conflict resolution skills.  Therapeutic Goals: 1. Patient will identify two positive emotions or experiences to reflect on in order to balance out negative emotions 2. Patient will label two or more emotions that they find the most difficult to experience 3. Patient will demonstrate positive conflict resolution skills through discussion and/or role plays  Summary of Patient Progress: Patient was encouraged and invited to attend group. Patient did not attend group. Social worker will continue to encourage group participation in the future.        Therapeutic Modalities:   Cognitive Behavioral Therapy Feelings Identification Dialectical Behavioral Therapy   Darin Engels, [place] 11/06/2017 1:48 PM

## 2017-11-06 NOTE — Plan of Care (Signed)
SIWA score is at 0 no signs of detox affect, patient is resting quietly in her room without any disturbances contract for safety of self and others and denies any SI/HI at this time. 15 minute safety check is in progress. Progressing Activity: Sleeping patterns will improve 11/06/2017 1940 - Progressing by Clemens Catholic, RN Education: Knowledge of [place] Education information/materials will improve 11/06/2017 1940 - Progressing by Clemens Catholic, RN Coping: Ability to demonstrate self-control will improve 11/06/2017 1940 - Progressing by Clemens Catholic, [place] Behavior/Discharge Planning: Compliance with treatment plan for underlying cause of condition will improve 11/06/2017 1940 - Progressing by Clemens Catholic, RN Safety: Periods of time without injury will increase 11/06/2017 1940 - Progressing by Clemens Catholic, RN Education: Ability to make informed decisions regarding treatment will improve 11/06/2017 1940 - Progressing by Clemens Catholic, RN Coping: Ability to cope will improve 11/06/2017 1940 - Progressing by Clemens Catholic, [place] Behavior/Discharge Planning: Identification of resources available to assist in meeting health care needs will improve 11/06/2017 1940 - Progressing by Clemens Catholic, RN Medication: Compliance with prescribed medication regimen will improve 11/06/2017 1940 - Progressing by Clemens Catholic, RN Self-Concept: Ability to disclose and discuss suicidal ideas will improve 11/06/2017 1940 - Progressing by Clemens Catholic, RN Coping: Ability to interact with others will improve 11/06/2017 1940 - Progressing by Clemens Catholic, RN Clinical Measurements: Will remain free from infection 11/06/2017 1940 - Progressing by Clemens Catholic, RN Diagnostic test results will improve 11/06/2017 1940 - Progressing by Clemens Catholic, RN Nutrition: Adequate nutrition will be maintained 11/06/2017 1940 -  Progressing by Clemens Catholic, RN Coping: Level of anxiety will decrease 11/06/2017 1940 - Progressing by Clemens Catholic, RN Safety: Ability to remain free from injury will improve 11/06/2017 1940 - Progressing by Clemens Catholic, RN Skin Integrity: Risk for impaired skin integrity will decrease 11/06/2017 1940 - Progressing by Clemens Catholic, RN
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
## 2017-11-06 NOTE — Tx Team (Signed)
Interdisciplinary Treatment and Diagnostic Plan Update  11/06/2017 Time of Session: [place] MRN: [id number]  Principal Diagnosis: Major depressive disorder, recurrent severe without psychotic features ([place])  Secondary Diagnoses: Principal Problem:   Major depressive disorder, recurrent severe without psychotic features ([place]) Active Problems:   Alcohol use disorder, severe, dependence ([place])   Tobacco use disorder   Current Medications:  Current Facility-Administered Medications  Medication Dose Route Frequency Provider Last Rate Last Dose  . acetaminophen (TYLENOL) tablet 650 mg  650 mg Oral Q6H PRN Pucilowska, Jolanta B, MD      . alum & mag hydroxide-simeth (MAALOX/MYLANTA) 200-200-20 MG/5ML suspension 30 mL  30 mL Oral Q4H PRN Pucilowska, Jolanta B, MD   30 mL at 11/06/17 0937  . carbamazepine (TEGRETOL) tablet 200 mg  200 mg Oral QID Marylin Crosby, MD   200 mg at 11/06/17 1113  . chlordiazePOXIDE (LIBRIUM) capsule 25 mg  25 mg Oral QID Pucilowska, Jolanta B, MD   25 mg at 11/06/17 1113  . folic acid (FOLVITE) tablet 1 mg  1 mg Oral Daily Pucilowska, Jolanta B, MD   1 mg at 11/06/17 0821  . gabapentin (NEURONTIN) capsule 900 mg  900 mg Oral TID Marylin Crosby, MD   900 mg at 11/06/17 1113  . HYDROcodone-acetaminophen (NORCO/VICODIN) 5-325 MG per tablet 1 tablet  1 tablet Oral Q6H PRN McNew, Tyson Babinski, MD      . Derrill Memo ON 11/07/2017] levothyroxine (SYNTHROID, LEVOTHROID) tablet 75 mcg  75 mcg Oral QAC breakfast McNew, Holly R, MD      . LORazepam (ATIVAN) tablet 0-4 mg  0-4 mg Oral Q4H PRN Marylin Crosby, MD   4 mg at 11/06/17 0045   Followed by  . [START ON 11/08/2017] LORazepam (ATIVAN) tablet 0-4 mg  0-4 mg Oral Q6H PRN McNew, Holly R, MD      . magnesium hydroxide (MILK OF MAGNESIA) suspension 30 mL  30 mL Oral Daily PRN Pucilowska, Jolanta B, MD      . multivitamin with minerals tablet 1 tablet  1 tablet Oral Daily Pucilowska, Jolanta B, MD   1 tablet at 11/06/17 0821  .  QUEtiapine (SEROQUEL) tablet 100 mg  100 mg Oral QHS McNew, Holly R, MD      . thiamine (VITAMIN B-1) tablet 100 mg  100 mg Oral Daily Pucilowska, Jolanta B, MD   100 mg at 11/06/17 9977   Or  . thiamine (B-1) injection 100 mg  100 mg Intravenous Daily Pucilowska, Jolanta B, MD      . traZODone (DESYREL) tablet 100 mg  100 mg Oral QHS PRN McNew, Tyson Babinski, MD       PTA Medications: Medications Prior to Admission  Medication Sig Dispense Refill Last Dose  . calcium citrate (CALCITRATE - DOSED IN MG ELEMENTAL CALCIUM) 950 MG tablet Take 1 tablet (200 mg of elemental calcium total) 2 (two) times daily by mouth. 90 tablet 2   . cholecalciferol 5000 units TABS Take 1 tablet (5,000 Units total) daily by mouth. 30 tablet 3   . cyclobenzaprine (FLEXERIL) 10 MG tablet Take 1 tablet (10 mg total) 3 (three) times daily as needed by mouth for muscle spasms. 60 tablet 1   . docusate sodium (COLACE) 100 MG capsule Take 1 capsule (100 mg total) 2 (two) times daily by mouth. 30 capsule 0   . gabapentin (NEURONTIN) 300 MG capsule Take 1 capsule (300 mg total) 3 (three) times daily by mouth. 90 capsule 0   .  HYDROcodone-acetaminophen (NORCO) 10-325 MG tablet Take 1-2 tablets every 6 (six) hours as needed by mouth for moderate pain or severe pain. 56 tablet 0   . levothyroxine (SYNTHROID, LEVOTHROID) 75 MCG tablet Take 75 mcg daily before breakfast by mouth.   07/17/2017 at Unknown time  . Multiple Vitamin (MULTIVITAMIN WITH MINERALS) TABS tablet Take 1 tablet by mouth daily. Vitamin supplement   07/17/2017 at Unknown time  . Multiple Vitamins-Minerals (MULTIPLE VITAMINS/WOMENS PO) Take 1 tablet by mouth daily.   07/17/2017 at Unknown time  . naphazoline-glycerin (CLEAR EYES) 0.012-0.2 % SOLN Place 1-2 drops into both eyes 4 (four) times daily as needed for irritation.  0 07/17/2017 at Unknown time  . traMADol (ULTRAM) 50 MG tablet Take 1-2 tablets (50-100 mg total) every 12 (twelve) hours as needed by mouth (breakthrough  pain only). 50 tablet 0   . vitamin C (VITAMIN C) 500 MG tablet Take 1 tablet (500 mg total) daily by mouth. 30 tablet 1     Patient Stressors: Financial difficulties Health problems Medication change or noncompliance Substance abuse Traumatic event  Patient Strengths: Ability for insight Average or above average intelligence Capable of independent living  Treatment Modalities: Medication Management, Group therapy, Case management,  1 to 1 session with clinician, Psychoeducation, Recreational therapy.   Physician Treatment Plan for Primary Diagnosis: Major depressive disorder, recurrent severe without psychotic features ([place]) Long Term Goal(s):     Short Term Goals:    Medication Management: Evaluate patient's response, side effects, and tolerance of medication regimen.  Therapeutic Interventions: 1 to 1 sessions, Unit Group sessions and Medication administration.  Evaluation of Outcomes: Progressing  Physician Treatment Plan for Secondary Diagnosis: Principal Problem:   Major depressive disorder, recurrent severe without psychotic features ([place]) Active Problems:   Alcohol use disorder, severe, dependence ([place])   Tobacco use disorder  Long Term Goal(s):     Short Term Goals:       Medication Management: Evaluate patient's response, side effects, and tolerance of medication regimen.  Therapeutic Interventions: 1 to 1 sessions, Unit Group sessions and Medication administration.  Evaluation of Outcomes: Progressing   RN Treatment Plan for Primary Diagnosis: Major depressive disorder, recurrent severe without psychotic features ([employer]) Long Term Goal(s): Knowledge of disease and therapeutic regimen to maintain health will improve  Short Term Goals: Ability to verbalize feelings will improve, Ability to identify and develop effective coping behaviors will improve and Compliance with prescribed medications will improve  Medication Management: RN will administer medications  as ordered by provider, will assess and evaluate patient's response and provide education to patient for prescribed medication. RN will report any adverse and/or side effects to prescribing provider.  Therapeutic Interventions: 1 on 1 counseling sessions, Psychoeducation, Medication administration, Evaluate responses to treatment, Monitor vital signs and CBGs as ordered, Perform/monitor CIWA, COWS, AIMS and Fall Risk screenings as ordered, Perform wound care treatments as ordered.  Evaluation of Outcomes: Progressing   LCSW Treatment Plan for Primary Diagnosis: Major depressive disorder, recurrent severe without psychotic features ([place]) Long Term Goal(s): Safe transition to appropriate next level of care at discharge, Engage patient in therapeutic group addressing interpersonal concerns.  Short Term Goals: Engage patient in aftercare planning with referrals and resources, Facilitate patient progression through stages of change regarding substance use diagnoses and concerns and Increase skills for wellness and recovery  Therapeutic Interventions: Assess for all discharge needs, 1 to 1 time with Social worker, Explore available resources and support systems, Assess for adequacy in community support network, Educate  family and significant other(s) on suicide prevention, Complete Psychosocial Assessment, Interpersonal group therapy.  Evaluation of Outcomes: Progressing   Progress in Treatment: Attending groups: No. Participating in groups: No. Taking medication as prescribed: Yes. Toleration medication: Yes. Family/Significant other contact made: No, will contact:  a support if pt provides consent.  Patient understands diagnosis: Yes. Discussing patient identified problems/goals with staff: Yes. Medical problems stabilized or resolved: Yes. Denies suicidal/homicidal ideation: Yes. Issues/concerns per patient self-inventory: No. Other: None at this time.   New problem(s) identified: No,  Describe:  none at this time.   New Short Term/Long Term Goal(s): Pt reported her goal for treatment is to, "get help."   Discharge Plan or Barriers: CSW will continue to assess for appropriate discharge plan. Pt reported wanting residential tx upon discharge, so CSW will make appropriate referrals.   Reason for Continuation of Hospitalization: Depression Medication stabilization Withdrawal symptoms  Estimated Length of Stay: 3-5 days  Attendees: Patient: Dawn Foley 11/06/2017 11:37 AM  Physician: Dr. Wonda Olds, MD 11/06/2017 11:37 AM  Nursing: Roetta Sessions, RN 11/06/2017 11:37 AM  RN Care Manager: 11/06/2017 11:37 AM  Social Worker: Alden Hipp, LCSW 11/06/2017 11:37 AM  Recreational Therapist:  11/06/2017 11:37 AM  Other: Darin Engels, [place] 11/06/2017 11:37 AM  Other:  11/06/2017 11:37 AM  Other: 11/06/2017 11:37 AM     Scribe for Treatment Team: Alden Hipp, LCSW 11/06/2017 11:40 AM

## 2017-11-06 NOTE — Progress Notes (Signed)
D- Patient alert and oriented. Patient presents in an agitated/irritable mood and tearful on assessment because she is fixated on getting Ativan and upset that she couldn't get this medication with her other morning medications. Patient stated "I'm supposed to be getting 4 mg every 4 hours, I'm not a pill popper ma'am, unfortunately I'm withdrawing from alcohol, I drink half a gallon a day, since I was 22". Patient reports that her depression level is a "10/10" stating "I can't take care of my kids let alone myself". Patient denies SI, HI, AVH, at this time. Patient also states that her anxiety level "it won't be up anymore" after taking her Ativan.  A- Scheduled medications administered to patient, per MD orders. Support and encouragement provided.  Routine safety checks conducted every 15 minutes.  Patient informed to notify staff with problems or concerns.  R- No adverse drug reactions noted. Patient contracts for safety at this time. Patient compliant with medications and treatment plan. Patient receptive, calm, and cooperative. Patient interacts well with others on the unit.  Patient remains safe at this time.
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
## 2017-11-06 NOTE — Plan of Care (Signed)
Patient slept for Estimated Hours of 3; Precautionary checks every 15 minutes for safety maintained, room free of safety hazards, patient sustains no injury or falls during this shift.

## 2017-11-06 NOTE — Progress Notes (Signed)
CSW faxed clinical referral to [facility] for residential alcohol treatment. CSW will follow up with [facility] tomorrow regarding referral.   Alden Hipp, MSW, LCSW 11/06/2017 2:04 PM

## 2017-11-06 NOTE — Progress Notes (Signed)
CSW received a call from admissions at [facility]. CSW was informed that pt was accepted and can be admitted on Friday, 11/08/17. Pt needs to arrive by 10:00AM. CSW explained that pt will be transported via sheriff, so it may not be exactly at 10:30. Amy (from admissions) expressed understanding and agreement with this information.   CSW will inform pt of her acceptance and continue to support her throughout her admission.   Alden Hipp, MSW, LCSW 11/06/2017 3:40 PM
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
## 2017-11-06 NOTE — Progress Notes (Signed)
High acuity patient, 3-staff assist to the bathroom at one point due to difficulty to pivot from [facility] to the toilet; morbid obese and difficult to transfer; fixated on more medications, food and beverages. Will continue to maintain a safe environment.
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
## 2017-11-06 NOTE — BHH Suicide Risk Assessment (Signed)
[place] INPATIENT:  Family/Significant Other Suicide Prevention Education  Suicide Prevention Education:  Patient Refusal for Family/Significant Other Suicide Prevention Education: The patient Dawn Foley has refused to provide written consent for family/significant other to be provided Family/Significant Other Suicide Prevention Education during admission and/or prior to discharge.  Physician notified.  Alden Hipp, LCSW 11/06/2017, 9:07 AM

## 2017-11-06 NOTE — Plan of Care (Signed)
Patient has the ability to function at an adequate level and states that she slept "on and off" last night. Patient verbalizes understanding of the general information that has been provided to her and has not voiced any further questions or concerns at this time. Patient denies any SI/HI/AVH at this time. Patient has been able to demonstrate self-control on the unit and has remained free from injury thus far. Patient has the ability to make informed decisions about her health care. Patient has the ability to cope as well as verbalize positive feelings about herself. Patient has the ability to interact with other members on the unit and has used fair eye contact when communicating with other members on the unit. Patient remains safe on the unit at this time.

## 2017-11-07 MED ORDER — BLISTEX MEDICATED EX OINT
TOPICAL_OINTMENT | CUTANEOUS | Status: DC | PRN
  Administered 2017-11-07: 1 via TOPICAL
  Filled 2017-11-07: qty 6.3

## 2017-11-07 NOTE — BHH Group Notes (Signed)
11/07/2017  Time: 1:00PM  Type of Therapy/Topic:  Group Therapy:  Balance in Life  Participation Level:  Did Not Attend  Description of Group:   This group will address the concept of balance and how it feels and looks when one is unbalanced. Patients will be encouraged to process areas in their lives that are out of balance and identify reasons for remaining unbalanced. Facilitators will guide patients in utilizing problem-solving interventions to address and correct the stressor making their life unbalanced. Understanding and applying boundaries will be explored and addressed for obtaining and maintaining a balanced life. Patients will be encouraged to explore ways to assertively make their unbalanced needs known to significant others in their lives, using other group members and facilitator for support and feedback.  Therapeutic Goals: 1. Patient will identify two or more emotions or situations they have that consume much of in their lives. 2. Patient will identify signs/triggers that life has become out of balance:  3. Patient will identify two ways to set boundaries in order to achieve balance in their lives:  4. Patient will demonstrate ability to communicate their needs through discussion and/or role plays  Summary of Patient Progress: Pt was invited to attend group but chose not to attend. CSW will continue to encourage pt to attend group throughout their admission.     Therapeutic Modalities:   Cognitive Behavioral Therapy Solution-Focused Therapy Assertiveness Training  Alden Hipp, MSW, LCSW 11/07/2017 1:49 PM

## 2017-11-07 NOTE — Progress Notes (Signed)
D: Unable to attend unit programing  tearful , upset  sleeping most of shift  Patient complaining about her withdrawal symptoms .  Unable  to attend unit  programing  this shift  No safety concerns Currently  not working on  coping skills  Medication compliant Denies suicidal ideations  Limited interaction with peers   Patient stated slept good last night .Stated appetite is good and energy level  Is normal. Stated concentration is good . Stated Depression  Appropriate ADL'S. Interacting with peers and staff. Aware of going to [place] tomorrow  A: Encourage patient participation with unit programming . Instruction  Given on  Medication , verbalize understanding. R: Voice no other concerns. Staff continue to monitor

## 2017-11-07 NOTE — BHH Group Notes (Signed)
  11/07/2017 9am  Type of Therapy and Topic: Group Therapy: Goals Group: SMART Goals   Participation Level: Did Not Attend  Description of Group:    The purpose of a daily goals group is to assist and guide patients in setting recovery/wellness-related goals. The objective is to set goals as they relate to the crisis in which they were admitted. Patients will be using SMART goal modalities to set measurable goals. Characteristics of realistic goals will be discussed and patients will be assisted in setting and processing how one will reach their goal. Facilitator will also assist patients in applying interventions and coping skills learned in psycho-education groups to the SMART goal and process how one will achieve defined goal.   Therapeutic Goals:   -Patients will develop and document one goal related to or their crisis in which brought them into treatment.  -Patients will be guided by LCSW using SMART goal setting modality in how to set a measurable, attainable, realistic and time sensitive goal.  -Patients will process barriers in reaching goal.  -Patients will process interventions in how to overcome and successful in reaching goal.   Patient's Goal:Patient was encouraged and invited to attend group. Patient did not attend group. Social worker will continue to encourage group participation in the future.    Therapeutic Modalities:  Motivational Interviewing  Cognitive Behavioral Therapy  Crisis Intervention Model  SMART goals setting  Darin Engels, [place] 11/07/2017 9:36 AM

## 2017-11-07 NOTE — Plan of Care (Signed)
Unable to attend unit programing  tearful , upset  sleeping most of shift  Patient complaining about her withdrawal symptoms .  Unable  to attend unit  programing  this shift  No safety concerns Currently  not working on  coping skills  Medication compliant Denies suicidal ideations  Limited interaction with peers   Not Progressing Spiritual Needs Ability to function at adequate level 11/07/2017 1608 - Not Progressing by Leodis Liverpool, RN Activity: Sleeping patterns will improve 11/07/2017 1608 - Not Progressing by Leodis Liverpool, RN Education: Knowledge of [place] Education information/materials will improve 11/07/2017 1608 - Not Progressing by Leodis Liverpool, RN Emotional status will improve 11/07/2017 1608 - Not Progressing by Leodis Liverpool, RN Coping: Ability to demonstrate self-control will improve 11/07/2017 1608 - Not Progressing by Leodis Liverpool, RN Health Behavior/Discharge Planning: Compliance with treatment plan for underlying cause of condition will improve 11/07/2017 1608 - Not Progressing by Leodis Liverpool, RN Safety: Periods of time without injury will increase 11/07/2017 1608 - Not Progressing by Leodis Liverpool, RN Education: Ability to make informed decisions regarding treatment will improve 11/07/2017 1608 - Not Progressing by Leodis Liverpool, RN Coping: Ability to cope will improve 11/07/2017 1608 - Not Progressing by Leodis Liverpool, RN Health Behavior/Discharge Planning: Identification of resources available to assist in meeting health care needs will improve 11/07/2017 1608 - Not Progressing by Leodis Liverpool, RN Medication: Compliance with prescribed medication regimen will improve 11/07/2017 1608 - Not Progressing by Leodis Liverpool, RN Self-Concept: Ability to disclose and discuss suicidal ideas will improve 11/07/2017 1608 - Not Progressing by Leodis Liverpool, RN Ability to verbalize positive feelings about self will improve 11/07/2017 1608 -  Not Progressing by Leodis Liverpool, RN Coping: Ability to identify and develop effective coping behavior will improve 11/07/2017 1608 - Not Progressing by Leodis Liverpool, RN Ability to interact with others will improve 11/07/2017 1608 - Not Progressing by Leodis Liverpool, RN Participation in decision-making will improve 11/07/2017 1608 - Not Progressing by Leodis Liverpool, RN Ability to use eye contact when communicating with others will improve 11/07/2017 1608 - Not Progressing by Leodis Liverpool, RN

## 2017-11-07 NOTE — Progress Notes (Addendum)
[facility] MD Progress Note  11/07/2017 9:28 AM Dawn Foley  MRN:  [id number] Subjective:  Pt is extremely argumentative this morning about the Ativan. She is very upset that she only got 2 mg of Ativan and not 4 mg. Discussed that she is on CIWA protocol and the dosing depends on how much she gets. She was very upset by this and did not want to answer many other questions. She states, "I want to go then. I'll just go use drugs then." Discussed that she got into [facility] for tomorrow. She states, "I was there and left after 3 days because they stopped the Ativan." She is extremely perseverative on benzos and getting ativan. She states that Librium "does not work for me and I need 4 mg of Ativan more frequently." Discussed that she is on seizure prophylaxis, as well which we are most concerned about. Pt did not endorse SI or thoughts of self harm. She later stated, "I'm not IVCd anymore. I just need to call the judge and tell him I'm not going to harm myself anymore." When asked if she is going to harm herself, she states, "None of your business." Pt is extremely manipulative and extremely perseverative on benzos. She has been denying SI to nursing staff when asked.   Principal Problem: Major depressive disorder, recurrent episode with mixed features ([place]) Diagnosis:   Patient Active Problem List   Diagnosis Date Noted  . Major depressive disorder, recurrent episode with mixed features ([place]) [F33.9] 11/06/2017    Priority: High  . Borderline personality disorder ([place]) [F60.3] 11/06/2017    Priority: High  . Alcohol use disorder, severe, dependence ([place]) [F10.20] 04/07/2016    Priority: High  . Hepatitis C [B19.20]   . GERD (gastroesophageal reflux disease) [K21.9]   . Anxiety [F41.9]   . 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 Elevated LFTs [R94.5] 02/07/2017  . H/O left hemicolectomy [Z90.49] 02/07/2017  . Sinus tachycardia [R00.0]   . History of hepatitis C  [Z86.19]   . Alcohol abuse [F10.10]   . Hypothyroidism [E03.9] 06/02/2016   Total Time spent with patient: 20 minutes  Past Psychiatric History: See H&P  Past Medical History:  Past Medical History:  Diagnosis Date  . Alcoholism ([place])   . Anemia   . Anxiety   . Arthritis   . Asthma    as a child  . Bipolar disorder ([place])   . Cancer ([place])    colon  . Chronic kidney disease   . Complication of anesthesia    woke up during colonoscopy  . Depression   . Elevated liver enzymes   . GERD (gastroesophageal reflux disease)   . H/O alcohol abuse    clean for 1 month as of 07/17/17  . Heart murmur    per [facility] per PT  . Hepatitis    denies  . Hepatitis C   . Hypothyroidism   . Mallory-Weiss tear   . Nicotine dependence   . Schizophrenia ([place])   . Seizures ([place])    seizures - most recent 05/2017, supposed to be on Tegretol but can't afford  . Thrombocytopenia (H[facility])    Overview:  alcoholism  . Tibial plateau fracture, right   . Upper GI bleed     Past Surgical History:  Procedure Laterality Date  . APPENDECTOMY    . DILATION AND CURETTAGE OF UTERUS    . ESOPHAGOGASTRODUODENOSCOPY (EGD) WITH PROPOFOL N/A 09/04/2016   Procedure: ESOPHAGOGASTRODUODENOSCOPY (EGD) WITH PROPOFOL;  Surgeon: Doran Stabler, MD;  Location: [facility] ENDOSCOPY;  Service: Endoscopy;  Laterality: N/A;  . HARDWARE REMOVAL Right 06/18/2017   Procedure: REMOVAL RIGHT EXTERNAL FIXATOR;  Surgeon: Altamese [place], MD;  Location: [facility];  Service: Orthopedics;  Laterality: Right;  . HERNIA REPAIR     umbilical hernia  . KNEE CLOSED REDUCTION Right 06/18/2017   Procedure: CLOSED MANIPULATION UNDER ANESTHESIA RIGHT KNEE;  Surgeon: Altamese [place], MD;  Location: [facility];  Service: Orthopedics;  Laterality: Right;  . ORIF FEMUR FRACTURE Right 07/18/2017   Procedure: REPAIR NONUNION WITH RIA;  Surgeon: Altamese [place], MD;  Location: [place];  Service: Orthopedics;  Laterality: Right;  . ORIF TIBIA PLATEAU      Family History:  Family History  Problem Relation Age of Onset  . Mental illness Other   . Thyroid disease Other   . Alcoholism Brother    Family Psychiatric  History: See H&P Social History:  Social History   Substance and Sexual Activity  Alcohol Use Yes   Comment: Recovering ; last use 06/01/17     Social History   Substance and Sexual Activity  Drug Use No    Social History   Socioeconomic History  . Marital status: Single    Spouse name: None  . Number of children: None  . Years of education: None  . Highest education level: None  Social Needs  . Financial resource strain: None  . Food insecurity - worry: None  . Food insecurity - inability: None  . Transportation needs - medical: None  . Transportation needs - non-medical: None  Occupational History  . None  Tobacco Use  . Smoking status: Current Every Day Smoker    Packs/day: 1.00    Types: Cigarettes  . Smokeless tobacco: Never Used  Substance and Sexual Activity  . Alcohol use: Yes    Comment: Recovering ; last use 06/01/17  . Drug use: No  . Sexual activity: Yes    Birth control/protection: Condom  Other Topics Concern  . None  Social History Narrative  . None   Additional Social History:                         Sleep: Poor, states that she woke up every hour  Appetite:  Good  Current Medications: Current Facility-Administered Medications  Medication Dose Route Frequency Provider Last Rate Last Dose  . acetaminophen (TYLENOL) tablet 650 mg  650 mg Oral Q6H PRN Pucilowska, Jolanta B, MD   650 mg at 11/07/17 0825  . alum & mag hydroxide-simeth (MAALOX/MYLANTA) 200-200-20 MG/5ML suspension 30 mL  30 mL Oral Q4H PRN Pucilowska, Jolanta B, MD   30 mL at 11/06/17 0937  . carbamazepine (TEGRETOL) tablet 200 mg  200 mg Oral BID Marylin Crosby, MD   200 mg at 11/07/17 0825  . chlordiazePOXIDE (LIBRIUM) capsule 25 mg  25 mg Oral TID Marylin Crosby, MD   25 mg at 11/07/17 0825  . folic acid  (FOLVITE) tablet 1 mg  1 mg Oral Daily Pucilowska, Jolanta B, MD   1 mg at 11/07/17 0825  . gabapentin (NEURONTIN) capsule 900 mg  900 mg Oral TID Marylin Crosby, MD   900 mg at 11/07/17 0825  . levothyroxine (SYNTHROID, LEVOTHROID) tablet 75 mcg  75 mcg Oral QAC breakfast Tasheika Kitzmiller, Tyson Babinski, MD   75 mcg at 11/07/17 0825  . LORazepam (ATIVAN) tablet 0-4 mg  0-4 mg Oral Q6H PRN Jihaad Bruschi,  Tyson Babinski, MD   2 mg at 11/07/17 6812   Followed by  . [START ON 11/08/2017] LORazepam (ATIVAN) tablet 0-4 mg  0-4 mg Oral Q6H PRN Louis Gaw R, MD      . magnesium hydroxide (MILK OF MAGNESIA) suspension 30 mL  30 mL Oral Daily PRN Pucilowska, Jolanta B, MD      . multivitamin with minerals tablet 1 tablet  1 tablet Oral Daily Pucilowska, Jolanta B, MD   1 tablet at 11/07/17 0825  . QUEtiapine (SEROQUEL) tablet 100 mg  100 mg Oral QHS [place] Con[place], Tyson Babinski, MD   100 mg at 11/06/17 2041  . thiamine (VITAMIN B-1) tablet 100 mg  100 mg Oral Daily Pucilowska, Jolanta B, MD   100 mg at 11/07/17 0825   Or  . thiamine (B-1) injection 100 mg  100 mg Intravenous Daily Pucilowska, Jolanta B, MD      . traMADol (ULTRAM) tablet 50 mg  50 mg Oral Q6H PRN Persis Graffius, Tyson Babinski, MD      . traZODone (DESYREL) tablet 100 mg  100 mg Oral QHS PRN Orpheus [place], Tyson Babinski, MD   100 mg at 11/06/17 2041    Lab Results:  Results for orders placed or performed during the hospital encounter of 11/05/17 (from the past 48 hour(s))  CBC     Status: Abnormal   Collection Time: 11/06/17  6:38 AM  Result Value Ref Range   WBC 2.3 (L) 3.6 - 11.0 K/uL   RBC 4.06 3.80 - 5.20 MIL/uL   Hemoglobin 12.0 12.0 - 16.0 g/dL   HCT 38.3 35.0 - 47.0 %   MCV 94.5 80.0 - 100.0 fL   MCH 29.7 26.0 - 34.0 pg   MCHC 31.4 (L) 32.0 - 36.0 g/dL   RDW 23.9 (H) 11.5 - 14.5 %   Platelets 90 (L) 150 - 440 K/uL    Comment: Performed at [facility], [address] [place], [place] [address]  Comprehensive metabolic panel     Status: Abnormal   Collection Time: 11/06/17  6:38 AM   Result Value Ref Range   Sodium 136 135 - 145 mmol/L   Potassium 3.9 3.5 - 5.1 mmol/L   Chloride 103 101 - 111 mmol/L   CO2 22 22 - 32 mmol/L   Glucose, Bld 116 (H) 65 - 99 mg/dL   BUN 6 6 - 20 mg/dL   Creatinine, Ser 0.42 (L) 0.44 - 1.00 mg/dL   Calcium 9.5 8.9 - 10.3 mg/dL   Total Protein 7.8 6.5 - 8.1 g/dL   Albumin 3.4 (L) 3.5 - 5.0 g/dL   AST 279 (H) 15 - 41 U/L   ALT 83 (H) 14 - 54 U/L   Alkaline Phosphatase 156 (H) 38 - 126 U/L   Total Bilirubin 2.2 (H) 0.3 - 1.2 mg/dL   GFR calc non Af Amer >60 >60 mL/min   GFR calc Af Amer >60 >60 mL/min    Comment: (NOTE) The eGFR has been calculated using the CKD EPI equation. This calculation has not been validated in all clinical situations. eGFR's persistently <60 mL/min signify possible Chronic Kidney Disease.    Anion gap 11 5 - 15    Comment: Performed at [facility], [address] [place],  [address]  Hemoglobin A1c     Status: Abnormal   Collection Time: 11/06/17  6:38 AM  Result Value Ref Range   Hgb A1c MFr Bld 4.7 (L) 4.8 - 5.6 %  Comment: (NOTE) Pre diabetes:          5.7%-6.4% Diabetes:              >6.4% Glycemic control for   <7.0% adults with diabetes    Mean Plasma Glucose 88.19 mg/dL    Comment: Performed at [facility] [address] [place], [place] [address]  Lipid panel     Status: Abnormal   Collection Time: 11/06/17  6:38 AM  Result Value Ref Range   Cholesterol 332 (H) 0 - 200 mg/dL   Triglycerides 318 (H) <150 mg/dL   HDL 19 (L) >40 mg/dL   Total CHOL/HDL Ratio 17.5 RATIO   VLDL 64 (H) 0 - 40 mg/dL   LDL Cholesterol 249 (H) 0 - 99 mg/dL    Comment:        Total Cholesterol/HDL:CHD Risk Coronary Heart Disease Risk Table                     Men   Women  1/2 Average Risk   3.4   3.3  Average Risk       5.0   4.4  2 X Average Risk   9.6   7.1  3 X Average Risk  23.4   11.0        Use the calculated Patient Ratio above and the CHD Risk Table to determine the  patient's CHD Risk.        ATP III CLASSIFICATION (LDL):  <100     mg/dL   Optimal  100-129  mg/dL   Near or Above                    Optimal  130-159  mg/dL   Borderline  160-189  mg/dL   High  >190     mg/dL   Very High Performed at [facility], [address] [place], [place] [address]   TSH     Status: Abnormal   Collection Time: 11/06/17  6:38 AM  Result Value Ref Range   TSH 32.738 (H) 0.350 - 4.500 uIU/mL    Comment: Performed by a 3rd Generation assay with a functional sensitivity of <=0.01 uIU/mL. Performed at [facility], [address] [place], [place] [address]   T4, free     Status: None   Collection Time: 11/06/17  6:38 AM  Result Value Ref Range   Free T4 0.63 0.61 - 1.12 ng/dL    Comment: (NOTE) Biotin ingestion may interfere with free T4 tests. If the results are inconsistent with the TSH level, previous test results, or the clinical presentation, then consider biotin interference. If needed, order repeat testing after stopping biotin. Performed at [facility], [address] [place], [place] [address]     Blood Alcohol level:  Lab Results  Component Value Date   [facility] <10 07/18/2017   ETH <10 75/44/9201    Metabolic Disorder Labs: Lab Results  Component Value Date   HGBA1C 4.7 (L) 11/06/2017   MPG 88.19 11/06/2017   MPG 111.15 07/18/2017   No results found for: PROLACTIN Lab Results  Component Value Date   CHOL 332 (H) 11/06/2017   TRIG 318 (H) 11/06/2017   HDL 19 (L) 11/06/2017   CHOLHDL 17.5 11/06/2017   VLDL 64 (H) 11/06/2017   LDLCALC 249 (H) 11/06/2017   LDLCALC 77 06/02/2016    Physical Findings: AIMS:  , ,  ,  ,    CIWA:  CIWA-Ar Total: 7 COWS:     Musculoskeletal: Strength & Muscle Tone: within normal limits Gait & Station: normal Patient leans: N/A  Psychiatric Specialty Exam: Physical Exam  ROS  Blood pressure 103/72, pulse (!) 124, temperature 98 F (36.7 C), temperature source Oral,  resp. rate 18, height 5' 7"  (1.702 m), weight 86.2 kg (190 lb), SpO2 95 %.Body mass index is 29.76 kg/m.  General Appearance: Disheveled  Eye Contact:  Minimal  Speech:  Clear and Coherent  Volume:  Normal  Mood:  Irritable  Affect:  Congruent  Thought Process:  Coherent, perseverative on Ativan  Orientation:  Full (Time, Place, and Person)  Thought Content:  Logical  Suicidal Thoughts:  No  Homicidal Thoughts:  No  Memory:  Immediate;   Fair  Judgement:  Impaired  Insight:  Lacking  Psychomotor Activity:  Normal  Concentration:  Concentration: Fair  Recall:  [employer] of Knowledge:  Fair  Language:  Fair  Akathisia:  No      Assets:  Resilience  ADL's:  Intact  Cognition:  WNL  Sleep:  Number of Hours: 3.45     Treatment Plan Summary: 33 yo female who was admitted due to severe alcohol abuse and impulsive behaviors. She is extremely medication seeking (Ativan and Dilaudid) and is very irritable regarding not getting more mg of Ativan. She has been scoring very low on CIWA. Blood pressure has been fine. She is tachycardic but has a history of sinus tachycardia. She is very manipulative with her care and has been denying SI to staff but refuses to answer question with this provider due to not getting Ativan. She states that she is going to leave and go use drugs if we don't give her more Ativan and only wants to go to [facility] if they will give her more Ativan. She has strong traits of borderline personality disorder and is at chronic elevated risk of self harm and self destructive behaviors. She has not been attending any groups and has not been benefiting from hospitalization. She is getting more irritable due to medication seeking behaviors. She was accepted to [facility] to complete detox and to start residential treatment for alcohol use. She was very motivated for this yesterday.   Plan:  Mood disorder -Continue Seroquel 100 mg qhs -Continue Tegretol 200 mg BID  Alcohol  w/d -Scoring low on CIWA -d/c scheduled Librium due to low score on CIWA -Continue prn Ativan -Continue gabapentin 900 mg TID  Hypothyroid -Continue Synthroid  Alcohol use disorder -Will go to [facility] tomorrow  Dispo -Pt reports that she does not have a support system. She will go to [facility] for residential treatment tomorrow. She has not given [place] permission to call any family   Marylin Crosby, MD 11/07/2017, 9:28 AM

## 2017-11-08 MED ORDER — TRAZODONE HCL 100 MG PO TABS: 100.0000 mg | ORAL_TABLET | Freq: Every evening | ORAL | Status: DC | PRN

## 2017-11-08 MED ORDER — GABAPENTIN 300 MG PO CAPS: 900.0000 mg | ORAL_CAPSULE | Freq: Three times a day (TID) | ORAL | Status: DC

## 2017-11-08 MED ORDER — CARBAMAZEPINE 200 MG PO TABS: 200.0000 mg | ORAL_TABLET | Freq: Two times a day (BID) | ORAL | Status: DC

## 2017-11-08 MED ORDER — QUETIAPINE FUMARATE 100 MG PO TABS: 100.0000 mg | ORAL_TABLET | Freq: Every day | ORAL | Status: DC
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
## 2017-11-08 NOTE — Progress Notes (Signed)
D: Patient is aware of  Discharge this shift .Patient denies suicidal /homicidal ideations. Patient received all belongings brought in  A: No Storage medications. Writer reviewed Discharge Summary, Suicide Risk Assessment, and Transitional Record. Aware  Of follow up appointment  With [facility] today . R: Patient left unit with no questions  Or concerns  With taxi to [place]
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
## 2017-11-08 NOTE — Plan of Care (Addendum)
Patient found in bed sleeping upon my arrival. Patient is neither visible nor social this evening. Complain of pain in right leg 8/10, given Tramadol with only slight relief reported. Given Trazodone for sleep, ineffective. Given Ativan 2mg  PO @2200  for CIWA 13. Patient reports relief. Denies SI, HI, AVH. Patient affect is sad and tearful at times. Reports appetite and voiding is adequate. Q 15 minute checks maintained. Will continue to monitor throughout the shift.  Patient slept 6.5 hours. Woke angry and agitated. Patient yelled at staff while obtaining vitals. CIWA 16. Given Ativan 2mg  PO. Will endorse care to oncoming shift.  Not Progressing Spiritual Needs Ability to function at adequate level 11/08/2017 0132 - Not Progressing by Derek Mound, RN Activity: Sleeping patterns will improve 11/08/2017 0132 - Not Progressing by Derek Mound, RN Education: Knowledge of [place] Education information/materials will improve 11/08/2017 0132 - Not Progressing by Derek Mound, RN Emotional status will improve 11/08/2017 0132 - Not Progressing by Derek Mound, RN Coping: Ability to demonstrate self-control will improve 11/08/2017 0132 - Not Progressing by Derek Mound, RN Health Behavior/Discharge Planning: Compliance with treatment plan for underlying cause of condition will improve 11/08/2017 0132 - Not Progressing by Derek Mound, RN Safety: Periods of time without injury will increase 11/08/2017 0132 - Not Progressing by Derek Mound, RN Education: Ability to make informed decisions regarding treatment will improve 11/08/2017 0132 - Not Progressing by Derek Mound, RN Coping: Ability to cope will improve 11/08/2017 0132 - Not Progressing by Derek Mound, RN Health Behavior/Discharge Planning: Identification of resources available to assist in meeting health care needs will improve 11/08/2017 0132 - Not Progressing by Derek Mound, RN Medication: Compliance with  prescribed medication regimen will improve 11/08/2017 0132 - Not Progressing by Derek Mound, RN Self-Concept: Ability to disclose and discuss suicidal ideas will improve 11/08/2017 0132 - Not Progressing by Derek Mound, RN Ability to verbalize positive feelings about self will improve 11/08/2017 0132 - Not Progressing by Derek Mound, RN Coping: Ability to identify and develop effective coping behavior will improve 11/08/2017 0132 - Not Progressing by Derek Mound, RN Ability to interact with others will improve 11/08/2017 0132 - Not Progressing by Derek Mound, RN Participation in decision-making will improve 11/08/2017 0132 - Not Progressing by Derek Mound, RN Ability to use eye contact when communicating with others will improve 11/08/2017 0132 - Not Progressing by Derek Mound, RN

## 2017-11-08 NOTE — Progress Notes (Signed)
  [facility] Adult Case Management Discharge Plan :  Will you be returning to the same living situation after discharge:  No. At discharge, do you have transportation home?: Yes,  [place] Do you have the ability to pay for your medications: Yes,  [facility]  Release of information consent forms completed and in the chart;  Patient's signature needed at discharge.  Patient to Follow up at: Follow-up [facility]. Go on 11/08/2017.   Specialty:  Behavioral Health Why:  You will be discharged to [facility] for substance use treatment upon discharge from [facility].  Contact information: [place] [phone]          Next level of care provider has access to [place] and Suicide Prevention discussed: Yes,  with pt. Pt declined to involve other supports in her tx.   Have you used any form of tobacco in the last 30 days? (Cigarettes, Smokeless Tobacco, Cigars, and/or Pipes): Yes  Has patient been referred to the Quitline?: Patient refused referral  Patient has been referred for addiction treatment: Yes  Alden Hipp, LCSW 11/08/2017, 8:54 AM

## 2017-12-07 DIAGNOSIS — D61818 Other pancytopenia: Secondary | ICD-10-CM

## 2017-12-07 DIAGNOSIS — R45851 Suicidal ideations: Secondary | ICD-10-CM

## 2017-12-07 DIAGNOSIS — F10239 Alcohol dependence with withdrawal, unspecified: Secondary | ICD-10-CM

## 2017-12-07 DIAGNOSIS — A419 Sepsis, unspecified organism: Secondary | ICD-10-CM

## 2017-12-07 DIAGNOSIS — L03317 Cellulitis of buttock: Secondary | ICD-10-CM

## 2017-12-08 DIAGNOSIS — R000 Tachycardia, unspecified: Secondary | ICD-10-CM

## 2017-12-10 DIAGNOSIS — R531 Weakness: Secondary | ICD-10-CM

## 2017-12-12 ENCOUNTER — Encounter: Payer: Self-pay | Admitting: Psychiatry

## 2017-12-12 DIAGNOSIS — F17200 Nicotine dependence, unspecified, uncomplicated: Secondary | ICD-10-CM | POA: Diagnosis present

## 2017-12-13 ENCOUNTER — Inpatient Hospital Stay
Admission: AD | Admit: 2017-12-13 | Discharge: 2017-12-16 | DRG: 885 | Disposition: A | Discharge status: Home / Self Care | Payer: No Typology Code available for payment source | Attending: Psychiatry | Admitting: Psychiatry

## 2017-12-13 ENCOUNTER — Encounter: Payer: Self-pay | Admitting: *Deleted

## 2017-12-13 ENCOUNTER — Other Ambulatory Visit: Payer: Self-pay

## 2017-12-13 DIAGNOSIS — F17210 Nicotine dependence, cigarettes, uncomplicated: Secondary | ICD-10-CM | POA: Diagnosis present

## 2017-12-13 DIAGNOSIS — K709 Alcoholic liver disease, unspecified: Secondary | ICD-10-CM | POA: Diagnosis present

## 2017-12-13 DIAGNOSIS — F1020 Alcohol dependence, uncomplicated: Secondary | ICD-10-CM | POA: Diagnosis present

## 2017-12-13 DIAGNOSIS — N189 Chronic kidney disease, unspecified: Secondary | ICD-10-CM | POA: Diagnosis present

## 2017-12-13 DIAGNOSIS — F10939 Alcohol use, unspecified with withdrawal, unspecified: Secondary | ICD-10-CM

## 2017-12-13 DIAGNOSIS — F332 Major depressive disorder, recurrent severe without psychotic features: Secondary | ICD-10-CM | POA: Diagnosis not present

## 2017-12-13 DIAGNOSIS — G629 Polyneuropathy, unspecified: Secondary | ICD-10-CM | POA: Diagnosis present

## 2017-12-13 DIAGNOSIS — Z888 Allergy status to other drugs, medicaments and biological substances status: Secondary | ICD-10-CM | POA: Diagnosis not present

## 2017-12-13 DIAGNOSIS — F17200 Nicotine dependence, unspecified, uncomplicated: Secondary | ICD-10-CM | POA: Diagnosis present

## 2017-12-13 DIAGNOSIS — Z79899 Other long term (current) drug therapy: Secondary | ICD-10-CM | POA: Diagnosis not present

## 2017-12-13 DIAGNOSIS — Z993 Dependence on wheelchair: Secondary | ICD-10-CM

## 2017-12-13 DIAGNOSIS — F10239 Alcohol dependence with withdrawal, unspecified: Secondary | ICD-10-CM

## 2017-12-13 DIAGNOSIS — R45851 Suicidal ideations: Secondary | ICD-10-CM | POA: Diagnosis present

## 2017-12-13 DIAGNOSIS — F10230 Alcohol dependence with withdrawal, uncomplicated: Secondary | ICD-10-CM | POA: Diagnosis not present

## 2017-12-13 DIAGNOSIS — Z9102 Food additives allergy status: Secondary | ICD-10-CM | POA: Diagnosis not present

## 2017-12-13 DIAGNOSIS — G40909 Epilepsy, unspecified, not intractable, without status epilepticus: Secondary | ICD-10-CM | POA: Diagnosis present

## 2017-12-13 DIAGNOSIS — E785 Hyperlipidemia, unspecified: Secondary | ICD-10-CM | POA: Diagnosis present

## 2017-12-13 DIAGNOSIS — K219 Gastro-esophageal reflux disease without esophagitis: Secondary | ICD-10-CM | POA: Diagnosis present

## 2017-12-13 DIAGNOSIS — B1920 Unspecified viral hepatitis C without hepatic coma: Secondary | ICD-10-CM | POA: Diagnosis present

## 2017-12-13 DIAGNOSIS — Z9181 History of falling: Secondary | ICD-10-CM | POA: Diagnosis not present

## 2017-12-13 DIAGNOSIS — L299 Pruritus, unspecified: Secondary | ICD-10-CM | POA: Diagnosis not present

## 2017-12-13 DIAGNOSIS — G8929 Other chronic pain: Secondary | ICD-10-CM | POA: Diagnosis present

## 2017-12-13 DIAGNOSIS — E039 Hypothyroidism, unspecified: Secondary | ICD-10-CM | POA: Diagnosis present

## 2017-12-13 LAB — URINE DRUG SCREEN, QUALITATIVE (ARMC ONLY)
Amphetamines, Ur Screen: NOT DETECTED
BARBITURATES, UR SCREEN: NOT DETECTED
BENZODIAZEPINE, UR SCRN: POSITIVE — AB
CANNABINOID 50 NG, UR ~~LOC~~: NOT DETECTED
Cocaine Metabolite,Ur ~~LOC~~: NOT DETECTED
MDMA (Ecstasy)Ur Screen: NOT DETECTED
Methadone Scn, Ur: NOT DETECTED
Opiate, Ur Screen: NOT DETECTED
Phencyclidine (PCP) Ur S: NOT DETECTED
TRICYCLIC, UR SCREEN: NOT DETECTED

## 2017-12-13 MED ORDER — CHLORDIAZEPOXIDE HCL 25 MG PO CAPS
50.0000 mg | ORAL_CAPSULE | Freq: Four times a day (QID) | ORAL | Status: DC
  Administered 2017-12-13 (×2): 50 mg via ORAL
  Filled 2017-12-13 (×2): qty 2

## 2017-12-13 MED ORDER — QUETIAPINE FUMARATE 200 MG PO TABS
200.0000 mg | ORAL_TABLET | Freq: Every day | ORAL | Status: DC
  Administered 2017-12-13 – 2017-12-15 (×3): 200 mg via ORAL
  Filled 2017-12-13 (×3): qty 1

## 2017-12-13 MED ORDER — MAGNESIUM HYDROXIDE 400 MG/5ML PO SUSP: 30.0000 mL | Freq: Every day | ORAL | Status: DC | PRN

## 2017-12-13 MED ORDER — ALUM & MAG HYDROXIDE-SIMETH 200-200-20 MG/5ML PO SUSP: 30.0000 mL | ORAL | Status: DC | PRN

## 2017-12-13 MED ORDER — ACETAMINOPHEN 325 MG PO TABS
650.0000 mg | ORAL_TABLET | Freq: Four times a day (QID) | ORAL | Status: DC | PRN
  Administered 2017-12-13: 650 mg via ORAL
  Filled 2017-12-13: qty 2

## 2017-12-13 MED ORDER — CARBAMAZEPINE 200 MG PO TABS
200.0000 mg | ORAL_TABLET | Freq: Two times a day (BID) | ORAL | Status: DC
Start: 2017-12-13 — End: 2017-12-14
  Administered 2017-12-13: 200 mg via ORAL
  Filled 2017-12-13: qty 1

## 2017-12-13 MED ORDER — GABAPENTIN 300 MG PO CAPS
600.0000 mg | ORAL_CAPSULE | Freq: Three times a day (TID) | ORAL | Status: DC
  Administered 2017-12-13 – 2017-12-16 (×10): 600 mg via ORAL
  Filled 2017-12-13 (×10): qty 2

## 2017-12-13 MED ORDER — HYDROXYZINE HCL 25 MG PO TABS
25.0000 mg | ORAL_TABLET | Freq: Three times a day (TID) | ORAL | Status: DC | PRN
  Administered 2017-12-13: 25 mg via ORAL
  Filled 2017-12-13: qty 1

## 2017-12-13 MED ORDER — GABAPENTIN 300 MG PO CAPS
300.0000 mg | ORAL_CAPSULE | Freq: Three times a day (TID) | ORAL | Status: DC
  Administered 2017-12-13: 300 mg via ORAL
  Filled 2017-12-13: qty 1

## 2017-12-13 MED ORDER — NICOTINE 21 MG/24HR TD PT24
21.0000 mg | MEDICATED_PATCH | Freq: Every day | TRANSDERMAL | Status: DC
  Administered 2017-12-14 – 2017-12-16 (×3): 21 mg via TRANSDERMAL
  Filled 2017-12-13 (×3): qty 1

## 2017-12-13 MED ORDER — TRAZODONE HCL 100 MG PO TABS
100.0000 mg | ORAL_TABLET | Freq: Every evening | ORAL | Status: DC | PRN
  Administered 2017-12-13 – 2017-12-15 (×3): 100 mg via ORAL
  Filled 2017-12-13 (×3): qty 1

## 2017-12-13 MED ORDER — IBUPROFEN 600 MG PO TABS
600.0000 mg | ORAL_TABLET | Freq: Four times a day (QID) | ORAL | Status: DC | PRN
  Administered 2017-12-13 – 2017-12-16 (×3): 600 mg via ORAL
  Filled 2017-12-13 (×3): qty 1

## 2017-12-13 MED ORDER — CHLORDIAZEPOXIDE HCL 25 MG PO CAPS: 25.0000 mg | ORAL_CAPSULE | Freq: Four times a day (QID) | ORAL | Status: DC

## 2017-12-13 MED ORDER — LORAZEPAM 2 MG PO TABS: 2.0000 mg | ORAL_TABLET | Freq: Four times a day (QID) | ORAL | Status: DC | PRN

## 2017-12-13 MED ORDER — LEVOTHYROXINE SODIUM 75 MCG PO TABS
75.0000 ug | ORAL_TABLET | Freq: Every day | ORAL | Status: DC
  Administered 2017-12-14 – 2017-12-16 (×3): 75 ug via ORAL
  Filled 2017-12-13 (×3): qty 1

## 2017-12-13 MED ORDER — PANTOPRAZOLE SODIUM 40 MG PO TBEC
40.0000 mg | DELAYED_RELEASE_TABLET | Freq: Every day | ORAL | Status: DC
  Administered 2017-12-13 – 2017-12-16 (×4): 40 mg via ORAL
  Filled 2017-12-13 (×4): qty 1

## 2017-12-13 MED ORDER — QUETIAPINE FUMARATE 100 MG PO TABS: 100.0000 mg | ORAL_TABLET | Freq: Every day | ORAL | Status: DC

## 2017-12-13 NOTE — [facility] Group Notes (Signed)

## 2017-12-13 NOTE — Progress Notes (Signed)
Recreation Therapy Notes  INPATIENT RECREATION THERAPY ASSESSMENT  Patient Details Name: Dawn Foley MRN: [id number] DOB: 1986-03-20 Today's Date: 12/13/2017       Information Obtained From: Patient  Able to Participate in Assessment/Interview: Yes  Patient Presentation: Responsive  Reason for Admission (Per Patient): Suicidal Ideation, Self-injurious Behavior, Impulsive Behavior, Substance Abuse  Patient Stressors: Other (Comment)(Depression)  Coping Skills:   Substance Abuse  Leisure Interests (2+):  (Nothing anymore I use to like sports.)  Frequency of Recreation/Participation:    Awareness of Community Resources:  Yes  Community Resources:     Current Use: No  If no, 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, Physical  Expressed Interest in [place]: No  County of Residence:  [place]  Patient Main Form of Transportation: Car  Patient Strengths:  Nothing  Patient Identified Areas of Improvement:  Stop Drinking  Patient Goal for Hospitalization:  To quit drinking(Patient states she would like treatment but would like to go home and be sober with her family first.)  Current SI (including self-harm):  No  Current HI:  Yes(Patient states she is seeing little dark shadows she can not make out what it is.)  Current AVH: Yes(Patient states she is hearing talking and singing)  Staff Intervention Plan: Group Attendance, Collaborate with Interdisciplinary Treatment Team  Consent to Intern Participation: N/A  Dawn Foley 12/13/2017, 2:44 PM

## 2017-12-13 NOTE — [facility] Suicide Risk Assessment (Signed)
[facility] Admission Suicide Risk Assessment   Nursing information obtained from:    Demographic factors:    Current Mental Status:    Loss Factors:    Historical Factors:    Risk Reduction Factors:     Total Time spent with patient: 1 hour Principal Problem: Alcohol use disorder, severe, dependence ([place]) Diagnosis:   Patient Active Problem List   Diagnosis Date Noted  . Major depressive disorder, recurrent severe without psychotic features ([place]) [F33.2] 12/13/2017  . Tobacco use disorder [F17.200] 12/12/2017  . Major depressive disorder, recurrent episode with mixed features ([place]) [F33.9] 11/06/2017  . Borderline personality disorder ([place]) [F60.3] 11/06/2017  . Hepatitis C [B19.20]   . GERD (gastroesophageal reflux disease) [K21.9]   . Anxiety [F41.9]   . 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 Elevated LFTs [R94.5] 02/07/2017  . H/O left hemicolectomy [Z90.49] 02/07/2017  . Sinus tachycardia [R00.0]   . History of hepatitis C [Z86.19]   . Alcohol abuse [F10.10]   . Hypothyroidism [E03.9] 06/02/2016  . Alcohol use disorder, severe, dependence ([place]) [F10.20] 04/07/2016   Subjective Data: suicidal ideation  Continued Clinical Symptoms:    The "Alcohol Use Disorders Identification Test", Guidelines for Use in Primary Care, Second Edition.  World Pharmacologist [facility]). Score between 0-7:  no or low risk or alcohol related problems. Score between 8-15:  moderate risk of alcohol related problems. Score between 16-19:  high risk of alcohol related problems. Score 20 or above:  warrants further diagnostic evaluation for alcohol dependence and treatment.   CLINICAL FACTORS:   Depression:   Comorbid alcohol abuse/dependence Impulsivity Alcohol/Substance Abuse/Dependencies   Musculoskeletal: Strength & Muscle Tone: abnormal Gait & Station: unsteady, usesd a wheel chair Patient leans: N/A  Psychiatric Specialty Exam: Physical Exam  Nursing  note and vitals reviewed. Psychiatric: Her speech is normal. Her mood appears anxious. Her affect is labile and inappropriate. She is hyperactive. Cognition and memory are impaired. She expresses impulsivity. She exhibits a depressed mood. She expresses suicidal ideation.    Review of Systems  Musculoskeletal: Positive for falls and joint pain.  Neurological: Positive for tremors and weakness.  Psychiatric/Behavioral: Positive for depression, substance abuse and suicidal ideas. The patient is nervous/anxious and has insomnia.   All other systems reviewed and are negative.   Blood pressure 110/75, pulse (!) 113, temperature 97.7 F (36.5 C), temperature source Oral, resp. rate 18, height (P) 5' 4.5" (1.638 m), weight 80.7 kg (178 lb), SpO2 99 %.Body mass index is 30.08 kg/m (pended).  General Appearance: Disheveled  Eye Contact:  Good  Speech:  Clear and Coherent  Volume:  Increased  Mood:  Angry and Anxious  Affect:  Congruent  Thought Process:  Goal Directed and Descriptions of Associations: Intact  Orientation:  Full (Time, Place, and Person)  Thought Content:  WDL  Suicidal Thoughts:  Yes.  with intent/plan  Homicidal Thoughts:  No  Memory:  Immediate;   Poor Recent;   Poor Remote;   Poor  Judgement:  Poor  Insight:  Lacking  Psychomotor Activity:  Decreased  Concentration:  Concentration: Poor and Attention Span: Poor  Recall:  Poor  Fund of Knowledge:  Poor  Language:  Poor  Akathisia:  No  Handed:  Right  AIMS (if indicated):     Assets:  Communication Skills Desire for Improvement Resilience Social Support  ADL's:  Intact  Cognition:  WNL  Sleep:         COGNITIVE FEATURES THAT CONTRIBUTE TO  RISK:  None    SUICIDE RISK:   Moderate:  Frequent suicidal ideation with limited intensity, and duration, some specificity in terms of plans, no associated intent, good self-control, limited dysphoria/symptomatology, some risk factors present, and identifiable protective  factors, including available and accessible social support.  PLAN OF CARE: hospital admission, medication management, substance abuse counseling, discharge planning.  Ms. Lehrman is a 32 year old female with alcoholism and depression admitted for suicidal ideation.  #Mood -continue Seroquel 200 mg nightly  #Alcohol detox -Librium 59 mg QID -Ativan 2 mg PRN -Protonix 40 mg -Neurontin 600 mg TID -refuses rehab  #HYporthyroidism -Synthroid 75ug daily  #Wheelchair bound from surgery -Motrin 600 mg QID  #Disposition -discharge with mother -follow up with [facility]  I certify that inpatient services furnished can reasonably be expected to improve the patient's condition.   Orson Slick, MD 12/13/2017, 3:54 PM

## 2017-12-13 NOTE — Progress Notes (Signed)
CSW attempted to meet with pt to complete her assessment; however, pt was not agreeable to meeting with CSW and declined to answer questions. CSW will reattempt.   Alden Hipp, MSW, LCSW Clinical Social Worker 12/13/2017 1:53 PM
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
## 2017-12-13 NOTE — Tx Team (Signed)
Initial Treatment Plan 12/13/2017 5:34 PM Dawn Foley [id number]    PATIENT STRESSORS: Substance abuse Traumatic event   PATIENT STRENGTHS: Average or above average intelligence Capable of independent living Communication skills Motivation for treatment/growth   PATIENT IDENTIFIED PROBLEMS: "I just need some help"  Detox  Identify triggers for drinking  Outpatient treatment               DISCHARGE CRITERIA:  Improved stabilization in mood, thinking, and/or behavior  PRELIMINARY DISCHARGE PLAN: Attend PHP/IOP Outpatient therapy  PATIENT/FAMILY INVOLVEMENT: This treatment plan has been presented to and reviewed with the patient, Dawn Foley, and/or family member, .  The patient and family have been given the opportunity to ask questions and make suggestions.  Rise Mu, RN 12/13/2017, 5:34 PM
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
## 2017-12-13 NOTE — Plan of Care (Signed)
Pt denies si/hi/ah/vh and pain at this time. Pt c/o feeling shaking no s/sx visible to Probation officer. Pt given Librium as schedule to help with detox. Pt c/o sleep and was given prn trazodone. Pt stated,"she would rather have ativan than librium" I explain to patient librium works better with her detox and has a longer affect than ativan. I explain ativan is prn and can not be given with librium as it might lower her blood pressure. Pt stated, "she did not understand". Writer re-explain to patient. Patient refuse to accept explanation. Pt gate is unsteady and use's wheelchair for aid in ambulatory. Remains on Q15 mins safety rounds. Will cont to monitor pt.  Problem: Education: Goal: Knowledge of [facility] Education information/materials will improve Outcome: Progressing Goal: Emotional status will improve Outcome: Progressing Goal: Mental status will improve Outcome: Progressing Goal: Verbalization of understanding the information provided will improve Outcome: Progressing   Problem: Coping: Goal: Ability to verbalize frustrations and anger appropriately will improve Outcome: Progressing   Problem: Safety: Goal: Periods of time without injury will increase Outcome: Progressing   Problem: Education: Goal: Knowledge of disease or condition will improve Outcome: Progressing Goal: Understanding of discharge needs will improve Outcome: Progressing   Problem: Health Behavior/Discharge Planning: Goal: Ability to identify changes in lifestyle to reduce recurrence of condition will improve Outcome: Progressing Goal: Identification of resources available to assist in meeting health care needs will improve Outcome: Progressing   Problem: Education: Goal: Knowledge of General Education information will improve Outcome: Progressing   Problem: Health Behavior/Discharge Planning: Goal: Ability to manage health-related needs will improve Outcome: Progressing   Problem: Clinical  Measurements: Goal: Ability to maintain clinical measurements within normal limits will improve Outcome: Progressing Goal: Will remain free from infection Outcome: Progressing Goal: Diagnostic test results will improve Outcome: Progressing Goal: Respiratory complications will improve Outcome: Progressing Goal: Cardiovascular complication will be avoided Outcome: Progressing   Problem: Activity: Goal: Risk for activity intolerance will decrease Outcome: Progressing   Problem: Nutrition: Goal: Adequate nutrition will be maintained Outcome: Progressing   Problem: Coping: Goal: Level of anxiety will decrease Outcome: Progressing   Problem: Elimination: Goal: Will not experience complications related to bowel motility Outcome: Progressing Goal: Will not experience complications related to urinary retention Outcome: Progressing   Problem: Pain Managment: Goal: General experience of comfort will improve Outcome: Progressing   Problem: Safety: Goal: Ability to remain free from injury will improve Outcome: Progressing   Problem: Skin Integrity: Goal: Risk for impaired skin integrity will decrease Outcome: Progressing

## 2017-12-14 DIAGNOSIS — F1020 Alcohol dependence, uncomplicated: Secondary | ICD-10-CM

## 2017-12-14 DIAGNOSIS — F10230 Alcohol dependence with withdrawal, uncomplicated: Secondary | ICD-10-CM

## 2017-12-14 DIAGNOSIS — F332 Major depressive disorder, recurrent severe without psychotic features: Principal | ICD-10-CM

## 2017-12-14 LAB — COMPREHENSIVE METABOLIC PANEL
ALK PHOS: 53 U/L (ref 38–126)
ALT: 20 U/L (ref 14–54)
AST: 31 U/L (ref 15–41)
Albumin: 4 g/dL (ref 3.5–5.0)
Anion gap: 7 (ref 5–15)
BILIRUBIN TOTAL: 0.6 mg/dL (ref 0.3–1.2)
BUN: 11 mg/dL (ref 6–20)
CALCIUM: 9.5 mg/dL (ref 8.9–10.3)
CO2: 26 mmol/L (ref 22–32)
Chloride: 104 mmol/L (ref 101–111)
Creatinine, Ser: 0.51 mg/dL (ref 0.44–1.00)
GFR calc Af Amer: >60 mL/min (ref >60)
GFR calc non Af Amer: >60 mL/min (ref >60)
GLUCOSE: 74 mg/dL (ref 65–99)
Potassium: 4.3 mmol/L (ref 3.5–5.1)
Sodium: 137 mmol/L (ref 135–145)
TOTAL PROTEIN: 8 g/dL (ref 6.5–8.1)

## 2017-12-14 LAB — CBC
HEMATOCRIT: 37.4 % (ref 35.0–47.0)
HEMOGLOBIN: 12.1 g/dL (ref 12.0–16.0)
MCH: 31.2 pg (ref 26.0–34.0)
MCHC: 32.3 g/dL (ref 32.0–36.0)
MCV: 96.6 fL (ref 80.0–100.0)
Platelets: 171 10*3/uL (ref 150–440)
RBC: 3.87 MIL/uL (ref 3.80–5.20)
RDW: 17.4 % — ABNORMAL HIGH (ref 11.5–14.5)
WBC: 3.5 10*3/uL — AB (ref 3.6–11.0)

## 2017-12-14 LAB — TSH: TSH: 2.965 u[IU]/mL (ref 0.350–4.500)

## 2017-12-14 MED ORDER — CHLORDIAZEPOXIDE HCL 25 MG PO CAPS
25.0000 mg | ORAL_CAPSULE | Freq: Four times a day (QID) | ORAL | Status: DC
  Administered 2017-12-14 – 2017-12-15 (×5): 25 mg via ORAL
  Filled 2017-12-14 (×5): qty 1

## 2017-12-14 MED ORDER — NAPHAZOLINE-GLYCERIN 0.012-0.2 % OP SOLN
1.0000 [drp] | Freq: Four times a day (QID) | OPHTHALMIC | Status: DC | PRN
  Filled 2017-12-14: qty 15

## 2017-12-14 MED ORDER — NAPHAZOLINE-PHENIRAMINE 0.025-0.3 % OP SOLN
1.0000 [drp] | Freq: Four times a day (QID) | OPHTHALMIC | Status: DC | PRN
  Administered 2017-12-14 – 2017-12-15 (×3): 2 [drp] via OPHTHALMIC
  Filled 2017-12-14: qty 5

## 2017-12-14 MED ORDER — ATORVASTATIN CALCIUM 20 MG PO TABS
10.0000 mg | ORAL_TABLET | Freq: Every day | ORAL | Status: DC
  Administered 2017-12-14 – 2017-12-16 (×3): 10 mg via ORAL
  Filled 2017-12-14 (×3): qty 1

## 2017-12-14 MED ORDER — HYDROXYZINE HCL 50 MG PO TABS
50.0000 mg | ORAL_TABLET | Freq: Three times a day (TID) | ORAL | Status: DC | PRN
  Administered 2017-12-14: 50 mg via ORAL
  Filled 2017-12-14: qty 1

## 2017-12-14 MED ORDER — LORAZEPAM 1 MG PO TABS
1.0000 mg | ORAL_TABLET | Freq: Four times a day (QID) | ORAL | Status: DC | PRN
  Administered 2017-12-15 – 2017-12-16 (×2): 1 mg via ORAL
  Filled 2017-12-14 (×2): qty 1

## 2017-12-14 MED ORDER — LEVETIRACETAM 500 MG PO TABS
500.0000 mg | ORAL_TABLET | Freq: Two times a day (BID) | ORAL | Status: DC
  Administered 2017-12-14 – 2017-12-16 (×6): 500 mg via ORAL
  Filled 2017-12-14 (×6): qty 1

## 2017-12-14 MED ORDER — BLISTEX MEDICATED EX OINT
TOPICAL_OINTMENT | CUTANEOUS | Status: DC | PRN
  Filled 2017-12-14 (×2): qty 6.3
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
## 2017-12-14 NOTE — Progress Notes (Signed)
Pt cont's to med seek Ativan for withdrawal symptoms. Explain to pt she is on Librium for withdrawals. Ativan is for anxiety. Took pt  bp 105/60 did not give ativan due to low bp. Will cont to monitor pt.

## 2017-12-14 NOTE — Plan of Care (Signed)
Patient aware of information   received   verbalize understanding . Emotional and mental issues  improved  Slightly improves with anger . No concerns  around  safety . Patient utilizing  wheel chair  . Knowledge of substance abuse issues.  Stated she was wanting to get into [facility]    Problem: Education: Goal: Knowledge of [facility] Education information/materials will improve Outcome: Progressing Goal: Emotional status will improve Outcome: Progressing Goal: Mental status will improve Outcome: Progressing Goal: Verbalization of understanding the information provided will improve Outcome: Progressing   Problem: Coping: Goal: Ability to verbalize frustrations and anger appropriately will improve Outcome: Progressing   Problem: Safety: Goal: Periods of time without injury will increase Outcome: Progressing   Problem: Education: Goal: Knowledge of disease or condition will improve Outcome: Progressing Goal: Understanding of discharge needs will improve Outcome: Progressing   Problem: Health Behavior/Discharge Planning: Goal: Ability to identify changes in lifestyle to reduce recurrence of condition will improve Outcome: Progressing Goal: Identification of resources available to assist in meeting health care needs will improve Outcome: Progressing

## 2017-12-14 NOTE — Progress Notes (Signed)
D; Emotional and mental issues  improved  Slightly improves with anger . No concerns  around  safety . Patient utilizing  wheel chair  . Knowledge of substance abuse issues.  Stated she was wanting to get into [facility]   Patient stated slept poor last night .Stated appetitefair and energy level  Iow. Stated concentration is good . Stated on Depression scale 2, hopeless  3 and anxiety 10 .( low 0-10 high) Denies suicidal  homicidal ideations  .  No auditory hallucinations  No pain concerns . Appropriate ADL'S. Interacting with peers and staff. Patient aware of information   received   verbalize understanding  A: Encourage patient participation with unit programming . Instruction  Given on  Medication , verbalize understanding. R: Voice no other concerns. Staff continue to monitor
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
## 2017-12-14 NOTE — BHH Group Notes (Signed)
LCSW Group Therapy Note  12/14/2017 1:15pm  Type of Therapy and Topic:  Group Therapy:  Fears and Unhealthy Coping Skills  Participation Level:  Did Not Attend   Description of Group:  The focus of this group was to discuss some of the prevalent fears that patients experience, and to identify the commonalities among group members.  An exercise was used to initiate the discussion, followed by writing on the white board a group-generated list of unhealthy coping and healthy coping techniques to deal with each fear.    Therapeutic Goals: 1. Patient will identify and describe 3 fears they experience 2. Patient will identify one positive coping strategy for each fear they experience 3. Patient will respond empathically to peers statements regarding fears they experience  Summary of Patient Progress:  Pt invited to group but did not attend.     Therapeutic Modalities Cognitive Behavioral Therapy Motivational Interviewing  Dim Meisinger  CUEBAS-COLON, LCSW 12/14/2017 12:51 PM

## 2017-12-14 NOTE — Plan of Care (Signed)
Pt verbalize "she is feeling better but still have dreams about wine and alcohol lying next to her bed, she also stated, "that she can still taste the alcohol  ". Pt asked about a medication to help with dreams and addiction of tasting alcohol. Writer suggested to patient to speak with the doctor in the morning concerning this issue. Pt denies ah/vh/si/hi and pain at this time. Pt express feeling of anxious and unable to sleep. Prn medication given per MAR. Pt gait is unsteady and uses a wheelchair to aid her in her ambulatory. Pt is currently resting in bed with eyes closed. Pt remains on Q 15 mins safety rounds. Will cont to monitor pt.  Problem: Education: Goal: Knowledge of [place] [facility] Education information/materials will improve Outcome: Progressing Goal: Emotional status will improve Outcome: Progressing Goal: Mental status will improve Outcome: Progressing Goal: Verbalization of understanding the information provided will improve Outcome: Progressing   Problem: Coping: Goal: Ability to verbalize frustrations and anger appropriately will improve Outcome: Progressing   Problem: Safety: Goal: Periods of time without injury will increase Outcome: Progressing   Problem: Education: Goal: Knowledge of disease or condition will improve Outcome: Progressing Goal: Understanding of discharge needs will improve Outcome: Progressing   Problem: Health Behavior/Discharge Planning: Goal: Ability to identify changes in lifestyle to reduce recurrence of condition will improve Outcome: Progressing Goal: Identification of resources available to assist in meeting health care needs will improve Outcome: Progressing

## 2017-12-15 MED ORDER — HYDROCERIN EX CREA
TOPICAL_CREAM | Freq: Three times a day (TID) | CUTANEOUS | Status: DC
  Administered 2017-12-16 (×3): via TOPICAL
  Filled 2017-12-15: qty 113

## 2017-12-15 MED ORDER — DIPHENHYDRAMINE HCL 25 MG PO CAPS
50.0000 mg | ORAL_CAPSULE | Freq: Four times a day (QID) | ORAL | Status: DC | PRN
  Administered 2017-12-15 – 2017-12-16 (×3): 50 mg via ORAL
  Filled 2017-12-15 (×3): qty 2

## 2017-12-15 MED ORDER — CHLORDIAZEPOXIDE HCL 25 MG PO CAPS
25.0000 mg | ORAL_CAPSULE | Freq: Three times a day (TID) | ORAL | Status: DC
  Administered 2017-12-15 – 2017-12-16 (×3): 25 mg via ORAL
  Filled 2017-12-15 (×3): qty 1
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
## 2017-12-15 NOTE — Plan of Care (Signed)
Encourage patient to verbalize positive feeling about self to improve mental and emotional state . Compliance with prescribed medication regimen, patient is safe in the unit and contract for safety patient is aware of coping skills, voice no concerns at this time. 15 minute rounding is maintained. Problem: Education: Goal: Knowledge of [place] [facility] Education information/materials will improve Outcome: Progressing Goal: Emotional status will improve Outcome: Progressing Goal: Mental status will improve Outcome: Progressing Goal: Verbalization of understanding the information provided will improve Outcome: Progressing   Problem: Coping: Goal: Ability to verbalize frustrations and anger appropriately will improve Outcome: Progressing   Problem: Safety: Goal: Periods of time without injury will increase Outcome: Progressing   Problem: Education: Goal: Knowledge of disease or condition will improve Outcome: Progressing Goal: Understanding of discharge needs will improve Outcome: Progressing   Problem: Health Behavior/Discharge Planning: Goal: Ability to identify changes in lifestyle to reduce recurrence of condition will improve Outcome: Progressing Goal: Identification of resources available to assist in meeting health care needs will improve Outcome: Progressing
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
## 2017-12-15 NOTE — Progress Notes (Signed)
D- Patient alert and oriented. Patient presents in a pleasant mood on assessment stating that she woke up at 3:30 last night and couldn't get back to sleep. Patient has complaints of right leg pain, in which she did request pain medication from this Probation officer. Patient endorses signs/symptoms of depression/anxiety stating that her depression is a "10/10" because of "everything, I don't know where I'm going and I miss my kids". Patient also rates her anxiety a "10/10" but can not explain to this writer why she is anxious. Patient denies SI, HI, AVH, at this time. Patient's stated goal for today is to "make it to class".  A- Scheduled medications administered to patient, per MD orders. Support and encouragement provided.  Routine safety checks conducted every 15 minutes.  Patient informed to notify staff with problems or concerns.  R- No adverse drug reactions noted. Patient contracts for safety at this time. Patient compliant with medications and treatment plan. Patient receptive, calm, and cooperative. Patient interacts well with others on the unit.  Patient remains safe at this time.

## 2017-12-15 NOTE — Progress Notes (Signed)
Patient came to door as Probation officer was walking out to go to restroom. Writer said, give me a minute I will be right back. Patient became very belligerent over eyedrop, yelling and cursing as staff and officers. Writer stated, "come to medication room and writer will give her eyedrops". Patient then said, "fuck them eyedrops I am done detoxing and I will be out of here today".  Pt became very paranoid stating, "everyone is laughing and talking about me". Writer tried to reassure patient no one is talking and laughing about her. Patient then asked for a grievance form as she feels the staff is against her. Patient stated, "no one listen to me not even the doctors".Pt verbalized, "I tell the doctors, and you what medication I need and can not get what I need for detox or my body".  Writer explained to patient that medication has to be administer as prescribe, you were given your eyedrop, trazodone, and vistaril last night when you complain of insomnia, anxiety,dry eyes. Patient asked, if she wants to speak to nurse supervisor. As supervisor has been made aware of her concerns. Pt stated, no she has wrote it on paper. Writer re-asked patient is she wanted eyedrops again patient stated "NO". Will cont to monitor pt.

## 2017-12-15 NOTE — Progress Notes (Signed)
Benadryl prn was added for itching on buttocks. Did not appear to have cellulitis infection. Recommend eucerin cream as well as skin is dry

## 2017-12-16 MED ORDER — PANTOPRAZOLE SODIUM 40 MG PO TBEC: 40.0000 mg | DELAYED_RELEASE_TABLET | Freq: Every day | ORAL | 1 refills | Status: DC

## 2017-12-16 MED ORDER — QUETIAPINE FUMARATE 200 MG PO TABS: 200.0000 mg | ORAL_TABLET | Freq: Every day | ORAL | 1 refills | Status: DC

## 2017-12-16 MED ORDER — LEVETIRACETAM 500 MG PO TABS: 500.0000 mg | ORAL_TABLET | Freq: Two times a day (BID) | ORAL | 1 refills | Status: DC

## 2017-12-16 MED ORDER — ATORVASTATIN CALCIUM 10 MG PO TABS: 10.0000 mg | ORAL_TABLET | Freq: Every day | ORAL | 1 refills | Status: DC

## 2017-12-16 MED ORDER — LEVOTHYROXINE SODIUM 75 MCG PO TABS: 75.0000 ug | ORAL_TABLET | Freq: Every day | ORAL | 1 refills | Status: DC

## 2017-12-16 MED ORDER — GABAPENTIN 300 MG PO CAPS: 600.0000 mg | ORAL_CAPSULE | Freq: Three times a day (TID) | ORAL | 1 refills | Status: DC

## 2017-12-16 MED ORDER — TRAZODONE HCL 100 MG PO TABS: 100.0000 mg | ORAL_TABLET | Freq: Every evening | ORAL | 1 refills | Status: DC | PRN

## 2017-12-16 NOTE — [facility] Suicide Risk Assessment (Addendum)
[facility] INPATIENT:  Family/Significant Other Suicide Prevention Education  Suicide Prevention Education:  Contact Attempts: Gloris Manchester (boyfriend) at [phone] has been identified by the patient as the family member/significant other with whom the patient will be residing, and identified as the person(s) who will aid the patient in the event of a mental health crisis.  With written consent from the patient, two attempts were made to provide suicide prevention education, prior to and/or following the patient's discharge.  We were unsuccessful in providing suicide prevention education.  A suicide education pamphlet was given to the patient to share with family/significant other.  Date and time of first attempt: 12/16/17 at 8:40AM  Date and time of second attempt: 12/16/17, [place], LCSW 12/16/2017, 9:19 AM

## 2017-12-16 NOTE — Progress Notes (Signed)
Recreation Therapy Notes  Date: 12/16/2017  Time: 9:30 am  Location: Craft Room  Behavioral response: Appropriate  Intervention Topic: Creative Expression  Discussion/Intervention: Group content on today was focused on creative expressions. The group defined creative expressions and ways they use creative expressions. Individual identified other positive ways creative expressions can be used and why it is important to express yourself. Patients participated in the intervention "expressive painting", where they had a chance to creatively express themselves.  Clinical Observations/Feedback:  Patient came to group and described creative expression as something pretty. She identified sports, crafts, singing , and make up as creative expressions.Individual expressed that she uses creative expressions when ever she can. Chastelyn Athens LRT/CTRS         Rowin Bayron 12/16/2017 11:58 AM

## 2017-12-16 NOTE — Tx Team (Addendum)
Interdisciplinary Treatment and Diagnostic Plan Update  12/16/2017 Time of Session: [place] MRN: [id number]  Principal Diagnosis: Major depressive disorder, recurrent severe without psychotic features ([place])  Secondary Diagnoses: Principal Problem:   Major depressive disorder, recurrent severe without psychotic features ([place]) Active Problems:   Alcohol use disorder, severe, dependence ([place])   Hypothyroidism   Alcohol withdrawal ([place])   GERD (gastroesophageal reflux disease)   Tobacco use disorder   Current Medications:  Current Facility-Administered Medications  Medication Dose Route Frequency Provider Last Rate Last Dose  . acetaminophen (TYLENOL) tablet 650 mg  650 mg Oral Q6H PRN Pucilowska, Jolanta B, MD   650 mg at 12/13/17 1334  . alum & mag hydroxide-simeth (MAALOX/MYLANTA) 200-200-20 MG/5ML suspension 30 mL  30 mL Oral Q4H PRN Pucilowska, Jolanta B, MD      . atorvastatin (LIPITOR) tablet 10 mg  10 mg Oral q1800 Chauncey Mann, MD   10 mg at 12/15/17 1803  . diphenhydrAMINE (BENADRYL) capsule 50 mg  50 mg Oral Q6H PRN Chauncey Mann, MD   50 mg at 12/16/17 4098  . gabapentin (NEURONTIN) capsule 600 mg  600 mg Oral TID Pucilowska, Jolanta B, MD   600 mg at 12/16/17 0809  . hydrocerin (EUCERIN) cream   Topical TID Chauncey Mann, MD      . hydrOXYzine (ATARAX/VISTARIL) tablet 50 mg  50 mg Oral TID PRN Chauncey Mann, MD   50 mg at 12/14/17 2118  . ibuprofen (ADVIL,MOTRIN) tablet 600 mg  600 mg Oral Q6H PRN Pucilowska, Jolanta B, MD   600 mg at 12/16/17 0229  . levETIRAcetam (KEPPRA) tablet 500 mg  500 mg Oral BID Chauncey Mann, MD   500 mg at 12/16/17 0809  . levothyroxine (SYNTHROID, LEVOTHROID) tablet 75 mcg  75 mcg Oral QAC breakfast Pucilowska, Jolanta B, MD   75 mcg at 12/16/17 0709  . lip balm (BLISTEX) ointment   Topical PRN Chauncey Mann, MD      . magnesium hydroxide (MILK OF MAGNESIA) suspension 30 mL  30 mL Oral Daily PRN Pucilowska, Jolanta B, MD      .  naphazoline-pheniramine (NAPHCON-A) 0.025-0.3 % ophthalmic solution 1-2 drop  1-2 drop Both Eyes QID PRN Chauncey Mann, MD   2 drop at 12/15/17 0836  . nicotine (NICODERM CQ - dosed in mg/24 hours) patch 21 mg  21 mg Transdermal Q0600 Pucilowska, Jolanta B, MD   21 mg at 12/16/17 0605  . pantoprazole (PROTONIX) EC tablet 40 mg  40 mg Oral Daily Pucilowska, Jolanta B, MD   40 mg at 12/16/17 0809  . QUEtiapine (SEROQUEL) tablet 200 mg  200 mg Oral QHS Pucilowska, Jolanta B, MD   200 mg at 12/15/17 2116  . traZODone (DESYREL) tablet 100 mg  100 mg Oral QHS PRN Pucilowska, Jolanta B, MD   100 mg at 12/15/17 2116   PTA Medications: Medications Prior to Admission  Medication Sig Dispense Refill Last Dose  . calcium citrate (CALCITRATE - DOSED IN MG ELEMENTAL CALCIUM) 950 MG tablet Take 1 tablet (200 mg of elemental calcium total) 2 (two) times daily by mouth. 90 tablet 2   . carbamazepine (TEGRETOL) 200 MG tablet Take 1 tablet (200 mg total) by mouth 2 (two) times daily.     . cholecalciferol 5000 units TABS Take 1 tablet (5,000 Units total) daily by mouth. 30 tablet 3   . docusate sodium (COLACE) 100 MG capsule Take 1 capsule (100 mg total) 2 (  two) times daily by mouth. 30 capsule 0   . gabapentin (NEURONTIN) 300 MG capsule Take 3 capsules (900 mg total) by mouth 3 (three) times daily.     . Multiple Vitamin (MULTIVITAMIN WITH MINERALS) TABS tablet Take 1 tablet by mouth daily. Vitamin supplement   07/17/2017 at Unknown time  . naphazoline-glycerin (CLEAR EYES) 0.012-0.2 % SOLN Place 1-2 drops into both eyes 4 (four) times daily as needed for irritation.  0 07/17/2017 at Unknown time  . QUEtiapine (SEROQUEL) 100 MG tablet Take 1 tablet (100 mg total) by mouth at bedtime.     . vitamin C (VITAMIN C) 500 MG tablet Take 1 tablet (500 mg total) daily by mouth. 30 tablet 1   . [DISCONTINUED] levothyroxine (SYNTHROID, LEVOTHROID) 75 MCG tablet Take 75 mcg daily before breakfast by mouth.   07/17/2017 at Unknown  time  . [DISCONTINUED] traZODone (DESYREL) 100 MG tablet Take 1 tablet (100 mg total) by mouth at bedtime as needed for sleep.       Patient Stressors: Substance abuse Traumatic event  Patient Strengths: Average or above average intelligence Capable of independent living Communication skills Motivation for treatment/growth  Treatment Modalities: Medication Management, Group therapy, Case management,  1 to 1 session with clinician, Psychoeducation, Recreational therapy.   Physician Treatment Plan for Primary Diagnosis: Major depressive disorder, recurrent severe without psychotic features ([place]) Long Term Goal(s): Improvement in symptoms so as ready for discharge Improvement in symptoms so as ready for discharge   Short Term Goals: Ability to identify changes in lifestyle to reduce recurrence of condition will improve Ability to verbalize feelings will improve Ability to disclose and discuss suicidal ideas Ability to demonstrate self-control will improve Ability to identify and develop effective coping behaviors will improve Ability to maintain clinical measurements within normal limits will improve Compliance with prescribed medications will improve Ability to identify triggers associated with substance abuse/mental health issues will improve Ability to identify changes in lifestyle to reduce recurrence of condition will improve Ability to demonstrate self-control will improve Ability to identify triggers associated with substance abuse/mental health issues will improve  Medication Management: Evaluate patient's response, side effects, and tolerance of medication regimen.  Therapeutic Interventions: 1 to 1 sessions, Unit Group sessions and Medication administration.  Evaluation of Outcomes: Adequate for Discharge  Physician Treatment Plan for Secondary Diagnosis: Principal Problem:   Major depressive disorder, recurrent severe without psychotic features ([place]) Active Problems:    Alcohol use disorder, severe, dependence ([place])   Hypothyroidism   Alcohol withdrawal ([place])   GERD (gastroesophageal reflux disease)   Tobacco use disorder  Long Term Goal(s): Improvement in symptoms so as ready for discharge Improvement in symptoms so as ready for discharge   Short Term Goals: Ability to identify changes in lifestyle to reduce recurrence of condition will improve Ability to verbalize feelings will improve Ability to disclose and discuss suicidal ideas Ability to demonstrate self-control will improve Ability to identify and develop effective coping behaviors will improve Ability to maintain clinical measurements within normal limits will improve Compliance with prescribed medications will improve Ability to identify triggers associated with substance abuse/mental health issues will improve Ability to identify changes in lifestyle to reduce recurrence of condition will improve Ability to demonstrate self-control will improve Ability to identify triggers associated with substance abuse/mental health issues will improve     Medication Management: Evaluate patient's response, side effects, and tolerance of medication regimen.  Therapeutic Interventions: 1 to 1 sessions, Unit Group sessions and Medication administration.  Evaluation  of Outcomes: Adequate for Discharge   RN Treatment Plan for Primary Diagnosis: Major depressive disorder, recurrent severe without psychotic features ([place]) Long Term Goal(s): Knowledge of disease and therapeutic regimen to maintain health will improve  Short Term Goals: Ability to remain free from injury will improve, Ability to verbalize feelings will improve and Compliance with prescribed medications will improve  Medication Management: RN will administer medications as ordered by provider, will assess and evaluate patient's response and provide education to patient for prescribed medication. RN will report any adverse and/or side effects to  prescribing provider.  Therapeutic Interventions: 1 on 1 counseling sessions, Psychoeducation, Medication administration, Evaluate responses to treatment, Monitor vital signs and CBGs as ordered, Perform/monitor CIWA, COWS, AIMS and Fall Risk screenings as ordered, Perform wound care treatments as ordered.  Evaluation of Outcomes: Adequate for Discharge   LCSW Treatment Plan for Primary Diagnosis: Major depressive disorder, recurrent severe without psychotic features ([place]) Long Term Goal(s): Safe transition to appropriate next level of care at discharge, Engage patient in therapeutic group addressing interpersonal concerns.  Short Term Goals: Engage patient in aftercare planning with referrals and resources, Increase emotional regulation and Increase skills for wellness and recovery  Therapeutic Interventions: Assess for all discharge needs, 1 to 1 time with Social worker, Explore available resources and support systems, Assess for adequacy in community support network, Educate family and significant other(s) on suicide prevention, Complete Psychosocial Assessment, Interpersonal group therapy.  Evaluation of Outcomes: Adequate for Discharge   Progress in Treatment: Attending groups: Yes. Participating in groups: Yes. Taking medication as prescribed: Yes. Toleration medication: Yes. Family/Significant other contact made: Yes, individual(s) contacted:  attempted x 2 Patient understands diagnosis: Yes. Discussing patient identified problems/goals with staff: Yes. Medical problems stabilized or resolved: Yes. Denies suicidal/homicidal ideation: Yes. Issues/concerns per patient self-inventory: No. Other: None at this time.   New problem(s) identified: No, Describe:  none at this time.   New Short Term/Long Term Goal(s): Pt reported her goal for treatment is to, "detox more and to learn more."   Discharge Plan or Barriers: Pt will discharge home and will continue tx with [facility] in [facility]  upon discharge.   Reason for Continuation of Hospitalization: Other; describe None, pt is being discharged today.  Estimated Length of Stay: 1 day, pt will discharge today.   Recreational Therapy: Patient Stressors: Substance abuse Patient Goal: Patient will identify 3 triggers for substance use within 5 recreation therapy group sessions  Attendees: Patient: Dawn Foley 12/16/2017 11:34 AM  Physician: Dr. Bary Leriche, MD 12/16/2017 11:34 AM  Nursing: Elige Radon, RN 12/16/2017 11:34 AM  RN Care Manager: 12/16/2017 11:34 AM  Social Worker: Alden Hipp, LCSW 12/16/2017 11:34 AM  Recreational Therapist: Roanna Epley, CTRS-LRT 12/16/2017 11:34 AM  Other: Darin Engels, LCSWA 12/16/2017 11:34 AM  Other: Dossie Arbour, LCSW 12/16/2017 11:34 AM  Other: 12/16/2017 11:34 AM    Scribe for Treatment Team: Alden Hipp, LCSW 12/16/2017 11:34 AM

## 2017-12-16 NOTE — [facility] Group Notes (Signed)
[place] Group Notes:  (Nursing/MHT/Case Management/Adjunct)  Date:  12/16/2017  Time:  4:09 PM  Type of Therapy:  Psychoeducational Skills  Participation Level:  Active  Participation Quality:  Appropriate  Affect:  Appropriate  Cognitive:  Appropriate  Insight:  Appropriate  Engagement in Group:  Engaged  Modes of Intervention:  Socialization  Summary of Progress/Problems:  Adela Lank Lemmie Steinhaus 12/16/2017, 4:09 PM

## 2017-12-16 NOTE — Progress Notes (Signed)
  [facility] Adult Case Management Discharge Plan :  Will you be returning to the same living situation after discharge:  No. Dawn Foley will discharge to her mother's home. At discharge, do you have transportation home?: Yes,  pt's mother will transport her home. Do you have the ability to pay for your medications: Yes,  [facility]  Release of information consent forms completed and in the chart;  Patient's signature needed at discharge.  Patient to Follow up at: Follow-up Information    [facility]. Go on 12/16/2017.   Why:  Please go to your hospital follow up appointment on Wednesday, 12/18/17 at 8:30AM. Thank you! Contact information: [address] [place] [address] [phone]           Next level of care provider has access to [place] and Suicide Prevention discussed: Yes,  with pt. Attemtped contact with pt's boyfriend x2.   Have you used any form of tobacco in the last 30 days? (Cigarettes, Smokeless Tobacco, Cigars, and/or Pipes): Yes  Has patient been referred to the Quitline?: Patient refused referral  Patient has been referred for addiction treatment: Pt. refused referral  Alden Hipp, LCSW 12/16/2017, 9:19 AM

## 2017-12-16 NOTE — Progress Notes (Signed)
Recreation Therapy Notes  INPATIENT RECREATION TR PLAN  Patient Details Name: Dawn Foley MRN: [id number] DOB: 05/29/1986 Today's Date: 12/16/2017  Rec Therapy Plan Is patient appropriate for Therapeutic Recreation?: Yes Treatment times per week: at least 3 Estimated Length of Stay: 5-7 days TR Treatment/Interventions: Group participation (Comment)  Discharge Criteria Pt will be discharged from therapy if:: Discharged Treatment plan/goals/alternatives discussed and agreed upon by:: Patient/family  Discharge Summary Short term goals set: Patient will identify 3 triggers for substance use within 5 recreation therapy group sessions Short term goals met: Adequate for discharge Progress toward goals comments: Groups attended Which groups?: Other (Comment)(creative expressions) Reason goals not met: N/A Therapeutic equipment acquired: N/A Reason patient discharged from therapy: Discharge from hospital Pt/family agrees with progress & goals achieved: Yes Date patient discharged from therapy: 12/16/17   Casie Sturgeon 12/16/2017, 12:51 PM
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
## 2017-12-16 NOTE — Progress Notes (Signed)
Patient denies SI/HI, denies A/V hallucinations. Patient verbalizes understanding of discharge instructions, follow up care and prescriptions.7 days meds given to patient Patient given all belongings from  locker. Patient escorted out by staff, transported by family.

## 2017-12-16 NOTE — BHH Suicide Risk Assessment (Signed)
[facility] Discharge Suicide Risk Assessment   Principal Problem: Major depressive disorder, recurrent severe without psychotic features [facility]) Discharge Diagnoses:  Patient Active Problem List   Diagnosis Date Noted  . Major depressive disorder, recurrent severe without psychotic features ([place]) [F33.2] 12/13/2017    Priority: High  . Tobacco use disorder [F17.200] 12/12/2017  . Borderline personality disorder ([place]) [F60.3] 11/06/2017  . Hepatitis C [B19.20]   . GERD (gastroesophageal reflux disease) [K21.9]   . Anxiety [F41.9]   . 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 Elevated LFTs [R94.5] 02/07/2017  . H/O left hemicolectomy [Z90.49] 02/07/2017  . Sinus tachycardia [R00.0]   . History of hepatitis C [Z86.19]   . Alcohol abuse [F10.10]   . Alcohol withdrawal ([place]) [F10.239] 09/03/2016  . Hypothyroidism [E03.9] 06/02/2016  . Alcohol use disorder, severe, dependence ([place]) [F10.20] 04/07/2016    Total Time spent with patient: 15 minutes plus 20 min on care coordination and documentation.  Musculoskeletal: Strength & Muscle Tone: within normal limits Gait & Station: unable to stand Patient leans: N/A  Psychiatric Specialty Exam: Review of Systems  Musculoskeletal: Positive for joint pain.  Neurological: Negative.   Psychiatric/Behavioral: Negative.   All other systems reviewed and are negative.   Blood pressure 110/71, pulse 83, temperature 98.7 F (37.1 C), temperature source Oral, resp. rate 18, height 5' 4.5" (1.638 m), weight 80.7 kg (178 lb), SpO2 100 %.Body mass index is 30.08 kg/m.  General Appearance: Casual  Eye Contact::  Good  Speech:  Clear and Coherent409  Volume:  Normal  Mood:  Euthymic  Affect:  Appropriate  Thought Process:  Goal Directed and Descriptions of Associations: Intact  Orientation:  Full (Time, Place, and Person)  Thought Content:  WDL  Suicidal Thoughts:  No  Homicidal Thoughts:  No  Memory:  Immediate;    Fair Recent;   Fair Remote;   Fair  Judgement:  Poor  Insight:  Shallow  Psychomotor Activity:  Normal  Concentration:  Fair  Recall:  [place]  Language: Fair  Akathisia:  No  Handed:  Right  AIMS (if indicated):     Assets:  Communication Skills Desire for Improvement Housing Resilience Social Support  Sleep:  Number of Hours: 7.15  Cognition: WNL  ADL's:  Intact   Mental Status Per Nursing Assessment::   On Admission:     Demographic Factors:  Caucasian, Low socioeconomic status and Unemployed  Loss Factors: Decline in physical health and Financial problems/change in socioeconomic status  Historical Factors: Prior suicide attempts and Impulsivity  Risk Reduction Factors:   Sense of responsibility to family, Living with another person, especially a relative and Positive social support  Continued Clinical Symptoms:  Depression:   Comorbid alcohol abuse/dependence Impulsivity Alcohol/Substance Abuse/Dependencies Chronic Pain More than one psychiatric diagnosis Unstable or Poor Therapeutic Relationship Previous Psychiatric Diagnoses and Treatments Medical Diagnoses and Treatments/Surgeries  Cognitive Features That Contribute To Risk:  None    Suicide Risk:  Minimal: No identifiable suicidal ideation.  Patients presenting with no risk factors but with morbid ruminations; may be classified as minimal risk based on the severity of the depressive symptoms  Follow-up Information    [facility], [place]. Go on 12/16/2017.   Why:  Please go to your hospital follow up appointment on Wednesday, 12/18/17 at 8:30AM. Thank you! Contact information: [address] [place] [address] [phone]           Plan Of Care/Follow-up recommendations:  Activity:  as tolerated Diet:  low sodium heart healthy Other:  keep follow up appointments  Orson Slick, MD 12/16/2017, 11:21 AM

## 2017-12-17 NOTE — BHH Group Notes (Signed)
LCSW Group Therapy Note   12/16/2017 1:00pm   Type of Therapy and Topic:  Group Therapy:  Overcoming Obstacles   Participation Level:  Active   Description of Group:    In this group patients will be encouraged to explore what they see as obstacles to their own wellness and recovery. They will be guided to discuss their thoughts, feelings, and behaviors related to these obstacles. The group will process together ways to cope with barriers, with attention given to specific choices patients can make. Each patient will be challenged to identify changes they are motivated to make in order to overcome their obstacles. This group will be process-oriented, with patients participating in exploration of their own experiences as well as giving and receiving support and challenge from other group members.   Therapeutic Goals: 1. Patient will identify personal and current obstacles as they relate to admission. 2. Patient will identify barriers that currently interfere with their wellness or overcoming obstacles.  3. Patient will identify feelings, thought process and behaviors related to these barriers. 4. Patient will identify two changes they are willing to make to overcome these obstacles:      Summary of Patient Progress Pt was active in group, however came in late and required some redirection. She was focused on how her family giving her attitude about her drinking is an obstacle for her. She also points out dynamics of her family and how some enable her drinking even though they yell at her and say they don;t like it. CSW talked about dynamics of addiction and need for whole family intervention.  Pt continued to have poor insight in her role in her own addiction.     Therapeutic Modalities:   Cognitive Behavioral Therapy Solution Focused Therapy Motivational Interviewing Relapse Prevention Therapy  August Saucer, LCSW 12/16/2017 11:01 AM

## 2018-02-07 DIAGNOSIS — F10239 Alcohol dependence with withdrawal, unspecified: Secondary | ICD-10-CM

## 2018-02-07 DIAGNOSIS — E039 Hypothyroidism, unspecified: Secondary | ICD-10-CM

## 2018-02-07 DIAGNOSIS — F1010 Alcohol abuse, uncomplicated: Secondary | ICD-10-CM

## 2018-04-22 IMAGING — DX DG KNEE 1-2V PORT*R*
1 series · 2 of 2 positions shown · non-contrast
Comparison: 06/18/2017

CLINICAL DATA: Known tibial and fibular fractures

EXAM:
PORTABLE RIGHT KNEE - 1-2 VIEW

[Series 1: knee · 0.14mm/px · 2 of 2 slices shown]
[im 1/2]
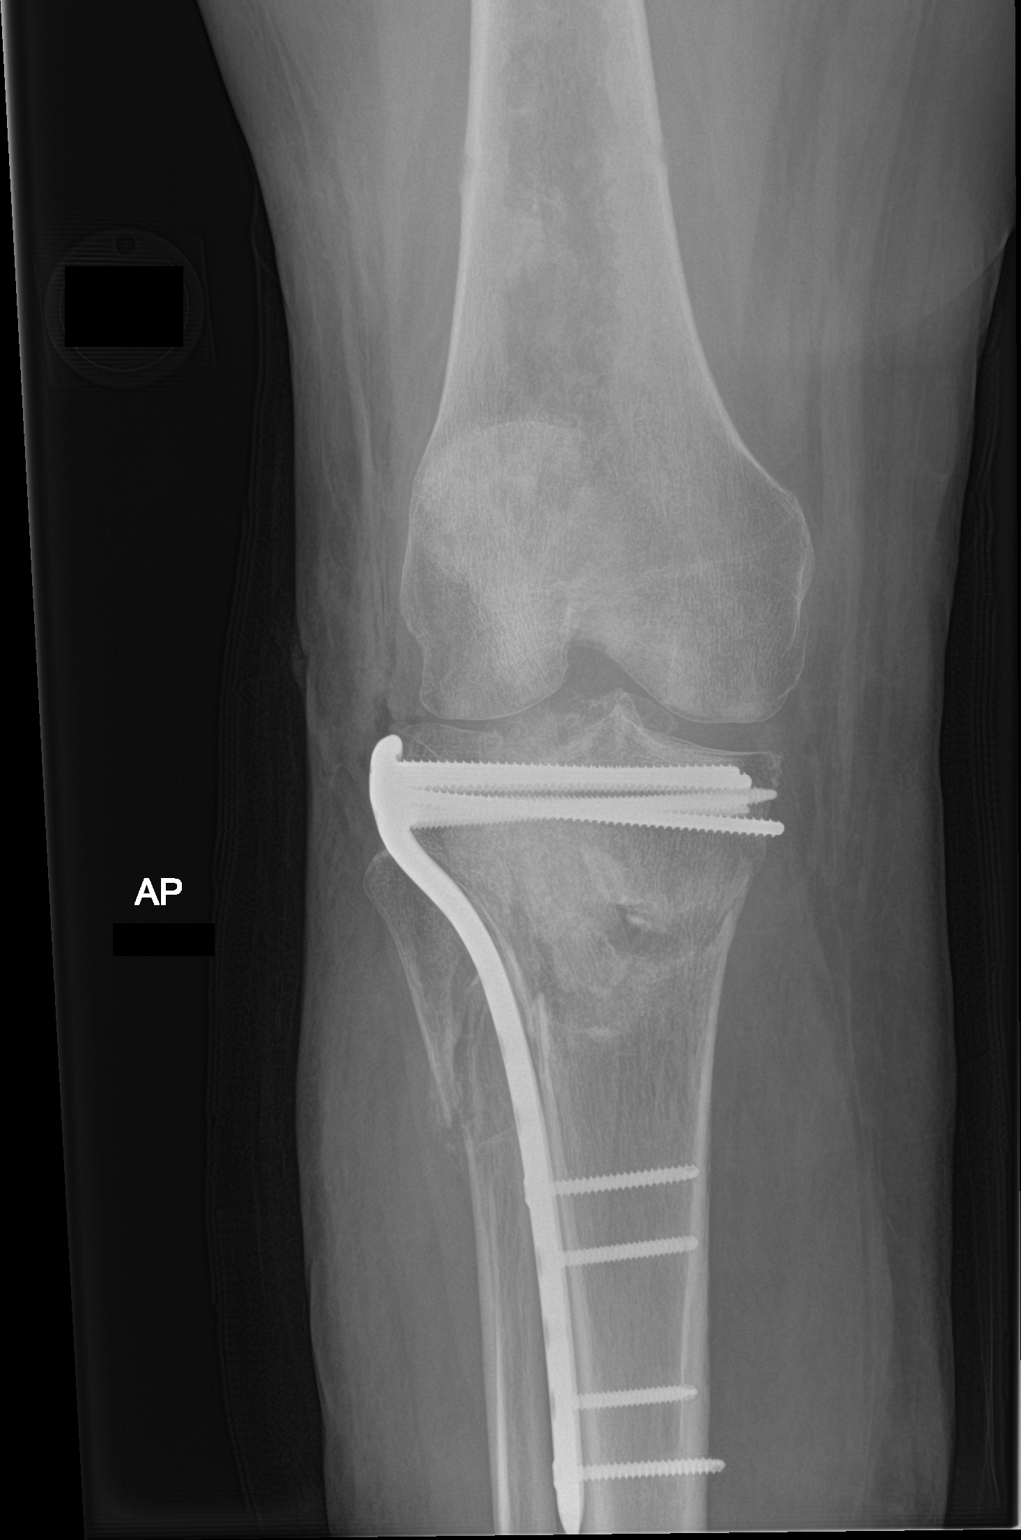
[im 2/2]
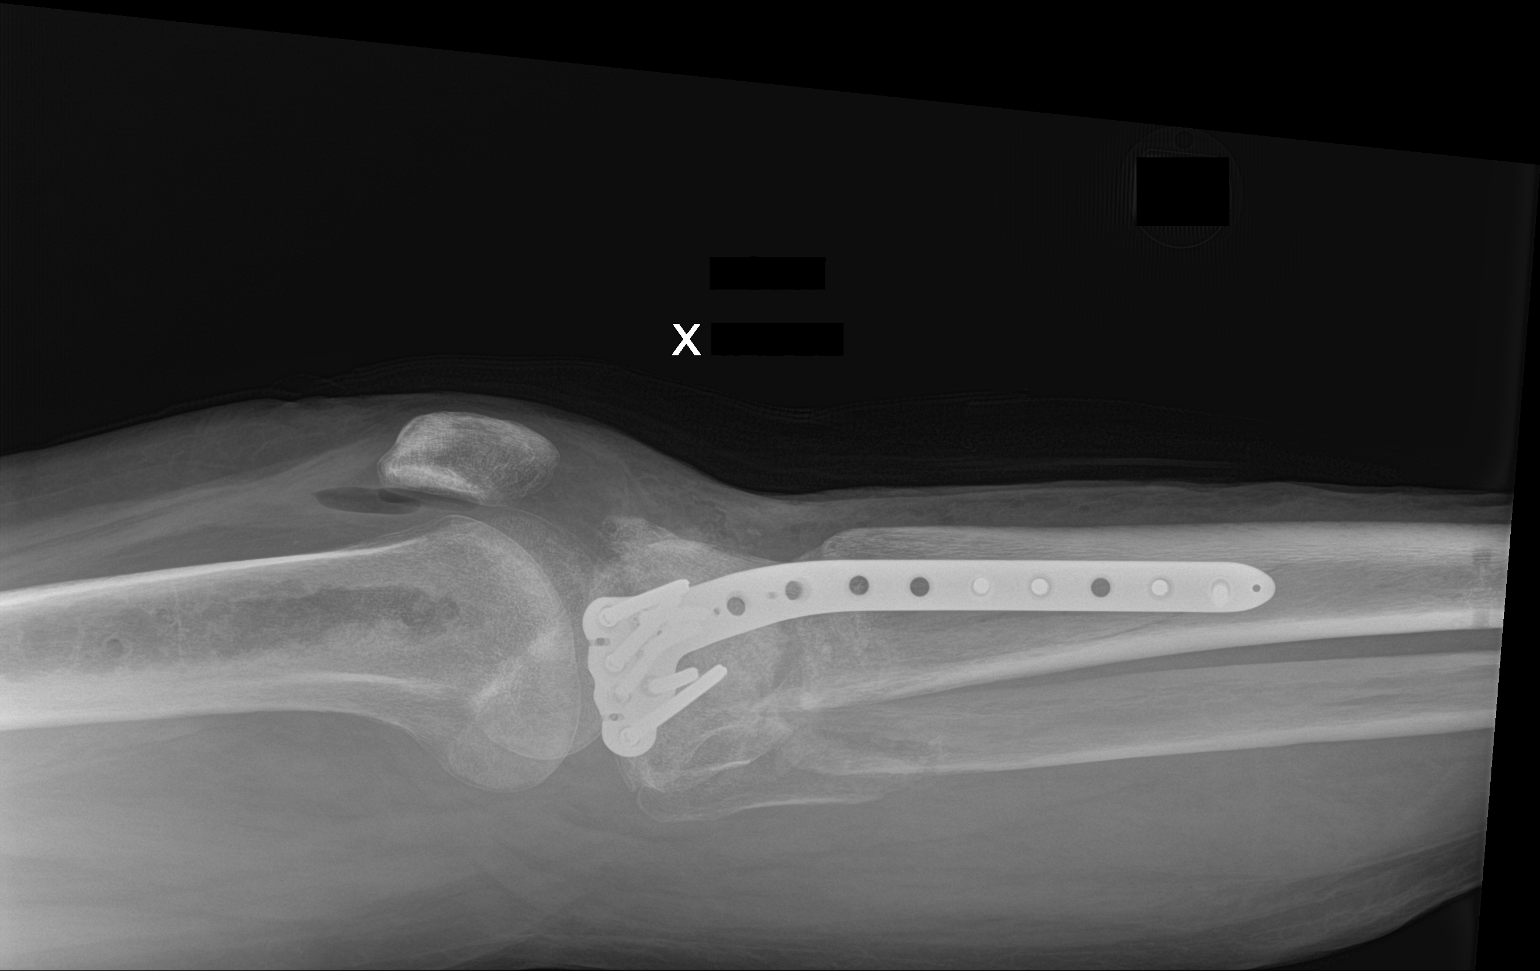

[2 of 2 positions shown; findings below may reference images not displayed]

FINDINGS: Fixation sideplate is noted along the lateral aspect of proximal
tibia. Multiple fixation screws are noted. Fracture fragments are in
improved alignment. A small amount of air is noted within the joint
space related to the surgery. The fibular fractures are stable.
[place] is noted within the distal femur related to bone graft
harvesting.
IMPRESSION: Status post fixation of proximal tibial fractures. Proximal fibular
fracture is again identified with some mild callus formation.

## 2018-06-02 DIAGNOSIS — D696 Thrombocytopenia, unspecified: Secondary | ICD-10-CM

## 2018-06-02 DIAGNOSIS — F10239 Alcohol dependence with withdrawal, unspecified: Secondary | ICD-10-CM

## 2018-06-02 DIAGNOSIS — E039 Hypothyroidism, unspecified: Secondary | ICD-10-CM

## 2018-06-02 DIAGNOSIS — R45851 Suicidal ideations: Secondary | ICD-10-CM

## 2018-06-02 DIAGNOSIS — F329 Major depressive disorder, single episode, unspecified: Secondary | ICD-10-CM

## 2018-06-02 DIAGNOSIS — E871 Hypo-osmolality and hyponatremia: Secondary | ICD-10-CM

## 2018-06-05 ENCOUNTER — Other Ambulatory Visit: Payer: Self-pay

## 2018-06-05 ENCOUNTER — Inpatient Hospital Stay (HOSPITAL_COMMUNITY)
Admission: AD | Admit: 2018-06-05 | Discharge: 2018-06-13 | DRG: 885 | Disposition: A | Discharge status: Home / Self Care | Payer: Federal, State, Local not specified - Other | Source: Other Acute Inpatient Hospital | Attending: Psychiatry | Admitting: Psychiatry

## 2018-06-05 ENCOUNTER — Encounter (HOSPITAL_COMMUNITY): Payer: Self-pay | Admitting: *Deleted

## 2018-06-05 DIAGNOSIS — F1020 Alcohol dependence, uncomplicated: Secondary | ICD-10-CM

## 2018-06-05 DIAGNOSIS — Z885 Allergy status to narcotic agent status: Secondary | ICD-10-CM | POA: Diagnosis not present

## 2018-06-05 DIAGNOSIS — Z811 Family history of alcohol abuse and dependence: Secondary | ICD-10-CM

## 2018-06-05 DIAGNOSIS — F10930 Alcohol use, unspecified with withdrawal, uncomplicated: Secondary | ICD-10-CM

## 2018-06-05 DIAGNOSIS — F332 Major depressive disorder, recurrent severe without psychotic features: Principal | ICD-10-CM | POA: Diagnosis present

## 2018-06-05 DIAGNOSIS — Z888 Allergy status to other drugs, medicaments and biological substances status: Secondary | ICD-10-CM

## 2018-06-05 DIAGNOSIS — F1120 Opioid dependence, uncomplicated: Secondary | ICD-10-CM

## 2018-06-05 DIAGNOSIS — K219 Gastro-esophageal reflux disease without esophagitis: Secondary | ICD-10-CM | POA: Diagnosis present

## 2018-06-05 DIAGNOSIS — E039 Hypothyroidism, unspecified: Secondary | ICD-10-CM | POA: Diagnosis present

## 2018-06-05 DIAGNOSIS — R45851 Suicidal ideations: Secondary | ICD-10-CM | POA: Diagnosis present

## 2018-06-05 DIAGNOSIS — F17210 Nicotine dependence, cigarettes, uncomplicated: Secondary | ICD-10-CM | POA: Diagnosis present

## 2018-06-05 DIAGNOSIS — F603 Borderline personality disorder: Secondary | ICD-10-CM | POA: Diagnosis present

## 2018-06-05 DIAGNOSIS — M1990 Unspecified osteoarthritis, unspecified site: Secondary | ICD-10-CM | POA: Diagnosis present

## 2018-06-05 DIAGNOSIS — G4700 Insomnia, unspecified: Secondary | ICD-10-CM | POA: Diagnosis present

## 2018-06-05 DIAGNOSIS — B379 Candidiasis, unspecified: Secondary | ICD-10-CM | POA: Diagnosis present

## 2018-06-05 DIAGNOSIS — F4310 Post-traumatic stress disorder, unspecified: Secondary | ICD-10-CM | POA: Diagnosis present

## 2018-06-05 DIAGNOSIS — Z79890 Hormone replacement therapy (postmenopausal): Secondary | ICD-10-CM | POA: Diagnosis not present

## 2018-06-05 DIAGNOSIS — F10230 Alcohol dependence with withdrawal, uncomplicated: Secondary | ICD-10-CM

## 2018-06-05 DIAGNOSIS — B1920 Unspecified viral hepatitis C without hepatic coma: Secondary | ICD-10-CM | POA: Diagnosis present

## 2018-06-05 MED ORDER — PROMETHAZINE HCL 25 MG PO TABS
25.0000 mg | ORAL_TABLET | Freq: Once | ORAL | Status: AC
  Administered 2018-06-05: 25 mg via ORAL
  Filled 2018-06-05 (×2): qty 1

## 2018-06-05 MED ORDER — ALUM & MAG HYDROXIDE-SIMETH 200-200-20 MG/5ML PO SUSP
30.0000 mL | ORAL | Status: DC | PRN
Start: 2018-06-05 — End: 2018-06-13

## 2018-06-05 MED ORDER — LORAZEPAM 1 MG PO TABS
1.0000 mg | ORAL_TABLET | Freq: Four times a day (QID) | ORAL | Status: AC
  Administered 2018-06-05 – 2018-06-07 (×6): 1 mg via ORAL
  Filled 2018-06-05 (×7): qty 1

## 2018-06-05 MED ORDER — ACETAMINOPHEN 325 MG PO TABS
650.0000 mg | ORAL_TABLET | Freq: Four times a day (QID) | ORAL | Status: DC | PRN
Start: 2018-06-05 — End: 2018-06-06
  Administered 2018-06-05: 650 mg via ORAL
  Filled 2018-06-05: qty 2

## 2018-06-05 MED ORDER — LORAZEPAM 1 MG PO TABS: 1.0000 mg | ORAL_TABLET | Freq: Every day | ORAL | Status: DC

## 2018-06-05 MED ORDER — LOPERAMIDE HCL 2 MG PO CAPS: 2.0000 mg | ORAL_CAPSULE | ORAL | Status: AC | PRN

## 2018-06-05 MED ORDER — NICOTINE 21 MG/24HR TD PT24
21.0000 mg | MEDICATED_PATCH | Freq: Every day | TRANSDERMAL | Status: DC
  Administered 2018-06-06: 21 mg via TRANSDERMAL
  Filled 2018-06-05 (×3): qty 1

## 2018-06-05 MED ORDER — VITAMIN B-1 100 MG PO TABS
100.0000 mg | ORAL_TABLET | Freq: Every day | ORAL | Status: DC
  Administered 2018-06-06 – 2018-06-13 (×8): 100 mg via ORAL
  Filled 2018-06-05 (×11): qty 1

## 2018-06-05 MED ORDER — QUETIAPINE FUMARATE 200 MG PO TABS
200.0000 mg | ORAL_TABLET | Freq: Once | ORAL | Status: AC
  Administered 2018-06-05: 200 mg via ORAL
  Filled 2018-06-05 (×2): qty 1

## 2018-06-05 MED ORDER — TRAZODONE HCL 50 MG PO TABS
50.0000 mg | ORAL_TABLET | Freq: Every evening | ORAL | Status: DC | PRN
  Administered 2018-06-06: 50 mg via ORAL
  Filled 2018-06-05: qty 1

## 2018-06-05 MED ORDER — LORAZEPAM 1 MG PO TABS
1.0000 mg | ORAL_TABLET | Freq: Two times a day (BID) | ORAL | Status: AC
  Administered 2018-06-08 – 2018-06-09 (×2): 1 mg via ORAL
  Filled 2018-06-05: qty 1

## 2018-06-05 MED ORDER — HYDROXYZINE HCL 25 MG PO TABS: 25.0000 mg | ORAL_TABLET | Freq: Four times a day (QID) | ORAL | Status: DC | PRN

## 2018-06-05 MED ORDER — LORAZEPAM 1 MG PO TABS
1.0000 mg | ORAL_TABLET | Freq: Three times a day (TID) | ORAL | Status: AC
  Administered 2018-06-07 – 2018-06-08 (×3): 1 mg via ORAL
  Filled 2018-06-05 (×3): qty 1

## 2018-06-05 MED ORDER — LORAZEPAM 1 MG PO TABS
1.0000 mg | ORAL_TABLET | Freq: Four times a day (QID) | ORAL | Status: AC | PRN
  Administered 2018-06-07 – 2018-06-08 (×3): 1 mg via ORAL
  Filled 2018-06-05 (×3): qty 1

## 2018-06-05 MED ORDER — MAGNESIUM HYDROXIDE 400 MG/5ML PO SUSP: 30.0000 mL | Freq: Every day | ORAL | Status: DC | PRN
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
## 2018-06-05 NOTE — Tx Team (Signed)
Initial Treatment Plan 06/05/2018 10:42 PM Dawn Foley JTT:[id number]    PATIENT STRESSORS: Financial difficulties Substance abuse Traumatic event   PATIENT STRENGTHS: Average or above average intelligence Communication skills General fund of knowledge Supportive family/friends   PATIENT IDENTIFIED PROBLEMS: "learn how to cope with relapsing"   "learn how to say no even when I'm under pressure"     SI  depression  Substance abuse           DISCHARGE CRITERIA:  Improved stabilization in mood, thinking, and/or behavior Motivation to continue treatment in a less acute level of care  PRELIMINARY DISCHARGE PLAN: Attend aftercare/continuing care group  PATIENT/FAMILY INVOLVEMENT: This treatment plan has been presented to and reviewed with the patient, Dawn Foley.  The patient and family have been given the opportunity to ask questions and make suggestions.  Karie Kirks, [place] 06/05/2018, 10:42 PM

## 2018-06-06 DIAGNOSIS — F17210 Nicotine dependence, cigarettes, uncomplicated: Secondary | ICD-10-CM

## 2018-06-06 DIAGNOSIS — F1120 Opioid dependence, uncomplicated: Secondary | ICD-10-CM

## 2018-06-06 DIAGNOSIS — Z91410 Personal history of adult physical and sexual abuse: Secondary | ICD-10-CM

## 2018-06-06 DIAGNOSIS — F4310 Post-traumatic stress disorder, unspecified: Secondary | ICD-10-CM

## 2018-06-06 DIAGNOSIS — F1024 Alcohol dependence with alcohol-induced mood disorder: Secondary | ICD-10-CM

## 2018-06-06 DIAGNOSIS — F10930 Alcohol use, unspecified with withdrawal, uncomplicated: Secondary | ICD-10-CM

## 2018-06-06 DIAGNOSIS — F10230 Alcohol dependence with withdrawal, uncomplicated: Secondary | ICD-10-CM

## 2018-06-06 LAB — RAPID HIV SCREEN (HIV 1/2 AB+AG)
HIV 1/2 ANTIBODIES: NONREACTIVE
HIV-1 P24 Antigen - HIV24: NONREACTIVE

## 2018-06-06 MED ORDER — LEVETIRACETAM 500 MG PO TABS
500.0000 mg | ORAL_TABLET | Freq: Two times a day (BID) | ORAL | Status: DC
  Administered 2018-06-06 – 2018-06-13 (×15): 500 mg via ORAL
  Filled 2018-06-06 (×2): qty 1
  Filled 2018-06-06: qty 14
  Filled 2018-06-06 (×5): qty 1
  Filled 2018-06-06: qty 14
  Filled 2018-06-06 (×13): qty 1

## 2018-06-06 MED ORDER — GABAPENTIN 600 MG PO TABS
300.0000 mg | ORAL_TABLET | Freq: Three times a day (TID) | ORAL | Status: DC
  Filled 2018-06-06: qty 0.5

## 2018-06-06 MED ORDER — LEVOTHYROXINE SODIUM 75 MCG PO TABS
75.0000 ug | ORAL_TABLET | Freq: Every day | ORAL | Status: DC
  Administered 2018-06-06 – 2018-06-13 (×8): 75 ug via ORAL
  Filled 2018-06-06 (×5): qty 1
  Filled 2018-06-06: qty 7
  Filled 2018-06-06 (×5): qty 1

## 2018-06-06 MED ORDER — PANTOPRAZOLE SODIUM 40 MG PO TBEC: 40.0000 mg | DELAYED_RELEASE_TABLET | Freq: Every day | ORAL | Status: DC

## 2018-06-06 MED ORDER — PROMETHAZINE HCL 25 MG PO TABS
25.0000 mg | ORAL_TABLET | Freq: Four times a day (QID) | ORAL | Status: DC | PRN
  Administered 2018-06-06 – 2018-06-10 (×9): 25 mg via ORAL
  Filled 2018-06-06 (×9): qty 1

## 2018-06-06 MED ORDER — IBUPROFEN 600 MG PO TABS
600.0000 mg | ORAL_TABLET | Freq: Four times a day (QID) | ORAL | Status: DC | PRN
  Administered 2018-06-06 – 2018-06-13 (×11): 600 mg via ORAL
  Filled 2018-06-06 (×11): qty 1

## 2018-06-06 MED ORDER — FOLIC ACID 1 MG PO TABS
1.0000 mg | ORAL_TABLET | Freq: Every day | ORAL | Status: DC
  Administered 2018-06-06 – 2018-06-13 (×8): 1 mg via ORAL
  Filled 2018-06-06 (×11): qty 1

## 2018-06-06 MED ORDER — DOLUTEGRAVIR SODIUM 50 MG PO TABS
50.0000 mg | ORAL_TABLET | Freq: Every day | ORAL | Status: DC
  Administered 2018-06-06 – 2018-06-13 (×8): 50 mg via ORAL
  Filled 2018-06-06 (×10): qty 1
  Filled 2018-06-06: qty 3

## 2018-06-06 MED ORDER — LEVOTHYROXINE SODIUM 75 MCG PO TABS: 75.0000 ug | ORAL_TABLET | Freq: Every day | ORAL | Status: DC

## 2018-06-06 MED ORDER — NAPHAZOLINE-GLYCERIN 0.012-0.2 % OP SOLN
1.0000 [drp] | Freq: Four times a day (QID) | OPHTHALMIC | Status: DC | PRN
  Administered 2018-06-06 – 2018-06-07 (×2): 2 [drp] via OPHTHALMIC
  Administered 2018-06-07: 1 [drp] via OPHTHALMIC
  Administered 2018-06-08: 2 [drp] via OPHTHALMIC
  Filled 2018-06-06: qty 15

## 2018-06-06 MED ORDER — QUETIAPINE FUMARATE 200 MG PO TABS
200.0000 mg | ORAL_TABLET | Freq: Every day | ORAL | Status: DC
  Administered 2018-06-06: 200 mg via ORAL
  Filled 2018-06-06 (×2): qty 1

## 2018-06-06 MED ORDER — EMTRICITABINE-TENOFOVIR AF 200-25 MG PO TABS
1.0000 | ORAL_TABLET | Freq: Every day | ORAL | Status: DC
  Administered 2018-06-06 – 2018-06-13 (×8): 1 via ORAL
  Filled 2018-06-06 (×4): qty 1
  Filled 2018-06-06: qty 3
  Filled 2018-06-06 (×5): qty 1

## 2018-06-06 MED ORDER — PANTOPRAZOLE SODIUM 40 MG PO TBEC
40.0000 mg | DELAYED_RELEASE_TABLET | Freq: Every day | ORAL | Status: DC
  Administered 2018-06-06 – 2018-06-13 (×8): 40 mg via ORAL
  Filled 2018-06-06 (×9): qty 1
  Filled 2018-06-06: qty 7
  Filled 2018-06-06: qty 1

## 2018-06-06 MED ORDER — EMTRICITABINE-TENOFOVIR AF 200-25 MG PO TABS
1.0000 | ORAL_TABLET | Freq: Every day | ORAL | Status: DC
  Filled 2018-06-06 (×2): qty 1

## 2018-06-06 MED ORDER — GABAPENTIN 300 MG PO CAPS
300.0000 mg | ORAL_CAPSULE | Freq: Three times a day (TID) | ORAL | Status: DC
  Administered 2018-06-06 – 2018-06-07 (×3): 300 mg via ORAL
  Filled 2018-06-06 (×8): qty 1
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
## 2018-06-06 NOTE — [facility] Group Notes (Signed)
Adult Psychoeducational Group Note  Date:  06/06/2018 Time:  10:46 AM  Group Topic/Focus:  Healthy Communication:   The focus of this group is to discuss communication, barriers to communication, as well as healthy ways to communicate with others.  Participation Level:  Active  Participation Quality:  Appropriate  Affect:  Appropriate  Cognitive:  Appropriate  Insight: Appropriate  Engagement in Group:  Improving  Modes of Intervention:  Clarification  Additional Comments:  Pt. Discussed lack of supports and needing to attend a rehab.   Dub Mikes 06/06/2018, 10:46 AM
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
## 2018-06-06 NOTE — Progress Notes (Signed)
[place] from [facility] lab called. Rapid HIV is negative.

## 2018-06-06 NOTE — BHH Group Notes (Signed)
Pt attended [place] meeting on [address] hall.

## 2018-06-06 NOTE — BHH Group Notes (Signed)
  [facility] LCSW Group Therapy Note  Date/Time: 06/06/18, 1315  Type of Therapy/Topic:  Group Therapy:  Emotion Regulation  Participation Level:  Active   Mood:pleasant  Description of Group:    The purpose of this group is to assist patients in learning to regulate negative emotions and experience positive emotions. Patients will be guided to discuss ways in which they have been vulnerable to their negative emotions. These vulnerabilities will be juxtaposed with experiences of positive emotions or situations, and patients challenged to use positive emotions to combat negative ones. Special emphasis will be placed on coping with negative emotions in conflict situations, and patients will process healthy conflict resolution skills.  Therapeutic Goals: 1. Patient will identify two positive emotions or experiences to reflect on in order to balance out negative emotions:  2. Patient will label two or more emotions that they find the most difficult to experience:  3. Patient will be able to demonstrate positive conflict resolution skills through discussion or role plays:   Summary of Patient Progress: Pt identified shame and disappointment in self as emotions that are difficult to experience.  Pt active during group discussion regarding positive ways to manage difficult emotions.       Therapeutic Modalities:   Cognitive Behavioral Therapy Feelings Identification Dialectical Behavioral Therapy  Lurline Idol, LCSW

## 2018-06-06 NOTE — Plan of Care (Signed)
Problem: Safety: Goal: Periods of time without injury will increase Outcome: Progressing DAR Note: Pt visible in hall at intervals during shift. Presents anxious / fidgety on interactions. Pt has been somatic and with multiple demands for medications "I need my medicines, I'm a terrible alcoholic". Compliant with medications. Denies adverse drug reactions. Endorsed passive SI without plan. Verbally contracts for safety. Attended morning group but not afternoon group despite multiple prompts. Tolerates all PO intake well. Scheduled and PRN medications given as ordered with verbal education. Support and encouragement offered throughout this shift. Safety checks maintained without self harm gestures or outburst to note thus far.  POC continues for safety and mood stability.

## 2018-06-06 NOTE — Tx Team (Addendum)
Interdisciplinary Treatment and Diagnostic Plan Update  06/06/2018 Time of Session: [name] MRN: [id number]  Principal Diagnosis: <principal problem not specified>  Secondary Diagnoses: Active Problems:   Alcohol dependence with alcohol-induced mood disorder (HCC)   Severe recurrent major depression without psychotic features ([place])   Uncomplicated alcohol withdrawal without perceptual disturbances (HCC)   Posttraumatic stress disorder   Opioid use disorder, moderate, dependence (HCC)   Current Medications:  Current Facility-Administered Medications  Medication Dose Route Frequency Provider Last Rate Last Dose  . alum & mag hydroxide-simeth (MAALOX/MYLANTA) 200-200-20 MG/5ML suspension 30 mL  30 mL Oral Q4H PRN Lindon Romp A, NP      . dolutegravir (TIVICAY) tablet 50 mg  50 mg Oral Daily Sharma Covert, MD   50 mg at 06/06/18 1307  . emtricitabine-tenofovir AF (DESCOVY) 200-25 MG per tablet 1 tablet  1 tablet Oral Daily Sharma Covert, MD   1 tablet at 06/06/18 1222  . folic acid (FOLVITE) tablet 1 mg  1 mg Oral Daily Sharma Covert, MD   1 mg at 06/06/18 1100  . gabapentin (NEURONTIN) capsule 300 mg  300 mg Oral TID Sharma Covert, MD   300 mg at 06/06/18 1107  . hydrOXYzine (ATARAX/VISTARIL) tablet 25 mg  25 mg Oral Q6H PRN Lindon Romp A, NP      . ibuprofen (ADVIL,MOTRIN) tablet 600 mg  600 mg Oral Q6H PRN Sharma Covert, MD      . levETIRAcetam (KEPPRA) tablet 500 mg  500 mg Oral BID Sharma Covert, MD   500 mg at 06/06/18 0955  . levothyroxine (SYNTHROID, LEVOTHROID) tablet 75 mcg  75 mcg Oral QAC breakfast Lindon Romp A, NP   75 mcg at 06/06/18 0601  . loperamide (IMODIUM) capsule 2-4 mg  2-4 mg Oral PRN Lindon Romp A, NP      . LORazepam (ATIVAN) tablet 1 mg  1 mg Oral Q6H PRN Lindon Romp A, NP      . LORazepam (ATIVAN) tablet 1 mg  1 mg Oral QID Lindon Romp A, NP   1 mg at 06/06/18 1107   Followed by  . [START ON 06/07/2018] LORazepam  (ATIVAN) tablet 1 mg  1 mg Oral TID Rozetta Nunnery, NP       Followed by  . [START ON 06/08/2018] LORazepam (ATIVAN) tablet 1 mg  1 mg Oral BID Rozetta Nunnery, NP       Followed by  . [START ON 06/10/2018] LORazepam (ATIVAN) tablet 1 mg  1 mg Oral Daily Lindon Romp A, NP      . magnesium hydroxide (MILK OF MAGNESIA) suspension 30 mL  30 mL Oral Daily PRN Lindon Romp A, NP      . naphazoline-glycerin (CLEAR EYES REDNESS) ophth solution 1-2 drop  1-2 drop Both Eyes QID PRN Sharma Covert, MD   2 drop at 06/06/18 1107  . nicotine (NICODERM CQ - dosed in mg/24 hours) patch 21 mg  21 mg Transdermal Daily Sharma Covert, MD   21 mg at 06/06/18 0806  . pantoprazole (PROTONIX) EC tablet 40 mg  40 mg Oral Daily Lindon Romp A, NP   40 mg at 06/06/18 0601  . promethazine (PHENERGAN) tablet 25 mg  25 mg Oral Q6H PRN Sharma Covert, MD   25 mg at 06/06/18 1305  . QUEtiapine (SEROQUEL) tablet 200 mg  200 mg Oral QHS Sharma Covert, MD      . thiamine (VITAMIN  B-1) tablet 100 mg  100 mg Oral Daily Lindon Romp A, NP   100 mg at 06/06/18 0806  . traZODone (DESYREL) tablet 50 mg  50 mg Oral QHS PRN Rozetta Nunnery, NP       PTA Medications: Medications Prior to Admission  Medication Sig Dispense Refill Last Dose  . atorvastatin (LIPITOR) 10 MG tablet Take 1 tablet (10 mg total) by mouth daily at 6 PM. 30 tablet 1   . calcium citrate (CALCITRATE - DOSED IN MG ELEMENTAL CALCIUM) 950 MG tablet Take 1 tablet (200 mg of elemental calcium total) 2 (two) times daily by mouth. 90 tablet 2   . cholecalciferol 5000 units TABS Take 1 tablet (5,000 Units total) daily by mouth. 30 tablet 3   . gabapentin (NEURONTIN) 300 MG capsule Take 2 capsules (600 mg total) by mouth 3 (three) times daily. 90 capsule 1   . levETIRAcetam (KEPPRA) 500 MG tablet Take 1 tablet (500 mg total) by mouth 2 (two) times daily. 60 tablet 1   . levothyroxine (SYNTHROID, LEVOTHROID) 75 MCG tablet Take 1 tablet (75 mcg total) by mouth  daily before breakfast. 30 tablet 1   . Multiple Vitamin (MULTIVITAMIN WITH MINERALS) TABS tablet Take 1 tablet by mouth daily. Vitamin supplement   07/17/2017 at Unknown time  . naphazoline-glycerin (CLEAR EYES) 0.012-0.2 % SOLN Place 1-2 drops into both eyes 4 (four) times daily as needed for irritation.  0 07/17/2017 at Unknown time  . pantoprazole (PROTONIX) 40 MG tablet Take 1 tablet (40 mg total) by mouth daily. 30 tablet 1   . QUEtiapine (SEROQUEL) 200 MG tablet Take 1 tablet (200 mg total) by mouth at bedtime. 30 tablet 1   . traZODone (DESYREL) 100 MG tablet Take 1 tablet (100 mg total) by mouth at bedtime as needed for sleep. 30 tablet 1   . vitamin C (VITAMIN C) 500 MG tablet Take 1 tablet (500 mg total) daily by mouth. 30 tablet 1     Patient Stressors: Financial difficulties Substance abuse Traumatic event  Patient Strengths: Average or above average intelligence Communication skills General fund of knowledge Supportive family/friends  Treatment Modalities: Medication Management, Group therapy, Case management,  1 to 1 session with clinician, Psychoeducation, Recreational therapy.   Physician Treatment Plan for Primary Diagnosis: <principal problem not specified> Long Term Goal(s): Improvement in symptoms so as ready for discharge Improvement in symptoms so as ready for discharge   Short Term Goals: Ability to identify changes in lifestyle to reduce recurrence of condition will improve Ability to verbalize feelings will improve Ability to disclose and discuss suicidal ideas Ability to demonstrate self-control will improve Ability to identify and develop effective coping behaviors will improve Ability to maintain clinical measurements within normal limits will improve Compliance with prescribed medications will improve Ability to identify triggers associated with substance abuse/mental health issues will improve Ability to identify changes in lifestyle to reduce recurrence  of condition will improve Ability to verbalize feelings will improve Ability to disclose and discuss suicidal ideas Ability to demonstrate self-control will improve Ability to identify and develop effective coping behaviors will improve Ability to maintain clinical measurements within normal limits will improve Compliance with prescribed medications will improve Ability to identify triggers associated with substance abuse/mental health issues will improve  Medication Management: Evaluate patient's response, side effects, and tolerance of medication regimen.  Therapeutic Interventions: 1 to 1 sessions, Unit Group sessions and Medication administration.  Evaluation of Outcomes: Not Met  Physician Treatment Plan for  Secondary Diagnosis: Active Problems:   Alcohol dependence with alcohol-induced mood disorder (HCC)   Severe recurrent major depression without psychotic features ([place])   Uncomplicated alcohol withdrawal without perceptual disturbances (HCC)   Posttraumatic stress disorder   Opioid use disorder, moderate, dependence ([place])  Long Term Goal(s): Improvement in symptoms so as ready for discharge Improvement in symptoms so as ready for discharge   Short Term Goals: Ability to identify changes in lifestyle to reduce recurrence of condition will improve Ability to verbalize feelings will improve Ability to disclose and discuss suicidal ideas Ability to demonstrate self-control will improve Ability to identify and develop effective coping behaviors will improve Ability to maintain clinical measurements within normal limits will improve Compliance with prescribed medications will improve Ability to identify triggers associated with substance abuse/mental health issues will improve Ability to identify changes in lifestyle to reduce recurrence of condition will improve Ability to verbalize feelings will improve Ability to disclose and discuss suicidal ideas Ability to demonstrate  self-control will improve Ability to identify and develop effective coping behaviors will improve Ability to maintain clinical measurements within normal limits will improve Compliance with prescribed medications will improve Ability to identify triggers associated with substance abuse/mental health issues will improve     Medication Management: Evaluate patient's response, side effects, and tolerance of medication regimen.  Therapeutic Interventions: 1 to 1 sessions, Unit Group sessions and Medication administration.  Evaluation of Outcomes: Not Met   RN Treatment Plan for Primary Diagnosis: <principal problem not specified> Long Term Goal(s): Knowledge of disease and therapeutic regimen to maintain health will improve  Short Term Goals: Ability to identify and develop effective coping behaviors will improve and Compliance with prescribed medications will improve  Medication Management: RN will administer medications as ordered by provider, will assess and evaluate patient's response and provide education to patient for prescribed medication. RN will report any adverse and/or side effects to prescribing provider.  Therapeutic Interventions: 1 on 1 counseling sessions, Psychoeducation, Medication administration, Evaluate responses to treatment, Monitor vital signs and CBGs as ordered, Perform/monitor CIWA, COWS, AIMS and Fall Risk screenings as ordered, Perform wound care treatments as ordered.  Evaluation of Outcomes: Not Met   LCSW Treatment Plan for Primary Diagnosis: <principal problem not specified> Long Term Goal(s): Safe transition to appropriate next level of care at discharge, Engage patient in therapeutic group addressing interpersonal concerns.  Short Term Goals: Engage patient in aftercare planning with referrals and resources, Increase social support and Increase skills for wellness and recovery  Therapeutic Interventions: Assess for all discharge needs, 1 to 1 time with  Social worker, Explore available resources and support systems, Assess for adequacy in community support network, Educate family and significant other(s) on suicide prevention, Complete Psychosocial Assessment, Interpersonal group therapy.  Evaluation of Outcomes: Not Met   Progress in Treatment: Attending groups: No. Participating in groups: No. Taking medication as prescribed: Yes. Toleration medication: Yes. Family/Significant other contact made: No, will contact:  when given permission Patient understands diagnosis: Yes. Discussing patient identified problems/goals with staff: Yes. Medical problems stabilized or resolved: Yes. Denies suicidal/homicidal ideation: Yes. Issues/concerns per patient self-inventory: Yes. Other: none  New problem(s) identified: No, Describe:  none  New Short Term/Long Term Goal(s):  Patient Goals:  "Finish detox, find long term treatment."  Discharge Plan or Barriers:   Reason for Continuation of Hospitalization: Depression Medication stabilization  Estimated Length of Stay: 3-5 days.  Attendees: Patient:Dawn Foley 06/06/2018   Physician: Dr. Mallie Darting, MD 06/06/2018   Nursing: Sena Hitch,  RN 06/06/2018   RN Care Manager: 06/06/2018   Social Worker: Lurline Idol, LCSW 06/06/2018   Recreational Therapist:  06/06/2018   Other:  06/06/2018   Other:  06/06/2018   Other: 06/06/2018     Scribe for Treatment Team: Joanne Chars, [place] 06/06/2018 3:55 PM

## 2018-06-06 NOTE — Progress Notes (Addendum)
Recreation Therapy Notes  Date: 9.27.19 Time: 0930 Location: [address] Dayroom  Group Topic: Stress Management  Goal Area(s) Addresses:  Patient will verbalize importance of using healthy stress management.  Patient will identify positive emotions associated with healthy stress management.   Behavioral Response: Engaged  Intervention: Stress Management  Activity :  Meditation.  LRT introduced the stress management technique of meditation.  LRT played a meditation that focused on being resilient in the face of obstacles.  Patients were to follow along as meditation played.  Education:  Stress Management, Discharge Planning.   Education Outcome: Acknowledges edcuation/In group clarification offered/Needs additional education  Clinical Observations/Feedback: Pt attended and participated in group.      Victorino Sparrow, LRT/CTRS         Victorino Sparrow A 06/06/2018 12:49 PM
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
## 2018-06-07 DIAGNOSIS — F419 Anxiety disorder, unspecified: Secondary | ICD-10-CM

## 2018-06-07 DIAGNOSIS — F332 Major depressive disorder, recurrent severe without psychotic features: Principal | ICD-10-CM

## 2018-06-07 DIAGNOSIS — G4700 Insomnia, unspecified: Secondary | ICD-10-CM

## 2018-06-07 DIAGNOSIS — E785 Hyperlipidemia, unspecified: Secondary | ICD-10-CM

## 2018-06-07 LAB — TSH: TSH: 8.505 u[IU]/mL — ABNORMAL HIGH (ref 0.350–4.500)

## 2018-06-07 MED ORDER — ATORVASTATIN CALCIUM 10 MG PO TABS
10.0000 mg | ORAL_TABLET | Freq: Every day | ORAL | Status: DC
  Administered 2018-06-07 – 2018-06-12 (×6): 10 mg via ORAL
  Filled 2018-06-07 (×7): qty 1
  Filled 2018-06-07: qty 7
  Filled 2018-06-07: qty 1

## 2018-06-07 MED ORDER — HYDROXYZINE HCL 50 MG PO TABS
50.0000 mg | ORAL_TABLET | Freq: Four times a day (QID) | ORAL | Status: AC | PRN
  Administered 2018-06-08 (×3): 50 mg via ORAL
  Filled 2018-06-07 (×4): qty 1

## 2018-06-07 MED ORDER — GABAPENTIN 400 MG PO CAPS
400.0000 mg | ORAL_CAPSULE | Freq: Three times a day (TID) | ORAL | Status: DC
  Administered 2018-06-07 – 2018-06-13 (×19): 400 mg via ORAL
  Filled 2018-06-07 (×2): qty 21
  Filled 2018-06-07 (×16): qty 1
  Filled 2018-06-07: qty 21
  Filled 2018-06-07 (×6): qty 1

## 2018-06-07 MED ORDER — NICOTINE POLACRILEX 2 MG MT GUM
2.0000 mg | CHEWING_GUM | OROMUCOSAL | Status: DC | PRN
  Administered 2018-06-07 – 2018-06-10 (×9): 2 mg via ORAL
  Filled 2018-06-07: qty 1
  Filled 2018-06-07: qty 2
  Filled 2018-06-07 (×4): qty 1

## 2018-06-07 MED ORDER — WITCH HAZEL-GLYCERIN EX PADS
MEDICATED_PAD | CUTANEOUS | Status: DC | PRN
  Administered 2018-06-08: 1 via TOPICAL
  Filled 2018-06-07: qty 100

## 2018-06-07 MED ORDER — QUETIAPINE FUMARATE 50 MG PO TABS
50.0000 mg | ORAL_TABLET | Freq: Two times a day (BID) | ORAL | Status: DC
  Administered 2018-06-07 – 2018-06-09 (×5): 50 mg via ORAL
  Filled 2018-06-07 (×7): qty 1

## 2018-06-07 MED ORDER — TRAZODONE HCL 100 MG PO TABS
100.0000 mg | ORAL_TABLET | Freq: Every evening | ORAL | Status: DC | PRN
  Administered 2018-06-07 – 2018-06-08 (×2): 100 mg via ORAL
  Filled 2018-06-07 (×3): qty 1

## 2018-06-07 MED ORDER — ACAMPROSATE CALCIUM 333 MG PO TBEC
666.0000 mg | DELAYED_RELEASE_TABLET | Freq: Three times a day (TID) | ORAL | Status: DC
  Administered 2018-06-07 – 2018-06-13 (×19): 666 mg via ORAL
  Filled 2018-06-07: qty 2
  Filled 2018-06-07: qty 42
  Filled 2018-06-07 (×16): qty 2
  Filled 2018-06-07: qty 42
  Filled 2018-06-07 (×4): qty 2
  Filled 2018-06-07: qty 42
  Filled 2018-06-07: qty 2

## 2018-06-07 MED ORDER — QUETIAPINE FUMARATE 300 MG PO TABS
300.0000 mg | ORAL_TABLET | Freq: Every day | ORAL | Status: DC
  Administered 2018-06-07 – 2018-06-12 (×6): 300 mg via ORAL
  Filled 2018-06-07 (×3): qty 1
  Filled 2018-06-07: qty 7
  Filled 2018-06-07 (×6): qty 1

## 2018-06-07 NOTE — Progress Notes (Addendum)
[facility] MD Progress Note  06/07/2018 10:49 AM Dawn Foley  MRN:  [id number]   Subjective: Patient reports today that she is still feeling extremely anxious.  She stated that she did not sleep well last night and she is not getting enough Seroquel.  Patient reports that she used to get Seroquel 300 mg 3 times a day and that was only thing that kept her stable.  Patient reports that her anxiety is extremely elevated and that she needs more medication and has requested Librium along with her Ativan detox taper.  Patient then states that she needs more medications for her hallucinations that she is reporting today.  Patient also reports that she has a heart condition and she needs her medications from that from having a heart attack in 2018.  Patient does deny any current suicidal or homicidal ideations.  She states that she was having hallucinations last night and then exacerbates the story to state that her hallucinations were there all the time.  Patient also reports that in the past that she has been diagnosed with severe bipolar disorder as well as schizophrenia.  Patient also reports having some nightmares during her withdrawals as well as having some cravings for alcohol and would like to take a medication for the cravings.  Objective: Patient's chart and findings reviewed and discussed with treatment team.  Patient presents in her room lying in her bed but is awake.  Patient sits up and states that she is super anxious and nervous all the time however patient does not present this way.  Staff have not reported patient appearing anxious during groups or in the hallway when she is talking to other peers.  Patient only has severe complaints about anxiety when she is approaching windows requesting more medication.  However due to patient's continued complaints and the possibility of been due to withdrawals will be addressed.  Principal Problem: Severe recurrent major depression without psychotic features  ([place]) Diagnosis:   Patient Active Problem List   Diagnosis Date Noted  . Uncomplicated alcohol withdrawal without perceptual disturbances ([place]) [F10.230]   . Posttraumatic stress disorder [F43.10]   . Opioid use disorder, moderate, dependence ([place]) [F11.20]   . Severe recurrent major depression without psychotic features ([place]) [F33.2] 06/05/2018  . Major depressive disorder, recurrent severe without psychotic features ([place]) [F33.2] 12/13/2017  . Tobacco use disorder [F17.200] 12/12/2017  . Borderline personality disorder ([place]) [F60.3] 11/06/2017  . Hepatitis C [B19.20]   . GERD (gastroesophageal reflux disease) [K21.9]   . Anxiety [F41.9]   . 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 Elevated LFTs [R94.5] 02/07/2017  . H/O left hemicolectomy [Z90.49] 02/07/2017  . Sinus tachycardia [R00.0]   . History of hepatitis C [Z86.19]   . Alcohol abuse [F10.10]   . Alcohol withdrawal ([place]) [F10.239] 09/03/2016  . Hypothyroidism [E03.9] 06/02/2016  . Alcohol dependence with alcohol-induced mood disorder ([place]) [F10.24] 04/07/2016   Total Time spent with patient: 30 minutes  Past Psychiatric History: See H&P  Past Medical History:  Past Medical History:  Diagnosis Date  . Alcoholism ([place])   . Anemia   . Anxiety   . Arthritis   . Asthma    as a child  . Bipolar disorder ([place])   . Cancer ([place])    colon  . Chronic kidney disease   . Complication of anesthesia    woke up during colonoscopy  . Depression   . Elevated liver enzymes   . GERD (gastroesophageal reflux  disease)   . H/O alcohol abuse    clean for 1 month as of 07/17/17  . Heart murmur    per [facility] per PT  . Hepatitis    denies  . Hepatitis C   . Hypothyroidism   . Mallory-Weiss tear   . Nicotine dependence   . Schizophrenia ([place])   . Seizures ([place])    seizures - most recent 05/2017, supposed to be on Tegretol but can't afford  . Thrombocytopenia ([facility])    Overview:   alcoholism  . Tibial plateau fracture, right   . Upper GI bleed     Past Surgical History:  Procedure Laterality Date  . APPENDECTOMY    . DILATION AND CURETTAGE OF UTERUS    . ESOPHAGOGASTRODUODENOSCOPY (EGD) WITH PROPOFOL N/A 09/04/2016   Procedure: ESOPHAGOGASTRODUODENOSCOPY (EGD) WITH PROPOFOL;  Surgeon: Doran Stabler, MD;  Location: [place];  Service: Endoscopy;  Laterality: N/A;  . HARDWARE REMOVAL Right 06/18/2017   Procedure: REMOVAL RIGHT EXTERNAL FIXATOR;  Surgeon: Altamese Lismore, MD;  Location: [place];  Service: Orthopedics;  Laterality: Right;  . HERNIA REPAIR     umbilical hernia  . KNEE CLOSED REDUCTION Right 06/18/2017   Procedure: CLOSED MANIPULATION UNDER ANESTHESIA RIGHT KNEE;  Surgeon: Altamese Willowbrook, MD;  Location: [place];  Service: Orthopedics;  Laterality: Right;  . ORIF FEMUR FRACTURE Right 07/18/2017   Procedure: REPAIR NONUNION WITH RIA;  Surgeon: Altamese Evergreen, MD;  Location: [place];  Service: Orthopedics;  Laterality: Right;  . ORIF TIBIA PLATEAU     Family History:  Family History  Problem Relation Age of Onset  . Mental illness Other   . Thyroid disease Other   . Alcoholism Brother    Family Psychiatric  History: See H&P Social History:  Social History   Substance and Sexual Activity  Alcohol Use Yes  . Alcohol/week: 16.0 standard drinks  . Types: 16 Standard drinks or equivalent per week   Comment: 1/2 gallon liquor daily     Social History   Substance and Sexual Activity  Drug Use No    Social History   Socioeconomic History  . Marital status: Single    Spouse name: Not on file  . Number of children: Not on file  . Years of education: Not on file  . Highest education level: Not on file  Occupational History  . Not on file  Social Needs  . Financial resource strain: Not on file  . Food insecurity:    Worry: Not on file    Inability: Not on file  . Transportation needs:    Medical: Not on file    Non-medical: Not on file   Tobacco Use  . Smoking status: Current Every Day Smoker    Packs/day: 1.00    Types: Cigarettes  . Smokeless tobacco: Never Used  Substance and Sexual Activity  . Alcohol use: Yes    Alcohol/week: 16.0 standard drinks    Types: 16 Standard drinks or equivalent per week    Comment: 1/2 gallon liquor daily  . Drug use: No  . Sexual activity: Not Currently    Birth control/protection: None  Lifestyle  . Physical activity:    Days per week: Not on file    Minutes per session: Not on file  . Stress: Not on file  Relationships  . Social connections:    Talks on phone: Not on file    Gets together: Not on file    Attends religious service: Not on  file    Active member of club or organization: Not on file    Attends meetings of clubs or organizations: Not on file    Relationship status: Not on file  Other Topics Concern  . Not on file  Social History Narrative  . Not on file   Additional Social History:    Pain Medications: denies Prescriptions: denies Over the Counter: denies History of alcohol / drug use?: Yes Longest period of sobriety (when/how long): 19.5 months Negative Consequences of Use: Financial, Personal relationships Withdrawal Symptoms: Nausea / Vomiting, Tremors Name of Substance 1: alcohol 1 - Age of First Use: 13 1 - Amount (size/oz): "half a gallon of liquor and beer" 1 - Frequency: daily 1 - Duration: on going 1 - Last Use / Amount: "3 days ago"                   Sleep: Poor  Appetite:  Fair  Current Medications: Current Facility-Administered Medications  Medication Dose Route Frequency Provider Last Rate Last Dose  . acamprosate (CAMPRAL) tablet 666 mg  666 mg Oral TID WC Money, Lowry Ram, FNP      . alum & mag hydroxide-simeth (MAALOX/MYLANTA) 200-200-20 MG/5ML suspension 30 mL  30 mL Oral Q4H PRN Lindon Romp A, NP      . atorvastatin (LIPITOR) tablet 10 mg  10 mg Oral q1800 Money, Lowry Ram, FNP      . dolutegravir (TIVICAY) tablet 50 mg   50 mg Oral Daily Sharma Covert, MD   50 mg at 06/07/18 0744  . emtricitabine-tenofovir AF (DESCOVY) 200-25 MG per tablet 1 tablet  1 tablet Oral Daily Sharma Covert, MD   1 tablet at 06/07/18 0743  . folic acid (FOLVITE) tablet 1 mg  1 mg Oral Daily Sharma Covert, MD   1 mg at 06/07/18 0744  . gabapentin (NEURONTIN) capsule 400 mg  400 mg Oral TID Money, Lowry Ram, FNP      . hydrOXYzine (ATARAX/VISTARIL) tablet 50 mg  50 mg Oral Q6H PRN Money, Lowry Ram, FNP      . ibuprofen (ADVIL,MOTRIN) tablet 600 mg  600 mg Oral Q6H PRN Sharma Covert, MD   600 mg at 06/07/18 0802  . levETIRAcetam (KEPPRA) tablet 500 mg  500 mg Oral BID Sharma Covert, MD   500 mg at 06/07/18 0745  . levothyroxine (SYNTHROID, LEVOTHROID) tablet 75 mcg  75 mcg Oral QAC breakfast Lindon Romp A, NP   75 mcg at 06/07/18 0626  . loperamide (IMODIUM) capsule 2-4 mg  2-4 mg Oral PRN Lindon Romp A, NP      . LORazepam (ATIVAN) tablet 1 mg  1 mg Oral Q6H PRN Lindon Romp A, NP   1 mg at 06/07/18 0214  . LORazepam (ATIVAN) tablet 1 mg  1 mg Oral TID Rozetta Nunnery, NP       Followed by  . [START ON 06/08/2018] LORazepam (ATIVAN) tablet 1 mg  1 mg Oral BID Rozetta Nunnery, NP       Followed by  . [START ON 06/10/2018] LORazepam (ATIVAN) tablet 1 mg  1 mg Oral Daily Lindon Romp A, NP      . magnesium hydroxide (MILK OF MAGNESIA) suspension 30 mL  30 mL Oral Daily PRN Lindon Romp A, NP      . naphazoline-glycerin (CLEAR EYES REDNESS) ophth solution 1-2 drop  1-2 drop Both Eyes QID PRN Mallie Darting Cordie Grice, MD   1 drop at  06/07/18 0747  . nicotine polacrilex (NICORETTE) gum 2 mg  2 mg Oral PRN Sharma Covert, MD   2 mg at 06/07/18 0802  . pantoprazole (PROTONIX) EC tablet 40 mg  40 mg Oral Daily Lindon Romp A, NP   40 mg at 06/07/18 0743  . promethazine (PHENERGAN) tablet 25 mg  25 mg Oral Q6H PRN Sharma Covert, MD   25 mg at 06/07/18 0743  . QUEtiapine (SEROQUEL) tablet 300 mg  300 mg Oral QHS Money, Lowry Ram, FNP      . QUEtiapine (SEROQUEL) tablet 50 mg  50 mg Oral BID Money, Darnelle Maffucci B, FNP      . thiamine (VITAMIN B-1) tablet 100 mg  100 mg Oral Daily Lindon Romp A, NP   100 mg at 06/07/18 0743  . traZODone (DESYREL) tablet 100 mg  100 mg Oral QHS PRN Money, Lowry Ram, FNP        Lab Results:  Results for orders placed or performed during the hospital encounter of 06/05/18 (from the past 48 hour(s))  Rapid HIV screen (HIV 1/2 Ab+Ag)     Status: None   Collection Time: 06/06/18  6:43 PM  Result Value Ref Range   HIV-1 P24 Antigen - HIV24 NON REACTIVE NON REACTIVE   HIV 1/2 Antibodies NON REACTIVE NON REACTIVE   Interpretation (HIV Ag Ab)      A non reactive test result means that HIV 1 or HIV 2 antibodies and HIV 1 p24 antigen were not detected in the specimen.    Comment: RESULT CALLED TO, READ BACK BY AND VERIFIED WITH: Seymour Bars RN [id number] 06/06/2018 H[place] K Performed at [facility], [place] [address], [place], [place] [address]   TSH     Status: Abnormal   Collection Time: 06/07/18  6:24 AM  Result Value Ref Range   TSH 8.505 (H) 0.350 - 4.500 uIU/mL    Comment: Performed by a 3rd Generation assay with a functional sensitivity of <=0.01 uIU/mL. Performed at [facility], [place] [address] [place], [place] [address]     Blood Alcohol level:  Lab Results  Component Value Date   [facility] <10 07/18/2017   ETH <10 99/83/3825    Metabolic Disorder Labs: Lab Results  Component Value Date   HGBA1C 4.7 (L) 11/06/2017   MPG 88.19 11/06/2017   MPG 111.15 07/18/2017   No results found for: PROLACTIN Lab Results  Component Value Date   CHOL 332 (H) 11/06/2017   TRIG 318 (H) 11/06/2017   HDL 19 (L) 11/06/2017   CHOLHDL 17.5 11/06/2017   VLDL 64 (H) 11/06/2017   LDLCALC 249 (H) 11/06/2017   LDLCALC 77 06/02/2016    Physical Findings: AIMS: Facial and Oral Movements Muscles of Facial Expression: None, normal Lips and Perioral Area: None,  normal Jaw: None, normal Tongue: None, normal,Extremity Movements Upper (arms, wrists, hands, fingers): Mild(visible tremors) Lower (legs, knees, ankles, toes): None, normal, Trunk Movements Neck, shoulders, hips: None, normal, Overall Severity Severity of abnormal movements (highest score from questions above): None, normal Incapacitation due to abnormal movements: None, normal Patient's awareness of abnormal movements (rate only patient's report): Aware, no distress, Dental Status Current problems with teeth and/or dentures?: No Does patient usually wear dentures?: No  CIWA:  CIWA-Ar Total: 11 COWS:  COWS Total Score: 12  Musculoskeletal: Strength & Muscle Tone: within normal limits Gait & Station: normal Patient leans: N/A  Psychiatric Specialty Exam: Physical Exam  Nursing note and vitals  reviewed. Constitutional: She is oriented to person, place, and time. She appears well-developed and well-nourished.  Respiratory: Effort normal.  Musculoskeletal: Normal range of motion.  Neurological: She is alert and oriented to person, place, and time.  Skin: Skin is warm.    Review of Systems  Constitutional: Negative.   HENT: Negative.   Eyes: Negative.   Respiratory: Negative.   Cardiovascular: Negative.   Gastrointestinal: Negative.   Genitourinary: Negative.   Musculoskeletal: Negative.   Skin: Negative.   Neurological: Negative.   Endo/Heme/Allergies: Negative.   Psychiatric/Behavioral: Positive for depression and substance abuse. Negative for suicidal ideas. The patient is nervous/anxious and has insomnia.     Blood pressure (!) 95/59, pulse (!) 108, temperature 97.8 F (36.6 C), resp. rate 16, height 5\' 4"  (1.626 m), weight 80.7 kg, last menstrual period 05/05/2018.Body mass index is 30.55 kg/m.  General Appearance: Casual  Eye Contact:  Good  Speech:  Pressured  Volume:  Normal  Mood:  Anxious  Affect:  Congruent  Thought Process:  Goal Directed and Descriptions of  Associations: Intact  Orientation:  Full (Time, Place, and Person)  Thought Content:  Hallucinations: Auditory  Suicidal Thoughts:  No  Homicidal Thoughts:  No  Memory:  Immediate;   Good Recent;   Good Remote;   Good  Judgement:  Fair  Insight:  Fair  Psychomotor Activity:  Normal  Concentration:  Concentration: Good and Attention Span: Good  Recall:  Good  Fund of Knowledge:  Good  Language:  Good  Akathisia:  No  Handed:  Right  AIMS (if indicated):     Assets:  Communication Skills Desire for Improvement Financial Resources/Insurance Housing Social Support Transportation  ADL's:  Intact  Cognition:  WNL  Sleep:  Number of Hours: 5.75     Treatment Plan Summary: Reviewed patient's chart do not see any diagnoses of schizophrenia.  See to previous hospitalizations this year at [facility] where patient was on Seroquel 200 mg.  There is documentation is also sure that patient was very medication seeking.  However did discussed with Dr. Parke Poisson and will increase her Seroquel nighttime dose and add to smaller doses during the day to help with patient's anxiety/agitation. Daily contact with patient to assess and evaluate symptoms and progress in treatment, Medication management and Plan is to: Start Campral 666 mg p.o. 3 times daily for alcohol cravings Continue Ativan detox protocol Restart Lipitor 10 mg p.o. daily for hyperlipidemia Increase gabapentin to 400 mg p.o. 3 times daily for withdrawal symptoms Continue Vistaril 50 mg p.o. every 6 hours as needed for anxiety agitation Increase Seroquel 300 mg p.o. nightly for mood stability Start Seroquel 50 mg p.o. twice daily for mood stability Increase trazodone 100 mg p.o. nightly as needed for insomnia Encourage group therapy participation  Lewis Shock, FNP 06/07/2018, 10:49 AM   ..Agree with NP Progress Note
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
## 2018-06-07 NOTE — Progress Notes (Signed)
D.  Pt appeared agitated and anxious on approach, Pt called out loudly for writer, stated that she was "about to lose her shit".  Pt requested bedtime medications early, stating that she was going to bed.  Pt denies SI/HI/AVH at this time.  Pt states "leave me alone".  A.  Support and encouragement offered, medications given per request.  R.  Pt remains safe on the unit, will continue to monitor.

## 2018-06-07 NOTE — B[facility] Group Notes (Signed)
[facility] Group Notes:  (Nursing/MHT/Case Management/Adjunct)  Date:  06/07/2018  Time:  2:41 PM  Type of Therapy:  Nurse Education  Participation Level:  Did Not Attend  Summary of Progress/Problems: When invited, patient said she was not feeling well and went to her room to lie down.   Marya Landry 06/07/2018, 2:41 PM

## 2018-06-07 NOTE — B[facility] Group Notes (Signed)
Pt went to [employer] meeting on [address] hall.

## 2018-06-07 NOTE — BHH Group Notes (Signed)
[facility] Group Notes:  (Nursing/MHT/Case Management/Adjunct)  Date:  06/07/2018  Time:  1:30 PM  Type of Therapy:  Nurse Education  Participation Level:  Active  Participation Quality:  Appropriate and Sharing  Affect:  Anxious  Cognitive:  Alert and Appropriate  Insight:  Appropriate  Engagement in Group:  Monopolizing  Modes of Intervention:  Discussion and Support  Summary of Progress/Problems: Patient monopolized the group, then left halfway through.  Megan Mans 06/07/2018, 3:14 PM

## 2018-06-07 NOTE — Plan of Care (Signed)
  Problem: Activity: Goal: Interest or engagement in activities will improve Outcome: Not Progressing Patient remains medication focused.

## 2018-06-07 NOTE — Plan of Care (Signed)
Patient slept poor last night, sleep medication was requested and not helpful. Appetite has been fair, energy level low, concentration poor. Patient is endorsing Si. Also endorses tremors, diarrhea, cravings, agitation, runny nose, sedation, chilling, cramping, nausea, and irritability. Patient is endorsing lightheadedness, dizziness, headaches, rash, blurred vision, and pain rated 10/10 in her leg and head. "Patient's goal is to attend all meetings."  Patient denied SI to Probation officer when writing. Patient is extremely med-seeking and will come to the window for anything and everything. Patient claims she is anxious but she will be cool, calm, and collected in group/recreation.  Patient safety is maintained with 15 minute checks as well as environmental checks. Will continue to monitor.  Problem: Education: Goal: Emotional status will improve Outcome: Progressing   Problem: Education: Goal: Mental status will improve Outcome: Progressing   Problem: Education: Goal: Verbalization of understanding the information provided will improve Outcome: Progressing
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
## 2018-06-08 MED ORDER — LORATADINE 10 MG PO TABS
10.0000 mg | ORAL_TABLET | Freq: Every day | ORAL | Status: DC
  Administered 2018-06-08 – 2018-06-13 (×6): 10 mg via ORAL
  Filled 2018-06-08 (×9): qty 1

## 2018-06-08 MED ORDER — CLOTRIMAZOLE 1 % VA CREA
1.0000 | TOPICAL_CREAM | Freq: Every day | VAGINAL | Status: DC
  Administered 2018-06-08 – 2018-06-12 (×5): 1 via VAGINAL
  Filled 2018-06-08: qty 45

## 2018-06-08 MED ORDER — MENTHOL 3 MG MT LOZG
1.0000 | LOZENGE | OROMUCOSAL | Status: DC | PRN
  Administered 2018-06-08: 3 mg via ORAL

## 2018-06-08 NOTE — Progress Notes (Addendum)
[facility] MD Progress Note  06/08/2018 11:29 AM Dawn Foley  MRN:  [id number]   Subjective:I keep trying to get the voices to start. I want to hurt people but I dont know these peoples name. I get my heart medications from [facility]. Im detoxing pretty bad and Im staying sick. Im an alcoholic and I cant detox in 2 days from Ativan. I feel better today than I have since I been here.    Objective: Patient's chart and findings reviewed and discussed with treatment team.  We will continue her on a Ativan taper for detox and continue her Seroquel to 50 mg by mouth twice a day and 300 mg by mouth daily at bedtime for mood and depression control. At this time patient is offered medication adjustment to Seroquel, and she declines. SHe is advised that she has been taking SEroqeul for several years and continues to report auditory hallucinations and homicidal ideations. Will continue switching to Zyprexa however patient refuses. Throughout the evaluation she continues to ruminate about her medical problems, mood lability, and need for Ativan. An extensive review of her medical chart and MAR reveal patient has an long chronic history of alcohol abuse that has resulted in liver complications, cardiovascular complications and viral illnesses. SHe is very labile and exhibits psychomotor agitation at this time in which she is encouraged to utilize her coping skills. SHe is advised taht she can rely on medication alone for her symptoms. Once she is on his mood stabilizer he may wish to consider adding an antidepressant for or antianxiety medicine such as an SSRI or SNRI. The patient does endorse ongoing suicidal ideations she does not appear to want to act on them at this moment but she does report still having thoughts with plans to harm others. She can not identify them at this time, and she may be confabulating. We will continue to monitor and adjust her level of observation as needed. SHe does express interest in  going to a treatment program for substance abuse and social work will be exploring options with her.  Principal Problem: Severe recurrent major depression without psychotic features ([place]) Diagnosis:   Patient Active Problem List   Diagnosis Date Noted  . Uncomplicated alcohol withdrawal without perceptual disturbances ([place]) [F10.230]   . Posttraumatic stress disorder [F43.10]   . Opioid use disorder, moderate, dependence ([place]) [F11.20]   . Severe recurrent major depression without psychotic features ([place]) [F33.2] 06/05/2018  . Major depressive disorder, recurrent severe without psychotic features ([place]) [F33.2] 12/13/2017  . Tobacco use disorder [F17.200] 12/12/2017  . Borderline personality disorder ([place]) [F60.3] 11/06/2017  . Hepatitis C [B19.20]   . GERD (gastroesophageal reflux disease) [K21.9]   . Anxiety [F41.9]   . 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 Elevated LFTs [R94.5] 02/07/2017  . H/O left hemicolectomy [Z90.49] 02/07/2017  . Sinus tachycardia [R00.0]   . History of hepatitis C [Z86.19]   . Alcohol abuse [F10.10]   . Alcohol withdrawal ([place]) [F10.239] 09/03/2016  . Hypothyroidism [E03.9] 06/02/2016  . Alcohol dependence with alcohol-induced mood disorder ([place]) [F10.24] 04/07/2016   Total Time spent with patient: 30 minutes  Past Psychiatric History: See H&P  Past Medical History:  Past Medical History:  Diagnosis Date  . Alcoholism ([place])   . Anemia   . Anxiety   . Arthritis   . Asthma    as a child  . Bipolar disorder ([place])   . Cancer ([place])    colon  .  Chronic kidney disease   . Complication of anesthesia    woke up during colonoscopy  . Depression   . Elevated liver enzymes   . GERD (gastroesophageal reflux disease)   . H/O alcohol abuse    clean for 1 month as of 07/17/17  . Heart murmur    per [facility] per PT  . Hepatitis    denies  . Hepatitis C   . Hypothyroidism   . Mallory-Weiss tear   . Nicotine  dependence   . Schizophrenia ([place])   . Seizures ([place])    seizures - most recent 05/2017, supposed to be on Tegretol but can't afford  . Thrombocytopenia (HCC)    Overview:  alcoholism  . Tibial plateau fracture, right   . Upper GI bleed     Past Surgical History:  Procedure Laterality Date  . APPENDECTOMY    . DILATION AND CURETTAGE OF UTERUS    . ESOPHAGOGASTRODUODENOSCOPY (EGD) WITH PROPOFOL N/A 09/04/2016   Procedure: ESOPHAGOGASTRODUODENOSCOPY (EGD) WITH PROPOFOL;  Surgeon: Doran Stabler, MD;  Location: [place];  Service: Endoscopy;  Laterality: N/A;  . HARDWARE REMOVAL Right 06/18/2017   Procedure: REMOVAL RIGHT EXTERNAL FIXATOR;  Surgeon: Altamese [place], MD;  Location: [place];  Service: Orthopedics;  Laterality: Right;  . HERNIA REPAIR     umbilical hernia  . KNEE CLOSED REDUCTION Right 06/18/2017   Procedure: CLOSED MANIPULATION UNDER ANESTHESIA RIGHT KNEE;  Surgeon: Altamese Poynor, MD;  Location: [place];  Service: Orthopedics;  Laterality: Right;  . ORIF FEMUR FRACTURE Right 07/18/2017   Procedure: REPAIR NONUNION WITH RIA;  Surgeon: Altamese [place], MD;  Location: [place];  Service: Orthopedics;  Laterality: Right;  . ORIF TIBIA PLATEAU     Family History:  Family History  Problem Relation Age of Onset  . Mental illness Other   . Thyroid disease Other   . Alcoholism Brother    Family Psychiatric  History: See H&P Social History:  Social History   Substance and Sexual Activity  Alcohol Use Yes  . Alcohol/week: 16.0 standard drinks  . Types: 16 Standard drinks or equivalent per week   Comment: 1/2 gallon liquor daily     Social History   Substance and Sexual Activity  Drug Use No    Social History   Socioeconomic History  . Marital status: Single    Spouse name: Not on file  . Number of children: Not on file  . Years of education: Not on file  . Highest education level: Not on file  Occupational History  . Not on file  Social Needs  . Financial  resource strain: Not on file  . Food insecurity:    Worry: Not on file    Inability: Not on file  . Transportation needs:    Medical: Not on file    Non-medical: Not on file  Tobacco Use  . Smoking status: Current Every Day Smoker    Packs/day: 1.00    Types: Cigarettes  . Smokeless tobacco: Never Used  Substance and Sexual Activity  . Alcohol use: Yes    Alcohol/week: 16.0 standard drinks    Types: 16 Standard drinks or equivalent per week    Comment: 1/2 gallon liquor daily  . Drug use: No  . Sexual activity: Not Currently    Birth control/protection: None  Lifestyle  . Physical activity:    Days per week: Not on file    Minutes per session: Not on file  . Stress: Not on file  Relationships  . Social connections:    Talks on phone: Not on file    Gets together: Not on file    Attends religious service: Not on file    Active member of club or organization: Not on file    Attends meetings of clubs or organizations: Not on file    Relationship status: Not on file  Other Topics Concern  . Not on file  Social History Narrative  . Not on file   Additional Social History:    Pain Medications: denies Prescriptions: denies Over the Counter: denies History of alcohol / drug use?: Yes Longest period of sobriety (when/how long): 19.5 months Negative Consequences of Use: Financial, Personal relationships Withdrawal Symptoms: Nausea / Vomiting, Tremors Name of Substance 1: alcohol 1 - Age of First Use: 13 1 - Amount (size/oz): "half a gallon of liquor and beer" 1 - Frequency: daily 1 - Duration: on going 1 - Last Use / Amount: "3 days ago"                   Sleep: Poor  Appetite:  Fair  Current Medications: Current Facility-Administered Medications  Medication Dose Route Frequency Provider Last Rate Last Dose  . acamprosate (CAMPRAL) tablet 666 mg  666 mg Oral TID WC Money, Lowry Ram, FNP   666 mg at 06/08/18 0610  . alum & mag hydroxide-simeth  (MAALOX/MYLANTA) 200-200-20 MG/5ML suspension 30 mL  30 mL Oral Q4H PRN Lindon Romp A, NP      . atorvastatin (LIPITOR) tablet 10 mg  10 mg Oral q1800 Money, Lowry Ram, FNP   10 mg at 06/07/18 1704  . dolutegravir (TIVICAY) tablet 50 mg  50 mg Oral Daily Sharma Covert, MD   50 mg at 06/08/18 0745  . emtricitabine-tenofovir AF (DESCOVY) 200-25 MG per tablet 1 tablet  1 tablet Oral Daily Sharma Covert, MD   1 tablet at 06/08/18 0744  . folic acid (FOLVITE) tablet 1 mg  1 mg Oral Daily Sharma Covert, MD   1 mg at 06/08/18 0746  . gabapentin (NEURONTIN) capsule 400 mg  400 mg Oral TID Money, Lowry Ram, FNP   400 mg at 06/08/18 0745  . hydrOXYzine (ATARAX/VISTARIL) tablet 50 mg  50 mg Oral Q6H PRN Money, Lowry Ram, FNP   50 mg at 06/08/18 1638  . ibuprofen (ADVIL,MOTRIN) tablet 600 mg  600 mg Oral Q6H PRN Sharma Covert, MD   600 mg at 06/07/18 0802  . levETIRAcetam (KEPPRA) tablet 500 mg  500 mg Oral BID Sharma Covert, MD   500 mg at 06/08/18 0746  . levothyroxine (SYNTHROID, LEVOTHROID) tablet 75 mcg  75 mcg Oral QAC breakfast Lindon Romp A, NP   75 mcg at 06/08/18 0610  . loperamide (IMODIUM) capsule 2-4 mg  2-4 mg Oral PRN Lindon Romp A, NP      . LORazepam (ATIVAN) tablet 1 mg  1 mg Oral Q6H PRN Lindon Romp A, NP   1 mg at 06/07/18 2002  . LORazepam (ATIVAN) tablet 1 mg  1 mg Oral BID Rozetta Nunnery, NP       Followed by  . [START ON 06/10/2018] LORazepam (ATIVAN) tablet 1 mg  1 mg Oral Daily Lindon Romp A, NP      . magnesium hydroxide (MILK OF MAGNESIA) suspension 30 mL  30 mL Oral Daily PRN Lindon Romp A, NP      . naphazoline-glycerin (CLEAR EYES REDNESS) ophth solution 1-2 drop  1-2 drop Both Eyes QID PRN Sharma Covert, MD   2 drop at 06/07/18 1312  . nicotine polacrilex (NICORETTE) gum 2 mg  2 mg Oral PRN Sharma Covert, MD   2 mg at 06/07/18 1812  . pantoprazole (PROTONIX) EC tablet 40 mg  40 mg Oral Daily Lindon Romp A, NP   40 mg at 06/08/18 0744  .  promethazine (PHENERGAN) tablet 25 mg  25 mg Oral Q6H PRN Sharma Covert, MD   25 mg at 06/08/18 0748  . QUEtiapine (SEROQUEL) tablet 300 mg  300 mg Oral QHS Money, Lowry Ram, FNP   300 mg at 06/07/18 2002  . QUEtiapine (SEROQUEL) tablet 50 mg  50 mg Oral BID Money, Lowry Ram, FNP   50 mg at 06/08/18 0746  . thiamine (VITAMIN B-1) tablet 100 mg  100 mg Oral Daily Lindon Romp A, NP   100 mg at 06/08/18 0746  . traZODone (DESYREL) tablet 100 mg  100 mg Oral QHS PRN Money, Lowry Ram, FNP   100 mg at 06/07/18 2026  . witch hazel-glycerin (TUCKS) pad   Topical PRN Derrill Center, NP        Lab Results:  Results for orders placed or performed during the hospital encounter of 06/05/18 (from the past 48 hour(s))  Rapid HIV screen (HIV 1/2 Ab+Ag)     Status: None   Collection Time: 06/06/18  6:43 PM  Result Value Ref Range   HIV-1 P24 Antigen - HIV24 NON REACTIVE NON REACTIVE   HIV 1/2 Antibodies NON REACTIVE NON REACTIVE   Interpretation (HIV Ag Ab)      A non reactive test result means that HIV 1 or HIV 2 antibodies and HIV 1 p24 antigen were not detected in the specimen.    Comment: RESULT CALLED TO, READ BACK BY AND VERIFIED WITH: Seymour Bars RN 2038 06/06/2018 H[place] K Performed at [facility], [place] [address] [place], [place] [address]   TSH     Status: Abnormal   Collection Time: 06/07/18  6:24 AM  Result Value Ref Range   TSH 8.505 (H) 0.350 - 4.500 uIU/mL    Comment: Performed by a 3rd Generation assay with a functional sensitivity of <=0.01 uIU/mL. Performed at [facility], [place] [address], [place], [place] [address]     Blood Alcohol level:  Lab Results  Component Value Date   [facility] <10 07/18/2017   ETH <10 25/01/3975    Metabolic Disorder Labs: Lab Results  Component Value Date   HGBA1C 4.7 (L) 11/06/2017   MPG 88.19 11/06/2017   MPG 111.15 07/18/2017   No results found for: PROLACTIN Lab Results  Component Value Date   CHOL 332  (H) 11/06/2017   TRIG 318 (H) 11/06/2017   HDL 19 (L) 11/06/2017   CHOLHDL 17.5 11/06/2017   VLDL 64 (H) 11/06/2017   LDLCALC 249 (H) 11/06/2017   LDLCALC 77 06/02/2016    Physical Findings: AIMS: Facial and Oral Movements Muscles of Facial Expression: None, normal Lips and Perioral Area: None, normal Jaw: None, normal Tongue: None, normal,Extremity Movements Upper (arms, wrists, hands, fingers): Mild(Visible tremor) Lower (legs, knees, ankles, toes): None, normal, Trunk Movements Neck, shoulders, hips: None, normal, Overall Severity Severity of abnormal movements (highest score from questions above): None, normal Incapacitation due to abnormal movements: None, normal Patient's awareness of abnormal movements (rate only patient's report): No Awareness, Dental Status Current problems with teeth and/or dentures?: No Does patient usually wear  dentures?: No  CIWA:  CIWA-Ar Total: 2 COWS:  COWS Total Score: 1  Musculoskeletal: Strength & Muscle Tone: within normal limits Gait & Station: normal Patient leans: N/A  Psychiatric Specialty Exam: Physical Exam  Nursing note and vitals reviewed. Constitutional: She is oriented to person, place, and time. She appears well-developed and well-nourished.  Respiratory: Effort normal.  Musculoskeletal: Normal range of motion.  Neurological: She is alert and oriented to person, place, and time.  Skin: Skin is warm.    Review of Systems  Constitutional: Negative.   HENT: Negative.   Eyes: Negative.   Respiratory: Negative.   Cardiovascular: Negative.   Gastrointestinal: Negative.   Genitourinary: Negative.   Musculoskeletal: Negative.   Skin: Negative.   Neurological: Negative.   Endo/Heme/Allergies: Negative.   Psychiatric/Behavioral: Positive for depression and substance abuse. Negative for suicidal ideas. The patient is nervous/anxious and has insomnia.     Blood pressure (!) 113/57, pulse (!) 114, temperature 98 F (36.7 C),  temperature source Oral, resp. rate 16, height 5\' 4"  (1.626 m), weight 80.7 kg, last menstrual period 05/05/2018.Body mass index is 30.55 kg/m.  General Appearance: Casual  Eye Contact:  Good  Speech:  Pressured  Volume:  Increased  Mood:  Anxious, Dysphoric and Irritable  Affect:  Labile and Full Range  Thought Process:  Coherent, Goal Directed and Descriptions of Associations: Tangential  Orientation:  Full (Time, Place, and Person)  Thought Content:  Hallucinations: Auditory  Suicidal Thoughts:  No  Homicidal Thoughts:  No  Memory:  Immediate;   Good Recent;   Good Remote;   Good  Judgement:  Fair  Insight:  Fair  Psychomotor Activity:  Increased  Concentration:  Concentration: Good and Attention Span: Good  Recall:  Good  Fund of Knowledge:  Good  Language:  Good  Akathisia:  No  Handed:  Right  AIMS (if indicated):     Assets:  Communication Skills Desire for Improvement Financial Resources/Insurance Housing Social Support Transportation  ADL's:  Intact  Cognition:  WNL  Sleep:  Number of Hours: 5.75     Treatment Plan Summary: Reviewed patient's chart do not see any diagnoses of schizophrenia.  See to previous hospitalizations this year at [facility] where patient was on Seroquel 200 mg.  There is documentation is also sure that patient was very medication seeking.  However did discussed with Dr. Parke Poisson and will increase her Seroquel nighttime dose and add to smaller doses during the day to help with patient's anxiety/agitation.  Daily contact with patient to assess and evaluate symptoms and progress in treatment, Medication management and Plan is to: Start Campral 666 mg p.o. 3 times daily for alcohol cravings Continue Ativan detox protocol Restart Lipitor 10 mg p.o. daily for hyperlipidemia Increase gabapentin to 400 mg p.o. 3 times daily for withdrawal symptoms Continue Vistaril 50 mg p.o. every 6 hours as needed for anxiety agitation Increase Seroquel 300 mg p.o.  nightly for mood stability Continue Seroquel 50 mg p.o. twice daily for mood stability Increase trazodone 100 mg p.o. nightly as needed for insomnia Encourage group therapy participation  Nanci Pina, [place] 06/08/2018, 11:29 AM   ..Agree with NP Progress Note

## 2018-06-08 NOTE — Progress Notes (Signed)
Psychoeducational Group Note  Date:  06/08/2018 Time:  1350  Group Topic/Focus:  Conflict Resolution:   The focus of this group is to discuss the conflict resolution process and how it may be used upon discharge.  Participation Level: Did Not Attend  Participation Quality:  Not Applicable  Affect:  Not Applicable  Cognitive:  Not Applicable  Insight:  Not Applicable  Engagement in Group: Not Applicable  Additional Comments:  Pt was asleep and did not attend group this morning.  Brandom Kerwin E 06/08/2018, 3:36 PM
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
## 2018-06-08 NOTE — Progress Notes (Signed)
D.  Pt pleasant on approach, complaint of anxiety.  Pt was positive for evening wrap up group with appropriate participation.  Pt observed interacting appropriately with peers on the unit.  Pt denies SI/HI/AVH at this time although it was reported that she had experienced some passive SI and HI earlier today.  A.  Support and encouragement offered, medication given as ordered  R.  Pt remains safe on the unit, will continue to monitor.

## 2018-06-08 NOTE — BH Assessment (Signed)
Adult Psychoeducational Group Note  Date:  06/08/2018 Time:  5:50 PM  Group Topic/Focus:  Overcoming Stress:   The focus of this group is to define stress and help patients assess their triggers.  Participation Level:  Active  Participation Quality:  Appropriate  Affect:  Appropriate  Cognitive:  Appropriate  Insight: Appropriate  Engagement in Group:  Engaged  Modes of Intervention:  Activity, Discussion, Education, Exploration, Problem-solving, Socialization and Support  Additional Comments:  Pt participated during RN psychoeducational group and shared that "my coping skill for stress is sex."  Harriet Masson 06/08/2018, 5:50 PM

## 2018-06-08 NOTE — BHH Suicide Risk Assessment (Addendum)
[facility] INPATIENT:  Family/Significant Other Suicide Prevention Education  Suicide Prevention Education:  Contact Attempts:Tracy Monday, mother, [phone] , (name of family member/significant other) has been identified by the patient as the family member/significant other with whom the patient will be residing, and identified as the person(s) who will aid the patient in the event of a mental health crisis.  With written consent from the patient, two attempts were made to provide suicide prevention education, prior to and/or following the patient's discharge.  We were unsuccessful in providing suicide prevention education.  A suicide education pamphlet was given to the patient to share with family/significant other.  Date and time of first attempt: 06/08/18 at 5:21 pm Date and time of second attempt: 06/12/2018 4:26 PM   Tye Savoy 06/08/2018, 5:22 PM

## 2018-06-08 NOTE — BHH Group Notes (Signed)
Adult Psychoeducational Group Note  Date:  06/08/2018 Time:  8:54 PM  Group Topic/Focus:  Wrap-Up Group:   The focus of this group is to help patients review their daily goal of treatment and discuss progress on daily workbooks.  Participation Level:  Active  Participation Quality:  Appropriate and Attentive  Affect:  Appropriate  Cognitive:  Alert and Appropriate  Insight: Appropriate and Good  Engagement in Group:  Engaged  Modes of Intervention:  Discussion and Education  Additional Comments:  Pt attended and participated in wrap up group this evening. Pt rated their day a 7/10 due to them playing cards and interacting with their hall mates. Pt completed their goal which was to get their medications adjusted.    Cristi Loron 06/08/2018, 8:54 PM

## 2018-06-08 NOTE — Plan of Care (Signed)
D: Patient presents anxious, depressed. She reports sleeping poorly last night, and receiving medication that was not helpful. Her appetite is poor, energy low, and concentration poor. She rates her depression, anxiety and hopelessness 0/10. She has numerous somatic complaints. Her appetite is good, energy low, and concentration poor. Patient denies SI/HI/AVH.  A: Patient checked q15 min, and checks reviewed. Reviewed medication changes with patient and educated on side effects. Educated patient on importance of attending group therapy sessions and educated on several coping skills. Encouarged participation in milieu through recreation therapy and attending meals with peers. Support and encouragement provided. Fluids offered. R: Patient receptive to education on medications, and is medication compliant. Patient contracts for safety on the unit. Goals: "To get my heart meds and speak to my Education officer, museum."

## 2018-06-09 LAB — CBC WITH DIFFERENTIAL/PLATELET
BASOS ABS: 0 10*3/uL (ref 0.0–0.1)
Basophils Relative: 0 %
EOS PCT: 2 %
Eosinophils Absolute: 0.1 10*3/uL (ref 0.0–0.7)
HEMATOCRIT: 32.3 % — AB (ref 36.0–46.0)
Hemoglobin: 10 g/dL — ABNORMAL LOW (ref 12.0–15.0)
LYMPHS ABS: 1.4 10*3/uL (ref 0.7–4.0)
LYMPHS PCT: 37 %
MCH: 26.2 pg (ref 26.0–34.0)
MCHC: 31 g/dL (ref 30.0–36.0)
MCV: 84.6 fL (ref 78.0–100.0)
Monocytes Absolute: 0.9 10*3/uL (ref 0.1–1.0)
Monocytes Relative: 23 %
NEUTROS ABS: 1.4 10*3/uL — AB (ref 1.7–7.7)
Neutrophils Relative %: 38 %
Platelets: 213 10*3/uL (ref 150–400)
RBC: 3.82 MIL/uL — AB (ref 3.87–5.11)
RDW: 17.5 % — ABNORMAL HIGH (ref 11.5–15.5)
WBC: 3.8 10*3/uL — ABNORMAL LOW (ref 4.0–10.5)

## 2018-06-09 LAB — BASIC METABOLIC PANEL
ANION GAP: 8 (ref 5–15)
BUN: 10 mg/dL (ref 6–20)
CHLORIDE: 109 mmol/L (ref 98–111)
CO2: 24 mmol/L (ref 22–32)
Calcium: 9.3 mg/dL (ref 8.9–10.3)
Creatinine, Ser: 0.85 mg/dL (ref 0.44–1.00)
GFR calc Af Amer: >60 mL/min (ref >60)
GFR calc non Af Amer: >60 mL/min (ref >60)
GLUCOSE: 93 mg/dL (ref 70–99)
POTASSIUM: 4.1 mmol/L (ref 3.5–5.1)
Sodium: 141 mmol/L (ref 135–145)

## 2018-06-09 LAB — URINALYSIS, COMPLETE (UACMP) WITH MICROSCOPIC
BILIRUBIN URINE: NEGATIVE
Glucose, UA: NEGATIVE mg/dL
HGB URINE DIPSTICK: NEGATIVE
Ketones, ur: NEGATIVE mg/dL
NITRITE: NEGATIVE
PH: 5 (ref 5.0–8.0)
Protein, ur: NEGATIVE mg/dL
SPECIFIC GRAVITY, URINE: 1.008 (ref 1.005–1.030)

## 2018-06-09 LAB — PREGNANCY, URINE: PREG TEST UR: NEGATIVE

## 2018-06-09 MED ORDER — HYDROCERIN EX CREA
TOPICAL_CREAM | Freq: Two times a day (BID) | CUTANEOUS | Status: DC
  Administered 2018-06-09 – 2018-06-12 (×7): via TOPICAL
  Administered 2018-06-13: 1 via TOPICAL
  Filled 2018-06-09: qty 113

## 2018-06-09 MED ORDER — HYDROXYZINE HCL 25 MG PO TABS
25.0000 mg | ORAL_TABLET | Freq: Four times a day (QID) | ORAL | Status: DC | PRN
  Administered 2018-06-09 – 2018-06-13 (×8): 25 mg via ORAL
  Filled 2018-06-09 (×6): qty 1
  Filled 2018-06-09: qty 10
  Filled 2018-06-09: qty 1
  Filled 2018-06-09: qty 10

## 2018-06-09 MED ORDER — DIPHENHYDRAMINE HCL 50 MG PO CAPS
50.0000 mg | ORAL_CAPSULE | Freq: Once | ORAL | Status: AC
  Administered 2018-06-09: 50 mg via ORAL
  Filled 2018-06-09: qty 2
  Filled 2018-06-09: qty 1

## 2018-06-09 MED ORDER — LORAZEPAM 1 MG PO TABS
1.0000 mg | ORAL_TABLET | Freq: Two times a day (BID) | ORAL | Status: DC
  Administered 2018-06-09 – 2018-06-10 (×3): 1 mg via ORAL
  Filled 2018-06-09 (×4): qty 1

## 2018-06-09 MED ORDER — LORAZEPAM 1 MG PO TABS
1.0000 mg | ORAL_TABLET | Freq: Once | ORAL | Status: AC
  Administered 2018-06-09: 1 mg via ORAL

## 2018-06-09 MED ORDER — TRAZODONE HCL 50 MG PO TABS
50.0000 mg | ORAL_TABLET | Freq: Every evening | ORAL | Status: DC | PRN
  Administered 2018-06-09: 50 mg via ORAL
  Filled 2018-06-09: qty 1

## 2018-06-09 MED ORDER — CHLORPROMAZINE HCL 50 MG PO TABS
50.0000 mg | ORAL_TABLET | Freq: Once | ORAL | Status: AC
  Administered 2018-06-09: 50 mg via ORAL
  Filled 2018-06-09: qty 2
  Filled 2018-06-09: qty 1

## 2018-06-09 MED ORDER — LORAZEPAM 1 MG PO TABS: 1.0000 mg | ORAL_TABLET | Freq: Three times a day (TID) | ORAL | Status: DC
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
## 2018-06-09 NOTE — BHH Group Notes (Signed)
[facility] Group Notes:  (Nursing/MHT/Case Management/Adjunct)  Date:  06/09/2018  Time:  4:00 pm  Type of Therapy:  Psychoeducational Skills  Participation Level:  Active  Participation Quality:  Appropriate  Affect:  Appropriate  Cognitive:  Appropriate  Insight:  Appropriate  Engagement in Group:  Engaged  Modes of Intervention:  Education  Summary of Progress/Problems: Patient attended group and interacted appropriately.    Cammy Copa 06/09/2018, 6:48 PM

## 2018-06-09 NOTE — Progress Notes (Signed)
Patient ID: Dawn Foley, female   DOB: 1986-05-24, 32 y.o.   MRN: [id number]  Nursing Progress Note [id number]  Data: Patient presents with anxious, labile mood and med-seeking behaviors. Patient completed self-inventory sheet and rates depression, hopelessness, and anxiety 10,10,10 respectively. Patient rates their sleep and appetite as fair/fair respectively. Patient states goal for today is to "meet with doctor". Patient is seen attending groups and visible in the milieu. Patient currently denies SI/HI/AVH. Patient requests STD work-up be completed. MD notified.  Action: Patient is educated about and provided medication per provider's orders. Patient safety maintained with q15 min safety checks and frequent rounding. Low fall risk precautions in place. Emotional support given. 1:1 interaction and active listening provided. Patient encouraged to attend meals, groups, and work on treatment plan and goals. Labs, vital signs and patient behavior monitored throughout shift.   Response: Patient remains safe on the unit at this time and agrees to come to staff with any issues/concerns. Patient is interacting with peers appropriately on the unit. Will continue to support and monitor.

## 2018-06-09 NOTE — Progress Notes (Signed)
Recreation Therapy Notes  Date: 9.30.19 Time: 0930 Location: 300 Hall Dayroom  Group Topic: Stress Management  Goal Area(s) Addresses:  Patient will verbalize importance of using healthy stress management.  Patient will identify positive emotions associated with healthy stress management.   Intervention: Stress Management  Activity :  Guided Imagery.  LRT introduced the stress management technique of guided imagery.  LRT read a script that guided patients a walk through a wildlife sanctuary.  Patients were to listen and follow along as the script was read.  Education:  Stress Management, Discharge Planning.   Education Outcome: Acknowledges edcuation/In group clarification offered/Needs additional education  Clinical Observations/Feedback: Pt did not attend group.     Victorino Sparrow, LRT/CTRS         Ria Comment, Destyni Hoppel A 06/09/2018 11:40 AM

## 2018-06-09 NOTE — BHH Group Notes (Signed)
[facility] LCSW Group Therapy Note  Date/Time: 06/09/18, 1315  Type of Therapy and Topic:  Group Therapy:  Overcoming Obstacles  Participation Level:  Did not attend  Description of Group:    In this group patients will be encouraged to explore what they see as obstacles to their own wellness and recovery. They will be guided to discuss their thoughts, feelings, and behaviors related to these obstacles. The group will process together ways to cope with barriers, with attention given to specific choices patients can make. Each patient will be challenged to identify changes they are motivated to make in order to overcome their obstacles. This group will be process-oriented, with patients participating in exploration of their own experiences as well as giving and receiving support and challenge from other group members.  Therapeutic Goals: 1. Patient will identify personal and current obstacles as they relate to admission. 2. Patient will identify barriers that currently interfere with their wellness or overcoming obstacles.  3. Patient will identify feelings, thought process and behaviors related to these barriers. 4. Patient will identify two changes they are willing to make to overcome these obstacles:    Summary of Patient Progress      Therapeutic Modalities:   Cognitive Behavioral Therapy Solution Focused Therapy Motivational Interviewing Relapse Prevention Therapy  Lurline Idol, LCSW

## 2018-06-09 NOTE — Progress Notes (Signed)
Pt remains labile in behavior. Pt is argumentative with staff. Pt preoccupied with various somatic complaints and appears to be focus/seeking on receiving medications. Provider on call was notified. See new orders. Pt is a UR due to elopement risk. PRN trazodone and vistaril requested and given. Encouragement and support provided. Will continue with POC.

## 2018-06-09 NOTE — Progress Notes (Signed)
Pt attended morning group.

## 2018-06-09 NOTE — Progress Notes (Signed)
Patient ID: Dawn Foley, female   DOB: Oct 15, 1985, 32 y.o.   MRN: [id number] D: Assumed care patient @ 2330. Patient in bed sleeping. Respiration regular and unlabored. No sign of distress noted at this time A: 15 mins checks for safety. R: Patient remains safe.

## 2018-06-09 NOTE — Progress Notes (Signed)
Patient ID: Dawn Foley, female   DOB: [birth year], 32 y.o.   MRN: [id number]  MHT and another RN approached Probation officer at [employer]. Patient was standing at the main entrance of the adult unit crying and reporting to staff "I'm walking out this fucking door!" Patient was agitated and refused to leave the doorway. No visitors were allowed on/off the unit. AC and security notified. MD called and new orders received. Patient provided 1 mg of Ativan PO at [place] and was provided bedtime dose of 300 mg of Seroquel early per MD order. Patient agreeable to taking medications but refused to engage with writer therapeutically or return to her room to rest. Patient reported she was upset that a friend didn't come visit her tonight. Patient endorsed plan to elope out the door and "run into traffic to end it all". Patient returned to the main entrance.  MD called to notify staff to move patient to 500 hall due to elopement risk. Show of support was called. Patient walked to 500 hall on her own. Patient walked into the quiet room and was crying on the bed but safe on the unit. MHT supporting patient 1:1. On-coming shift was notified.

## 2018-06-09 NOTE — Progress Notes (Signed)
   06/09/18 2100  Dynamic Appraisal of Situational Aggression  Irritability 1  Impulsivity 1  Unwillingness to Follow Directions 0  Sensitivity to Perceived Provocation 1  Easily Angered When Requests are Denied 1  Negative Attitudes 1  Verbal Threats 0  Total DASA Score 5  Final Risk Rating High Risk  Record of Aggression in the last 24 hours  Physical Aggression against OBJECTS No  Verbal Aggression against OTHER PEOPLE Yes  Physical Aggression against OTHER PEOPLE No

## 2018-06-10 DIAGNOSIS — E039 Hypothyroidism, unspecified: Secondary | ICD-10-CM

## 2018-06-10 LAB — GC/CHLAMYDIA PROBE AMP (~~LOC~~) NOT AT ARMC
Chlamydia: NEGATIVE
Neisseria Gonorrhea: NEGATIVE

## 2018-06-10 LAB — RPR: RPR Ser Ql: NONREACTIVE

## 2018-06-10 MED ORDER — QUETIAPINE FUMARATE 50 MG PO TABS
50.0000 mg | ORAL_TABLET | Freq: Two times a day (BID) | ORAL | Status: DC
  Administered 2018-06-10 – 2018-06-11 (×2): 50 mg via ORAL
  Filled 2018-06-10 (×6): qty 1

## 2018-06-10 MED ORDER — LORAZEPAM 0.5 MG PO TABS
0.5000 mg | ORAL_TABLET | Freq: Three times a day (TID) | ORAL | Status: DC
  Administered 2018-06-10 – 2018-06-11 (×3): 0.5 mg via ORAL
  Filled 2018-06-10 (×3): qty 1

## 2018-06-10 MED ORDER — CHLORPROMAZINE HCL 25 MG PO TABS
25.0000 mg | ORAL_TABLET | Freq: Four times a day (QID) | ORAL | Status: DC | PRN
  Administered 2018-06-10 – 2018-06-12 (×6): 25 mg via ORAL
  Filled 2018-06-10 (×4): qty 1
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
## 2018-06-10 NOTE — Progress Notes (Signed)
Adult Psychoeducational Group Note  Date:  06/10/2018 Time:  9:41 PM  Group Topic/Focus:  Wrap-Up Group:   The focus of this group is to help patients review their daily goal of treatment and discuss progress on daily workbooks.  Participation Level:  Active  Participation Quality:  Appropriate  Affect:  Appropriate  Cognitive:  Appropriate  Insight: Appropriate  Engagement in Group:  Engaged  Modes of Intervention:  Discussion  Additional Comments:  Pt stated her goal for today was to talk with her doctor about medication issues. Pt stated she was able to accomplished her goal today. Pt rated her over all day at 4 out of 10. Pt stated tomorrow goal is to attend all groups and program.  Candy Sledge 06/10/2018, 9:41 PM

## 2018-06-10 NOTE — B[facility] Group Notes (Signed)
LCSW Group Therapy Note  06/10/2018 1:15pm  Type of Therapy/Topic:  Group Therapy:  Balance in Life  Participation Level:  Minimal  Description of Group:    This group will address the concept of balance and how it feels and looks when one is unbalanced. Patients will be encouraged to process areas in their lives that are out of balance and identify reasons for remaining unbalanced. Facilitators will guide patients in utilizing problem-solving interventions to address and correct the stressor making their life unbalanced. Understanding and applying boundaries will be explored and addressed for obtaining and maintaining a balanced life. Patients will be encouraged to explore ways to assertively make their unbalanced needs known to significant others in their lives, using other group members and facilitator for support and feedback.  Therapeutic Goals: 1. Patient will identify two or more emotions or situations they have that consume much of in their lives. 2. Patient will identify signs/triggers that life has become out of balance:  3. Patient will identify two ways to set boundaries in order to achieve balance in their lives:  4. Patient will demonstrate ability to communicate their needs through discussion and/or role plays  Summary of Patient Progress:  Not present initially.  Joined after 8 minutes or so.  Agitated, angry, unable to calm self or respond to social cues.  Dropping the f bomb left and right about inability to get medication she needs.  Was able to settle briefly, but became agitated again soon, and when asked to be quiet or leave, chose to leave.   Therapeutic Modalities:   Cognitive Behavioral Therapy Solution-Focused Therapy Assertiveness Training  Trish Mage, [place] 06/10/2018 2:20 PM

## 2018-06-10 NOTE — Progress Notes (Signed)
Pt continue to be very irritable, liable,demanding and poor boundary. Pt's constantly asking for medication. Pt stated she can not concentrate, can not stay in one. Pt is very disruptive in the milieu, interruptive during group. Pt stated she feel confined and would like to get out of here. Dr notified and talked with the pt, Thorazine 25 mg PO ordered and administered. Will continue to monitor.

## 2018-06-10 NOTE — Progress Notes (Signed)
Recreation Therapy Notes  Date: 10.1.19 Time: 1000 Location: 500 Hall Dayroom  Group Topic: Wellness  Goal Area(s) Addresses:  Patient will define components of whole wellness. Patient will verbalize benefit of whole wellness.  Behavioral Response: None  Intervention:  Exercise, music  Activity: Exercise.  LRT introduced exercise as a component of wellness.  LRT lead the patients in a series of stretches.  Each patient would then chose an exercise to lead the group in.  The group will complete 3 rounds of exercises (more or less depending on the size of the group) before completing a cool down.    Education: Wellness, Dentist.   Education Outcome: Acknowledges education/In group clarification offered/Needs additional education.   Clinical Observations/Feedback: Pt attended group but did not participate.  Pt was flat but appropriate during group session.    Victorino Sparrow, LRT/CTRS     Ria Comment, Danen Lapaglia A 06/10/2018 11:10 AM

## 2018-06-11 MED ORDER — QUETIAPINE FUMARATE 100 MG PO TABS
100.0000 mg | ORAL_TABLET | Freq: Two times a day (BID) | ORAL | Status: DC
  Administered 2018-06-11 – 2018-06-13 (×4): 100 mg via ORAL
  Filled 2018-06-11: qty 1
  Filled 2018-06-11: qty 14
  Filled 2018-06-11 (×3): qty 1
  Filled 2018-06-11: qty 14
  Filled 2018-06-11 (×2): qty 1

## 2018-06-11 MED ORDER — LORAZEPAM 0.5 MG PO TABS
0.5000 mg | ORAL_TABLET | Freq: Two times a day (BID) | ORAL | Status: DC
  Administered 2018-06-12 – 2018-06-13 (×3): 0.5 mg via ORAL
  Filled 2018-06-11 (×3): qty 1

## 2018-06-11 MED ORDER — SALINE SPRAY 0.65 % NA SOLN
2.0000 | NASAL | Status: DC | PRN
  Administered 2018-06-12 – 2018-06-13 (×2): 2 via NASAL
  Filled 2018-06-11: qty 44
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
## 2018-06-11 NOTE — [facility] Group Notes (Signed)
LCSW Group Therapy Note  06/11/2018 1:15pm  Type of Therapy/Topic:  Group Therapy:  Emotion Regulation  Participation Level:  Active   Description of Group:   The purpose of this group is to assist patients in learning to regulate negative emotions and experience positive emotions. Patients will be guided to discuss ways in which they have been vulnerable to their negative emotions. These vulnerabilities will be juxtaposed with experiences of positive emotions or situations, and patients will be challenged to use positive emotions to combat negative ones. Special emphasis will be placed on coping with negative emotions in conflict situations, and patients will process healthy conflict resolution skills.  Therapeutic Goals: 1. Patient will identify two positive emotions or experiences to reflect on in order to balance out negative emotions 2. Patient will label two or more emotions that they find the most difficult to experience 3. Patient will demonstrate positive conflict resolution skills through discussion and/or role plays  Summary of Patient Progress:  Dawn Foley attended the entire session. She was engaged with the activity and supported the group by brainstorming to name feelings. Dawn Foley described recent feelings of "anger" and "sadness"     Therapeutic Modalities:   Cognitive Behavioral Therapy Feelings Identification [place], LCSW 06/11/2018 10:01 AM

## 2018-06-11 NOTE — Progress Notes (Signed)
Recreation Therapy Notes  Date: 10.2.19 Time: 1000 Location: [address] Dayroom  Group Topic: Coping Skills  Goal Area(s) Addresses:  Pt will be able to identify positive coping skills. Pt will be able to identify benefits of using coping skills post d/c.  Behavioral Response: Engaged  Intervention:  Worksheet, magazines, scissors, glue sticks    Activity:  Museum/gallery conservator.  Patients were to identify at least two coping skills for diversions, social, cognitive, tension releasers and physical.  Education: Coping Skills, Discharge Planning.   Education Outcome: Acknowledges understanding/In group clarification offered/Needs additional education.   Clinical Observations/Feedback: Pt was active and focused on active.  Pt identified her coping skills as: diversions-make up, men; social- love, family time; cognitive- men, food; tension releasers- sex, music and food and physical- vitamins and exercise.    Victorino Sparrow, LRT/CTRS         Victorino Sparrow A 06/11/2018 11:52 AM

## 2018-06-11 NOTE — Tx Team (Signed)
Interdisciplinary Treatment and Diagnostic Plan Update  06/11/2018 Time of Session: 12:50 PM  MAKINZI PRIEUR MRN: [id number]  Principal Diagnosis: Severe recurrent major depression without psychotic features [facility])  Secondary Diagnoses: Principal Problem:   Severe recurrent major depression without psychotic features ([place]) Active Problems:   Alcohol dependence with alcohol-induced mood disorder ([place])   Uncomplicated alcohol withdrawal without perceptual disturbances ([facility])   Posttraumatic stress disorder   Opioid use disorder, moderate, dependence ([place])   Current Medications:  Current Facility-Administered Medications  Medication Dose Route Frequency Provider Last Rate Last Dose  . acamprosate (CAMPRAL) tablet 666 mg  666 mg Oral TID WC Money, Lowry Ram, FNP   666 mg at 06/11/18 0555  . alum & mag hydroxide-simeth (MAALOX/MYLANTA) 200-200-20 MG/5ML suspension 30 mL  30 mL Oral Q4H PRN Lindon Romp A, NP      . atorvastatin (LIPITOR) tablet 10 mg  10 mg Oral q1800 Money, Lowry Ram, FNP   10 mg at 06/10/18 1706  . chlorproMAZINE (THORAZINE) tablet 25 mg  25 mg Oral QID PRN Sharma Covert, MD   25 mg at 06/11/18 0749  . clotrimazole (GYNE-LOTRIMIN) vaginal cream 1 Applicatorful  1 Applicatorful Vaginal QHS Laverle Hobby, PA-C   1 Applicatorful at 03/00/92 2104  . dolutegravir (TIVICAY) tablet 50 mg  50 mg Oral Daily Sharma Covert, MD   50 mg at 06/11/18 0750  . emtricitabine-tenofovir AF (DESCOVY) 200-25 MG per tablet 1 tablet  1 tablet Oral Daily Sharma Covert, MD   1 tablet at 06/11/18 0750  . folic acid (FOLVITE) tablet 1 mg  1 mg Oral Daily Sharma Covert, MD   1 mg at 06/11/18 0750  . gabapentin (NEURONTIN) capsule 400 mg  400 mg Oral TID Cobos, Myer Peer, MD   400 mg at 06/11/18 0750  . hydrocerin (EUCERIN) cream   Topical BID Cobos, Fernando A, MD      . hydrOXYzine (ATARAX/VISTARIL) tablet 25 mg  25 mg Oral Q6H PRN Cobos, Myer Peer, MD   25 mg at 06/11/18 1147   . ibuprofen (ADVIL,MOTRIN) tablet 600 mg  600 mg Oral Q6H PRN Sharma Covert, MD   600 mg at 06/11/18 0756  . levETIRAcetam (KEPPRA) tablet 500 mg  500 mg Oral BID Sharma Covert, MD   500 mg at 06/11/18 0749  . levothyroxine (SYNTHROID, LEVOTHROID) tablet 75 mcg  75 mcg Oral QAC breakfast Lindon Romp A, NP   75 mcg at 06/11/18 0555  . loratadine (CLARITIN) tablet 10 mg  10 mg Oral Daily Nanci Pina, FNP   10 mg at 06/11/18 0751  . LORazepam (ATIVAN) tablet 0.5 mg  0.5 mg Oral TID Cobos, Myer Peer, MD   0.5 mg at 06/11/18 0752  . magnesium hydroxide (MILK OF MAGNESIA) suspension 30 mL  30 mL Oral Daily PRN Lindon Romp A, NP      . menthol-cetylpyridinium (CEPACOL) lozenge 3 mg  1 lozenge Oral PRN Nanci Pina, FNP   3 mg at 06/08/18 1715  . naphazoline-glycerin (CLEAR EYES REDNESS) ophth solution 1-2 drop  1-2 drop Both Eyes QID PRN Sharma Covert, MD   2 drop at 06/08/18 1709  . nicotine polacrilex (NICORETTE) gum 2 mg  2 mg Oral PRN Sharma Covert, MD   2 mg at 06/10/18 1021  . pantoprazole (PROTONIX) EC tablet 40 mg  40 mg Oral Daily Lindon Romp A, NP   40 mg at 06/11/18 0750  . QUEtiapine (  SEROQUEL) tablet 300 mg  300 mg Oral QHS Money, Lowry Ram, FNP   300 mg at 06/10/18 2102  . QUEtiapine (SEROQUEL) tablet 50 mg  50 mg Oral BID Cobos, Myer Peer, MD   50 mg at 06/11/18 0750  . thiamine (VITAMIN B-1) tablet 100 mg  100 mg Oral Daily Lindon Romp A, NP   100 mg at 06/11/18 0750  . witch hazel-glycerin (TUCKS) pad   Topical PRN Derrill Center, NP   1 application at 70/17/79 1436    PTA Medications: Medications Prior to Admission  Medication Sig Dispense Refill Last Dose  . atorvastatin (LIPITOR) 10 MG tablet Take 1 tablet (10 mg total) by mouth daily at 6 PM. 30 tablet 1   . calcium citrate (CALCITRATE - DOSED IN MG ELEMENTAL CALCIUM) 950 MG tablet Take 1 tablet (200 mg of elemental calcium total) 2 (two) times daily by mouth. 90 tablet 2   . cholecalciferol  5000 units TABS Take 1 tablet (5,000 Units total) daily by mouth. 30 tablet 3   . gabapentin (NEURONTIN) 300 MG capsule Take 2 capsules (600 mg total) by mouth 3 (three) times daily. 90 capsule 1   . levETIRAcetam (KEPPRA) 500 MG tablet Take 1 tablet (500 mg total) by mouth 2 (two) times daily. 60 tablet 1   . levothyroxine (SYNTHROID, LEVOTHROID) 75 MCG tablet Take 1 tablet (75 mcg total) by mouth daily before breakfast. 30 tablet 1   . Multiple Vitamin (MULTIVITAMIN WITH MINERALS) TABS tablet Take 1 tablet by mouth daily. Vitamin supplement   07/17/2017 at Unknown time  . naphazoline-glycerin (CLEAR EYES) 0.012-0.2 % SOLN Place 1-2 drops into both eyes 4 (four) times daily as needed for irritation.  0 07/17/2017 at Unknown time  . pantoprazole (PROTONIX) 40 MG tablet Take 1 tablet (40 mg total) by mouth daily. 30 tablet 1   . QUEtiapine (SEROQUEL) 200 MG tablet Take 1 tablet (200 mg total) by mouth at bedtime. 30 tablet 1   . traZODone (DESYREL) 100 MG tablet Take 1 tablet (100 mg total) by mouth at bedtime as needed for sleep. 30 tablet 1   . vitamin C (VITAMIN C) 500 MG tablet Take 1 tablet (500 mg total) daily by mouth. 30 tablet 1     Patient Stressors: Financial difficulties Substance abuse Traumatic event  Patient Strengths: Average or above average intelligence Communication skills General fund of knowledge Supportive family/friends  Treatment Modalities: Medication Management, Group therapy, Case management,  1 to 1 session with clinician, Psychoeducation, Recreational therapy.   Physician Treatment Plan for Primary Diagnosis: Severe recurrent major depression without psychotic features ([place]) Long Term Goal(s): Improvement in symptoms so as ready for discharge  Short Term Goals: Ability to identify changes in lifestyle to reduce recurrence of condition will improve Ability to verbalize feelings will improve Ability to disclose and discuss suicidal ideas Ability to demonstrate  self-control will improve Ability to identify and develop effective coping behaviors will improve Ability to maintain clinical measurements within normal limits will improve Compliance with prescribed medications will improve Ability to identify triggers associated with substance abuse/mental health issues will improve Ability to identify changes in lifestyle to reduce recurrence of condition will improve Ability to verbalize feelings will improve Ability to disclose and discuss suicidal ideas Ability to demonstrate self-control will improve Ability to identify and develop effective coping behaviors will improve Ability to maintain clinical measurements within normal limits will improve Compliance with prescribed medications will improve Ability to identify triggers associated with  substance abuse/mental health issues will improve  Medication Management: Evaluate patient's response, side effects, and tolerance of medication regimen.  Therapeutic Interventions: 1 to 1 sessions, Unit Group sessions and Medication administration.  Evaluation of Outcomes: Progressing   10/2: Patient reports increased sense of irritability/anxiety/agitation.  States she feels she still has some residual alcohol withdrawal symptoms.Was transferred to [address] yesterday evening after she became physically agitated and stood by exit door, refusing to leave doorway, attempting to elope, stating that she plan to "run into traffic". Continue  Campral 666 mg TID for alcohol cravings Continue Ativan taper, decreased to 0.5 mg 3 times daily, hold if sedated Continue  Lipitor 10 mg QDAY for hyperlipidemia Continue  Gabapentin 400 mg TIDfor anxiety Continue Synthroid 75 micrograms QDAY for Hypothyroidism Continue Keppra 500 mgrs BID for history of seizures  Continue Vistaril 25  mg  Q 6 hours PRN  for anxiety  Increase Seroquel to 50 mg twice daily and 300 mgrs QHS for mood disorder  D/C Trazodone  Continue Thorazine  at 25 mg every 6 hours as needed for agitation.  Physician Treatment Plan for Secondary Diagnosis: Principal Problem:   Severe recurrent major depression without psychotic features ([place]) Active Problems:   Alcohol dependence with alcohol-induced mood disorder ([facility])   Uncomplicated alcohol withdrawal without perceptual disturbances ([facility])   Posttraumatic stress disorder   Opioid use disorder, moderate, dependence ([place])   Long Term Goal(s): Improvement in symptoms so as ready for discharge  Short Term Goals: Ability to identify changes in lifestyle to reduce recurrence of condition will improve Ability to verbalize feelings will improve Ability to disclose and discuss suicidal ideas Ability to demonstrate self-control will improve Ability to identify and develop effective coping behaviors will improve Ability to maintain clinical measurements within normal limits will improve Compliance with prescribed medications will improve Ability to identify triggers associated with substance abuse/mental health issues will improve Ability to identify changes in lifestyle to reduce recurrence of condition will improve Ability to verbalize feelings will improve Ability to disclose and discuss suicidal ideas Ability to demonstrate self-control will improve Ability to identify and develop effective coping behaviors will improve Ability to maintain clinical measurements within normal limits will improve Compliance with prescribed medications will improve Ability to identify triggers associated with substance abuse/mental health issues will improve  Medication Management: Evaluate patient's response, side effects, and tolerance of medication regimen.  Therapeutic Interventions: 1 to 1 sessions, Unit Group sessions and Medication administration.  Evaluation of Outcomes: Progressing   RN Treatment Plan for Primary Diagnosis: Severe recurrent major depression without psychotic features ([place]) Long Term  Goal(s): Knowledge of disease and therapeutic regimen to maintain health will improve  Short Term Goals: Ability to identify and develop effective coping behaviors will improve and Compliance with prescribed medications will improve  Medication Management: RN will administer medications as ordered by provider, will assess and evaluate patient's response and provide education to patient for prescribed medication. RN will report any adverse and/or side effects to prescribing provider.  Therapeutic Interventions: 1 on 1 counseling sessions, Psychoeducation, Medication administration, Evaluate responses to treatment, Monitor vital signs and CBGs as ordered, Perform/monitor CIWA, COWS, AIMS and Fall Risk screenings as ordered, Perform wound care treatments as ordered.  Evaluation of Outcomes: Progressing   LCSW Treatment Plan for Primary Diagnosis: Severe recurrent major depression without psychotic features ([place]) Long Term Goal(s): Safe transition to appropriate next level of care at discharge, Engage patient in therapeutic group addressing interpersonal concerns.  Short Term Goals:  Engage patient in aftercare planning with referrals and resources  Therapeutic Interventions: Assess for all discharge needs, 1 to 1 time with Social worker, Explore available resources and support systems, Assess for adequacy in community support network, Educate family and significant other(s) on suicide prevention, Complete Psychosocial Assessment, Interpersonal group therapy.  Evaluation of Outcomes: Met  Plan A is [facility], Plan B is go with ex and follow up community    Progress in Treatment: Attending groups: Yes Participating in groups: Yes Taking medication as prescribed: Yes Toleration medication: Yes, no side effects reported at this time Family/Significant other contact made: No Patient understands diagnosis: No Limited insight Discussing patient identified problems/goals with staff: Yes Medical problems  stabilized or resolved: Yes Denies suicidal/homicidal ideation: Yes Issues/concerns per patient self-inventory: None Other: N/A  New problem(s) identified: None identified at this time.   New Short Term/Long Term Goal(s):   Discharge Plan or Barriers:   Reason for Continuation of Hospitalization: Anxiety Delusions  Depression Mood instability Medication stabilization Suicidal ideation Withdrawal symptoms  Estimated Length of Stay: 10/7  Attendees: Patient: 06/11/2018  12:50 PM  Physician: Neita Garnet, MD 06/11/2018  12:50 PM  Nursing: Sena Hitch, RN 06/11/2018  12:50 PM  RN Care Manager: Lars Pinks, RN 06/11/2018  12:50 PM  Social Worker: Ripley Fraise 06/11/2018  12:50 PM  Recreational Therapist: Winfield Cunas 06/11/2018  12:50 PM  Other: Norberto Sorenson 06/11/2018  12:50 PM  Other:  06/11/2018  12:50 PM    Scribe for Treatment Team:  Roque Lias LCSW 06/11/2018 12:50 PM

## 2018-06-11 NOTE — Progress Notes (Signed)
D: Pt was loud and verbally aggressive during the initial interaction with the Probation officer. Pt became upset because the mht removed her air fresher spray from her room. Then pt began complaining of all situations she didn't like in the hosp. When asked during the assessment if she has visual and or auditory hallucinations pt stated, "I just hear stuff. I don't see stuff". Pt has no other questions or concerns.    A:  Support and encouragement was offered. 15 min checks continued for safety.  R: Pt remains safe.

## 2018-06-11 NOTE — [facility] Group Notes (Signed)
Adult Psychoeducational Group Note  Date:  06/11/2018 Time:  9:18 AM  Group Topic/Focus:  Goals Group:   The focus of this group is to help patients establish daily goals to achieve during treatment and discuss how the patient can incorporate goal setting into their daily lives to aide in recovery.  Participation Level:  Active  Participation Quality:  Appropriate  Affect:  Appropriate  Cognitive:  Alert  Insight: Appropriate  Engagement in Group:  Engaged  Modes of Intervention:  Orientation  Additional Comments:  Pt attened orientation/goals group facilitated by MHT Janette C.  Huel Cote 06/11/2018, 9:18 AM

## 2018-06-12 ENCOUNTER — Encounter (HOSPITAL_COMMUNITY): Payer: Self-pay

## 2018-06-12 DIAGNOSIS — F1099 Alcohol use, unspecified with unspecified alcohol-induced disorder: Secondary | ICD-10-CM

## 2018-06-12 NOTE — [facility] Group Notes (Signed)
Adult Psychoeducational Group Note  Date:  06/12/2018 Time:  9:06 AM  Group Topic/Focus:  Goals Group:   The focus of this group is to help patients establish daily goals to achieve during treatment and discuss how the patient can incorporate goal setting into their daily lives to aide in recovery.  Participation Level:  Active  Participation Quality:  Appropriate  Affect:  Appropriate  Cognitive:  Alert  Insight: Appropriate  Engagement in Group:  Engaged  Modes of Intervention:  Orientation  Additional Comments:  Pt goal for today is to go home.   Huel Cote 06/12/2018, 9:06 AM

## 2018-06-12 NOTE — Progress Notes (Signed)
Recreation Therapy Notes  Date: 10.3.19 Time: 1000 Location: [address] Dayroom  Group Topic: Anxiety  Goal Area(s) Addresses:  Patient will identify triggers to anxiety. Patient will identify coping skills for anxiety. Patient will identify benefits of using coping skills post d/c.  Behavioral Response: Engaged  Intervention: Worksheet  Activity: Introduction to Anxiety.  Patients were to identify what triggers their anxiety, physical symptoms they experience, the thoughts they have when anxious and coping skills they use to deal with anxiety.   Education: Acknowledges education; In group clarification offered; Needs additional education  Clinical Observations/Feedback: Pt stated her anxiety is triggered by "pressure, bills, being unorganized, being rushed and not knowing".  Pt stated she deals with her triggers by "pacing, crying, twitching, bite nails and stomach aches".  Pt expressed she has thoughts of feeling sad, scared and try to calm down.  Pt stated her coping skills were drinking or talk to someone, be alone, loud music, cry, long walks, write and draw/color.     Victorino Sparrow, LRT/CTRS      Victorino Sparrow A 06/12/2018 12:25 PM
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
## 2018-06-12 NOTE — [facility] Group Notes (Signed)
Pt attended group but opted not to share in group.

## 2018-06-12 NOTE — Progress Notes (Signed)
D: Pt was less anxious, irritable and demanding today than she was in previous days. Pt informed the writer that the dr was "supposed" to have been writing an order for "water pills and nasal spray". Pt has pitting edema in bilateral legs and ankles. Pt has no questions or concerns.    A: Received an order for saline nasal spray. Informed to speak with her dr tomorrow about the edema. Informed that writer would pass it on to day shift relief. Support and encouragement was offered. 15 min checks continued for safety.  R: Pt remains safe.

## 2018-06-13 ENCOUNTER — Ambulatory Visit (HOSPITAL_COMMUNITY): Payer: Federal, State, Local not specified - Other

## 2018-06-13 DIAGNOSIS — M7989 Other specified soft tissue disorders: Secondary | ICD-10-CM

## 2018-06-13 MED ORDER — ACAMPROSATE CALCIUM 333 MG PO TBEC: 666.0000 mg | DELAYED_RELEASE_TABLET | Freq: Three times a day (TID) | ORAL | 0 refills | Status: DC

## 2018-06-13 MED ORDER — QUETIAPINE FUMARATE 100 MG PO TABS: 100.0000 mg | ORAL_TABLET | Freq: Two times a day (BID) | ORAL | 0 refills | Status: DC

## 2018-06-13 MED ORDER — EMTRICITABINE-TENOFOVIR AF 200-25 MG PO TABS: 1.0000 | ORAL_TABLET | Freq: Every day | ORAL | 0 refills | Status: DC

## 2018-06-13 MED ORDER — LORAZEPAM 0.5 MG PO TABS: 0.5000 mg | ORAL_TABLET | Freq: Two times a day (BID) | ORAL | 0 refills | Status: DC

## 2018-06-13 MED ORDER — HYDROXYZINE HCL 25 MG PO TABS: 25.0000 mg | ORAL_TABLET | Freq: Four times a day (QID) | ORAL | 0 refills | Status: DC | PRN

## 2018-06-13 MED ORDER — ATORVASTATIN CALCIUM 10 MG PO TABS: 10.0000 mg | ORAL_TABLET | Freq: Every day | ORAL | 0 refills | Status: DC

## 2018-06-13 MED ORDER — QUETIAPINE FUMARATE 300 MG PO TABS: 300.0000 mg | ORAL_TABLET | Freq: Every day | ORAL | 0 refills | Status: DC

## 2018-06-13 MED ORDER — DOLUTEGRAVIR SODIUM 50 MG PO TABS: 50.0000 mg | ORAL_TABLET | Freq: Every day | ORAL | 0 refills | Status: DC

## 2018-06-13 MED ORDER — GABAPENTIN 400 MG PO CAPS: 400.0000 mg | ORAL_CAPSULE | Freq: Three times a day (TID) | ORAL | 0 refills | Status: DC

## 2018-06-13 NOTE — Progress Notes (Signed)
  [facility] Adult Case Management Discharge Plan :  Will you be returning to the same living situation after discharge:  No. Going to shelter in [place]. At discharge, do you have transportation home?: No. Bus passes provided.  Do you have the ability to pay for your medications: No. Will work with [employer].   Release of information consent forms completed and in the chart;  Patient's signature needed at discharge.  Patient to Follow up at: [facility] Go in 3 day(s).   Why:  Please attend a walk in appt Monday-Friday between 830am-1030am.  Please bring photo ID and your hospital discharge paperwork. Contact information: [place] [address] [phone]           Next level of care provider has access to [place] and Suicide Prevention discussed: No.Attempts made.  SPE completed with pt.  Have you used any form of tobacco in the last 30 days? (Cigarettes, Smokeless Tobacco, Cigars, and/or Pipes): Yes  Has patient been referred to the Quitline?: Patient refused referral  Patient has been referred for addiction treatment: Pt. refused referral  Joanne Chars, [place] 06/13/2018, 1:14 PM

## 2018-06-13 NOTE — Progress Notes (Signed)
Recreation Therapy Notes  Date: 10.4.19 Time: 1000 Location: 500 Hall Dayroom  Group Topic: Communication, Team Building, Problem Solving  Goal Area(s) Addresses:  Patient will effectively work with peer towards shared goal.  Patient will identify skill used to make activity successful.  Patient will identify how skills used during activity can be used to reach post d/c goals.   Behavioral Response: Engaged  Intervention: STEM Activity   Activity: Metallurgist. In teams, patients were asked to build the tallest freestanding tower possible out of 15 pipe cleaners. Systematically resources were removed, for example patient ability to use both hands and patient ability to verbally communicate.    Education: Education officer, community, Dentist.   Education Outcome: Acknowledges education/In group clarification offered/Needs additional education.   Clinical Observations/Feedback: Pt arrived late to group and worked by herself to complete the tower.  Pt stated she has a good family but they want her to prove she has changed.  Pt admitted she has done some things to break the trust of her family and expressed they were not wrong for wanting her to prove some things to them to regain their trust.  Pt expressed that she is stubborn and doesn't feel she needs to prove anything to them.  Pt conceited she needs find a middle ground with her and her family because their support "means a lot to me".     Victorino Sparrow, LRT/CTRS     Ria Comment, Quinnie Barcelo A 06/13/2018 11:03 AM

## 2018-06-13 NOTE — Progress Notes (Signed)
D: Pt A & O X 4. Denies SI, HI, AVH and pain at this time. D/C home as ordered. Bus pass given X2 ([place] & Part).  A: D/C instructions reviewed with pt including prescriptions, medication samples and follow up appointment; compliance encouraged. All belongings from locker #16 given to pt at time of departure. Scheduled and PRN medications given with verbal education and effects monitored. Safety checks maintained without incident till time of d/c.  R: Pt receptive to care. Compliant with medications when offered. Denies adverse drug reactions when assessed. Verbalized understanding related to d/c instructions. Signed belonging sheet in agreement with items received from locker. Ambulatory with a steady gait. Appears to be in no physical distress at time of departure.
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
## 2018-06-13 NOTE — Progress Notes (Signed)
CSW spoke with pt in AM and confirmed plan for pt to discharge to [facility] and follow up at [facility].  Pt reports her boyfriend will be picking her up.  1300: Pt called to speak to pt again.  Plan with boyfriend has changed as boyfriend is being difficult.  Pt reports she is going to a shelter in [place] and needs to be there at 4pm.  [facility] bus leaves at 1525.  Follow up changed to [facility].  Pt declines for CSW to make appt as she wants to be able to walk in when it is best with her schedule.   Winferd Humphrey, MSW, LCSW Clinical Social Worker 06/13/2018 1:12 PM

## 2018-06-13 NOTE — Plan of Care (Signed)
  Problem: Activity: Goal: Interest or engagement in activities will improve Outcome: Progressing   Problem: Coping: Goal: Ability to verbalize frustrations and anger appropriately will improve Outcome: Progressing   Problem: Self-Concept: Goal: Ability to disclose and discuss suicidal ideas will improve Outcome: Progressing

## 2018-06-13 NOTE — Progress Notes (Signed)
Right lower extremity venous duplex has been completed. Negative for DVT.  Results were given to [facility] at Dr. Parke Poisson' office.  06/13/18 10:08 AM Dawn Foley RVT

## 2018-06-13 NOTE — Progress Notes (Signed)
DAR NOTE: Patient presents with anxious affect and depressed mood. Pt spent more of evening in the dayroom. Pt complained of N/V, pt demanded her medications stating she not taken any of her medications. Pt was told medication are not due until 10 pm. Pt became irritable and verbally abusive towards staff. Pt offered ginger al , refused stated " I don't drink gingeral" Denies SI, auditory and visual hallucinations.  Maintained on routine safety checks.  Medications given as prescribed.  Support and encouragement offered as needed.  Will continue to monitor.
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
## 2018-08-27 ENCOUNTER — Encounter: Payer: Self-pay | Admitting: Psychiatry

## 2018-08-28 ENCOUNTER — Other Ambulatory Visit: Payer: Self-pay

## 2018-08-28 ENCOUNTER — Inpatient Hospital Stay
Admission: AD | Admit: 2018-08-28 | Discharge: 2018-09-02 | DRG: 885 | Disposition: A | Discharge status: Home / Self Care | Payer: No Typology Code available for payment source | Source: Other Acute Inpatient Hospital | Attending: Psychiatry | Admitting: Psychiatry

## 2018-08-28 DIAGNOSIS — F1020 Alcohol dependence, uncomplicated: Secondary | ICD-10-CM | POA: Diagnosis not present

## 2018-08-28 DIAGNOSIS — R45850 Homicidal ideations: Secondary | ICD-10-CM | POA: Diagnosis present

## 2018-08-28 DIAGNOSIS — K219 Gastro-esophageal reflux disease without esophagitis: Secondary | ICD-10-CM | POA: Diagnosis present

## 2018-08-28 DIAGNOSIS — Z811 Family history of alcohol abuse and dependence: Secondary | ICD-10-CM

## 2018-08-28 DIAGNOSIS — M792 Neuralgia and neuritis, unspecified: Secondary | ICD-10-CM | POA: Diagnosis present

## 2018-08-28 DIAGNOSIS — E039 Hypothyroidism, unspecified: Secondary | ICD-10-CM | POA: Diagnosis present

## 2018-08-28 DIAGNOSIS — X58XXXS Exposure to other specified factors, sequela: Secondary | ICD-10-CM | POA: Diagnosis present

## 2018-08-28 DIAGNOSIS — R45851 Suicidal ideations: Secondary | ICD-10-CM | POA: Diagnosis present

## 2018-08-28 DIAGNOSIS — G40909 Epilepsy, unspecified, not intractable, without status epilepticus: Secondary | ICD-10-CM | POA: Diagnosis present

## 2018-08-28 DIAGNOSIS — Z560 Unemployment, unspecified: Secondary | ICD-10-CM | POA: Diagnosis not present

## 2018-08-28 DIAGNOSIS — T1490XS Injury, unspecified, sequela: Secondary | ICD-10-CM

## 2018-08-28 DIAGNOSIS — F17210 Nicotine dependence, cigarettes, uncomplicated: Secondary | ICD-10-CM | POA: Diagnosis present

## 2018-08-28 DIAGNOSIS — Z79899 Other long term (current) drug therapy: Secondary | ICD-10-CM | POA: Diagnosis not present

## 2018-08-28 DIAGNOSIS — F10939 Alcohol use, unspecified with withdrawal, unspecified: Secondary | ICD-10-CM | POA: Diagnosis present

## 2018-08-28 DIAGNOSIS — Z888 Allergy status to other drugs, medicaments and biological substances status: Secondary | ICD-10-CM

## 2018-08-28 DIAGNOSIS — Z818 Family history of other mental and behavioral disorders: Secondary | ICD-10-CM

## 2018-08-28 DIAGNOSIS — F332 Major depressive disorder, recurrent severe without psychotic features: Secondary | ICD-10-CM | POA: Diagnosis present

## 2018-08-28 DIAGNOSIS — F419 Anxiety disorder, unspecified: Secondary | ICD-10-CM | POA: Diagnosis present

## 2018-08-28 DIAGNOSIS — Z79890 Hormone replacement therapy (postmenopausal): Secondary | ICD-10-CM

## 2018-08-28 DIAGNOSIS — R682 Dry mouth, unspecified: Secondary | ICD-10-CM | POA: Diagnosis not present

## 2018-08-28 DIAGNOSIS — E785 Hyperlipidemia, unspecified: Secondary | ICD-10-CM | POA: Diagnosis present

## 2018-08-28 DIAGNOSIS — Z8349 Family history of other endocrine, nutritional and metabolic diseases: Secondary | ICD-10-CM

## 2018-08-28 DIAGNOSIS — F10239 Alcohol dependence with withdrawal, unspecified: Secondary | ICD-10-CM | POA: Diagnosis present

## 2018-08-28 DIAGNOSIS — F17200 Nicotine dependence, unspecified, uncomplicated: Secondary | ICD-10-CM | POA: Diagnosis present

## 2018-08-28 LAB — LIPID PANEL
Cholesterol: 129 mg/dL (ref 0–200)
HDL: 61 mg/dL (ref >40)
LDL Cholesterol: 56 mg/dL (ref 0–99)
Total CHOL/HDL Ratio: 2.1 RATIO
Triglycerides: 62 mg/dL (ref <150)
VLDL: 12 mg/dL (ref 0–40)

## 2018-08-28 LAB — COMPREHENSIVE METABOLIC PANEL
ALBUMIN: 3.6 g/dL (ref 3.5–5.0)
ALT: 15 U/L (ref 0–44)
AST: 23 U/L (ref 15–41)
Alkaline Phosphatase: 49 U/L (ref 38–126)
Anion gap: 12 (ref 5–15)
BUN: 10 mg/dL (ref 6–20)
CHLORIDE: 105 mmol/L (ref 98–111)
CO2: 17 mmol/L — ABNORMAL LOW (ref 22–32)
Calcium: 8.9 mg/dL (ref 8.9–10.3)
Creatinine, Ser: 0.51 mg/dL (ref 0.44–1.00)
GFR calc Af Amer: >60 mL/min (ref >60)
GFR calc non Af Amer: >60 mL/min (ref >60)
Glucose, Bld: 99 mg/dL (ref 70–99)
Potassium: 4 mmol/L (ref 3.5–5.1)
Sodium: 134 mmol/L — ABNORMAL LOW (ref 135–145)
Total Bilirubin: 0.2 mg/dL — ABNORMAL LOW (ref 0.3–1.2)
Total Protein: 7.3 g/dL (ref 6.5–8.1)

## 2018-08-28 LAB — URINALYSIS, COMPLETE (UACMP) WITH MICROSCOPIC
BILIRUBIN URINE: NEGATIVE
Bacteria, UA: NONE SEEN
Glucose, UA: NEGATIVE mg/dL
Hgb urine dipstick: NEGATIVE
Ketones, ur: NEGATIVE mg/dL
Leukocytes, UA: NEGATIVE
Nitrite: NEGATIVE
Protein, ur: NEGATIVE mg/dL
Specific Gravity, Urine: 1.011 (ref 1.005–1.030)
pH: 7 (ref 5.0–8.0)

## 2018-08-28 LAB — TSH: TSH: 3.943 u[IU]/mL (ref 0.350–4.500)

## 2018-08-28 LAB — HEMOGLOBIN A1C
Hgb A1c MFr Bld: 4.9 % (ref 4.8–5.6)
MEAN PLASMA GLUCOSE: 93.93 mg/dL

## 2018-08-28 LAB — PREGNANCY, URINE: Preg Test, Ur: NEGATIVE

## 2018-08-28 MED ORDER — LORAZEPAM 2 MG PO TABS
2.0000 mg | ORAL_TABLET | Freq: Once | ORAL | Status: AC
  Administered 2018-08-28: 2 mg via ORAL
  Filled 2018-08-28: qty 1

## 2018-08-28 MED ORDER — LORAZEPAM 1 MG PO TABS: 1.0000 mg | ORAL_TABLET | Freq: Three times a day (TID) | ORAL | Status: DC

## 2018-08-28 MED ORDER — ALUM & MAG HYDROXIDE-SIMETH 200-200-20 MG/5ML PO SUSP: 30.0000 mL | ORAL | Status: DC | PRN

## 2018-08-28 MED ORDER — NICOTINE 21 MG/24HR TD PT24
21.0000 mg | MEDICATED_PATCH | Freq: Every day | TRANSDERMAL | Status: DC
  Administered 2018-08-28 – 2018-09-02 (×6): 21 mg via TRANSDERMAL
  Filled 2018-08-28 (×6): qty 1

## 2018-08-28 MED ORDER — NAPHAZOLINE-GLYCERIN 0.012-0.2 % OP SOLN
1.0000 [drp] | Freq: Four times a day (QID) | OPHTHALMIC | Status: DC | PRN
  Administered 2018-08-28 – 2018-08-31 (×3): 1 [drp] via OPHTHALMIC
  Administered 2018-09-01 – 2018-09-02 (×2): 2 [drp] via OPHTHALMIC
  Filled 2018-08-28 (×2): qty 15

## 2018-08-28 MED ORDER — CHLORDIAZEPOXIDE HCL 10 MG PO CAPS
50.0000 mg | ORAL_CAPSULE | Freq: Once | ORAL | Status: DC
  Filled 2018-08-28: qty 5

## 2018-08-28 MED ORDER — LORAZEPAM 1 MG PO TABS
1.0000 mg | ORAL_TABLET | Freq: Four times a day (QID) | ORAL | Status: AC | PRN
  Administered 2018-08-28 – 2018-08-31 (×7): 1 mg via ORAL
  Filled 2018-08-28 (×7): qty 1

## 2018-08-28 MED ORDER — FOLIC ACID 1 MG PO TABS
1.0000 mg | ORAL_TABLET | Freq: Every day | ORAL | Status: DC
  Administered 2018-08-28 – 2018-09-02 (×6): 1 mg via ORAL
  Filled 2018-08-28 (×6): qty 1

## 2018-08-28 MED ORDER — HYDROCERIN EX CREA
TOPICAL_CREAM | CUTANEOUS | Status: DC | PRN
  Administered 2018-08-28: 20:00:00 via TOPICAL
  Filled 2018-08-28 (×2): qty 113

## 2018-08-28 MED ORDER — LORAZEPAM 1 MG PO TABS: 1.0000 mg | ORAL_TABLET | Freq: Two times a day (BID) | ORAL | Status: DC

## 2018-08-28 MED ORDER — MAGNESIUM HYDROXIDE 400 MG/5ML PO SUSP: 30.0000 mL | Freq: Every day | ORAL | Status: DC | PRN

## 2018-08-28 MED ORDER — LEVETIRACETAM 500 MG PO TABS
500.0000 mg | ORAL_TABLET | Freq: Two times a day (BID) | ORAL | Status: DC
  Administered 2018-08-28 – 2018-09-02 (×11): 500 mg via ORAL
  Filled 2018-08-28 (×17): qty 1

## 2018-08-28 MED ORDER — QUETIAPINE FUMARATE 200 MG PO TABS
200.0000 mg | ORAL_TABLET | Freq: Every day | ORAL | Status: DC
  Administered 2018-08-28 – 2018-09-01 (×5): 200 mg via ORAL
  Filled 2018-08-28 (×5): qty 1

## 2018-08-28 MED ORDER — HYDROXYZINE HCL 50 MG PO TABS
50.0000 mg | ORAL_TABLET | Freq: Three times a day (TID) | ORAL | Status: DC | PRN
  Administered 2018-08-28 – 2018-09-02 (×5): 50 mg via ORAL
  Filled 2018-08-28 (×7): qty 1

## 2018-08-28 MED ORDER — LOPERAMIDE HCL 2 MG PO CAPS: 2.0000 mg | ORAL_CAPSULE | ORAL | Status: AC | PRN

## 2018-08-28 MED ORDER — LORAZEPAM 1 MG PO TABS: 1.0000 mg | ORAL_TABLET | Freq: Four times a day (QID) | ORAL | Status: DC

## 2018-08-28 MED ORDER — VITAMIN B-1 100 MG PO TABS
100.0000 mg | ORAL_TABLET | Freq: Every day | ORAL | Status: DC
  Administered 2018-08-28 – 2018-09-02 (×6): 100 mg via ORAL
  Filled 2018-08-28 (×6): qty 1

## 2018-08-28 MED ORDER — TRAZODONE HCL 100 MG PO TABS
100.0000 mg | ORAL_TABLET | Freq: Every evening | ORAL | Status: DC | PRN
  Administered 2018-08-28 – 2018-09-01 (×6): 100 mg via ORAL
  Filled 2018-08-28 (×5): qty 1

## 2018-08-28 MED ORDER — LORAZEPAM 2 MG/ML IJ SOLN: 2.0000 mg | Freq: Four times a day (QID) | INTRAMUSCULAR | Status: DC | PRN

## 2018-08-28 MED ORDER — MENTHOL 3 MG MT LOZG
1.0000 | LOZENGE | OROMUCOSAL | Status: DC | PRN
  Administered 2018-08-28 – 2018-09-02 (×2): 3 mg via ORAL
  Filled 2018-08-28 (×6): qty 9

## 2018-08-28 MED ORDER — CHLORDIAZEPOXIDE HCL 5 MG PO CAPS
25.0000 mg | ORAL_CAPSULE | Freq: Four times a day (QID) | ORAL | Status: DC
  Administered 2018-08-28: 25 mg via ORAL
  Filled 2018-08-28: qty 5

## 2018-08-28 MED ORDER — ATORVASTATIN CALCIUM 20 MG PO TABS
10.0000 mg | ORAL_TABLET | Freq: Every day | ORAL | Status: DC
  Administered 2018-08-28 – 2018-09-01 (×5): 10 mg via ORAL
  Filled 2018-08-28 (×5): qty 1

## 2018-08-28 MED ORDER — ACETAMINOPHEN 325 MG PO TABS
650.0000 mg | ORAL_TABLET | Freq: Four times a day (QID) | ORAL | Status: DC | PRN
  Administered 2018-08-28 – 2018-08-31 (×3): 650 mg via ORAL
  Filled 2018-08-28 (×4): qty 2

## 2018-08-28 MED ORDER — GABAPENTIN 300 MG PO CAPS
300.0000 mg | ORAL_CAPSULE | Freq: Three times a day (TID) | ORAL | Status: DC
  Administered 2018-08-28 – 2018-09-02 (×17): 300 mg via ORAL
  Filled 2018-08-28 (×17): qty 1

## 2018-08-28 MED ORDER — QUETIAPINE FUMARATE 100 MG PO TABS
100.0000 mg | ORAL_TABLET | Freq: Two times a day (BID) | ORAL | Status: DC
  Administered 2018-08-28 – 2018-09-02 (×10): 100 mg via ORAL
  Filled 2018-08-28 (×10): qty 1

## 2018-08-28 MED ORDER — LORAZEPAM 1 MG PO TABS: 1.0000 mg | ORAL_TABLET | Freq: Every day | ORAL | Status: DC

## 2018-08-28 MED ORDER — LEVOTHYROXINE SODIUM 75 MCG PO TABS
75.0000 ug | ORAL_TABLET | Freq: Every day | ORAL | Status: DC
  Administered 2018-08-28 – 2018-08-30 (×3): 75 ug via ORAL
  Filled 2018-08-28 (×3): qty 1
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
## 2018-08-28 NOTE — BHH Group Notes (Signed)
Balance In Life 08/28/2018 1PM  Type of Therapy/Topic:  Group Therapy:  Balance in Life  Participation Level:  Did Not Attend  Description of Group:   This group will address the concept of balance and how it feels and looks when one is unbalanced. Patients will be encouraged to process areas in their lives that are out of balance and identify reasons for remaining unbalanced. Facilitators will guide patients in utilizing problem-solving interventions to address and correct the stressor making their life unbalanced. Understanding and applying boundaries will be explored and addressed for obtaining and maintaining a balanced life. Patients will be encouraged to explore ways to assertively make their unbalanced needs known to significant others in their lives, using other group members and facilitator for support and feedback.  Therapeutic Goals: 1. Patient will identify two or more emotions or situations they have that consume much of in their lives. 2. Patient will identify signs/triggers that life has become out of balance:  3. Patient will identify two ways to set boundaries in order to achieve balance in their lives:  4. Patient will demonstrate ability to communicate their needs through discussion and/or role plays  Summary of Patient Progress:    Therapeutic Modalities:   Cognitive Behavioral Therapy Solution-Focused Therapy Assertiveness Training  Melany Wiesman Lynelle Smoke, LCSW

## 2018-08-28 NOTE — Plan of Care (Signed)
Patient  remained in room working on coping skills and decision making . Denies suicidal ideations . Interacting  with peers and staff  .   Voice no concerns around wake and sleep . No safety concerns . Able to verbalize understanding of medication received . Educated to  to attend  unit programming. Verbalizing  understanding of information received  . Patient voice of wanting to go to Rehab facility  for extended  stay.    Problem: Education: Goal: Knowledge of disease or condition will improve Outcome: Progressing Goal: Understanding of discharge needs will improve Outcome: Progressing   Problem: Health Behavior/Discharge Planning: Goal: Ability to identify changes in lifestyle to reduce recurrence of condition will improve Outcome: Progressing Goal: Identification of resources available to assist in meeting health care needs will improve Outcome: Progressing   Problem: Safety: Goal: Ability to remain free from injury will improve Outcome: Progressing   Problem: Education: Goal: Ability to make informed decisions regarding treatment will improve Outcome: Progressing   Problem: Coping: Goal: Coping ability will improve Outcome: Progressing   Problem: Health Behavior/Discharge Planning: Goal: Identification of resources available to assist in meeting health care needs will improve Outcome: Progressing   Problem: Medication: Goal: Compliance with prescribed medication regimen will improve Outcome: Progressing   Problem: Self-Concept: Goal: Ability to disclose and discuss suicidal ideas will improve Outcome: Progressing Goal: Will verbalize positive feelings about self Outcome: Progressing

## 2018-08-28 NOTE — Progress Notes (Signed)
Recreation Therapy Notes  Date: 08/28/2018  Time: 9:30 pm   Location: [facility]   Behavioral response: N/A   Intervention Topic: Self-esteem  Discussion/Intervention: Patient did not attend group.   Clinical Observations/Feedback:  Patient did not attend group.   Amneet Cendejas LRT/CTRS        Raynard Mapps 08/28/2018 10:57 AM

## 2018-08-28 NOTE — Progress Notes (Signed)
Admission Note:  32 yr Female who presents IVC in no acute distress for the treatment of SI and Substance Abuse  Patient appears flat, sad, tearful and depressed, after some reassurance and emotional support she later became calm and cooperative with admission process. Patient presents with passive SI and contracts for safety upon admission, and  denies AVH . Patience explained she experienced worsening depression, sadness, worsening substance abuse and suicidal ideation has a result of the stressors in her life. Patient's skin was assessed in presence of Jessica MHT and found to be clear of any abnormal marks, skin is warm to touch. Patient was also searched and no contraband found, POC and unit policies explained and understanding verbalized. Consents obtained  Patient had no additional questions or concerns, 15 minutes safety checks maintained will continue to monitor.

## 2018-08-28 NOTE — Tx Team (Signed)
Initial Treatment Plan 08/28/2018 6:47 AM Dawn Foley EML:[id number]    PATIENT STRESSORS: Financial difficulties Health problems Medication change or noncompliance Substance abuse   PATIENT STRENGTHS: Agricultural engineer for treatment/growth Religious Affiliation   PATIENT IDENTIFIED PROBLEMS: SUBSTANCE ABUSE     SUICIDAL IDEATION                  DISCHARGE CRITERIA:  Adequate post-discharge living arrangements Improved stabilization in mood, thinking, and/or behavior Motivation to continue treatment in a less acute level of care  PRELIMINARY DISCHARGE PLAN: Attend 12-step recovery group Outpatient therapy Participate in family therapy  PATIENT/FAMILY INVOLVEMENT: This treatment plan has been presented to and reviewed with the patient, Dawn Foley, The patient and family have been given the opportunity to ask questions and make suggestions.  Harl Bowie, RN 08/28/2018, 6:47 AM

## 2018-08-28 NOTE — Progress Notes (Signed)
D: Patient stated slept fair last night .Stated appetite  good and energy level  Is normal. Stated concentration poor. Stated on Depression scale 9, hopeless 9 and anxiety 10 .( low 0-10 high) Denies suicidal  homicidal ideations  .  No auditory hallucinations  No pain concerns . Appropriate ADL'S. Interacting with peers and staff. Patient  remained in room working on coping skills and decision making . Denies suicidal ideations . Interacting  with peers and staff  .   Voice no concerns around wake and sleep . No safety concerns . Able to verbalize understanding of medication received . Educated to  to attend  unit programming. Verbalizing  understanding of information received  . Patient voice of wanting to go to Rehab facility  for extended  stay.  No group participation this shift , stated she will focus on them tomorrow .   A: Encourage patient participation with unit programming . Instruction  Given on  Medication , verbalize understanding. R: Voice no other concerns. Staff continue to monitor

## 2018-08-28 NOTE — Plan of Care (Signed)
  Problem: Education: Goal: Ability to make informed decisions regarding treatment will improve Outcome: Progressing  PATIENT ABLE TO MAKE INFORMED  CONSENT ABOUT TREATMENT

## 2018-08-29 MED ORDER — BIOTENE DRY MOUTH MT LIQD: 15.0000 mL | OROMUCOSAL | Status: DC | PRN

## 2018-08-29 NOTE — Progress Notes (Signed)
Recreation Therapy Notes   Date: 08/29/2018  Time: 9:30 pm   Location: Craft Room   Behavioral response: N/A   Intervention Topic: Leisure  Discussion/Intervention: Patient did not attend group.   Clinical Observations/Feedback:  Patient did not attend group.   Bryen Hinderman LRT/CTRS        Selma Mink 08/29/2018 10:16 AM

## 2018-08-29 NOTE — [facility] Group Notes (Signed)
  LCSW Group Therapy Note  Friday Dec 20th/2019 1 pm  Type of Therapy and Topic:  Group Therapy:  Feelings around Relapse and Recovery  Participation Level:  Patient did not participate   Description of Group:    Patients in this group will discuss emotions they experience before and after a relapse. They will process how experiencing these feelings, or avoidance of experiencing them, relates to having a relapse. Facilitator will guide patients to explore emotions they have related to recovery. Patients will be encouraged to process which emotions are more powerful. They will be guided to discuss the emotional reaction significant others in their lives may have to patients' relapse or recovery. Patients will be assisted in exploring ways to respond to the emotions of others without this contributing to a relapse.  Therapeutic Goals: 1. Patient will identify two or more emotions that lead to a relapse for them 2. Patient will identify two emotions that result when they relapse 3. Patient will identify two emotions related to recovery 4. Patient will demonstrate ability to communicate their needs through discussion and/or role plays   Summary of Patient Progress:     Therapeutic Modalities:   Cognitive Behavioral Therapy Solution-Focused Therapy Assertiveness Training Relapse Prevention Therapy   Artis Delay December 20/2019

## 2018-08-29 NOTE — Progress Notes (Signed)
[facility] MD Progress Note  08/29/2018 4:45 PM Dawn Foley  MRN:  [id number] Subjective:  Chart reviewed; pt assessed individually and discussed with her treatment team.    Pt reports that her mood is a little better today.  Pt states staff reviewed a paper with her that stated she has major depressive disorder, but pt believes that she has bipolar disorder, instead of major depressive disorder.  Pt describes prior episodes of anger and inability to focus as mania.  She states she was diagnosed with bipolar disorder at 32 yo.  We discussed that during this time she was also drinking alcohol, but pt states "I didn't become addicted to alcohol I was in my twenties".  Of note, yesterday the pt stated she first started drinking alcohol at 32 yo.   It's still difficult to illicit any discreet episodes of mania, but pt states seroquel works well for mood symptoms, including depression, so we will continue with it.   Pt enquires about latuda, but she doesn't have insurance and it would cost over $1200/month out of pocket.  Pt states she's unable to afford it.    Pt denies SI, AH, VH, but continues to have HI towards her boyfriend b/c "of all the things he's done to me... for the way he acts when he gets drunk."  Pt states her boyfriend is an alcoholic and going back to living with him is not a good idea.  Pt is now interested in going to the [employer].  She received 1 mg ativan this morning according to her CIWA score.  She's calm and feeling like the prn ativan is helping with her alcohol withdrawal symptoms.  Of note, pt has is a heavy drinker and has a history of DTs.  Her last drink was on 08/23/18 per her report.     Principal Problem: Major depressive disorder, recurrent severe without psychotic features ([place]) Diagnosis: Principal Problem:   Major depressive disorder, recurrent severe without psychotic features ([place]) Active Problems:   Alcohol use disorder, severe, dependence ([place])    Hypothyroidism   Alcohol withdrawal ([place])   Tobacco use disorder   Severe episode of recurrent major depressive disorder, without psychotic features ([place])  Total Time spent with patient, reviewing her chart, coordinating care and discussing plan of care treatment team: 35 min   Past Psychiatric and Substance use History: Prior psychiatric hospitalizations at [facility] and [facility].  Patient has also been admitted to [facility] in the past.  Patient reports a history of bipolar disorder, depression, severe alcohol use disorder.  Medications include Seroquel and trazodone.  Additionally the patient has been prescribed Neurontin and Campral. Patient reports that she first started drinking at 32 years old.  Patient drinks up to half a gallon of 1/5 liquor +12-18 beers daily.  Patient admits that when she drinks, she blacks out.  She reports a history of delirium tremens and alcohol withdrawal symptoms.  Past Medical History:  Past Medical History:  Diagnosis Date  . Alcoholism ([place])   . Anemia   . Anxiety   . Arthritis   . Asthma    as a child  . Bipolar disorder ([place])   . Cancer ([place])    colon  . Chronic kidney disease   . Complication of anesthesia    woke up during colonoscopy  . Depression   . Elevated liver enzymes   . GERD (gastroesophageal reflux disease)   . H/O alcohol abuse    clean for 1  month as of 07/17/17  . Heart murmur    per [facility] per PT  . Hepatitis    denies  . Hepatitis C   . Hypothyroidism   . Mallory-Weiss tear   . Nicotine dependence   . Schizophrenia ([place])   . Seizures ([place])    seizures - most recent 05/2017, supposed to be on Tegretol but can't afford  . Thrombocytopenia ([facility])    Overview:  alcoholism  . Tibial plateau fracture, right   . Upper GI bleed     Past Surgical History:  Procedure Laterality Date  . APPENDECTOMY    . DILATION AND CURETTAGE OF UTERUS    . ESOPHAGOGASTRODUODENOSCOPY (EGD) WITH PROPOFOL N/A 09/04/2016    Procedure: ESOPHAGOGASTRODUODENOSCOPY (EGD) WITH PROPOFOL;  Surgeon: Doran Stabler, MD;  Location: [place];  Service: Endoscopy;  Laterality: N/A;  . HARDWARE REMOVAL Right 06/18/2017   Procedure: REMOVAL RIGHT EXTERNAL FIXATOR;  Surgeon: Altamese [place], MD;  Location: [place];  Service: Orthopedics;  Laterality: Right;  . HERNIA REPAIR     umbilical hernia  . KNEE CLOSED REDUCTION Right 06/18/2017   Procedure: CLOSED MANIPULATION UNDER ANESTHESIA RIGHT KNEE;  Surgeon: Altamese [place], MD;  Location: [place];  Service: Orthopedics;  Laterality: Right;  . ORIF FEMUR FRACTURE Right 07/18/2017   Procedure: REPAIR NONUNION WITH RIA;  Surgeon: Altamese [place], MD;  Location: [place];  Service: Orthopedics;  Laterality: Right;  . ORIF TIBIA PLATEAU     Family History:  Family History  Problem Relation Age of Onset  . Mental illness Other   . Thyroid disease Other   . Alcoholism Brother    Family Psychiatric  History: Reports that her aunt committed suicide via self-inflicted gunshot, patient reports a history of depression, anxiety, schizophrenia, and bipolar disorder in her family.  Denies a history of substance use disorders or alcohol use disorder in her family.  Social History:  Social History   Substance and Sexual Activity  Alcohol Use Yes  . Alcohol/week: 16.0 standard drinks  . Types: 16 Standard drinks or equivalent per week   Comment: 1/2 gallon liquor daily     Social History   Substance and Sexual Activity  Drug Use No    Social History   Socioeconomic History  . Marital status: Single    Spouse name: Not on file  . Number of children: Not on file  . Years of education: Not on file  . Highest education level: Not on file  Occupational History  . Not on file  Social Needs  . Financial resource strain: Not on file  . Food insecurity:    Worry: Not on file    Inability: Not on file  . Transportation needs:    Medical: Not on file    Non-medical: Not on file   Tobacco Use  . Smoking status: Current Every Day Smoker    Packs/day: 1.00    Types: Cigarettes  . Smokeless tobacco: Never Used  Substance and Sexual Activity  . Alcohol use: Yes    Alcohol/week: 16.0 standard drinks    Types: 16 Standard drinks or equivalent per week    Comment: 1/2 gallon liquor daily  . Drug use: No  . Sexual activity: Not Currently    Birth control/protection: None  Lifestyle  . Physical activity:    Days per week: Not on file    Minutes per session: Not on file  . Stress: Not on file  Relationships  . Social connections:  Talks on phone: Not on file    Gets together: Not on file    Attends religious service: Not on file    Active member of club or organization: Not on file    Attends meetings of clubs or organizations: Not on file    Relationship status: Not on file  Other Topics Concern  . Not on file  Social History Narrative  . Not on file   Additional Social History: Patient reports that she is from [place].  She has 47 and 72 year old children.  She reports that she lives with her boyfriend part-time and her mother and children part-time.  Patient reports that she completed 2 semesters of college.  She would like to return to college and get her degree in criminology.  She is currently unemployed.  Sleep: Fair  Appetite:  Good  Current Medications: Current Facility-Administered Medications  Medication Dose Route Frequency Provider Last Rate Last Dose  . acetaminophen (TYLENOL) tablet 650 mg  650 mg Oral Q6H PRN Pucilowska, Jolanta B, MD   650 mg at 08/28/18 2004  . alum & mag hydroxide-simeth (MAALOX/MYLANTA) 200-200-20 MG/5ML suspension 30 mL  30 mL Oral Q4H PRN Pucilowska, Jolanta B, MD      . antiseptic oral rinse (BIOTENE) solution 15 mL  15 mL Mouth Rinse PRN Tennis Ship, MD      . atorvastatin (LIPITOR) tablet 10 mg  10 mg Oral q1800 Pucilowska, Jolanta B, MD   10 mg at 08/28/18 1708  . folic acid (FOLVITE) tablet 1 mg  1 mg  Oral Daily Tennis Ship, MD   1 mg at 08/29/18 0801  . gabapentin (NEURONTIN) capsule 300 mg  300 mg Oral TID Pucilowska, Jolanta B, MD   300 mg at 08/29/18 1222  . hydrocerin (EUCERIN) cream   Topical PRN Tennis Ship, MD      . hydrOXYzine (ATARAX/VISTARIL) tablet 50 mg  50 mg Oral TID PRN Pucilowska, Jolanta B, MD   50 mg at 08/28/18 0422  . levETIRAcetam (KEPPRA) tablet 500 mg  500 mg Oral BID Pucilowska, Jolanta B, MD   500 mg at 08/29/18 0801  . levothyroxine (SYNTHROID, LEVOTHROID) tablet 75 mcg  75 mcg Oral Q0600 Pucilowska, Jolanta B, MD   75 mcg at 08/29/18 0643  . loperamide (IMODIUM) capsule 2-4 mg  2-4 mg Oral PRN Tennis Ship, MD      . LORazepam (ATIVAN) injection 2 mg  2 mg Intramuscular Q6H PRN Tennis Ship, MD      . LORazepam (ATIVAN) tablet 1 mg  1 mg Oral Q6H PRN Tennis Ship, MD   1 mg at 08/29/18 0801  . magnesium hydroxide (MILK OF MAGNESIA) suspension 30 mL  30 mL Oral Daily PRN Pucilowska, Jolanta B, MD      . menthol-cetylpyridinium (CEPACOL) lozenge 3 mg  1 lozenge Oral PRN Tennis Ship, MD   3 mg at 08/28/18 1957  . naphazoline-glycerin (CLEAR EYES REDNESS) ophth solution 1-2 drop  1-2 drop Both Eyes QID PRN Tennis Ship, MD   1 drop at 08/29/18 0825  . nicotine (NICODERM CQ - dosed in mg/24 hours) patch 21 mg  21 mg Transdermal Q0600 Pucilowska, Jolanta B, MD   21 mg at 08/29/18 0643  . QUEtiapine (SEROQUEL) tablet 100 mg  100 mg Oral BID Tennis Ship, MD   100 mg at 08/29/18 0801  . QUEtiapine (SEROQUEL) tablet 200 mg  200 mg Oral QHS Pucilowska, Jolanta B, MD   200 mg at 08/28/18 2145  .  thiamine (VITAMIN B-1) tablet 100 mg  100 mg Oral Daily Tennis Ship, MD   100 mg at 08/29/18 0801  . traZODone (DESYREL) tablet 100 mg  100 mg Oral QHS PRN Pucilowska, Jolanta B, MD   100 mg at 08/28/18 2145    Lab Results:  Results for orders placed or performed during the hospital encounter of 08/28/18 (from the past 48 hour(s))  Lipid panel     Status: None    Collection Time: 08/28/18  7:08 AM  Result Value Ref Range   Cholesterol 129 0 - 200 mg/dL   Triglycerides 62 <150 mg/dL   HDL 61 >40 mg/dL   Total CHOL/HDL Ratio 2.1 RATIO   VLDL 12 0 - 40 mg/dL   LDL Cholesterol 56 0 - 99 mg/dL    Comment:        Total Cholesterol/HDL:CHD Risk Coronary Heart Disease Risk Table                     Men   Women  1/2 Average Risk   3.4   3.3  Average Risk       5.0   4.4  2 X Average Risk   9.6   7.1  3 X Average Risk  23.4   11.0        Use the calculated Patient Ratio above and the CHD Risk Table to determine the patient's CHD Risk.        ATP III CLASSIFICATION (LDL):  <100     mg/dL   Optimal  100-129  mg/dL   Near or Above                    Optimal  130-159  mg/dL   Borderline  160-189  mg/dL   High  >190     mg/dL   Very High Performed at [facility], [address] [place], [place] [address]   TSH     Status: None   Collection Time: 08/28/18  7:08 AM  Result Value Ref Range   TSH 3.943 0.350 - 4.500 uIU/mL    Comment: Performed by a 3rd Generation assay with a functional sensitivity of <=0.01 uIU/mL. Performed at [facility], [address] [place], [place] [address]   Comprehensive metabolic panel     Status: Abnormal   Collection Time: 08/28/18  7:08 AM  Result Value Ref Range   Sodium 134 (L) 135 - 145 mmol/L   Potassium 4.0 3.5 - 5.1 mmol/L   Chloride 105 98 - 111 mmol/L   CO2 17 (L) 22 - 32 mmol/L   Glucose, Bld 99 70 - 99 mg/dL   BUN 10 6 - 20 mg/dL   Creatinine, Ser 0.51 0.44 - 1.00 mg/dL   Calcium 8.9 8.9 - 10.3 mg/dL   Total Protein 7.3 6.5 - 8.1 g/dL   Albumin 3.6 3.5 - 5.0 g/dL   AST 23 15 - 41 U/L   ALT 15 0 - 44 U/L   Alkaline Phosphatase 49 38 - 126 U/L   Total Bilirubin 0.2 (L) 0.3 - 1.2 mg/dL   GFR calc non Af Amer >60 >60 mL/min   GFR calc Af Amer >60 >60 mL/min   Anion gap 12 5 - 15    Comment: Performed at [facility], [address] [place], [place] [address]   Hemoglobin A1c     Status: None   Collection Time: 08/28/18  8:54 AM  Result Value  Ref Range   Hgb A1c MFr Bld 4.9 4.8 - 5.6 %    Comment: (NOTE) Pre diabetes:          5.7%-6.4% Diabetes:              >6.4% Glycemic control for   <7.0% adults with diabetes    Mean Plasma Glucose 93.93 mg/dL    Comment: Performed at [facility] [address], [place], [place] [address]  Pregnancy, urine     Status: None   Collection Time: 08/28/18 11:07 AM  Result Value Ref Range   Preg Test, Ur NEGATIVE NEGATIVE    Comment: Performed at [facility], [address] [place], [place] [address]  Urinalysis, Complete w Microscopic     Status: Abnormal   Collection Time: 08/28/18  3:48 PM  Result Value Ref Range   Color, Urine YELLOW (A) YELLOW   APPearance CLEAR (A) CLEAR   Specific Gravity, Urine 1.011 1.005 - 1.030   pH 7.0 5.0 - 8.0   Glucose, UA NEGATIVE NEGATIVE mg/dL   Hgb urine dipstick NEGATIVE NEGATIVE   Bilirubin Urine NEGATIVE NEGATIVE   Ketones, ur NEGATIVE NEGATIVE mg/dL   Protein, ur NEGATIVE NEGATIVE mg/dL   Nitrite NEGATIVE NEGATIVE   Leukocytes, UA NEGATIVE NEGATIVE   RBC / HPF 0-5 0 - 5 RBC/hpf   WBC, UA 0-5 0 - 5 WBC/hpf   Bacteria, UA NONE SEEN NONE SEEN   Squamous Epithelial / LPF 0-5 0 - 5   Mucus PRESENT     Comment: Performed at [facility], [address] [place], [place] [address]    Blood Alcohol level:  Lab Results  Component Value Date   [facility] <10 07/18/2017   ETH <10 40/34/7425    Metabolic Disorder Labs: Lab Results  Component Value Date   HGBA1C 4.9 08/28/2018   MPG 93.93 08/28/2018   MPG 88.19 11/06/2017   No results found for: PROLACTIN Lab Results  Component Value Date   CHOL 129 08/28/2018   TRIG 62 08/28/2018   HDL 61 08/28/2018   CHOLHDL 2.1 08/28/2018   VLDL 12 08/28/2018   LDLCALC 56 08/28/2018   LDLCALC 249 (H) 11/06/2017    Physical Findings: AIMS: Facial and Oral Movements Muscles of Facial  Expression: None, normal Lips and Perioral Area: None, normal Jaw: None, normal Tongue: None, normal,Extremity Movements Upper (arms, wrists, hands, fingers): None, normal Lower (legs, knees, ankles, toes): None, normal, Trunk Movements Neck, shoulders, hips: None, normal, Overall Severity Severity of abnormal movements (highest score from questions above): None, normal Incapacitation due to abnormal movements: None, normal Patient's awareness of abnormal movements (rate only patient's report): No Awareness, Dental Status Current problems with teeth and/or dentures?: Yes(multiple cavities ) Does patient usually wear dentures?: No  CIWA:  CIWA-Ar Total: 6 COWS:  COWS Total Score: 4   Musculoskeletal: Strength & Muscle Tone:At baseline Gait & Station:She walks with a mild limp secondary to a history of right lower leg trauma from a prior accident. Patient leans:Walks with a limp.  Psychiatric Specialty Exam: Physical Exam  Nursing note and vitals reviewed. Constitutional: She is oriented to person, place, and time. She appears well-developed and well-nourished.  Respiratory: Effort normal.  Neurological: She is alert and oriented to person, place, and time.  Skin: Skin is warm and dry.    ROS  Blood pressure 130/62, pulse 99, temperature 97.8 F (36.6 C), temperature source Oral, resp. rate 18, height 5\' 6"  (1.676 m), weight 81.6  kg, SpO2 98 %.Body mass index is 29.05 kg/m.  General Appearance: Fairly Groomed  Eye Contact:  Good  Speech:  Clear and Coherent  Volume:  Normal  Mood:  a little anxious, but overall improved  Affect:  Congruent  Thought Process:  Coherent  Orientation:  Full (Time, Place, and Person)  Thought Content:  Logical  Suicidal Thoughts:  patient denies  Homicidal Thoughts:  pt endorses HI towards her boyfriend  Memory:  intact  Judgement:  Fair  Insight:  Fair  Psychomotor Activity:  Normal  Concentration:  Concentration: Fair and Attention  Span: Fair  Recall:  [employer] of Knowledge:  Fair  Language:  Good  Akathisia:  No  AIMS (if indicated):   0  Assets:  Communication Skills Desire for Improvement Resilience  ADL's:  Intact  Cognition:  WNL  Sleep:  Number of Hours: 5.45     Treatment Plan Summary: Daily contact with patient to assess and evaluate symptoms and progress in treatment, Medication management and Plan See below   Patient is a 32 year old female with a history of seizures, depression and severe alcohol use disorder.  Patient reportedly relapsed on alcohol about 1-1/2 weeks ago.  Patient states her last drink was on 08/23/2018.  Patient has had success on Campral 666 mg 3 times daily in the past, but had difficulty affording it.  Patient reports feeling depressed over her relapse and also she is having discord with her boyfriend.  Patient reportedly held a gun to her head and also had thoughts of killing her boyfriend.  Patient also laments over not being able to give her children the Christmas they deserve.    Pt denies active SI at time of assessment and contracts for safety.  She endorses HI towards her boyfriend.   Major depressive disorder, severe, recurrent without psychotic features (rule out psychotic features--pt states she has intermittent auditory hallucinations, but this is questionable;  it sounds like this may be more of her conscience)  -seroquel 100 mg BID and 200 mg qhs  Seizure disorder -keppra 500 mg BID -also on neurontin 300 mg TID  (but pt states the neurontin is for neuropathic pain from a prior accident) -seizure precautions  Alcohol use disorder, severe (last drink ~08/23/18) -was on librium taper, but pt states she does better with ativan so pt placed on CIWA with Ativan -pt reports a hx of DT's in the past, continue to monitor; BP has been stable.  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 Estimated LOS: 5 to 7 days; consider residential substance abuse treatment vs Piedad Climes, MD 08/29/2018, 4:45 PM

## 2018-08-29 NOTE — Plan of Care (Signed)
  Problem: Education: Goal: Knowledge of disease or condition will improve Outcome: Progressing  Patient has more insight into alcoholism

## 2018-08-29 NOTE — Progress Notes (Signed)
patient alert and oriented x 4, denies pain, affect is blunted, mood is pleasant, patient appearance has improved, she makes appropriate eye contact , thoughts are organized and coherent, speech is non pressured,, denies SI/HI/AVH, no distress noted. Patient is compliant with medication regimen and interacting appropriately with peers and staff. patient  rated  Depression a 6 on a scale ( low 0- 10 high) , support and encouragement offered to patient , 15 minutes safety checks maintained will continue to monitor closely.

## 2018-08-29 NOTE — Tx Team (Addendum)
Interdisciplinary Treatment and Diagnostic Plan Update  08/29/2018 Time of Session: 1030am Dawn Foley STAFF MRN: [id number]  Principal Diagnosis: Major depressive disorder, recurrent severe without psychotic features (Dawn Foley)  Secondary Diagnoses: Principal Problem:   Major depressive disorder, recurrent severe without psychotic features (Dawn Foley) Active Problems:   Alcohol use disorder, severe, dependence (Dawn Foley)   Hypothyroidism   Alcohol withdrawal (Dawn Foley)   Tobacco use disorder   Severe episode of recurrent major depressive disorder, without psychotic features (Dawn Foley)   Current Medications:  Current Facility-Administered Medications  Medication Dose Route Frequency Provider Last Rate Last Dose  . acetaminophen (TYLENOL) tablet 650 mg  650 mg Oral Q6H PRN Dawn Foley, Dawn B, MD   650 mg at 08/28/18 2004  . alum & mag hydroxide-simeth (MAALOX/MYLANTA) 200-200-20 MG/5ML suspension 30 mL  30 mL Oral Q4H PRN Dawn Foley, Dawn B, MD      . atorvastatin (LIPITOR) tablet 10 mg  10 mg Oral q1800 Dawn Foley, Dawn B, MD   10 mg at 08/28/18 1708  . folic acid (FOLVITE) tablet 1 mg  1 mg Oral Daily Dawn Ship, MD   1 mg at 08/29/18 0801  . gabapentin (NEURONTIN) capsule 300 mg  300 mg Oral TID Dawn Foley, Dawn B, MD   300 mg at 08/29/18 1222  . hydrocerin (EUCERIN) cream   Topical PRN Dawn Ship, MD      . hydrOXYzine (ATARAX/VISTARIL) tablet 50 mg  50 mg Oral TID PRN Dawn Foley, Dawn B, MD   50 mg at 08/28/18 0422  . levETIRAcetam (KEPPRA) tablet 500 mg  500 mg Oral BID Dawn Foley, Dawn B, MD   500 mg at 08/29/18 0801  . levothyroxine (SYNTHROID, LEVOTHROID) tablet 75 mcg  75 mcg Oral Q0600 Dawn Foley, Dawn B, MD   75 mcg at 08/29/18 0643  . loperamide (IMODIUM) capsule 2-4 mg  2-4 mg Oral PRN Dawn Ship, MD      . LORazepam (ATIVAN) injection 2 mg  2 mg Intramuscular Q6H PRN Dawn Ship, MD      . LORazepam (ATIVAN) tablet 1 mg  1 mg Oral Q6H PRN Dawn Ship, MD   1  mg at 08/29/18 0801  . magnesium hydroxide (MILK OF MAGNESIA) suspension 30 mL  30 mL Oral Daily PRN Dawn Foley, Dawn B, MD      . menthol-cetylpyridinium (CEPACOL) lozenge 3 mg  1 lozenge Oral PRN Dawn Ship, MD   3 mg at 08/28/18 1957  . naphazoline-glycerin (CLEAR EYES REDNESS) ophth solution 1-2 drop  1-2 drop Both Eyes QID PRN Dawn Ship, MD   1 drop at 08/29/18 0825  . nicotine (NICODERM CQ - dosed in mg/24 hours) patch 21 mg  21 mg Transdermal Q0600 Dawn Foley, Dawn B, MD   21 mg at 08/29/18 0643  . QUEtiapine (SEROQUEL) tablet 100 mg  100 mg Oral BID Dawn Ship, MD   100 mg at 08/29/18 0801  . QUEtiapine (SEROQUEL) tablet 200 mg  200 mg Oral QHS Dawn Foley, Dawn B, MD   200 mg at 08/28/18 2145  . thiamine (VITAMIN Foley-1) tablet 100 mg  100 mg Oral Daily Dawn Ship, MD   100 mg at 08/29/18 0801  . traZODone (DESYREL) tablet 100 mg  100 mg Oral QHS PRN Dawn Foley, Dawn B, MD   100 mg at 08/28/18 2145   PTA Medications: Medications Prior to Admission  Medication Sig Dispense Refill Last Dose  . acamprosate (CAMPRAL) 333 MG tablet Take 2 tablets (666 mg total) by mouth 3 (three) times daily with meals. 180 tablet  0   . atorvastatin (LIPITOR) 10 MG tablet Take 1 tablet (10 mg total) by mouth daily at 6 PM. 30 tablet 0   . dolutegravir (TIVICAY) 50 MG tablet Take 1 tablet (50 mg total) by mouth daily. 18 tablet 0   . emtricitabine-tenofovir AF (DESCOVY) 200-25 MG tablet Take 1 tablet by mouth daily. 18 tablet 0   . gabapentin (NEURONTIN) 400 MG capsule Take 1 capsule (400 mg total) by mouth 3 (three) times daily. 90 capsule 0   . hydrOXYzine (ATARAX/VISTARIL) 25 MG tablet Take 1 tablet (25 mg total) by mouth every 6 (six) hours as needed for anxiety. 30 tablet 0   . levETIRAcetam (KEPPRA) 500 MG tablet Take 1 tablet (500 mg total) by mouth 2 (two) times daily. 60 tablet 1   . levothyroxine (SYNTHROID, LEVOTHROID) 75 MCG tablet Take 1 tablet (75 mcg total) by mouth  daily before breakfast. 30 tablet 1   . LORazepam (ATIVAN) 0.5 MG tablet Take 1 tablet (0.5 mg total) by mouth 2 (two) times daily. 4 tablet 0   . naphazoline-glycerin (CLEAR EYES) 0.012-0.2 % SOLN Place 1-2 drops into both eyes 4 (four) times daily as needed for irritation.  0 07/17/2017 at Unknown time  . pantoprazole (PROTONIX) 40 MG tablet Take 1 tablet (40 mg total) by mouth daily. 30 tablet 1   . QUEtiapine (SEROQUEL) 100 MG tablet Take 1 tablet (100 mg total) by mouth 2 (two) times daily. For mood control 60 tablet 0   . QUEtiapine (SEROQUEL) 300 MG tablet Take 1 tablet (300 mg total) by mouth at bedtime. For mood control 30 tablet 0     Patient Stressors: Financial difficulties Health problems Medication change or noncompliance Substance abuse  Patient Strengths: Agricultural engineer for treatment/growth Religious Affiliation  Treatment Modalities: Medication Management, Group therapy, Case management,  1 to 1 session with clinician, Psychoeducation, Recreational therapy.   Physician Treatment Plan for Primary Diagnosis: Major depressive disorder, recurrent severe without psychotic features (Dawn Foley) Long Term Goal(s): Improvement in symptoms so as ready for discharge Improvement in symptoms so as ready for discharge   Short Term Goals: Ability to identify changes in lifestyle to reduce recurrence of condition will improve Ability to verbalize feelings will improve Ability to disclose and discuss suicidal ideas Ability to demonstrate self-control will improve Ability to identify and develop effective coping behaviors will improve Compliance with prescribed medications will improve Ability to identify triggers associated with substance abuse/mental health issues will improve Ability to identify changes in lifestyle to reduce recurrence of condition will improve Ability to verbalize feelings will improve Ability to disclose and discuss suicidal ideas Ability to  demonstrate self-control will improve Ability to identify and develop effective coping behaviors will improve Compliance with prescribed medications will improve Ability to identify triggers associated with substance abuse/mental health issues will improve  Medication Management: Evaluate patient's response, side effects, and tolerance of medication regimen.  Therapeutic Interventions: 1 to 1 sessions, Unit Group sessions and Medication administration.  Evaluation of Outcomes: Not Met at this time  Physician Treatment Plan for Secondary Diagnosis: Principal Problem:   Major depressive disorder, recurrent severe without psychotic features ([place]) Active Problems:   Alcohol use disorder, severe, dependence ([place])   Hypothyroidism   Alcohol withdrawal ([place])   Tobacco use disorder   Severe episode of recurrent major depressive disorder, without psychotic features ([place])  Long Term Goal(s): Improvement in symptoms so as ready for discharge Improvement in symptoms so as ready for discharge  Short Term Goals: Ability to identify changes in lifestyle to reduce recurrence of condition will improve Ability to verbalize feelings will improve Ability to disclose and discuss suicidal ideas Ability to demonstrate self-control will improve Ability to identify and develop effective coping behaviors will improve Compliance with prescribed medications will improve Ability to identify triggers associated with substance abuse/mental health issues will improve Ability to identify changes in lifestyle to reduce recurrence of condition will improve Ability to verbalize feelings will improve Ability to disclose and discuss suicidal ideas Ability to demonstrate self-control will improve Ability to identify and develop effective coping behaviors will improve Compliance with prescribed medications will improve Ability to identify triggers associated with substance abuse/mental health issues will improve      Medication Management: Evaluate patient's response, side effects, and tolerance of medication regimen.  Therapeutic Interventions: 1 to 1 sessions, Unit Group sessions and Medication administration.  Evaluation of Outcomes: Not Met at this time   RN Treatment Plan for Primary Diagnosis: Major depressive disorder, recurrent severe without psychotic features ([place]) Long Term Goal(s): Knowledge of disease and therapeutic regimen to maintain health will improve  Short Term Goals: Ability to verbalize feelings will improve, Ability to disclose and discuss suicidal ideas, Ability to identify and develop effective coping behaviors will improve and Compliance with prescribed medications will improve  Medication Management: RN will administer medications as ordered by provider, will assess and evaluate patient's response and provide education to patient for prescribed medication. RN will report any adverse and/or side effects to prescribing provider.  Therapeutic Interventions: 1 on 1 counseling sessions, Psychoeducation, Medication administration, Evaluate responses to treatment, Monitor vital signs and CBGs as ordered, Perform/monitor CIWA, COWS, AIMS and Fall Risk screenings as ordered, Perform wound care treatments as ordered.  Evaluation of Outcomes: Not Met at this time   LCSW Treatment Plan for Primary Diagnosis: Major depressive disorder, recurrent severe without psychotic features ([place]) Long Term Goal(s): Safe transition to appropriate next level of care at discharge, Engage patient in therapeutic group addressing interpersonal concerns.  Short Term Goals: Engage patient in aftercare planning with referrals and resources  Therapeutic Interventions: Assess for all discharge needs, 1 to 1 time with Social worker, Explore available resources and support systems, Assess for adequacy in community support network, Educate family and significant other(s) on suicide prevention, Complete  Psychosocial Assessment, Interpersonal group therapy.  Evaluation of Outcomes: Not Met at this time   Progress in Treatment: Attending groups: No. Participating in groups: No. Taking medication as prescribed: Yes. Toleration medication: Yes. Family/Significant other contact made: No, will contact:  Sandi Mariscal, mother Patient understands diagnosis: Yes. Discussing patient identified problems/goals with staff: Yes. Medical problems stabilized or resolved: Yes. Denies suicidal/homicidal ideation: Yes. Issues/concerns per patient self-inventory: No. Other: NA  New problem(s) identified: No, Describe:  None reported  New Short Term/Long Term Goal(s): "Get better and maintain sobriety"  Patient Goals:  "Get better and maintain sobriety"  Discharge Plan or Barriers: Pt will return home and follow up with outpatient treatment  Reason for Continuation of Hospitalization: Medication stabilization  Estimated Length of Stay: 3-5 days  Recreation Therapy: Stressors- Family, Relationship  Goals- Patient will identify 3 positive coping skills to decrease depressive symptoms within 5 recreation therapy group sessions  Attendees: Patient:Dawn Foley 08/29/2018 4:05 PM  Physician: Layla Barter MD 08/29/2018 4:05 PM  Nursing:  08/29/2018 4:05 PM  RN Care Manager: 08/29/2018 4:05 PM  Social Worker: Sanjuana Kava LCSW 08/29/2018 4:05 PM  Recreational Therapist: Roanna Epley LRT 08/29/2018  4:05 PM  Other:  08/29/2018 4:05 PM  Other:  08/29/2018 4:05 PM  Other: 08/29/2018 4:05 PM    Scribe for Treatment Team: Yvette Rack, LCSW 08/29/2018 4:05 PM

## 2018-08-29 NOTE — Plan of Care (Signed)
Patient is calm oriented and stable contact for safety denies any SI,HI,AVH, compliant with medicines, seen patient in the milieu socializing with peers with out any issues , affect is bright and mood is good expresses no concerns, appetite is good and patient is well hydrated with fluids and juices , thought process is good. Patient is provided with education and reinforcement to maintain her coping skills, patient is able to identify positive attributes about  self , no distress noted 15 minute safety rounding in progress.   Problem: Education: Goal: Knowledge of disease or condition will improve Outcome: Progressing Goal: Understanding of discharge needs will improve Outcome: Progressing   Problem: Health Behavior/Discharge Planning: Goal: Ability to identify changes in lifestyle to reduce recurrence of condition will improve Outcome: Progressing Goal: Identification of resources available to assist in meeting health care needs will improve Outcome: Progressing   Problem: Physical Regulation: Goal: Complications related to the disease process, condition or treatment will be avoided or minimized Outcome: Progressing   Problem: Safety: Goal: Ability to remain free from injury will improve Outcome: Progressing   Problem: Education: Goal: Ability to make informed decisions regarding treatment will improve Outcome: Progressing   Problem: Coping: Goal: Coping ability will improve Outcome: Progressing   Problem: Health Behavior/Discharge Planning: Goal: Identification of resources available to assist in meeting health care needs will improve Outcome: Progressing   Problem: Medication: Goal: Compliance with prescribed medication regimen will improve Outcome: Progressing   Problem: Self-Concept: Goal: Ability to disclose and discuss suicidal ideas will improve Outcome: Progressing Goal: Will verbalize positive feelings about self Outcome: Progressing

## 2018-08-29 NOTE — Plan of Care (Signed)
Patient is alert and oriented. Patient denies SI, HI but positive for hearing voices. Patient states "they tell me to do bad things, but when I am around other people it usually drowns out the voices." patient only complains of having a dry throat. Patient has throat lozenges to help with irritation. Patient states, " Last night I was waking up every 2 hours but I could not rest. I took something to help me sleep but it didn't work. Patient is animated today, talking with peers and staff appropriately and more active in the dayroom today. Safety checks Q 15 minutes. Problem: Education: Goal: Knowledge of disease or condition will improve Outcome: Progressing Goal: Understanding of discharge needs will improve Outcome: Progressing   Problem: Health Behavior/Discharge Planning: Goal: Ability to identify changes in lifestyle to reduce recurrence of condition will improve Outcome: Progressing Goal: Identification of resources available to assist in meeting health care needs will improve Outcome: Progressing   Problem: Physical Regulation: Goal: Complications related to the disease process, condition or treatment will be avoided or minimized Outcome: Progressing   Problem: Safety: Goal: Ability to remain free from injury will improve Outcome: Progressing

## 2018-08-29 NOTE — Progress Notes (Signed)
Recreation Therapy Notes  INPATIENT RECREATION THERAPY ASSESSMENT  Patient Details Name: Dawn Foley MRN: [id number] DOB: August 17, 1986 Today's Date: 08/29/2018       Information Obtained From: Patient  Able to Participate in Assessment/Interview: Yes  Patient Presentation: Responsive  Reason for Admission (Per Patient): Active Symptoms, Other (Comments)(Depressed)  Patient Stressors: Family, Relationship  Coping Skills:   Isolation, Substance Abuse  Leisure Interests (2+):  (Nothing)  Frequency of Recreation/Participation:    Awareness of Community Resources:     [employer]:     Current Use:    If no, Barriers?:    Expressed Interest in [place]:    [place] of Residence:  [employer]  Patient Main Form of Transportation: Walk  Patient Strengths:  I do not know  Patient Identified Areas of Improvement:  Go back to school and get a job  Patient Goal for Hospitalization:  To get better and maintain sobriety   Current SI (including self-harm):  No  Current HI:  Yes(No plans at this time)  Current AVH: Yes(I hear voices that are there you all just can not hear them)  Staff Intervention Plan: Collaborate with Interdisciplinary Treatment Team, Group Attendance  Consent to Intern Participation: N/A  Dawn Foley 08/29/2018, 3:11 PM

## 2018-08-30 DIAGNOSIS — F332 Major depressive disorder, recurrent severe without psychotic features: Principal | ICD-10-CM

## 2018-08-30 DIAGNOSIS — F1020 Alcohol dependence, uncomplicated: Secondary | ICD-10-CM

## 2018-08-30 MED ORDER — CITALOPRAM HYDROBROMIDE 20 MG PO TABS
20.0000 mg | ORAL_TABLET | Freq: Every day | ORAL | Status: DC
  Administered 2018-08-30 – 2018-09-02 (×4): 20 mg via ORAL
  Filled 2018-08-30 (×7): qty 1

## 2018-08-30 MED ORDER — LEVOTHYROXINE SODIUM 100 MCG PO TABS
100.0000 ug | ORAL_TABLET | Freq: Every day | ORAL | Status: DC
  Administered 2018-08-31 – 2018-09-02 (×3): 100 ug via ORAL
  Filled 2018-08-30 (×3): qty 1

## 2018-08-30 NOTE — Progress Notes (Signed)
[facility] MD Progress Note  08/30/2018 3:35 PM Dawn Foley  MRN:  [id number] Subjective:   The patient reports that alcohol withdrawal symptoms have decreased this afternoon compared to this morning.  She did receive Ativan twice total today.  She says the tremors are no longer present this afternoon and no diarrhea today.  She is complaining of dry mouth from medications.  She uses Biotene at home but cannot get any Biotene here in the hospital.  Mood overall remains depressed especially over the break-up with her boyfriend.  She talked her boyfriend today and ended the relationship.  She will have to live with her mother or with her friend after discharge.  She denies any current active or passive suicidal thoughts.  No further homicidal thoughts towards her boyfriend.  She does still report some auditory hallucinations telling her "do not go in this room" or to go into the room.  She does not know if the voices are her thoughts.  Vital signs are stable.  No new somatic complaints other than the dry mouth.  She is tolerating psychotropic medications fairly well.  She slept over 6 hours last night.  Principal Problem: Major depressive disorder, recurrent severe without psychotic features ([place]) Diagnosis: Principal Problem:   Major depressive disorder, recurrent severe without psychotic features ([place]) Active Problems:   Alcohol use disorder, severe, dependence ([place])   Hypothyroidism   Alcohol withdrawal ([place])   Tobacco use disorder   Severe episode of recurrent major depressive disorder, without psychotic features ([place])  Total Time spent with patient, reviewing her chart, coordinating care and discussing plan of care treatment team: 20 minutes   Past Psychiatric and Substance use History: Prior psychiatric hospitalizations at [facility] and [facility].  Patient has also been admitted to [facility] in the past.  Patient reports a history of bipolar disorder, depression, severe alcohol use  disorder.  Medications include Seroquel and trazodone.  Additionally the patient has been prescribed Neurontin and Campral. Patient reports that she first started drinking at 32 years old.  Patient drinks up to half a gallon of 1/5 liquor +12-18 beers daily.  Patient admits that when she drinks, she blacks out.  She reports a history of delirium tremens and alcohol withdrawal symptoms.  Past Medical History:  Past Medical History:  Diagnosis Date  . Alcoholism ([place])   . Anemia   . Anxiety   . Arthritis   . Asthma    as a child  . Bipolar disorder ([place])   . Cancer ([place])    colon  . Chronic kidney disease   . Complication of anesthesia    woke up during colonoscopy  . Depression   . Elevated liver enzymes   . GERD (gastroesophageal reflux disease)   . H/O alcohol abuse    clean for 1 month as of 07/17/17  . Heart murmur    per [facility] per PT  . Hepatitis    denies  . Hepatitis C   . Hypothyroidism   . Mallory-Weiss tear   . Nicotine dependence   . Schizophrenia ([place])   . Seizures ([place])    seizures - most recent 05/2017, supposed to be on Tegretol but can't afford  . Thrombocytopenia ([facility])    Overview:  alcoholism  . Tibial plateau fracture, right   . Upper GI bleed     Past Surgical History:  Procedure Laterality Date  . APPENDECTOMY    . DILATION AND CURETTAGE OF UTERUS    . ESOPHAGOGASTRODUODENOSCOPY (  EGD) WITH PROPOFOL N/A 09/04/2016   Procedure: ESOPHAGOGASTRODUODENOSCOPY (EGD) WITH PROPOFOL;  Surgeon: Doran Stabler, MD;  Location: [facility];  Service: Endoscopy;  Laterality: N/A;  . HARDWARE REMOVAL Right 06/18/2017   Procedure: REMOVAL RIGHT EXTERNAL FIXATOR;  Surgeon: Altamese Avalon, MD;  Location: [facility];  Service: Orthopedics;  Laterality: Right;  . HERNIA REPAIR     umbilical hernia  . KNEE CLOSED REDUCTION Right 06/18/2017   Procedure: CLOSED MANIPULATION UNDER ANESTHESIA RIGHT KNEE;  Surgeon: Altamese Mapleton, MD;  Location: [facility];  Service:  Orthopedics;  Laterality: Right;  . ORIF FEMUR FRACTURE Right 07/18/2017   Procedure: REPAIR NONUNION WITH RIA;  Surgeon: Altamese , MD;  Location: [facility];  Service: Orthopedics;  Laterality: Right;  . ORIF TIBIA PLATEAU     Family History:  Family History  Problem Relation Age of Onset  . Mental illness Other   . Thyroid disease Other   . Alcoholism Brother    Family Psychiatric  History: Reports that her aunt committed suicide via self-inflicted gunshot, patient reports a history of depression, anxiety, schizophrenia, and bipolar disorder in her family.  Denies a history of substance use disorders or alcohol use disorder in her family.  Social History:  Social History   Substance and Sexual Activity  Alcohol Use Yes  . Alcohol/week: 16.0 standard drinks  . Types: 16 Standard drinks or equivalent per week   Comment: 1/2 gallon liquor daily     Social History   Substance and Sexual Activity  Drug Use No    Social History   Socioeconomic History  . Marital status: Single    Spouse name: Not on file  . Number of children: Not on file  . Years of education: Not on file  . Highest education level: Not on file  Occupational History  . Not on file  Social Needs  . Financial resource strain: Not on file  . Food insecurity:    Worry: Not on file    Inability: Not on file  . Transportation needs:    Medical: Not on file    Non-medical: Not on file  Tobacco Use  . Smoking status: Current Every Day Smoker    Packs/day: 1.00    Types: Cigarettes  . Smokeless tobacco: Never Used  Substance and Sexual Activity  . Alcohol use: Yes    Alcohol/week: 16.0 standard drinks    Types: 16 Standard drinks or equivalent per week    Comment: 1/2 gallon liquor daily  . Drug use: No  . Sexual activity: Not Currently    Birth control/protection: None  Lifestyle  . Physical activity:    Days per week: Not on file    Minutes per session: Not on file  . Stress: Not on file   Relationships  . Social connections:    Talks on phone: Not on file    Gets together: Not on file    Attends religious service: Not on file    Active member of club or organization: Not on file    Attends meetings of clubs or organizations: Not on file    Relationship status: Not on file  Other Topics Concern  . Not on file  Social History Narrative  . Not on file   Additional Social History: Patient reports that she is from [facility].  She has 40 and 62 year old children.  She reports that she lives with her boyfriend part-time and her mother and children part-time.  Patient reports that she completed  2 semesters of college.  She would like to return to college and get her degree in criminology.  She is currently unemployed.  Sleep: Fair  Appetite:  Good  Current Medications: Current Facility-Administered Medications  Medication Dose Route Frequency Provider Last Rate Last Dose  . acetaminophen (TYLENOL) tablet 650 mg  650 mg Oral Q6H PRN Pucilowska, Jolanta B, MD   650 mg at 08/30/18 1236  . alum & mag hydroxide-simeth (MAALOX/MYLANTA) 200-200-20 MG/5ML suspension 30 mL  30 mL Oral Q4H PRN Pucilowska, Jolanta B, MD      . antiseptic oral rinse (BIOTENE) solution 15 mL  15 mL Mouth Rinse PRN Tennis Ship, MD      . atorvastatin (LIPITOR) tablet 10 mg  10 mg Oral q1800 Pucilowska, Jolanta B, MD   10 mg at 08/29/18 1734  . citalopram (CELEXA) tablet 20 mg  20 mg Oral Daily Chauncey Mann, MD      . folic acid (FOLVITE) tablet 1 mg  1 mg Oral Daily Tennis Ship, MD   1 mg at 08/30/18 0826  . gabapentin (NEURONTIN) capsule 300 mg  300 mg Oral TID Pucilowska, Jolanta B, MD   300 mg at 08/30/18 1236  . hydrocerin (EUCERIN) cream   Topical PRN Tennis Ship, MD      . hydrOXYzine (ATARAX/VISTARIL) tablet 50 mg  50 mg Oral TID PRN Pucilowska, Jolanta B, MD   50 mg at 08/28/18 0422  . levETIRAcetam (KEPPRA) tablet 500 mg  500 mg Oral BID Pucilowska, Jolanta B, MD   500 mg at  08/30/18 0826  . [START ON 08/31/2018] levothyroxine (SYNTHROID, LEVOTHROID) tablet 100 mcg  100 mcg Oral Q0600 Chauncey Mann, MD      . loperamide (IMODIUM) capsule 2-4 mg  2-4 mg Oral PRN Tennis Ship, MD      . LORazepam (ATIVAN) injection 2 mg  2 mg Intramuscular Q6H PRN Tennis Ship, MD      . LORazepam (ATIVAN) tablet 1 mg  1 mg Oral Q6H PRN Tennis Ship, MD   1 mg at 08/30/18 1240  . magnesium hydroxide (MILK OF MAGNESIA) suspension 30 mL  30 mL Oral Daily PRN Pucilowska, Jolanta B, MD      . menthol-cetylpyridinium (CEPACOL) lozenge 3 mg  1 lozenge Oral PRN Tennis Ship, MD   3 mg at 08/28/18 1957  . naphazoline-glycerin (CLEAR EYES REDNESS) ophth solution 1-2 drop  1-2 drop Both Eyes QID PRN Tennis Ship, MD   1 drop at 08/29/18 0825  . nicotine (NICODERM CQ - dosed in mg/24 hours) patch 21 mg  21 mg Transdermal Q0600 Pucilowska, Jolanta B, MD   21 mg at 08/30/18 9833  . QUEtiapine (SEROQUEL) tablet 100 mg  100 mg Oral BID Tennis Ship, MD   100 mg at 08/30/18 0825  . QUEtiapine (SEROQUEL) tablet 200 mg  200 mg Oral QHS Pucilowska, Jolanta B, MD   200 mg at 08/29/18 2131  . thiamine (VITAMIN B-1) tablet 100 mg  100 mg Oral Daily Tennis Ship, MD   100 mg at 08/30/18 0826  . traZODone (DESYREL) tablet 100 mg  100 mg Oral QHS PRN Pucilowska, Jolanta B, MD   100 mg at 08/29/18 2131    Lab Results:  Results for orders placed or performed during the hospital encounter of 08/28/18 (from the past 48 hour(s))  Urinalysis, Complete w Microscopic     Status: Abnormal   Collection Time: 08/28/18  3:48 PM  Result Value Ref Range  Color, Urine YELLOW (A) YELLOW   APPearance CLEAR (A) CLEAR   Specific Gravity, Urine 1.011 1.005 - 1.030   pH 7.0 5.0 - 8.0   Glucose, UA NEGATIVE NEGATIVE mg/dL   Hgb urine dipstick NEGATIVE NEGATIVE   Bilirubin Urine NEGATIVE NEGATIVE   Ketones, ur NEGATIVE NEGATIVE mg/dL   Protein, ur NEGATIVE NEGATIVE mg/dL   Nitrite NEGATIVE NEGATIVE    Leukocytes, UA NEGATIVE NEGATIVE   RBC / HPF 0-5 0 - 5 RBC/hpf   WBC, UA 0-5 0 - 5 WBC/hpf   Bacteria, UA NONE SEEN NONE SEEN   Squamous Epithelial / LPF 0-5 0 - 5   Mucus PRESENT     Comment: Performed at [facility], [address] [place], [place] [address]    Blood Alcohol level:  Lab Results  Component Value Date   [facility] <10 07/18/2017   ETH <10 67/61/9509    Metabolic Disorder Labs: Lab Results  Component Value Date   HGBA1C 4.9 08/28/2018   MPG 93.93 08/28/2018   MPG 88.19 11/06/2017   No results found for: PROLACTIN Lab Results  Component Value Date   CHOL 129 08/28/2018   TRIG 62 08/28/2018   HDL 61 08/28/2018   CHOLHDL 2.1 08/28/2018   VLDL 12 08/28/2018   LDLCALC 56 08/28/2018   LDLCALC 249 (H) 11/06/2017    Physical Findings: AIMS: Facial and Oral Movements Muscles of Facial Expression: None, normal Lips and Perioral Area: None, normal Jaw: None, normal Tongue: None, normal,Extremity Movements Upper (arms, wrists, hands, fingers): None, normal Lower (legs, knees, ankles, toes): None, normal, Trunk Movements Neck, shoulders, hips: None, normal, Overall Severity Severity of abnormal movements (highest score from questions above): None, normal Incapacitation due to abnormal movements: None, normal Patient's awareness of abnormal movements (rate only patient's report): No Awareness, Dental Status Current problems with teeth and/or dentures?: Yes(multiple cavities ) Does patient usually wear dentures?: No  CIWA:  CIWA-Ar Total: 1 COWS:  COWS Total Score: 2   Musculoskeletal: Strength & Muscle Tone:At baseline Gait & Station:She walks with a mild limp secondary to a history of right lower leg trauma from a prior accident. Patient leans:Walks with a limp.  Psychiatric Specialty Exam: Physical Exam  Nursing note and vitals reviewed. Constitutional: She is oriented to person, place, and time.  Neurological: She is oriented to person,  place, and time.  Skin: Skin is dry.    Review of Systems  Constitutional: Negative.   HENT: Negative.   Eyes: Negative.   Respiratory: Negative.   Cardiovascular: Negative.   Gastrointestinal: Negative.   Musculoskeletal: Negative.   Skin: Negative.   Neurological: Negative.   Endo/Heme/Allergies: Negative.     Blood pressure 116/68, pulse 79, temperature 98.1 F (36.7 C), temperature source Oral, resp. rate 18, height 5\' 6"  (1.676 m), weight 81.6 kg, SpO2 98 %.Body mass index is 29.05 kg/m.  General Appearance: Casual  Eye Contact:  Good  Speech:  Normal Rate; clear and coherent  Volume:  Normal  Mood:  Depressed  Affect:  Depressed  Thought Process:  Goal Directed and Linear  Orientation:  Full (Time, Place, and Person)  Thought Content:  Logical; goal directed  Suicidal Thoughts:  Denies SI  Homicidal Thoughts:  No - no HI towards BF today  Memory:  Good  Judgement:  Fair  Insight:  Fair  Psychomotor Activity:  Normal  Concentration:  Concentration: Fair and Attention Span: Fair  Recall:  [employer] of Knowledge:  Fair  Language:  Good  Akathisia:  No  AIMS (if indicated):   0  Assets:  Communication Skills Desire for Improvement Resilience  ADL's:  Intact  Cognition:  WNL  Sleep:  Number of Hours: 6.15     Treatment Plan Summary: Daily contact with patient to assess and evaluate symptoms and progress in treatment, Medication management and Plan See below   Patient is a 32 year old female with a history of seizures, depression and severe alcohol use disorder.  Patient reportedly relapsed on alcohol about 1-1/2 weeks ago.  Patient states her last drink was on 08/23/2018.  Patient has had success on Campral 666 mg 3 times daily in the past, but had difficulty affording it.  Patient reports feeling depressed over her relapse and also she is having discord with her boyfriend.  Patient reportedly held a gun to her head and also had thoughts of killing her boyfriend.   Patient also laments over not being able to give her children the Christmas they deserve.      Major depressive disorder, severe, recurrent without psychotic features (rule out psychotic features--pt states she has intermittent auditory hallucinations, but this is questionable;  it sounds like this may be more of her conscience)  -We will start Celexa 20 mg p.o. daily for depression.  It is not clear if psychosis is related to her thoughts but do does appear to be mood congruent. -Continue Seroquel 100 mg p.o. twice daily and 200 mg p.o. nightly -The patient also has trazodone 100 mg p.o. nightly as needed -Hemoglobin A1c was 4.9 and total cholesterol was 129  Seizure disorder -Continue Keppra 500 mg p.o. twice daily -also on neurontin 300 mg TID  (but pt states the neurontin is for neuropathic pain from a prior accident) -seizure precautions  Alcohol use disorder, severe (last drink ~08/23/18) -was on librium taper, but pt states she does better with ativan so pt placed on CIWA with Ativan. She use Ativan twice today total -Consider Campral after discharge -pt reports a hx of DT's in the past, continue to monitor; VSS -ativan IM available for seizures -daily folate and thiamine -Detox appears to be progressing fairly well.  Withdrawal symptoms have decreased. -We will continue to encourage the patient to enter meaningful recovery program at the time of discharge.  She was encouraged to abstain from alcohol as it may worsen mood symptoms and anxiety.  She is interested in going to the [facility] rescue mission now.  ? 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 -neurontin 300 mg TID  History of hypothyroidism - Continue synthroid  But she says her outpatient dosage is 136mcg po daily and not 75 mcg  History of hyperlipidemia -Continue Lipitor 10 mg daily at 1800hrs - will start low cholesterol diet  Nicotine use disorder -nicotine  patch  Dry mouth -Biotene not available but will give Gatorade   Observation Level/Precautions:  15 minute checks  Laboratory:  See orders  Psychotherapy: Groups and individual  Medications: See [facility]  Consultations: Consider OB/GYN consult if vaginal discharge symptoms do not resolve  Discharge planning: Assess for firearms prior to discharge as pt reports that she held a gun to her head prior to admission. Estimated LOS: 5 to 7 days; consider residential substance abuse treatment vs [facility]IOP     Chauncey Mann, MD 08/30/2018, 3:35 PM

## 2018-08-30 NOTE — B[facility] Group Notes (Signed)
LCSW Group Therapy Note   08/30/2018 1:15pm   Type of Therapy and Topic:  Group Therapy:  Trust and Honesty  Participation Level:  Active  Description of Group:    In this group patients will be asked to explore the value of being honest.  Patients will be guided to discuss their thoughts, feelings, and behaviors related to honesty and trusting in others. Patients will process together how trust and honesty relate to forming relationships with peers, family members, and self. Each patient will be challenged to identify and express feelings of being vulnerable. Patients will discuss reasons why people are dishonest and identify alternative outcomes if one was truthful (to self or others). This group will be process-oriented, with patients participating in exploration of their own experiences, giving and receiving support, and processing challenge from other group members.   Therapeutic Goals: 1. Patient will identify why honesty is important to relationships and how honesty overall affects relationships.  2. Patient will identify a situation where they lied or were lied too and the  feelings, thought process, and behaviors surrounding the situation 3. Patient will identify the meaning of being vulnerable, how that feels, and how that correlates to being honest with self and others. 4. Patient will identify situations where they could have told the truth, but instead lied and explain reasons of dishonesty.   Summary of Patient Progress: The patient reported that  she feels "upset." The patient was able to explore the value of being honest.  Patient discussed thoughts, feelings, and behaviors related to honesty and trusting in others. The patient processed together with other group members how trust and honesty relate to forming relationships with peers, family members, and self. Pt actively and appropriately engaged in the group. Patient was able to provide support and validation to other group  members. Patient practiced active listening when interacting with the facilitator and other group members.    Therapeutic Modalities:   Cognitive Behavioral Therapy Solution Focused Therapy Motivational Interviewing Brief Therapy  Antonia Culbertson  CUEBAS-COLON, LCSW 08/30/2018 12:02 PM

## 2018-08-30 NOTE — Plan of Care (Signed)
Patient Loc x 4. Denies pain and any suicide ideation.  Pt reported feeling depressed, irritable, anxious, with chill, tremor. Pt stated that she woke early this morning with nightmare but feel better after receiving  PRN Ativan. Pt currently sitting at dayroom with peer drawing.   Problem: Education: Goal: Knowledge of disease or condition will improve Outcome: Progressing   Problem: Health Behavior/Discharge Planning: Goal: Identification of resources available to assist in meeting health care needs will improve Outcome: Progressing   Problem: Physical Regulation: Goal: Complications related to the disease process, condition or treatment will be avoided or minimized Outcome: Progressing   Problem: Safety: Goal: Ability to remain free from injury will improve Outcome: Progressing   Problem: Education: Goal: Ability to make informed decisions regarding treatment will improve Outcome: Progressing   Problem: Coping: Goal: Coping ability will improve Outcome: Progressing   Problem: Medication: Goal: Compliance with prescribed medication regimen will improve Outcome: Progressing   Problem: Self-Concept: Goal: Ability to disclose and discuss suicidal ideas will improve Outcome: Progressing Goal: Will verbalize positive feelings about self Outcome: Progressing   Problem: Education: Goal: Understanding of discharge needs will improve Outcome: Not Progressing   Problem: Health Behavior/Discharge Planning: Goal: Ability to identify changes in lifestyle to reduce recurrence of condition will improve Outcome: Not Progressing   Problem: Health Behavior/Discharge Planning: Goal: Identification of resources available to assist in meeting health care needs will improve Outcome: Not Progressing

## 2018-08-30 NOTE — Plan of Care (Signed)
Patient is calm this evening, and responding well to treatment  no withdrawal symptoms present at this time, patient is in bed relaxed and comfortable, maintaining safety , appetite is good , mood is appropriate , affect is bright , encourage patient to engage with peers for socialization and coping abilities , compliant with medications , no noticeable side effects , 15 minute  safety rounding in progress, denies any SI,HI,AVH.   Problem: Education: Goal: Knowledge of disease or condition will improve Outcome: Progressing Goal: Understanding of discharge needs will improve Outcome: Progressing   Problem: Health Behavior/Discharge Planning: Goal: Ability to identify changes in lifestyle to reduce recurrence of condition will improve Outcome: Progressing Goal: Identification of resources available to assist in meeting health care needs will improve Outcome: Progressing   Problem: Physical Regulation: Goal: Complications related to the disease process, condition or treatment will be avoided or minimized Outcome: Progressing   Problem: Safety: Goal: Ability to remain free from injury will improve Outcome: Progressing   Problem: Education: Goal: Ability to make informed decisions regarding treatment will improve Outcome: Progressing   Problem: Coping: Goal: Coping ability will improve Outcome: Progressing   Problem: Health Behavior/Discharge Planning: Goal: Identification of resources available to assist in meeting health care needs will improve Outcome: Progressing   Problem: Medication: Goal: Compliance with prescribed medication regimen will improve Outcome: Progressing   Problem: Self-Concept: Goal: Ability to disclose and discuss suicidal ideas will improve Outcome: Progressing Goal: Will verbalize positive feelings about self Outcome: Progressing

## 2018-08-31 NOTE — Progress Notes (Signed)
[facility] MD Progress Note  08/31/2018 2:18 PM Dawn Foley  MRN:  [id number]   Subjective:    The patient has been fairly isolative to her room today.  She did not attend groups this afternoon but did attend groups yesterday.  She is denying any current auditory hallucinations or visual hallucinations.  No visible tremors and the patient has low CIWA scores.  She does not feel like she is withdrawing but per nursing, she has been requesting Ativan.  She denies any current active or passive suicidal thoughts and mood has improved.  She says she no longer wants to kill her boyfriend.  She had a good conversation with her boyfriend earlier today.  She is planning to go to her mother's house to live with her mother and children.  She denies any new somatic complaints.  Dry mouth is still present.  Vital signs are stable.  She slept fairly well last night and appetite is good.  Patient was encouraged to come out of her room more regularly.  Principal Problem: Major depressive disorder, recurrent severe without psychotic features ([place]) Diagnosis: Principal Problem:   Major depressive disorder, recurrent severe without psychotic features ([place]) Active Problems:   Alcohol use disorder, severe, dependence ([place])   Hypothyroidism   Alcohol withdrawal ([place])   Tobacco use disorder   Severe episode of recurrent major depressive disorder, without psychotic features ([place])  Total Time spent with patient, reviewing her chart, coordinating care and discussing plan of care treatment team: 20 minutes  Past Psychiatric and Substance use History: Prior psychiatric hospitalizations at [facility] and [facility].  Patient has also been admitted to [facility] in the past.  Patient reports a history of bipolar disorder, depression, severe alcohol use disorder.  Medications include Seroquel and trazodone.  Additionally the patient has been prescribed Neurontin and Campral. Patient reports that she first started  drinking at 32 years old.  Patient drinks up to half a gallon of 1/5 liquor +12-18 beers daily.  Patient admits that when she drinks, she blacks out.  She reports a history of delirium tremens and alcohol withdrawal symptoms.  Past Medical History:  Past Medical History:  Diagnosis Date  . Alcoholism ([place])   . Anemia   . Anxiety   . Arthritis   . Asthma    as a child  . Bipolar disorder ([place])   . Cancer ([place])    colon  . Chronic kidney disease   . Complication of anesthesia    woke up during colonoscopy  . Depression   . Elevated liver enzymes   . GERD (gastroesophageal reflux disease)   . H/O alcohol abuse    clean for 1 month as of 07/17/17  . Heart murmur    per [facility] per PT  . Hepatitis    denies  . Hepatitis C   . Hypothyroidism   . Mallory-Weiss tear   . Nicotine dependence   . Schizophrenia ([place])   . Seizures ([place])    seizures - most recent 05/2017, supposed to be on Tegretol but can't afford  . Thrombocytopenia ([facility])    Overview:  alcoholism  . Tibial plateau fracture, right   . Upper GI bleed     Past Surgical History:  Procedure Laterality Date  . APPENDECTOMY    . DILATION AND CURETTAGE OF UTERUS    . ESOPHAGOGASTRODUODENOSCOPY (EGD) WITH PROPOFOL N/A 09/04/2016   Procedure: ESOPHAGOGASTRODUODENOSCOPY (EGD) WITH PROPOFOL;  Surgeon: Doran Stabler, MD;  Location: [facility];  Service: Endoscopy;  Laterality: N/A;  . HARDWARE REMOVAL Right 06/18/2017   Procedure: REMOVAL RIGHT EXTERNAL FIXATOR;  Surgeon: Altamese Glassboro, MD;  Location: [facility];  Service: Orthopedics;  Laterality: Right;  . HERNIA REPAIR     umbilical hernia  . KNEE CLOSED REDUCTION Right 06/18/2017   Procedure: CLOSED MANIPULATION UNDER ANESTHESIA RIGHT KNEE;  Surgeon: Altamese Padroni, MD;  Location: [facility];  Service: Orthopedics;  Laterality: Right;  . ORIF FEMUR FRACTURE Right 07/18/2017   Procedure: REPAIR NONUNION WITH RIA;  Surgeon: Altamese Edgerton, MD;  Location: [facility];   Service: Orthopedics;  Laterality: Right;  . ORIF TIBIA PLATEAU     Family History:  Family History  Problem Relation Age of Onset  . Mental illness Other   . Thyroid disease Other   . Alcoholism Brother    Family Psychiatric  History: Reports that her aunt committed suicide via self-inflicted gunshot, patient reports a history of depression, anxiety, schizophrenia, and bipolar disorder in her family.  Denies a history of substance use disorders or alcohol use disorder in her family.  Social History:  Social History   Substance and Sexual Activity  Alcohol Use Yes  . Alcohol/week: 16.0 standard drinks  . Types: 16 Standard drinks or equivalent per week   Comment: 1/2 gallon liquor daily     Social History   Substance and Sexual Activity  Drug Use No    Social History   Socioeconomic History  . Marital status: Single    Spouse name: Not on file  . Number of children: Not on file  . Years of education: Not on file  . Highest education level: Not on file  Occupational History  . Not on file  Social Needs  . Financial resource strain: Not on file  . Food insecurity:    Worry: Not on file    Inability: Not on file  . Transportation needs:    Medical: Not on file    Non-medical: Not on file  Tobacco Use  . Smoking status: Current Every Day Smoker    Packs/day: 1.00    Types: Cigarettes  . Smokeless tobacco: Never Used  Substance and Sexual Activity  . Alcohol use: Yes    Alcohol/week: 16.0 standard drinks    Types: 16 Standard drinks or equivalent per week    Comment: 1/2 gallon liquor daily  . Drug use: No  . Sexual activity: Not Currently    Birth control/protection: None  Lifestyle  . Physical activity:    Days per week: Not on file    Minutes per session: Not on file  . Stress: Not on file  Relationships  . Social connections:    Talks on phone: Not on file    Gets together: Not on file    Attends religious service: Not on file    Active member of club  or organization: Not on file    Attends meetings of clubs or organizations: Not on file    Relationship status: Not on file  Other Topics Concern  . Not on file  Social History Narrative  . Not on file   Additional Social History: Patient reports that she is from [facility] [place] [facility]  She has 45 and 49 year old children.  She reports that she lives with her boyfriend part-time and her mother and children part-time.  Patient reports that she completed 2 semesters of college.  She would like to return to college and get her degree in criminology.  She is currently unemployed.  Sleep: Fair  Appetite:  Good  Current Medications: Current Facility-Administered Medications  Medication Dose Route Frequency Provider Last Rate Last Dose  . acetaminophen (TYLENOL) tablet 650 mg  650 mg Oral Q6H PRN Pucilowska, Jolanta B, MD   650 mg at 08/31/18 0805  . alum & mag hydroxide-simeth (MAALOX/MYLANTA) 200-200-20 MG/5ML suspension 30 mL  30 mL Oral Q4H PRN Pucilowska, Jolanta B, MD      . antiseptic oral rinse (BIOTENE) solution 15 mL  15 mL Mouth Rinse PRN Tennis Ship, MD      . atorvastatin (LIPITOR) tablet 10 mg  10 mg Oral q1800 Pucilowska, Jolanta B, MD   10 mg at 08/30/18 1702  . citalopram (CELEXA) tablet 20 mg  20 mg Oral Daily Chauncey Mann, MD   20 mg at 08/31/18 0753  . folic acid (FOLVITE) tablet 1 mg  1 mg Oral Daily Tennis Ship, MD   1 mg at 08/31/18 0753  . gabapentin (NEURONTIN) capsule 300 mg  300 mg Oral TID Pucilowska, Jolanta B, MD   300 mg at 08/31/18 1351  . hydrocerin (EUCERIN) cream   Topical PRN Tennis Ship, MD      . hydrOXYzine (ATARAX/VISTARIL) tablet 50 mg  50 mg Oral TID PRN Pucilowska, Jolanta B, MD   50 mg at 08/31/18 1409  . levETIRAcetam (KEPPRA) tablet 500 mg  500 mg Oral BID Pucilowska, Jolanta B, MD   500 mg at 08/31/18 0753  . levothyroxine (SYNTHROID, LEVOTHROID) tablet 100 mcg  100 mcg Oral Q0600 Chauncey Mann, MD   100 mcg at 08/31/18 0600  . LORazepam  (ATIVAN) injection 2 mg  2 mg Intramuscular Q6H PRN Tennis Ship, MD      . magnesium hydroxide (MILK OF MAGNESIA) suspension 30 mL  30 mL Oral Daily PRN Pucilowska, Jolanta B, MD      . menthol-cetylpyridinium (CEPACOL) lozenge 3 mg  1 lozenge Oral PRN Tennis Ship, MD   3 mg at 08/28/18 1957  . naphazoline-glycerin (CLEAR EYES REDNESS) ophth solution 1-2 drop  1-2 drop Both Eyes QID PRN Tennis Ship, MD   1 drop at 08/29/18 0825  . nicotine (NICODERM CQ - dosed in mg/24 hours) patch 21 mg  21 mg Transdermal Q0600 Pucilowska, Jolanta B, MD   21 mg at 08/31/18 0606  . QUEtiapine (SEROQUEL) tablet 100 mg  100 mg Oral BID Tennis Ship, MD   100 mg at 08/31/18 0753  . QUEtiapine (SEROQUEL) tablet 200 mg  200 mg Oral QHS Pucilowska, Jolanta B, MD   200 mg at 08/30/18 2125  . thiamine (VITAMIN B-1) tablet 100 mg  100 mg Oral Daily Tennis Ship, MD   100 mg at 08/31/18 0754  . traZODone (DESYREL) tablet 100 mg  100 mg Oral QHS PRN Pucilowska, Jolanta B, MD   100 mg at 08/30/18 2125    Lab Results:  No results found for this or any previous visit (from the past 48 hour(s)).  Blood Alcohol level:  Lab Results  Component Value Date   ETH <10 07/18/2017   ETH <10 37/62/8315    Metabolic Disorder Labs: Lab Results  Component Value Date   HGBA1C 4.9 08/28/2018   MPG 93.93 08/28/2018   MPG 88.19 11/06/2017   No results found for: PROLACTIN Lab Results  Component Value Date   CHOL 129 08/28/2018   TRIG 62 08/28/2018   HDL 61 08/28/2018   CHOLHDL 2.1 08/28/2018   VLDL 12 08/28/2018   LDLCALC 56 08/28/2018  LDLCALC 249 (H) 11/06/2017    Physical Findings: AIMS: Facial and Oral Movements Muscles of Facial Expression: None, normal Lips and Perioral Area: None, normal Jaw: None, normal Tongue: None, normal,Extremity Movements Upper (arms, wrists, hands, fingers): None, normal Lower (legs, knees, ankles, toes): None, normal, Trunk Movements Neck, shoulders, hips: None, normal,  Overall Severity Severity of abnormal movements (highest score from questions above): None, normal Incapacitation due to abnormal movements: None, normal Patient's awareness of abnormal movements (rate only patient's report): No Awareness, Dental Status Current problems with teeth and/or dentures?: Yes(multiple cavities ) Does patient usually wear dentures?: No  CIWA:  CIWA-Ar Total: 2 COWS:  COWS Total Score: 2   Musculoskeletal: Strength & Muscle Tone:At baseline Gait & Station:She walks with a mild limp secondary to a history of right lower leg trauma from a prior accident. Patient leans:Walks with a limp.  Psychiatric Specialty Exam: Physical Exam  Nursing note and vitals reviewed. Skin: Skin is dry.    Review of Systems  Constitutional: Negative.   HENT: Negative.   Eyes: Negative.   Respiratory: Negative.   Cardiovascular: Negative.   Gastrointestinal: Negative.   Musculoskeletal: Negative.   Skin: Negative.   Neurological: Negative.   Endo/Heme/Allergies: Negative.     Blood pressure 101/61, pulse 72, temperature 98.8 F (37.1 C), temperature source Oral, resp. rate 16, height 5\' 6"  (1.676 m), weight 81.6 kg, SpO2 99 %.Body mass index is 29.05 kg/m.  General Appearance: Disheveled in bed  Eye Contact:  Good  Speech:  Regular rate and rhythm, clear and coherent  Volume:  Normal  Mood:  "I feel better"  Affect:  Depressed  Thought Process:  Goal Directed and Linear  Orientation:  Full (Time, Place, and Person)  Thought Content:  Logical; goal directed  Suicidal Thoughts:  Denies SI  Homicidal Thoughts:  No - no HI towards BF today  Memory:  Good  Judgement:  Fair  Insight:  Fair  Psychomotor Activity:  Normal  Concentration:  Concentration: Fair and Attention Span: Fair  Recall:  [employer] of Knowledge:  Fair  Language:  Good  Akathisia:  No  AIMS (if indicated):   0  Assets:  Communication Skills Desire for Improvement Resilience  ADL's:  Intact   Cognition:  WNL  Sleep:  Number of Hours: 7.15     Treatment Plan Summary: Daily contact with patient to assess and evaluate symptoms and progress in treatment, Medication management and Plan See below   Patient is a 32 year old female with a history of seizures, depression and severe alcohol use disorder.  Patient reportedly relapsed on alcohol about 1-1/2 weeks ago.  Patient states her last drink was on 08/23/2018.  Patient has had success on Campral 666 mg 3 times daily in the past, but had difficulty affording it.  Patient reports feeling depressed over her relapse and also she is having discord with her boyfriend.  Patient reportedly held a gun to her head and also had thoughts of killing her boyfriend.  Patient also laments over not being able to give her children the Christmas they deserve.      Major depressive disorder, severe, recurrent without psychotic features (rule out psychotic features--pt states she has intermittent auditory hallucinations, but this is questionable;  it sounds like this may be more of her conscience)  - Will continue Celexa 20mg  po daily for depression.  It is not clear if psychosis is related to her thoughts but do does appear to be mood congruent. -We will  continue Solera: 100 mg p.o. twice daily and 200 mg p.o. nightly for mood stabilization -The patient has trazodone 100 mg p.o. nightly as needed. -Hemoglobin A1c was 4.9 and total cholesterol was 129 -  Seizure disorder -Continue Keppra 500 mg p.o. twice daily -also on neurontin 300 mg TID  (but pt states the neurontin is for neuropathic pain from a prior accident) -seizure precautions  Alcohol use disorder, severe (last drink ~08/23/18) -She was given librium but then switched to Ativan.  but pt states she does better with ativan so pt placed on CIWA with Ativan. CIWA scores are low. -Consider Campral after discharge. -pt reports a hx of DT's in the past, continue to monitor; VSS -Daily  folate and thiamine -Detox appears to be fairly well.  -We will continue to encourage the patient to enter meaningful recovery program at the time of discharge.  She was encouraged to abstain from alcohol as it may worsen mood symptoms and anxiety.  She is interested in [facility] the [facility] rescue [facility] now.  ? 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 Her outpatient dosage is 180mcg po daily and not 75 mcg  History of hyperlipidemia -Continue Lipitor 10 mg daily at 1800hrs - will start low cholesterol diet  Nicotine use disorder -nicotine patch  Dry mouth -Biotene not available but will give Gatorade   Observation Level/Precautions:  15 minute checks  Laboratory:  See orders  Psychotherapy: Groups and individual  Medications: See [facility]  Consultations: Consider OB/GYN consult if vaginal discharge symptoms do not resolve  Discharge planning: Assess for firearms prior to discharge as pt reports that she held a gun to her head prior to admission. Estimated LOS: 5 to 7 days; consider residential substance abuse treatment vs [facility]IOP     Chauncey Mann, MD 08/31/2018, 2:18 PM

## 2018-08-31 NOTE — BHH Group Notes (Signed)
LCSW Group Therapy Note 08/31/2018 1:15pm  Type of Therapy and Topic: Group Therapy: Feelings Around Returning Home & Establishing a Supportive Framework and Supporting Oneself When Supports Not Available  Participation Level: Did Not Attend  Description of Group:  Patients first processed thoughts and feelings about upcoming discharge. These included fears of upcoming changes, lack of change, new living environments, judgements and expectations from others and overall stigma of mental health issues. The group then discussed the definition of a supportive framework, what that looks and feels like, and how do to discern it from an unhealthy non-supportive network. The group identified different types of supports as well as what to do when your family/friends are less than helpful or unavailable  Therapeutic Goals  1. Patient will identify one healthy supportive network that they can use at discharge. 2. Patient will identify one factor of a supportive framework and how to tell it from an unhealthy network. 3. Patient able to identify one coping skill to use when they do not have positive supports from others. 4. Patient will demonstrate ability to communicate their needs through discussion and/or role plays.  Summary of Patient Progress:  Pt was invited to attend group but chose not to attend. CSW will continue to encourage pt to attend group throughout their admission.   Therapeutic Modalities Cognitive Behavioral Therapy Motivational Interviewing   Dawn Foley  CUEBAS-COLON, LCSW 08/31/2018 11:19 AM

## 2018-08-31 NOTE — Progress Notes (Signed)
D: Patient stated slept good last night .Stated appetite  good and energy level  normal. Stated concentration  good . Stated on Depression scale 0 , hopeless 0 and anxiety2 .( low 0-10 high) Denies suicidal  homicidal ideations  .  No auditory hallucinations  No pain concerns . Appropriate ADL'S. Interacting with peers and staff.  Patient  remained in room working on coping skills and decision making . Denies suicidal ideations . Interacting with peers and staff . Voice no concerns around wake and sleep. No safety concerns . Able to verbalize understanding of medication received . Educated to  to attend unit programming. Verbalizing understanding of information received  . Patient voice of wanting to go to Rehab facility  for extended  stay. Limited insight on placement concerns . Patient had stated she wanted long term rehab  Has said little about this of recent,  A: Encourage patient participation with unit programming . Instruction  Given on  Medication , verbalize understanding. R: Voice no other concerns. Staff continue to monitor

## 2018-09-01 MED ORDER — LEVETIRACETAM 500 MG PO TABS: 500.0000 mg | ORAL_TABLET | Freq: Two times a day (BID) | ORAL | 1 refills | Status: DC

## 2018-09-01 MED ORDER — ATORVASTATIN CALCIUM 10 MG PO TABS: 10.0000 mg | ORAL_TABLET | Freq: Every day | ORAL | 1 refills | Status: DC

## 2018-09-01 MED ORDER — GABAPENTIN 300 MG PO CAPS: 300.0000 mg | ORAL_CAPSULE | Freq: Three times a day (TID) | ORAL | 1 refills | Status: DC

## 2018-09-01 MED ORDER — LEVOTHYROXINE SODIUM 100 MCG PO TABS: 100.0000 ug | ORAL_TABLET | Freq: Every day | ORAL | 1 refills | Status: DC

## 2018-09-01 MED ORDER — CITALOPRAM HYDROBROMIDE 20 MG PO TABS: 20.0000 mg | ORAL_TABLET | Freq: Every day | ORAL | 1 refills | Status: DC

## 2018-09-01 MED ORDER — QUETIAPINE FUMARATE 100 MG PO TABS: ORAL_TABLET | ORAL | 1 refills | Status: DC

## 2018-09-01 MED ORDER — PANTOPRAZOLE SODIUM 40 MG PO TBEC: 40.0000 mg | DELAYED_RELEASE_TABLET | Freq: Every day | ORAL | 1 refills | Status: DC

## 2018-09-01 NOTE — Progress Notes (Signed)
Patient alert and oriented x 4, affect is blunted , but brightens upon interaction, patient's  thoughts are organized, no bizarre behavior noted, she makes appropriate eye contact and visible in the milieu, patient continues to be on CIWA no adverse reaction noted, complaint with mediation and denies SI/HI/AVH, 15 minutes safety checks maintained will continue to monitor.

## 2018-09-01 NOTE — Plan of Care (Signed)
Denies suicidal ideations . Interacting  with peers and staff  .   Voice no concerns around wake and sleep . No safety concerns . Able to verbalize understanding of medication received . Educated to  to attend  unit programming. Verbalizing  understanding of information received  . Patient voice of wanting to go to Rehab facility  for extended  stay. Limited insight on placement concerns   Problem: Education: Goal: Knowledge of disease or condition will improve Outcome: Progressing Goal: Understanding of discharge needs will improve Outcome: Progressing   Problem: Health Behavior/Discharge Planning: Goal: Ability to identify changes in lifestyle to reduce recurrence of condition will improve Outcome: Progressing Goal: Identification of resources available to assist in meeting health care needs will improve Outcome: Progressing   Problem: Physical Regulation: Goal: Complications related to the disease process, condition or treatment will be avoided or minimized Outcome: Progressing   Problem: Safety: Goal: Ability to remain free from injury will improve Outcome: Progressing   Problem: Education: Goal: Ability to make informed decisions regarding treatment will improve Outcome: Progressing   Problem: Coping: Goal: Coping ability will improve Outcome: Progressing   Problem: Health Behavior/Discharge Planning: Goal: Identification of resources available to assist in meeting health care needs will improve Outcome: Progressing   Problem: Medication: Goal: Compliance with prescribed medication regimen will improve Outcome: Progressing   Problem: Self-Concept: Goal: Ability to disclose and discuss suicidal ideas will improve Outcome: Progressing Goal: Will verbalize positive feelings about self Outcome: Progressing

## 2018-09-01 NOTE — BHH Suicide Risk Assessment (Signed)
[place] INPATIENT:  Family/Significant Other Suicide Prevention Education  Suicide Prevention Education:  Education Completed; Dawn Foley, mother [phone] has been identified by the patient as the family member/significant other with whom the patient will be residing, and identified as the person(s) who will aid the patient in the event of a mental health crisis (suicidal ideations/suicide attempt).  With written consent from the patient, the family member/significant other has been provided the following suicide prevention education, prior to the and/or following the discharge of the patient.  The suicide prevention education provided includes the following:  Suicide risk factors  Suicide prevention and interventions  National Suicide Hotline telephone number  [facility] assessment telephone number  [facility] Emergency Assistance [place] and/or Residential Mobile Crisis Unit telephone number  Request made of family/significant other to:  Remove weapons (e.g., guns, rifles, knives), all items previously/currently identified as safety concern.    Remove drugs/medications (over-the-counter, prescriptions, illicit drugs), all items previously/currently identified as a safety concern.  The family member/significant other verbalizes understanding of the suicide prevention education information provided.  The family member/significant other agrees to remove the items of safety concern listed above. Dawn Foley reports  pt is not allowed to have contact with her children and can not return to her home. She reports she is caring for the pt's two children. Dawn Foley reports she will contact pt's aunt to see if she will allow pt to reside with her. CSW informed Dawn Foley if pt needs further medical attention to call 911 or take her to the nearest emergency department.  Dawn Foley Dawn Foley Dawn Foley 09/01/2018, 12:50 PM

## 2018-09-01 NOTE — Progress Notes (Signed)
D: Patient stated slept good last night .Stated appetite is good and energy level  Is normal. Stated concentration is good . Stated on Depression scale 0, hopeless 0 and anxiety 0 .( low 0-10 high) Deniesl  homicidal ideations  .  No auditory hallucinations  No pain concerns . Appropriate ADL'S. Interacting with peers and staff.  . Interacting with peers and staff . Voice no concerns around wake and sleep. No safety concerns . Able to verbalize understanding of medication received .Educated toto attend unit programming. Verbalizing understanding of information received.  Limited insight on placement concerns  Voice of being ready to go home . Voice of no follow up rehab resources  A: Encourage patient participation with unit programming . Instruction  Given on  Medication , verbalize understanding. R: Voice no other concerns. Staff continue to monitor

## 2018-09-01 NOTE — Discharge Summary (Signed)
Physician Discharge Summary Note  Patient:  Dawn Foley is an 32 y.o., female MRN:  [id number] DOB:  11/14/1985 Patient phone:  [phone] (home)  Patient address:   [address] [facility] [address] [place] [address],  Total Time spent with patient: 45 minutes  Date of Admission:  08/28/2018 Date of Discharge: September 01, 2018  Reason for Admission: Admitted from [facility] because of substance abuse intoxication agitation and reported homicidal ideation.  Principal Problem: Major depressive disorder, recurrent severe without psychotic features ([place]) Discharge Diagnoses: Principal Problem:   Major depressive disorder, recurrent severe without psychotic features ([place]) Active Problems:   Alcohol use disorder, severe, dependence ([place])   Hypothyroidism   Alcohol withdrawal ([place])   Tobacco use disorder   Severe episode of recurrent major depressive disorder, without psychotic features ([place])   Past Psychiatric History: Patient has a history of alcohol abuse and mood instability.  Several prior hospitalizations.  No history of actual suicide attempts.  Has reportedly gotten benefit from staying on Seroquel in the past.  Patient has a history of alcohol abuse and reports that she has been able to having some extended sobriety in the past.  Past Medical History:  Past Medical History:  Diagnosis Date  . Alcoholism ([place])   . Anemia   . Anxiety   . Arthritis   . Asthma    as a child  . Bipolar disorder ([place])   . Cancer ([place])    colon  . Chronic kidney disease   . Complication of anesthesia    woke up during colonoscopy  . Depression   . Elevated liver enzymes   . GERD (gastroesophageal reflux disease)   . H/O alcohol abuse    clean for 1 month as of 07/17/17  . Heart murmur    per [facility] per PT  . Hepatitis    denies  . Hepatitis C   . Hypothyroidism   . Mallory-Weiss tear   . Nicotine dependence   . Schizophrenia ([place])   . Seizures ([place])    seizures - most  recent 05/2017, supposed to be on Tegretol but can't afford  . Thrombocytopenia ([facility])    Overview:  alcoholism  . Tibial plateau fracture, right   . Upper GI bleed     Past Surgical History:  Procedure Laterality Date  . APPENDECTOMY    . DILATION AND CURETTAGE OF UTERUS    . ESOPHAGOGASTRODUODENOSCOPY (EGD) WITH PROPOFOL N/A 09/04/2016   Procedure: ESOPHAGOGASTRODUODENOSCOPY (EGD) WITH PROPOFOL;  Surgeon: Doran Stabler, MD;  Location: [facility];  Service: Endoscopy;  Laterality: N/A;  . HARDWARE REMOVAL Right 06/18/2017   Procedure: REMOVAL RIGHT EXTERNAL FIXATOR;  Surgeon: Altamese Georgetown, MD;  Location: [facility];  Service: Orthopedics;  Laterality: Right;  . HERNIA REPAIR     umbilical hernia  . KNEE CLOSED REDUCTION Right 06/18/2017   Procedure: CLOSED MANIPULATION UNDER ANESTHESIA RIGHT KNEE;  Surgeon: Altamese Mio, MD;  Location: [facility];  Service: Orthopedics;  Laterality: Right;  . ORIF FEMUR FRACTURE Right 07/18/2017   Procedure: REPAIR NONUNION WITH RIA;  Surgeon: Altamese Odell, MD;  Location: [facility];  Service: Orthopedics;  Laterality: Right;  . ORIF TIBIA PLATEAU     Family History:  Family History  Problem Relation Age of Onset  . Mental illness Other   . Thyroid disease Other   . Alcoholism Brother    Family Psychiatric  History: See previous notes Social History:  Social History   Substance and Sexual Activity  Alcohol  Use Yes  . Alcohol/week: 16.0 standard drinks  . Types: 16 Standard drinks or equivalent per week   Comment: 1/2 gallon liquor daily     Social History   Substance and Sexual Activity  Drug Use No    Social History   Socioeconomic History  . Marital status: Single    Spouse name: Not on file  . Number of children: Not on file  . Years of education: Not on file  . Highest education level: Not on file  Occupational History  . Not on file  Social Needs  . Financial resource strain: Not on file  . Food insecurity:    Worry: Not on file     Inability: Not on file  . Transportation needs:    Medical: Not on file    Non-medical: Not on file  Tobacco Use  . Smoking status: Current Every Day Smoker    Packs/day: 1.00    Types: Cigarettes  . Smokeless tobacco: Never Used  Substance and Sexual Activity  . Alcohol use: Yes    Alcohol/week: 16.0 standard drinks    Types: 16 Standard drinks or equivalent per week    Comment: 1/2 gallon liquor daily  . Drug use: No  . Sexual activity: Not Currently    Birth control/protection: None  Lifestyle  . Physical activity:    Days per week: Not on file    Minutes per session: Not on file  . Stress: Not on file  Relationships  . Social connections:    Talks on phone: Not on file    Gets together: Not on file    Attends religious service: Not on file    Active member of club or organization: Not on file    Attends meetings of clubs or organizations: Not on file    Relationship status: Not on file  Other Topics Concern  . Not on file  Social History Narrative  . Not on file    Hospital Course: Patient was admitted from [facility].  As far as alcohol withdrawal she had regular protocol detox.  Did not experience any seizures.  Did not display delirium.  Had minimal symptoms of alcohol withdrawal that were addressed appropriately.  Patient has now come off all withdrawal medications and is no longer showing signs of withdrawal.  She has participated in substance abuse groups and shown good insight.  Patient states that she will follow-up with outpatient programming through her local mental [name] in order to stay sober.  As far as her mood she has not been violent or suicidal here in the hospital.  She reports the Seroquel and the Celexa are beneficial to her.  Agreeable to continuing to take these medications.  Patient's thyroid status has been stable no change to medicine.  Her seizures have been under control with no seizures in the hospital continue the [place].  Patient's case  reviewed with nursing and social work.  [facility]  States that she plans to live with her mother after discharge.  Physical Findings: AIMS: Facial and Oral Movements Muscles of Facial Expression: None, normal Lips and Perioral Area: None, normal Jaw: None, normal Tongue: None, normal,Extremity Movements Upper (arms, wrists, hands, fingers): None, normal Lower (legs, knees, ankles, toes): None, normal, Trunk Movements Neck, shoulders, hips: None, normal, Overall Severity Severity of abnormal movements (highest score from questions above): None, normal Incapacitation due to abnormal movements: None, normal Patient's awareness of abnormal movements (rate only patient's  report): No Awareness, Dental Status Current problems with teeth and/or dentures?: Yes(multiple cavities ) Does patient usually wear dentures?: No  CIWA:  CIWA-Ar Total: 0 COWS:  COWS Total Score: 2  Musculoskeletal: Strength & Muscle Tone: within normal limits Gait & Station: normal Patient leans: N/A  Psychiatric Specialty Exam: Physical Exam  Nursing note and vitals reviewed. Constitutional: She appears well-developed and well-nourished.  HENT:  Head: Normocephalic and atraumatic.  Eyes: Pupils are equal, round, and reactive to light. Conjunctivae are normal.  Neck: Normal range of motion.  Cardiovascular: Regular rhythm and normal heart sounds.  Respiratory: Effort normal.  GI: Soft.  Musculoskeletal: Normal range of motion.  Neurological: She is alert.  Skin: Skin is warm and dry.  Psychiatric: She has a normal mood and affect. Her behavior is normal. Judgment and thought content normal.    Review of Systems  Constitutional: Negative.   HENT: Negative.   Eyes: Negative.   Respiratory: Negative.   Cardiovascular: Negative.   Gastrointestinal: Negative.   Musculoskeletal: Negative.   Skin: Negative.   Neurological: Negative.   Psychiatric/Behavioral: Negative.      Blood pressure 103/62, pulse 71, temperature 98 F (36.7 C), temperature source Oral, resp. rate 18, height 5\' 6"  (1.676 m), weight 81.6 kg, SpO2 100 %.Body mass index is 29.05 kg/m.  General Appearance: Casual  Eye Contact:  Good  Speech:  Clear and Coherent  Volume:  Normal  Mood:  Euthymic  Affect:  Congruent  Thought Process:  Coherent  Orientation:  Full (Time, Place, and Person)  Thought Content:  Logical  Suicidal Thoughts:  No  Homicidal Thoughts:  No  Memory:  Immediate;   Fair Recent;   Fair Remote;   Fair  Judgement:  Fair  Insight:  Fair  Psychomotor Activity:  Normal  Concentration:  Concentration: Fair  Recall:  [place] of Knowledge:  Fair  Language:  Fair  Akathisia:  No  Handed:  Right  AIMS (if indicated):     Assets:  Communication Skills Desire for Improvement Housing Physical Health Resilience Social Support  ADL's:  Intact  Cognition:  WNL  Sleep:  Number of Hours: 7.5     Have you used any form of tobacco in the last 30 days? (Cigarettes, Smokeless Tobacco, Cigars, and/or Pipes): Yes  Has this patient used any form of tobacco in the last 30 days? (Cigarettes, Smokeless Tobacco, Cigars, and/or Pipes) Yes, No  Blood Alcohol level:  Lab Results  Component Value Date   ETH <10 07/18/2017   ETH <10 03/54/6568    Metabolic Disorder Labs:  Lab Results  Component Value Date   HGBA1C 4.9 08/28/2018   MPG 93.93 08/28/2018   MPG 88.19 11/06/2017   No results found for: PROLACTIN Lab Results  Component Value Date   CHOL 129 08/28/2018   TRIG 62 08/28/2018   HDL 61 08/28/2018   CHOLHDL 2.1 08/28/2018   VLDL 12 08/28/2018   LDLCALC 56 08/28/2018   LDLCALC 249 (H) 11/06/2017    See Psychiatric Specialty Exam and Suicide Risk Assessment completed by Attending Physician prior to discharge.  Discharge destination:  Home  Is patient on multiple antipsychotic therapies at discharge:  No   Has Patient had three or more failed trials of  antipsychotic monotherapy by history:  No  Recommended Plan for Multiple Antipsychotic Therapies: NA    Follow-up Information    [facility] Follow up.   Contact information: [place] [address] [phone]  Follow-up recommendations:  Activity:  As tolerated Diet:  Regular Other:  [facility]  Comments: Medicines will be reconciled prescriptions will be written case reviewed with social work and nursing.  Signed: Alethia Berthold, MD 09/01/2018, 11:45 AM

## 2018-09-01 NOTE — Progress Notes (Signed)
Recreation Therapy Notes  Date: 09/01/2018  Time: 9:30 am  Location: Craft Room  Behavioral response: Appropriate   Intervention Topic: Goals  Discussion/Intervention:  Group content on today was focused on goals. Patients described what goals are and how they define goals. Individuals expressed how they go about setting goals and reaching them. The group identified how important goals are and if they make short term goals to reach long term goals. Patients described how many goals they work on at a time and what affects them not reaching their goal. Individuals described how much time they put into planning and obtaining their goals. The group participated in the intervention "My Goal Board" and made personal goal boards to help them achieve their goal. Clinical Observations/Feedback:  Patient came to group and explained that she makes goals to better herself and the people she loves. Individual was social with peers and staff while participating in the intervention.  Domenica Weightman LRT/CTRS         Saraiya Kozma 09/01/2018 12:12 PM

## 2018-09-01 NOTE — Plan of Care (Signed)
  Problem: Education: Goal: Knowledge of disease or condition will improve Outcome: Progressing  Patient has knowledge of condition

## 2018-09-01 NOTE — BHH Suicide Risk Assessment (Signed)
[facility] Discharge Suicide Risk Assessment   Principal Problem: Major depressive disorder, recurrent severe without psychotic features ([place]) Discharge Diagnoses: Principal Problem:   Major depressive disorder, recurrent severe without psychotic features ([place]) Active Problems:   Alcohol use disorder, severe, dependence ([place])   Hypothyroidism   Alcohol withdrawal ([place])   Tobacco use disorder   Severe episode of recurrent major depressive disorder, without psychotic features ([place])   Total Time spent with patient: 45 minutes  Musculoskeletal: Strength & Muscle Tone: within normal limits Gait & Station: normal Patient leans: N/A  Psychiatric Specialty Exam: Review of Systems  Constitutional: Negative.   HENT: Negative.   Eyes: Negative.   Respiratory: Negative.   Cardiovascular: Negative.   Gastrointestinal: Negative.   Musculoskeletal: Negative.   Skin: Negative.   Neurological: Negative.   Psychiatric/Behavioral: Negative.     Blood pressure 103/62, pulse 71, temperature 98 F (36.7 C), temperature source Oral, resp. rate 18, height 5\' 6"  (1.676 m), weight 81.6 kg, SpO2 100 %.Body mass index is 29.05 kg/m.  General Appearance: Casual  Eye Contact::  Good  Speech:  Clear and Coherent409  Volume:  Normal  Mood:  Euthymic  Affect:  Appropriate and Congruent  Thought Process:  Coherent  Orientation:  Full (Time, Place, and Person)  Thought Content:  Logical  Suicidal Thoughts:  No  Homicidal Thoughts:  No  Memory:  Immediate;   Fair Recent;   Fair Remote;   Fair  Judgement:  Fair  Insight:  Fair  Psychomotor Activity:  Normal  Concentration:  Fair  Recall:  [employer] [place]  Language: Fair  Akathisia:  No  Handed:  Right  AIMS (if indicated):     Assets:  Communication Skills Desire for Improvement Housing Resilience Social Support  Sleep:  Number of Hours: 7.5  Cognition: WNL  ADL's:  Intact   Mental Status Per Nursing Assessment::   On  Admission:  NA  Demographic Factors:  Caucasian and Unemployed  Loss Factors: Loss of significant relationship  Historical Factors: Impulsivity  Risk Reduction Factors:   Responsible for children under 81 years of age, Sense of responsibility to family, Religious beliefs about death, Living with another person, especially a relative, Positive social support and Positive therapeutic relationship  Continued Clinical Symptoms:  Depression:   Impulsivity Alcohol/Substance Abuse/Dependencies  Cognitive Features That Contribute To Risk:  Loss of executive function    Suicide Risk:  Minimal: No identifiable suicidal ideation.  Patients presenting with no risk factors but with morbid ruminations; may be classified as minimal risk based on the severity of the depressive symptoms  Follow-up [place] Follow up.   Contact information: [place] [address] [phone]           Plan Of Care/Follow-up recommendations:  Activity:  as tolerated Diet:  regular Other:  follow up with Georgian Co, MD 09/01/2018, 11:42 AM

## 2018-09-02 NOTE — Progress Notes (Signed)
Recreation Therapy Notes  INPATIENT RECREATION TR PLAN  Patient Details Name: Dawn Foley MRN: [id number] DOB: [birth year] Today's Date: 09/02/2018  Rec Therapy Plan Is patient appropriate for Therapeutic Recreation?: Yes Treatment times per week: at least 3 Estimated Length of Stay: 5-7 days TR Treatment/Interventions: Group participation (Comment)  Discharge Criteria Pt will be discharged from therapy if:: Discharged Treatment plan/goals/alternatives discussed and agreed upon by:: Patient/family  Discharge Summary Short term goals set: Patient will identify 3 positive coping skills to decrease depressive symptoms within 5 recreation therapy group sessions Short term goals met: Not met Progress toward goals comments: Groups attended Which groups?: Goal setting Reason goals not met: Patient spent most of her time in her room Therapeutic equipment acquired: N/A Reason patient discharged from therapy: Discharge from hospital Pt/family agrees with progress & goals achieved: Yes Date patient discharged from therapy: 09/02/18   Rodric Punch 09/02/2018, 11:30 AM

## 2018-09-02 NOTE — Progress Notes (Signed)
Recreation Therapy Notes    Date: 09/02/2018  Time: 9:30 am   Location: Craft room   Behavioral response: N/A   Intervention Topic: Strengths  Discussion/Intervention: Patient did not attend group.   Clinical Observations/Feedback:  Patient did not attend group.   Dawn Foley LRT/CTRS         [name] 09/02/2018 11:27 AM

## 2018-09-02 NOTE — Progress Notes (Signed)
Patient ID: Dawn Foley, female   DOB: [birth year], 32 y.o.   MRN: [id number]   Discharge Note:  Patient denies SI/HI/AVH at this time. Discharge instructions, AVS, prescriptions, and transition record gone over with patient. Patient agrees to comply with medication management, follow-up visit, and outpatient therapy. Patient belongings returned to patient. Patient questions and concerns addressed and answered. Patient ambulatory off unit. Patient discharged to home via [name] taxicab services.

## 2018-09-02 NOTE — Plan of Care (Signed)
Cooperative with treatment, interacted well with peers and staff on unit. No behavioral issues to report on shift at this time. CIWA on shift scored as a 0.

## 2018-09-02 NOTE — Progress Notes (Signed)
Patient ID: Dawn Foley, female   DOB: May 16, 1986, 32 y.o.   MRN: [id number] Patient seen.  Discharge order was written for today but she had not yet left the unit this morning.  Patient had no complaints.  Continued to report that her mood was good.  She appeared to be lucid upbeat and optimistic.  Case reviewed with nursing patient encouraged to get up and take advantage of the tickets we have for her to get home.  Unfortunately, by mistake I overlooked that she needed a 7-day supply of medication.  At this point it would not be possible to get that done today nor tomorrow as it is Christmas day.  Told nursing that we would give her a 7-day supply which we could get on Thursday if she wanted to come back and pick it up.  Otherwise prescriptions are all written for outpatient medication.

## 2018-10-21 ENCOUNTER — Inpatient Hospital Stay (HOSPITAL_COMMUNITY)
Admission: AD | Admit: 2018-10-21 | Discharge: 2018-10-27 | DRG: 885 | Disposition: A | Discharge status: Home / Self Care | Payer: Federal, State, Local not specified - Other | Source: Intra-hospital | Attending: Psychiatry | Admitting: Psychiatry

## 2018-10-21 ENCOUNTER — Encounter (HOSPITAL_COMMUNITY): Payer: Self-pay | Admitting: *Deleted

## 2018-10-21 ENCOUNTER — Other Ambulatory Visit: Payer: Self-pay | Admitting: Registered Nurse

## 2018-10-21 ENCOUNTER — Other Ambulatory Visit: Payer: Self-pay

## 2018-10-21 DIAGNOSIS — R45851 Suicidal ideations: Secondary | ICD-10-CM | POA: Diagnosis present

## 2018-10-21 DIAGNOSIS — G40909 Epilepsy, unspecified, not intractable, without status epilepticus: Secondary | ICD-10-CM | POA: Diagnosis present

## 2018-10-21 DIAGNOSIS — Z811 Family history of alcohol abuse and dependence: Secondary | ICD-10-CM

## 2018-10-21 DIAGNOSIS — E039 Hypothyroidism, unspecified: Secondary | ICD-10-CM | POA: Diagnosis present

## 2018-10-21 DIAGNOSIS — F10229 Alcohol dependence with intoxication, unspecified: Secondary | ICD-10-CM | POA: Diagnosis present

## 2018-10-21 DIAGNOSIS — G8929 Other chronic pain: Secondary | ICD-10-CM | POA: Diagnosis present

## 2018-10-21 DIAGNOSIS — I959 Hypotension, unspecified: Secondary | ICD-10-CM | POA: Diagnosis present

## 2018-10-21 DIAGNOSIS — Z85038 Personal history of other malignant neoplasm of large intestine: Secondary | ICD-10-CM

## 2018-10-21 DIAGNOSIS — F17210 Nicotine dependence, cigarettes, uncomplicated: Secondary | ICD-10-CM | POA: Diagnosis present

## 2018-10-21 DIAGNOSIS — F419 Anxiety disorder, unspecified: Secondary | ICD-10-CM | POA: Diagnosis present

## 2018-10-21 DIAGNOSIS — E785 Hyperlipidemia, unspecified: Secondary | ICD-10-CM | POA: Diagnosis present

## 2018-10-21 DIAGNOSIS — N760 Acute vaginitis: Secondary | ICD-10-CM | POA: Diagnosis present

## 2018-10-21 DIAGNOSIS — F322 Major depressive disorder, single episode, severe without psychotic features: Secondary | ICD-10-CM

## 2018-10-21 DIAGNOSIS — K219 Gastro-esophageal reflux disease without esophagitis: Secondary | ICD-10-CM | POA: Diagnosis present

## 2018-10-21 DIAGNOSIS — F603 Borderline personality disorder: Secondary | ICD-10-CM | POA: Diagnosis present

## 2018-10-21 DIAGNOSIS — F332 Major depressive disorder, recurrent severe without psychotic features: Principal | ICD-10-CM | POA: Diagnosis present

## 2018-10-21 DIAGNOSIS — G4700 Insomnia, unspecified: Secondary | ICD-10-CM | POA: Diagnosis present

## 2018-10-21 DIAGNOSIS — Z79899 Other long term (current) drug therapy: Secondary | ICD-10-CM

## 2018-10-21 DIAGNOSIS — F209 Schizophrenia, unspecified: Secondary | ICD-10-CM | POA: Diagnosis present

## 2018-10-21 DIAGNOSIS — Z79890 Hormone replacement therapy (postmenopausal): Secondary | ICD-10-CM

## 2018-10-21 DIAGNOSIS — J45909 Unspecified asthma, uncomplicated: Secondary | ICD-10-CM | POA: Diagnosis present

## 2018-10-21 MED ORDER — VITAMIN B-1 100 MG PO TABS
100.0000 mg | ORAL_TABLET | Freq: Every day | ORAL | Status: DC
  Administered 2018-10-22 – 2018-10-27 (×6): 100 mg via ORAL
  Filled 2018-10-21 (×8): qty 1

## 2018-10-21 MED ORDER — CHLORDIAZEPOXIDE HCL 25 MG PO CAPS
25.0000 mg | ORAL_CAPSULE | Freq: Four times a day (QID) | ORAL | Status: DC
  Administered 2018-10-21 – 2018-10-22 (×3): 25 mg via ORAL
  Filled 2018-10-21 (×3): qty 1

## 2018-10-21 MED ORDER — GABAPENTIN 300 MG PO CAPS
600.0000 mg | ORAL_CAPSULE | Freq: Three times a day (TID) | ORAL | Status: DC
  Administered 2018-10-21 – 2018-10-27 (×18): 600 mg via ORAL
  Filled 2018-10-21 (×23): qty 2

## 2018-10-21 MED ORDER — ZIPRASIDONE MESYLATE 20 MG IM SOLR
20.0000 mg | Freq: Once | INTRAMUSCULAR | Status: AC
  Administered 2018-10-21: 20 mg via INTRAMUSCULAR
  Filled 2018-10-21 (×2): qty 20

## 2018-10-21 MED ORDER — LEVOTHYROXINE SODIUM 100 MCG PO TABS
100.0000 ug | ORAL_TABLET | Freq: Every day | ORAL | Status: DC
  Administered 2018-10-22 – 2018-10-27 (×6): 100 ug via ORAL
  Filled 2018-10-21 (×8): qty 1

## 2018-10-21 MED ORDER — CHLORDIAZEPOXIDE HCL 25 MG PO CAPS: 25.0000 mg | ORAL_CAPSULE | Freq: Three times a day (TID) | ORAL | Status: DC

## 2018-10-21 MED ORDER — BIOTENE DRY MOUTH MT LIQD
15.0000 mL | OROMUCOSAL | Status: DC | PRN
  Administered 2018-10-21 – 2018-10-26 (×9): 15 mL via OROMUCOSAL
  Filled 2018-10-21: qty 237

## 2018-10-21 MED ORDER — ACAMPROSATE CALCIUM 333 MG PO TBEC
666.0000 mg | DELAYED_RELEASE_TABLET | Freq: Three times a day (TID) | ORAL | Status: DC
  Administered 2018-10-21 – 2018-10-27 (×18): 666 mg via ORAL
  Filled 2018-10-21 (×24): qty 2

## 2018-10-21 MED ORDER — LOPERAMIDE HCL 2 MG PO CAPS: 2.0000 mg | ORAL_CAPSULE | ORAL | Status: AC | PRN

## 2018-10-21 MED ORDER — IBUPROFEN 400 MG PO TABS
400.0000 mg | ORAL_TABLET | ORAL | Status: DC | PRN
  Administered 2018-10-21 – 2018-10-25 (×6): 400 mg via ORAL
  Filled 2018-10-21 (×2): qty 1
  Filled 2018-10-21 (×2): qty 2
  Filled 2018-10-21: qty 1

## 2018-10-21 MED ORDER — NICOTINE 21 MG/24HR TD PT24
21.0000 mg | MEDICATED_PATCH | Freq: Every day | TRANSDERMAL | Status: DC
  Administered 2018-10-21 – 2018-10-27 (×7): 21 mg via TRANSDERMAL
  Filled 2018-10-21 (×10): qty 1

## 2018-10-21 MED ORDER — CHLORDIAZEPOXIDE HCL 25 MG PO CAPS: 25.0000 mg | ORAL_CAPSULE | Freq: Four times a day (QID) | ORAL | Status: DC | PRN

## 2018-10-21 MED ORDER — TRAZODONE HCL 100 MG PO TABS
100.0000 mg | ORAL_TABLET | Freq: Every evening | ORAL | Status: DC | PRN
  Administered 2018-10-21 – 2018-10-23 (×5): 100 mg via ORAL
  Filled 2018-10-21 (×12): qty 1

## 2018-10-21 MED ORDER — HYDROCERIN EX CREA
TOPICAL_CREAM | Freq: Two times a day (BID) | CUTANEOUS | Status: DC
  Administered 2018-10-21 – 2018-10-23 (×5): via TOPICAL
  Administered 2018-10-24: 1 via TOPICAL
  Administered 2018-10-24: 08:00:00 via TOPICAL
  Administered 2018-10-25: 1 via TOPICAL
  Administered 2018-10-25 – 2018-10-27 (×4): via TOPICAL
  Filled 2018-10-21: qty 113

## 2018-10-21 MED ORDER — CHLORDIAZEPOXIDE HCL 25 MG PO CAPS: 25.0000 mg | ORAL_CAPSULE | ORAL | Status: DC

## 2018-10-21 MED ORDER — HYDROXYZINE HCL 25 MG PO TABS
25.0000 mg | ORAL_TABLET | Freq: Four times a day (QID) | ORAL | Status: AC | PRN
  Administered 2018-10-22 – 2018-10-23 (×2): 25 mg via ORAL
  Filled 2018-10-21 (×2): qty 1

## 2018-10-21 MED ORDER — PANTOPRAZOLE SODIUM 40 MG PO TBEC
40.0000 mg | DELAYED_RELEASE_TABLET | Freq: Every day | ORAL | Status: DC
  Administered 2018-10-21 – 2018-10-27 (×7): 40 mg via ORAL
  Filled 2018-10-21 (×10): qty 1

## 2018-10-21 MED ORDER — POLYVINYL ALCOHOL 1.4 % OP SOLN
1.0000 [drp] | OPHTHALMIC | Status: DC | PRN
  Administered 2018-10-21 – 2018-10-27 (×6): 1 [drp] via OPHTHALMIC

## 2018-10-21 MED ORDER — CHLORDIAZEPOXIDE HCL 25 MG PO CAPS: 25.0000 mg | ORAL_CAPSULE | Freq: Every day | ORAL | Status: DC

## 2018-10-21 NOTE — Tx Team (Signed)
Initial Treatment Plan 10/21/2018 2:32 PM Dawn Foley HBZ:[id number]    PATIENT STRESSORS: Medication change or noncompliance Substance abuse   PATIENT STRENGTHS: Ability for insight Average or above average intelligence General fund of knowledge Motivation for treatment/growth   PATIENT IDENTIFIED PROBLEMS: Depression Suicidal thoughts Alcohol abuse "I want to get detoxed"                     DISCHARGE CRITERIA:  Ability to meet basic life and health needs Improved stabilization in mood, thinking, and/or behavior Reduction of life-threatening or endangering symptoms to within safe limits Verbal commitment to aftercare and medication compliance Withdrawal symptoms are absent or subacute and managed without 24-hour nursing intervention  PRELIMINARY DISCHARGE PLAN: Attend aftercare/continuing care group  PATIENT/FAMILY INVOLVEMENT: This treatment plan has been presented to and reviewed with the patient, Dawn Foley, and/or family member, .  The patient and family have been given the opportunity to ask questions and make suggestions.  [place], [place], [place] 10/21/2018, 2:32 PM
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
## 2018-10-21 NOTE — Progress Notes (Signed)
Dawn Foley is a 33 year old female pt admitted from [facility]. On admission, she endorses that she has been feeling depressed and suicidal and reports that she has been drinking alcohol on a daily basis and is wanting to get detoxed and is able to contract for safety while in the hospital. She does display some tremors while in the admission process however the tremors are not noticeable when she is using a pen to write things down. She reports that she hasn't been taking all her medications as she should because she cannot afford some of them. She denies any drug usage. She reports that she currently lives in [place] with her mother and children. She reports that she thinks she may like to get into a treatment facility after discharge. Eilyn was escorted to the unit, oriented to the milieu and safety maintained.
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
## 2018-10-22 MED ORDER — FLUCONAZOLE 150 MG PO TABS
150.0000 mg | ORAL_TABLET | Freq: Every day | ORAL | Status: DC
  Administered 2018-10-22: 150 mg via ORAL
  Filled 2018-10-22 (×3): qty 1

## 2018-10-22 MED ORDER — ATORVASTATIN CALCIUM 10 MG PO TABS
10.0000 mg | ORAL_TABLET | Freq: Every day | ORAL | Status: DC
  Administered 2018-10-22 – 2018-10-26 (×5): 10 mg via ORAL
  Filled 2018-10-22 (×7): qty 1

## 2018-10-22 MED ORDER — LEVETIRACETAM 500 MG PO TABS
500.0000 mg | ORAL_TABLET | Freq: Two times a day (BID) | ORAL | Status: DC
  Administered 2018-10-22 – 2018-10-27 (×11): 500 mg via ORAL
  Filled 2018-10-22 (×17): qty 1

## 2018-10-22 MED ORDER — ZIPRASIDONE HCL 40 MG PO CAPS
40.0000 mg | ORAL_CAPSULE | Freq: Two times a day (BID) | ORAL | Status: DC
  Administered 2018-10-22 (×2): 40 mg via ORAL
  Filled 2018-10-22 (×7): qty 1

## 2018-10-22 MED ORDER — PRIMIDONE 50 MG PO TABS
50.0000 mg | ORAL_TABLET | Freq: Three times a day (TID) | ORAL | Status: DC
  Administered 2018-10-22 – 2018-10-23 (×3): 50 mg via ORAL
  Filled 2018-10-22 (×7): qty 1

## 2018-10-22 MED ORDER — METRONIDAZOLE 500 MG PO TABS: 500.0000 mg | ORAL_TABLET | Freq: Once | ORAL | Status: DC

## 2018-10-22 NOTE — BHH Counselor (Signed)
Dawn Foley requested assistance with sober living homes in [place]. This Probation officer provided a [facility] in [place] to [place].  Lawana Pai, MSW Intern [place] Department 10/22/2018 4:00 PM

## 2018-10-22 NOTE — Progress Notes (Signed)
Patient ID: Dawn Foley, female   DOB: 03/06/1986, 33 y.o.   MRN: [id number] D: Patient alert and cooperative. Pt detoxing from [place] and c/o tremors, and anxiety. Pt mood/affect is anxious and sad. Pt denies SI/HI/AVH. No acute distressed noted at this time.   A: Medications administered as prescribed. Emotional support given and will continue to monitor pt's progress for stabilization.  R: Patient remains safe and complaint with medications.

## 2018-10-22 NOTE — Tx Team (Signed)
Interdisciplinary Treatment and Diagnostic Plan Update  10/22/2018 Time of Session: 9:10 AM Dawn Foley MRN: [id number]  Principal Diagnosis: <principal problem not specified>  Secondary Diagnoses: Active Problems:   MDD (major depressive disorder), severe (HCC)   Current Medications:  Current Facility-Administered Medications  Medication Dose Route Frequency Provider Last Rate Last Dose   acamprosate (CAMPRAL) tablet 666 mg  666 mg Oral TID WC Johnn Hai, MD   666 mg at 10/22/18 1126   antiseptic oral rinse (BIOTENE) solution 15 mL  15 mL Mouth Rinse PRN Johnn Hai, MD   15 mL at 10/22/18 1302   atorvastatin (LIPITOR) tablet 10 mg  10 mg Oral q1800 Johnn Hai, MD       gabapentin (NEURONTIN) capsule 600 mg  600 mg Oral TID Lindell Spar I, NP   600 mg at 10/22/18 1126   hydrocerin (EUCERIN) cream   Topical BID Johnn Hai, MD       hydrOXYzine (ATARAX/VISTARIL) tablet 25 mg  25 mg Oral Q6H PRN Lindell Spar I, NP       ibuprofen (ADVIL,MOTRIN) tablet 400 mg  400 mg Oral Q4H PRN Lindell Spar I, NP   400 mg at 10/22/18 0835   levETIRAcetam (KEPPRA) tablet 500 mg  500 mg Oral BID Johnn Hai, MD   500 mg at 10/22/18 1127   levothyroxine (SYNTHROID, LEVOTHROID) tablet 100 mcg  100 mcg Oral Q0600 Lindell Spar I, NP   100 mcg at 10/22/18 4098   loperamide (IMODIUM) capsule 2-4 mg  2-4 mg Oral PRN Lindell Spar I, NP       nicotine (NICODERM CQ - dosed in mg/24 hours) patch 21 mg  21 mg Transdermal Daily Johnn Hai, MD   21 mg at 10/22/18 0731   pantoprazole (PROTONIX) EC tablet 40 mg  40 mg Oral Daily Lindell Spar I, NP   40 mg at 10/22/18 0731   polyvinyl alcohol (LIQUIFILM TEARS) 1.4 % ophthalmic solution 1 drop  1 drop Both Eyes PRN Johnn Hai, MD   1 drop at 10/21/18 2107   primidone (MYSOLINE) tablet 50 mg  50 mg Oral TID Johnn Hai, MD   50 mg at 10/22/18 1126   thiamine (VITAMIN B-1) tablet 100 mg  100 mg Oral Daily Lindell Spar I, NP   100 mg at  10/22/18 0731   traZODone (DESYREL) tablet 100 mg  100 mg Oral QHS,MR X 1 Patriciaann Clan E, PA-C   100 mg at 10/22/18 0040   ziprasidone (GEODON) capsule 40 mg  40 mg Oral BID WC Johnn Hai, MD   40 mg at 10/22/18 1127   PTA Medications: Medications Prior to Admission  Medication Sig Dispense Refill Last Dose   gabapentin (NEURONTIN) 300 MG capsule Take 600 mg by mouth 3 (three) times daily.      haloperidol (HALDOL) 5 MG tablet Take 1 tablet by mouth 2 (two) times daily.      Oxcarbazepine (TRILEPTAL) 300 MG tablet Take 1 tablet by mouth 2 (two) times daily.      QUEtiapine (SEROQUEL) 200 MG tablet Take 200 mg by mouth at bedtime.      levETIRAcetam (KEPPRA) 500 MG tablet Take 1 tablet (500 mg total) by mouth 2 (two) times daily. 60 tablet 1    levothyroxine (SYNTHROID, LEVOTHROID) 100 MCG tablet Take 1 tablet (100 mcg total) by mouth daily at 6 (six) AM. 30 tablet 1    pantoprazole (PROTONIX) 40 MG tablet Take 1 tablet (40 mg total) by mouth  daily. 30 tablet 1    QUEtiapine (SEROQUEL) 100 MG tablet For mood control take 1 pill in the morning and 2 pills at night (Patient taking differently: 100 mg 2 (two) times daily. ) 90 tablet 1     Patient Stressors: Medication change or noncompliance Substance abuse  Patient Strengths: Ability for insight Average or above average intelligence General fund of knowledge Motivation for treatment/growth  Treatment Modalities: Medication Management, Group therapy, Case management,  1 to 1 session with clinician, Psychoeducation, Recreational therapy.   Physician Treatment Plan for Primary Diagnosis: <principal problem not specified> Long Term Goal(s): Improvement in symptoms so as ready for discharge Improvement in symptoms so as ready for discharge   Short Term Goals: Ability to maintain clinical measurements within normal limits will improve Compliance with prescribed medications will improve Ability to identify triggers associated  with substance abuse/mental health issues will improve Ability to verbalize feelings will improve Ability to disclose and discuss suicidal ideas  Medication Management: Evaluate patient's response, side effects, and tolerance of medication regimen.  Therapeutic Interventions: 1 to 1 sessions, Unit Group sessions and Medication administration.  Evaluation of Outcomes: Not Progressing  Physician Treatment Plan for Secondary Diagnosis: Active Problems:   MDD (major depressive disorder), severe ([place])  Long Term Goal(s): Improvement in symptoms so as ready for discharge Improvement in symptoms so as ready for discharge   Short Term Goals: Ability to maintain clinical measurements within normal limits will improve Compliance with prescribed medications will improve Ability to identify triggers associated with substance abuse/mental health issues will improve Ability to verbalize feelings will improve Ability to disclose and discuss suicidal ideas     Medication Management: Evaluate patient's response, side effects, and tolerance of medication regimen.  Therapeutic Interventions: 1 to 1 sessions, Unit Group sessions and Medication administration.  Evaluation of Outcomes: Not Progressing   RN Treatment Plan for Primary Diagnosis: <principal problem not specified> Long Term Goal(s): Knowledge of disease and therapeutic regimen to maintain health will improve  Short Term Goals: Ability to remain free from injury will improve and Compliance with prescribed medications will improve  Medication Management: RN will administer medications as ordered by provider, will assess and evaluate patient's response and provide education to patient for prescribed medication. RN will report any adverse and/or side effects to prescribing provider.  Therapeutic Interventions: 1 on 1 counseling sessions, Psychoeducation, Medication administration, Evaluate responses to treatment, Monitor vital signs and CBGs  as ordered, Perform/monitor CIWA, COWS, AIMS and Fall Risk screenings as ordered, Perform wound care treatments as ordered.  Evaluation of Outcomes: Not Progressing   LCSW Treatment Plan for Primary Diagnosis: <principal problem not specified> Long Term Goal(s): Safe transition to appropriate next level of care at discharge, Engage patient in therapeutic group addressing interpersonal concerns.  Short Term Goals: Engage patient in aftercare planning with referrals and resources, Increase social support and Increase skills for wellness and recovery  Therapeutic Interventions: Assess for all discharge needs, 1 to 1 time with Social worker, Explore available resources and support systems, Assess for adequacy in community support network, Educate family and significant other(s) on suicide prevention, Complete Psychosocial Assessment, Interpersonal group therapy.  Evaluation of Outcomes: Not Progressing   Progress in Treatment: Attending groups: No. Participating in groups: No. Taking medication as prescribed: Yes. Toleration medication: Yes. Family/Significant other contact made: Yes, individual(s) contacted:  Sandi Mariscal, Mother, [phone] Patient understands diagnosis: Yes. Discussing patient identified problems/goals with staff: Yes. Medical problems stabilized or resolved: Yes. Denies suicidal/homicidal ideation: No. Issues/concerns  per patient self-inventory: No. Other:   New problem(s) identified: No, Describe:  None identified  New Short Term/Long Term Goal(s):  Patient Goals:  "Feel better with a medication to help stop drinking"  Discharge Plan or Barriers: Unsheltered  Reason for Continuation of Hospitalization: Depression Medication stabilization Suicidal ideation  Estimated Length of Stay: 3-5 days  Attendees: Patient: Dawn Foley 10/22/2018 3:02 PM  Physician: Johnn Hai, MD 10/22/2018 3:02 PM  Nursing: Elesa Massed, RN 10/22/2018 3:02 PM  RN Care Manager:  10/22/2018 3:02 PM  Social Worker: Lurline Idol, [place] 10/22/2018 3:02 PM  Recreational Therapist:  10/22/2018 3:02 PM  Other: Lawana Pai, MSW Intern 10/22/2018 3:02 PM  Other:  10/22/2018 3:02 PM  Other: 10/22/2018 3:02 PM    Scribe for Treatment Team: Lawana Pai, MSW Intern [place] Department 10/22/2018 3:02 PM

## 2018-10-22 NOTE — BHH Counselor (Signed)
Z[facility] requested clothes from donations. Requested [place] [employer] to follow up on shirt and pants for patient.  Lawana Pai, MSW Intern [facility] Department 10/22/2018 10:40 AM
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
## 2018-10-22 NOTE — Progress Notes (Signed)
D: Pt stated she was SI/ HI/ AVH- contracts for safety. Pt is pleasant and cooperative. Pt stated she was feeling tired from the [place], pt encouraged to give her body time to get used to the medication. Pt woke up this evening and got a snack and took her medications and went back to sleep.    A: Pt was offered support and encouragement. Pt was given scheduled medications. Pt was encourage to attend groups. Q 15 minute checks were done for safety.   R: safety maintained on unit.  Problem: Education: Goal: Emotional status will improve Outcome: Progressing   Problem: Education: Goal: Mental status will improve Outcome: Progressing   Problem: Activity: Goal: Sleeping patterns will improve Outcome: Progressing

## 2018-10-22 NOTE — Progress Notes (Signed)
Patient has been compliant with medications.  Patient denies SI, HI and AVH. Patient has been complaining of withdrawal symptoms such as feeling flushed and upset stomach.    Assess patient for safety, offer medications as prescribed, engage patient in 1:1 staff talks.   Patient able to contract for safety.  Continue to monitor as planned.

## 2018-10-22 NOTE — Progress Notes (Signed)
[facility] MD Progress Note  10/22/2018 8:26 AM Dawn Foley  MRN:  [id number] Subjective:    Patient is seen she has a list of concerns questions inquiry so forth firstly she wants to switch her detox regimen states she always has withdrawal for several days and does better with Ativan then Librium however again she was recently detoxed had a 1 day binge and states she is still in withdrawal which is unlikely her vitals are generally stable her pulse is up probably due to anxiety as her blood pressure is normal. She asks about Seroquel we told her probably not the best option because of weight gain She asks about her Keppra which we do need to restart due to her seizure history She asked about being on Lipitor Further she states she got a shot of Geodon and found to be very calm and and she felt normal for period of time and would like to be on oral Geodon which certainly can be accommodated She does not have current thoughts of harming herself We did come to a compromise regarding her detox regimen were going to use Mysoline given its protection from seizures and long half-life As this is off label we do want to avoid benzodiazepines She does seem to be participating cognitive therapy and she does seem motivated to improve  Principal Problem: Alcoholism/chronic depression/self-harm gesture/borderline personality Diagnosis: Active Problems:   MDD (major depressive disorder), severe ([place])  Total Time spent with patient: 20 minutes   Past Medical History:  Past Medical History:  Diagnosis Date  . Alcoholism ([place])   . Anemia   . Anxiety   . Arthritis   . Asthma    as a child  . Bipolar disorder ([place])   . Cancer ([place])    colon  . Chronic kidney disease   . Complication of anesthesia    woke up during colonoscopy  . Depression   . Elevated liver enzymes   . GERD (gastroesophageal reflux disease)   . H/O alcohol abuse    clean for 1 month as of 07/17/17  . Heart murmur    per  [facility] per PT  . Hepatitis    denies  . Hepatitis C   . Hypothyroidism   . Mallory-Weiss tear   . Nicotine dependence   . Schizophrenia ([place])   . Seizures ([place])    seizures - most recent 05/2017, supposed to be on Tegretol but can't afford  . Thrombocytopenia ([facility])    Overview:  alcoholism  . Tibial plateau fracture, right   . Upper GI bleed     Past Surgical History:  Procedure Laterality Date  . APPENDECTOMY    . DILATION AND CURETTAGE OF UTERUS    . ESOPHAGOGASTRODUODENOSCOPY (EGD) WITH PROPOFOL N/A 09/04/2016   Procedure: ESOPHAGOGASTRODUODENOSCOPY (EGD) WITH PROPOFOL;  Surgeon: Doran Stabler, MD;  Location: [place];  Service: Endoscopy;  Laterality: N/A;  . HARDWARE REMOVAL Right 06/18/2017   Procedure: REMOVAL RIGHT EXTERNAL FIXATOR;  Surgeon: Altamese Bellevue, MD;  Location: [place];  Service: Orthopedics;  Laterality: Right;  . HERNIA REPAIR     umbilical hernia  . KNEE CLOSED REDUCTION Right 06/18/2017   Procedure: CLOSED MANIPULATION UNDER ANESTHESIA RIGHT KNEE;  Surgeon: Altamese [place], MD;  Location: [place];  Service: Orthopedics;  Laterality: Right;  . ORIF FEMUR FRACTURE Right 07/18/2017   Procedure: REPAIR NONUNION WITH RIA;  Surgeon: Altamese [place]ton, MD;  Location: [place];  Service: Orthopedics;  Laterality: Right;  . ORIF  TIBIA PLATEAU     Family History:  Family History  Problem Relation Age of Onset  . Mental illness Other   . Thyroid disease Other   . Alcoholism Brother    Family Psychiatric  History: no new data today Social History:  Social History   Substance and Sexual Activity  Alcohol Use Yes  . Alcohol/week: 16.0 standard drinks  . Types: 16 Standard drinks or equivalent per week   Comment: 1/2 gallon liquor daily     Social History   Substance and Sexual Activity  Drug Use No    Social History   Socioeconomic History  . Marital status: Single    Spouse name: Not on file  . Number of children: Not on file  . Years of  education: Not on file  . Highest education level: Not on file  Occupational History  . Not on file  Social Needs  . Financial resource strain: Not on file  . Food insecurity:    Worry: Not on file    Inability: Not on file  . Transportation needs:    Medical: Not on file    Non-medical: Not on file  Tobacco Use  . Smoking status: Current Every Day Smoker    Packs/day: 1.00    Types: Cigarettes  . Smokeless tobacco: Never Used  Substance and Sexual Activity  . Alcohol use: Yes    Alcohol/week: 16.0 standard drinks    Types: 16 Standard drinks or equivalent per week    Comment: 1/2 gallon liquor daily  . Drug use: No  . Sexual activity: Not Currently    Birth control/protection: None  Lifestyle  . Physical activity:    Days per week: Not on file    Minutes per session: Not on file  . Stress: Not on file  Relationships  . Social connections:    Talks on phone: Not on file    Gets together: Not on file    Attends religious service: Not on file    Active member of club or organization: Not on file    Attends meetings of clubs or organizations: Not on file    Relationship status: Not on file  Other Topics Concern  . Not on file  Social History Narrative  . Not on file   Additional Social History:    Pain Medications: See MAR Prescriptions: See MAR Over the Counter: See MAR History of alcohol / drug use?: Yes Longest period of sobriety (when/how long): 19.5 months Negative Consequences of Use: Financial, Personal relationships Name of Substance 1: alcohol 1 - Age of First Use: UTA 1 - Amount (size/oz): case and a half of beer 1 - Frequency: daily 1 - Duration: UTA 1 - Last Use / Amount: last drank last pm                  Sleep: Fair  Appetite:  Fair  Current Medications: Current Facility-Administered Medications  Medication Dose Route Frequency Provider Last Rate Last Dose  . acamprosate (CAMPRAL) tablet 666 mg  666 mg Oral TID WC Johnn Hai, MD    666 mg at 10/22/18 0615  . antiseptic oral rinse (BIOTENE) solution 15 mL  15 mL Mouth Rinse PRN Johnn Hai, MD   15 mL at 10/21/18 2106  . atorvastatin (LIPITOR) tablet 10 mg  10 mg Oral q1800 Johnn Hai, MD      . gabapentin (NEURONTIN) capsule 600 mg  600 mg Oral TID Encarnacion Slates, NP   600  mg at 10/22/18 0731  . hydrocerin (EUCERIN) cream   Topical BID Johnn Hai, MD      . hydrOXYzine (ATARAX/VISTARIL) tablet 25 mg  25 mg Oral Q6H PRN Lindell Spar I, NP      . ibuprofen (ADVIL,MOTRIN) tablet 400 mg  400 mg Oral Q4H PRN Lindell Spar I, NP   400 mg at 10/21/18 2105  . levETIRAcetam (KEPPRA) tablet 500 mg  500 mg Oral BID Johnn Hai, MD      . levothyroxine (SYNTHROID, LEVOTHROID) tablet 100 mcg  100 mcg Oral Q0600 Lindell Spar I, NP   100 mcg at 10/22/18 0615  . loperamide (IMODIUM) capsule 2-4 mg  2-4 mg Oral PRN Nwoko, Agnes I, NP      . nicotine (NICODERM CQ - dosed in mg/24 hours) patch 21 mg  21 mg Transdermal Daily Johnn Hai, MD   21 mg at 10/22/18 0731  . pantoprazole (PROTONIX) EC tablet 40 mg  40 mg Oral Daily Lindell Spar I, NP   40 mg at 10/22/18 0731  . polyvinyl alcohol (LIQUIFILM TEARS) 1.4 % ophthalmic solution 1 drop  1 drop Both Eyes PRN Johnn Hai, MD   1 drop at 10/21/18 2107  . primidone (MYSOLINE) tablet 50 mg  50 mg Oral TID Johnn Hai, MD      . thiamine (VITAMIN B-1) tablet 100 mg  100 mg Oral Daily Lindell Spar I, NP   100 mg at 10/22/18 0731  . traZODone (DESYREL) tablet 100 mg  100 mg Oral QHS,MR X 1 Patriciaann Clan E, PA-C   100 mg at 10/22/18 0040  . ziprasidone (GEODON) capsule 40 mg  40 mg Oral BID WC Johnn Hai, MD        Lab Results: No results found for this or any previous visit (from the past 48 hour(s)).  Blood Alcohol level:  Lab Results  Component Value Date   ETH <10 07/18/2017   ETH <10 16/06/9603    Metabolic Disorder Labs: Lab Results  Component Value Date   HGBA1C 4.9 08/28/2018   MPG 93.93 08/28/2018   MPG 88.19  11/06/2017   No results found for: PROLACTIN Lab Results  Component Value Date   CHOL 129 08/28/2018   TRIG 62 08/28/2018   HDL 61 08/28/2018   CHOLHDL 2.1 08/28/2018   VLDL 12 08/28/2018   LDLCALC 56 08/28/2018   LDLCALC 249 (H) 11/06/2017    Physical Findings: AIMS: Facial and Oral Movements Muscles of Facial Expression: None, normal Lips and Perioral Area: None, normal Jaw: None, normal Tongue: None, normal,Extremity Movements Upper (arms, wrists, hands, fingers): None, normal Lower (legs, knees, ankles, toes): None, normal, Trunk Movements Neck, shoulders, hips: None, normal, Overall Severity Severity of abnormal movements (highest score from questions above): None, normal Incapacitation due to abnormal movements: None, normal Patient's awareness of abnormal movements (rate only patient's report): No Awareness, Dental Status Current problems with teeth and/or dentures?: No Does patient usually wear dentures?: No  CIWA:  CIWA-Ar Total: 5 COWS:     Musculoskeletal: Strength & Muscle Tone: within normal limits Gait & Station: normal Patient leans: N/A  Psychiatric Specialty Exam: Physical Exam  ROS  Blood pressure 111/78, pulse (!) 112, temperature 98.1 F (36.7 C), temperature source Oral, resp. rate 16, height 5\' 4"  (1.626 m), weight 88.5 kg.Body mass index is 33.47 kg/m.  General Appearance: Neat  Eye Contact:  Fair  Speech:  Clear and Coherent  Volume:  Normal  Mood:  Dysphoric  Affect:  Constricted  Thought Process:  Goal Directed  Orientation:  Full (Time, Place, and Person)  Thought Content:  Logical  Suicidal Thoughts:  No  Homicidal Thoughts:  No  Memory:  Immediate;   Fair  Judgement:  Fair  Insight:  Fair  Psychomotor Activity:  Normal  Concentration:  Concentration: Fair  Recall:  [employer] of Knowledge:  Fair  Language:  Fair  Akathisia:  Negative  Handed:  Right  AIMS (if indicated):     Assets:  Resilience Social Support  ADL's:   Intact  Cognition:  WNL  Sleep:  Number of Hours: 4.75     Treatment Plan Summary: Daily contact with patient to assess and evaluate symptoms and progress in treatment, Medication management and Plan We will begin Keppra we will switch her detox regimen continue current precautions add Geodon continue cognitive and rehab based therapy further patient finally requesting something for vaginitis/yeast infection  Amaris Delafuente, MD 10/22/2018, 8:26 AM

## 2018-10-22 NOTE — Progress Notes (Signed)
Recreation Therapy Notes  Date: 2.12.20 Time: 1000 Location: 500 Hall Dayroom  Group Topic: Self-Esteem  Goal Area(s) Addresses:  Patient will successfully identify positive attributes about themselves.  Patient will successfully identify benefit of improved self-esteem.   Behavioral Response: None  Intervention: Worksheet, glitter glue, markers, peel and stick designs  Activity: Crest of Arms.  Patients were given a crest that was divided into four sections.  In each section, patients were to identify something that makes them unique, something they are proud of, important dates, things they have accomplished or just whatever they are proud of about themselves.  Education:  Self-Esteem, Dentist.   Education Outcome: Acknowledges education/In group clarification offered/Needs additional education  Clinical Observations/Feedback: Pt did not attend group because she was meeting with Education officer, museum.  Pt asked to complete activity after group ended.  Pt identified things she likes as cars, flowers, butterflies, things that deal with the ocean, her children and big families.    Victorino Sparrow, LRT/CTRS       Victorino Sparrow A 10/22/2018 11:42 AM

## 2018-10-22 NOTE — BHH Suicide Risk Assessment (Signed)
[place] INPATIENT:  Family/Significant Other Suicide Prevention Education  Suicide Prevention Education:  Contact Attempts: Sandi Mariscal, Mother [phone]) has been identified by the patient as the family member/significant other with whom the patient will be residing, and identified as the person(s) who will aid the patient in the event of a mental health crisis.  With written consent from the patient, two attempts were made to provide suicide prevention education, prior to and/or following the patient's discharge.  We were unsuccessful in providing suicide prevention education.  A suicide education pamphlet was given to the patient to share with family/significant other.  Date and time of first attempt: 11:50 AM/10/22/2018  Lawana Pai, MSW Intern [place] Department 10/22/2018, 11:53 AM
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
## 2018-10-23 MED ORDER — PROPRANOLOL HCL 10 MG PO TABS: 10.0000 mg | ORAL_TABLET | Freq: Two times a day (BID) | ORAL | Status: DC

## 2018-10-23 MED ORDER — ZIPRASIDONE HCL 40 MG PO CAPS
40.0000 mg | ORAL_CAPSULE | Freq: Every day | ORAL | Status: DC
  Administered 2018-10-23: 40 mg via ORAL
  Filled 2018-10-23 (×3): qty 1

## 2018-10-23 MED ORDER — KETOROLAC TROMETHAMINE 60 MG/2ML IM SOLN
60.0000 mg | Freq: Once | INTRAMUSCULAR | Status: AC
  Administered 2018-10-23: 60 mg via INTRAMUSCULAR
  Filled 2018-10-23 (×2): qty 2

## 2018-10-23 MED ORDER — PROPRANOLOL HCL 10 MG PO TABS
10.0000 mg | ORAL_TABLET | Freq: Every day | ORAL | Status: DC
  Administered 2018-10-23 – 2018-10-27 (×5): 10 mg via ORAL
  Filled 2018-10-23 (×7): qty 1

## 2018-10-23 MED ORDER — MAGNESIUM HYDROXIDE 400 MG/5ML PO SUSP
30.0000 mL | Freq: Every day | ORAL | Status: DC | PRN
  Administered 2018-10-23: 30 mL via ORAL
  Filled 2018-10-23: qty 30

## 2018-10-23 MED ORDER — HALOPERIDOL 5 MG PO TABS
5.0000 mg | ORAL_TABLET | ORAL | Status: AC
  Administered 2018-10-23: 5 mg via ORAL
  Filled 2018-10-23 (×2): qty 1
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
## 2018-10-23 NOTE — Progress Notes (Signed)
[facility] MD Progress Note  10/23/2018 10:42 AM Dawn Foley  MRN:  [id number] Subjective:    Patient seen before rounds and then again later at her request to discuss other medications.  She believes the medications are helpful she is very much insistent that she needs a beta-blocker for tachycardia however her blood pressure is low, I told her we could probably add a low-dose beta-blocker, she is only requesting 10 mg of Inderal, however we would have to stop the Mysoline but that is doing very well for her detox. She is also reporting that she has an increased appetite this may be her combination of anticonvulsants at any rate she is alert and oriented and cooperative affect is brighter she has no psychosis. After the interview when I encouraged her and stated she made great progress she stated "if I were home I might shoot myself" but she can contract here-this probably reflects Axis II pathology  Principal Problem: Very recurrent depression/BPD Diagnosis: Active Problems:   MDD (major depressive disorder), severe ([place])  Total Time spent with patient: 20 minutes  Past Psychiatric History: Sensitive  Past Medical History:  Past Medical History:  Diagnosis Date  . Alcoholism ([place])   . Anemia   . Anxiety   . Arthritis   . Asthma    as a child  . Bipolar disorder ([place])   . Cancer ([place])    colon  . Chronic kidney disease   . Complication of anesthesia    woke up during colonoscopy  . Depression   . Elevated liver enzymes   . GERD (gastroesophageal reflux disease)   . H/O alcohol abuse    clean for 1 month as of 07/17/17  . Heart murmur    per [facility] per PT  . Hepatitis    denies  . Hepatitis C   . Hypothyroidism   . Mallory-Weiss tear   . Nicotine dependence   . Schizophrenia ([place])   . Seizures ([place])    seizures - most recent 05/2017, supposed to be on Tegretol but can't afford  . Thrombocytopenia ([facility])    Overview:  alcoholism  . Tibial plateau  fracture, right   . Upper GI bleed     Past Surgical History:  Procedure Laterality Date  . APPENDECTOMY    . DILATION AND CURETTAGE OF UTERUS    . ESOPHAGOGASTRODUODENOSCOPY (EGD) WITH PROPOFOL N/A 09/04/2016   Procedure: ESOPHAGOGASTRODUODENOSCOPY (EGD) WITH PROPOFOL;  Surgeon: Doran Stabler, MD;  Location: [place];  Service: Endoscopy;  Laterality: N/A;  . HARDWARE REMOVAL Right 06/18/2017   Procedure: REMOVAL RIGHT EXTERNAL FIXATOR;  Surgeon: Altamese Talmo, MD;  Location: [place];  Service: Orthopedics;  Laterality: Right;  . HERNIA REPAIR     umbilical hernia  . KNEE CLOSED REDUCTION Right 06/18/2017   Procedure: CLOSED MANIPULATION UNDER ANESTHESIA RIGHT KNEE;  Surgeon: Altamese Ely, MD;  Location: [place];  Service: Orthopedics;  Laterality: Right;  . ORIF FEMUR FRACTURE Right 07/18/2017   Procedure: REPAIR NONUNION WITH RIA;  Surgeon: Altamese [place]ville, MD;  Location: [place];  Service: Orthopedics;  Laterality: Right;  . ORIF TIBIA PLATEAU     Family History:  Family History  Problem Relation Age of Onset  . Mental illness Other   . Thyroid disease Other   . Alcoholism Brother     Social History:  Social History   Substance and Sexual Activity  Alcohol Use Yes  . Alcohol/week: 16.0 standard drinks  . Types: 16 Standard drinks  or equivalent per week   Comment: 1/2 gallon liquor daily     Social History   Substance and Sexual Activity  Drug Use No    Social History   Socioeconomic History  . Marital status: Single    Spouse name: Not on file  . Number of children: Not on file  . Years of education: Not on file  . Highest education level: Not on file  Occupational History  . Not on file  Social Needs  . Financial resource strain: Not on file  . Food insecurity:    Worry: Not on file    Inability: Not on file  . Transportation needs:    Medical: Not on file    Non-medical: Not on file  Tobacco Use  . Smoking status: Current Every Day Smoker     Packs/day: 1.00    Types: Cigarettes  . Smokeless tobacco: Never Used  Substance and Sexual Activity  . Alcohol use: Yes    Alcohol/week: 16.0 standard drinks    Types: 16 Standard drinks or equivalent per week    Comment: 1/2 gallon liquor daily  . Drug use: No  . Sexual activity: Not Currently    Birth control/protection: None  Lifestyle  . Physical activity:    Days per week: Not on file    Minutes per session: Not on file  . Stress: Not on file  Relationships  . Social connections:    Talks on phone: Not on file    Gets together: Not on file    Attends religious service: Not on file    Active member of club or organization: Not on file    Attends meetings of clubs or organizations: Not on file    Relationship status: Not on file  Other Topics Concern  . Not on file  Social History Narrative  . Not on file   Additional Social History:    Pain Medications: See MAR Prescriptions: See MAR Over the Counter: See MAR History of alcohol / drug use?: Yes Longest period of sobriety (when/how long): 19.5 months Negative Consequences of Use: Financial, Personal relationships Name of Substance 1: alcohol 1 - Age of First Use: UTA 1 - Amount (size/oz): case and a half of beer 1 - Frequency: daily 1 - Duration: UTA 1 - Last Use / Amount: last drank last pm                  Sleep: Fair  Appetite:  Fair  Current Medications: Current Facility-Administered Medications  Medication Dose Route Frequency Provider Last Rate Last Dose  . acamprosate (CAMPRAL) tablet 666 mg  666 mg Oral TID WC Johnn Hai, MD   666 mg at 10/23/18 0626  . antiseptic oral rinse (BIOTENE) solution 15 mL  15 mL Mouth Rinse PRN Johnn Hai, MD   15 mL at 10/22/18 2100  . atorvastatin (LIPITOR) tablet 10 mg  10 mg Oral q1800 Johnn Hai, MD   10 mg at 10/22/18 1711  . gabapentin (NEURONTIN) capsule 600 mg  600 mg Oral TID Lindell Spar I, NP   600 mg at 10/23/18 0808  . hydrocerin (EUCERIN) cream    Topical BID Johnn Hai, MD      . hydrOXYzine (ATARAX/VISTARIL) tablet 25 mg  25 mg Oral Q6H PRN Lindell Spar I, NP   25 mg at 10/22/18 2100  . ibuprofen (ADVIL,MOTRIN) tablet 400 mg  400 mg Oral Q4H PRN Lindell Spar I, NP   400 mg at 10/22/18 2059  .  ketorolac (TORADOL) injection 60 mg  60 mg Intramuscular Once Johnn Hai, MD      . levETIRAcetam (KEPPRA) tablet 500 mg  500 mg Oral BID Johnn Hai, MD   500 mg at 10/23/18 2595  . levothyroxine (SYNTHROID, LEVOTHROID) tablet 100 mcg  100 mcg Oral Q0600 Lindell Spar I, NP   100 mcg at 10/23/18 0626  . loperamide (IMODIUM) capsule 2-4 mg  2-4 mg Oral PRN Nwoko, Agnes I, NP      . nicotine (NICODERM CQ - dosed in mg/24 hours) patch 21 mg  21 mg Transdermal Daily Johnn Hai, MD   21 mg at 10/23/18 0809  . pantoprazole (PROTONIX) EC tablet 40 mg  40 mg Oral Daily Lindell Spar I, NP   40 mg at 10/23/18 0810  . polyvinyl alcohol (LIQUIFILM TEARS) 1.4 % ophthalmic solution 1 drop  1 drop Both Eyes PRN Johnn Hai, MD   1 drop at 10/22/18 2100  . propranolol (INDERAL) tablet 10 mg  10 mg Oral Q1200 Johnn Hai, MD      . thiamine (VITAMIN B-1) tablet 100 mg  100 mg Oral Daily Lindell Spar I, NP   100 mg at 10/23/18 0809  . traZODone (DESYREL) tablet 100 mg  100 mg Oral QHS,MR X 1 Laverle Hobby, PA-C   100 mg at 10/22/18 2059  . ziprasidone (GEODON) capsule 40 mg  40 mg Oral QPC supper Johnn Hai, MD        Lab Results: No results found for this or any previous visit (from the past 48 hour(s)).  Blood Alcohol level:  Lab Results  Component Value Date   ETH <10 07/18/2017   ETH <10 63/87/5643    Metabolic Disorder Labs: Lab Results  Component Value Date   HGBA1C 4.9 08/28/2018   MPG 93.93 08/28/2018   MPG 88.19 11/06/2017   No results found for: PROLACTIN Lab Results  Component Value Date   CHOL 129 08/28/2018   TRIG 62 08/28/2018   HDL 61 08/28/2018   CHOLHDL 2.1 08/28/2018   VLDL 12 08/28/2018   LDLCALC 56 08/28/2018    LDLCALC 249 (H) 11/06/2017    Physical Findings: AIMS: Facial and Oral Movements Muscles of Facial Expression: None, normal Lips and Perioral Area: None, normal Jaw: None, normal Tongue: None, normal,Extremity Movements Upper (arms, wrists, hands, fingers): None, normal Lower (legs, knees, ankles, toes): None, normal, Trunk Movements Neck, shoulders, hips: None, normal, Overall Severity Severity of abnormal movements (highest score from questions above): None, normal Incapacitation due to abnormal movements: None, normal Patient's awareness of abnormal movements (rate only patient's report): No Awareness, Dental Status Current problems with teeth and/or dentures?: No Does patient usually wear dentures?: No  CIWA:  CIWA-Ar Total: 4 COWS:     Musculoskeletal: Strength & Muscle Tone: within normal limits Gait & Station: normal Patient leans: N/A  Psychiatric Specialty Exam: Physical Exam  ROS  Blood pressure 97/61, pulse (!) 123, temperature 98.2 F (36.8 C), temperature source Oral, resp. rate 18, height 5\' 4"  (1.626 m), weight 88.5 kg.Body mass index is 33.47 kg/m.  General Appearance: Casual  Eye Contact:  Good  Speech:  Normal Rate  Volume:  Normal  Mood:  Dysphoric  Affect:  Congruent  Thought Process:  Goal Directed  Orientation:  Full (Time, Place, and Person)  Thought Content:  Logical  Suicidal Thoughts:  Yes.  without intent/plan  Homicidal Thoughts:  No  Memory:  Immediate;   Good  Judgement:  Good  Insight:  Good  Psychomotor Activity:  Normal  Concentration:  Concentration: Good  Recall:  Good  Fund of Knowledge:  Good  Language:  nl  Akathisia:  Negative  Handed:  Right  AIMS (if indicated):     Assets:  Resilience  ADL's:  Intact  Cognition:  WNL  Sleep:  Number of Hours: 6.75     Treatment Plan Summary: Daily contact with patient to assess and evaluate symptoms and progress in treatment, Medication management and Plan Continue current  precautions and measures no change in meds today other than low-dose beta-blocker and I discussed discontinuation of Mysoline  Dawn Muzio, MD 10/23/2018, 10:42 AM

## 2018-10-23 NOTE — Progress Notes (Signed)
Did not attend group 

## 2018-10-23 NOTE — Progress Notes (Addendum)
1:1 Note: D:  Dawn Foley is currently resting quietly in her room.  Breathing even and unlabored.  She appears to be asleep. A:  Sitter remains by her side. R:  Dawn Foley remains safe on the unit.

## 2018-10-23 NOTE — Progress Notes (Addendum)
Further assessed superficial cut to left wrist.  Cleaned area and placed telfa to arm.  Informed Patriciaann Clan PA about the laceration and no new orders needed.  She was noted sitting cross legged in her bed, eating ice cream/potato chips/cheese sticks and laughing with her roommate.  Sitter remains by her side.

## 2018-10-23 NOTE — Progress Notes (Signed)
Recreation Therapy Notes  Date: 2.13.20 Time: 1000 Location: 500 Hall Dayroom  Group Topic: Anger Management  Goal Area(s) Addresses:  Patient will identify triggers for anger.  Patient will identify a situation that makes them angry.  Patient will identify what other emotions comes with anger.   Behavioral Response: Engaged  Intervention: Worksheet, pencils  Activity:  Introduction to Anger Management.  Patients were to identify three situations/topics or people that lead to anger, what they do when they get angry and what problems they have run into as a result of anger.  Education: Anger Management, Discharge Planning   Education Outcome: Acknowledges education/In group clarification offered/Needs additional education.   Clinical Observations/Feedback: Pt stated she gets angry when people are impatient, her mother and being accused of things.  Pt explained that when angry, she fights, throws things, curses, cries, acts out and destroys things.  Pt also explained that her anger has caused damaged relationships, loss of friends, loss of work, ends up in jail or the hospital and does not compromise.    Victorino Sparrow, LRT/CTRS      Victorino Sparrow A 10/23/2018 11:51 AM

## 2018-10-24 MED ORDER — ZIPRASIDONE HCL 40 MG PO CAPS
40.0000 mg | ORAL_CAPSULE | Freq: Two times a day (BID) | ORAL | Status: DC
  Administered 2018-10-24 – 2018-10-25 (×3): 40 mg via ORAL
  Filled 2018-10-24 (×5): qty 1

## 2018-10-24 MED ORDER — ZOLPIDEM TARTRATE 5 MG PO TABS
10.0000 mg | ORAL_TABLET | Freq: Every day | ORAL | Status: DC
  Administered 2018-10-24 – 2018-10-26 (×3): 10 mg via ORAL
  Filled 2018-10-24 (×3): qty 2

## 2018-10-24 MED ORDER — KETOROLAC TROMETHAMINE 60 MG/2ML IM SOLN
60.0000 mg | Freq: Once | INTRAMUSCULAR | Status: AC
  Administered 2018-10-24: 60 mg via INTRAMUSCULAR
  Filled 2018-10-24: qty 2

## 2018-10-24 MED ORDER — CLINDAMYCIN HCL 300 MG PO CAPS
300.0000 mg | ORAL_CAPSULE | Freq: Two times a day (BID) | ORAL | Status: DC
  Administered 2018-10-24 – 2018-10-27 (×7): 300 mg via ORAL
  Filled 2018-10-24 (×9): qty 1

## 2018-10-24 NOTE — Progress Notes (Signed)
Pt attended spiritual care group facilitated by Chaplain Jerene Pitch and counseling intern Kerry Hough.  Group focused on topic of "hope."  Patients engaged in facilitated discussion connecting hope to experiences in their lives and defining hope in current setting.  Engaged in Arboriculturist art activity - choosing a photo that represented hope for them.  They shared photos with one another as way of defining hope for themselves.    Dawn Foley was present through group.  Alert, engaged with facilitators.   In speaking of hope, she related that she finds hope in going outside and spoke of fishing with her grandfather in [place].  Stated she broken her leg and had not been able to hike as much as she used to.  Spoke of her children as hopeful.   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 the non-functioning kind."  Experienced her family as blaming her and disregarding her.    Related that she felt that hospitalization was helping and was appreciative of her medication.    Engaged in conversation with other group members.     Jerene Pitch, MDiv, [facility]

## 2018-10-24 NOTE — Progress Notes (Signed)
Nursing post 1;1 note : Pt stated she received some relieved from motrin,but asked for her pm medications early pt. Is very focused on her medications and asked if she could take her Ambien at 5 pm. Explained to pt. That Ambien was a sleep medication and couldn't be given this soon. Pt stated she would go right to sleep if I would give it to her.When Asked pt why she cut her self with the razor last night." The voices told me to kill myself their always there".Dawn Foley continues to negotiate for more medication

## 2018-10-24 NOTE — Progress Notes (Signed)
1:1 DAR Note: D:  Dawn Foley is currently resting with her eyes closed and appears to be asleep. A:  1:1 remains for safety by her side. R:  She remains safe on the unit.

## 2018-10-24 NOTE — Progress Notes (Signed)
Recreation Therapy Notes  Date: 2.14.19 Time: 1000 Location: 500 Hall Dayroom   Group Topic: Communication, Team Building, Problem Solving  Goal Area(s) Addresses:  Patient will effectively work with peer towards shared goal.  Patient will identify skill used to make activity successful.  Patient will identify how skills used during activity can be used to reach post d/c goals.   Behavioral Response: Engaged  Intervention: STEM Activity   Activity: [employer]. Patients were provided the following materials: 5 drinking straws, 5 rubber bands, 5 paper clips, 2 index cards and 2 drinking cups. Using the provided materials patients were asked to build a launching mechanisms to launch a ping pong ball approximately 12 feet. Patients were divided into teams of 3-5.   Education: Education officer, community, Dentist.   Education Outcome: Acknowledges education/In group clarification offered/Needs additional education.   Clinical Observations/Feedback:  Pt was bright and active.  Pt and her partner did a great job working through different ideas until they came up with one that worked for them.  Pt stated they had to use communication, faith and team work to complete the activity.     Victorino Sparrow, LRT/CTRS     Ria Comment, Ahijah Devery A 10/24/2018 10:59 AM

## 2018-10-24 NOTE — Progress Notes (Signed)
Adult Psychoeducational Group Note  Date:  10/24/2018 Time:  9:27 PM  Group Topic/Focus:  Wrap-Up Group:   The focus of this group is to help patients review their daily goal of treatment and discuss progress on daily workbooks.  Participation Level:  Active  Participation Quality:  Appropriate  Affect:  Appropriate  Cognitive:  Appropriate  Insight: Appropriate  Engagement in Group:  Engaged  Modes of Intervention:  Discussion  Additional Comments: The  patient expressed that she rates today a 10.The patient also said that she attended groups.  Nash Shearer 10/24/2018, 9:27 PM

## 2018-10-24 NOTE — Progress Notes (Signed)
Patient discontinued from 1:1.  Patient able to contract for safety.  Patient verbalized she only harmed herself so that she would be able to get medications.  Writer informed patient that she did not need to resort to self injury to get her needs met, just to talk to staff. Patient has been able to maintain safety.

## 2018-10-24 NOTE — Progress Notes (Signed)
Nursing post1;1 Note : Pt  States she continues to hear voices but no longer hears voices telling her to self harm. " When can I get more medication, I need more medication. Give it to me as soon as you can." Pt c/o constipation encouraged to drink plenty of water. Remains safe on the unit interacting with peers. Pt attended grief group

## 2018-10-24 NOTE — [facility] Counselor (Signed)
Pt requested CSW provide lists for sober living houses in [facility] after conversation with her mother. CSW printed [employer] list.  Lawana Pai, MSW Intern [place] [facility] 10/24/2018 2:28 PM

## 2018-10-24 NOTE — Progress Notes (Signed)
Patient able to maintain safety without 1:1 intervention.  Patient able to verbalize feelings to staff without resulting to self injury.  Patient has been able to maintain safety.

## 2018-10-24 NOTE — Progress Notes (Signed)
Nursing Post 1:1 note : Pt showered , dsg on left wrist, no drainage noted . Pt in dayroom talking with peers.

## 2018-10-24 NOTE — Progress Notes (Signed)
[facility] MD Progress Note  10/24/2018 9:49 AM Dawn Foley  MRN:  [id number] Subjective:    Patient made superficial cut to her left wrist, she acknowledges she was drug-seeking, continues to be very intrusive and very focused on obtaining more prescription medications, wants her Geodon escalated, wants a Toradol shot, wants Ambien for sleep, states that she does not "feel right" at any rate her blood pressure is still low so we have to be cautious with meds and she is still insisting on a beta-blocker.  Again very primitive in her coping skills either a tantrum in order to get meds or self-harm in order to get meds. Currently denies wanting to harm self states she does not need one-to-one precautions or unit restriction however after her last gesture of self-harm we certainly need to keep her on the current precautions for now.   Principal Problem: Repeated self-harm in the context of borderline personality disorder Diagnosis: Active Problems:   MDD (major depressive disorder), severe (HCC)  Total Time spent with patient: 30 minutes  Past Medical History:  Past Medical History:  Diagnosis Date  . Alcoholism ([place])   . Anemia   . Anxiety   . Arthritis   . Asthma    as a child  . Bipolar disorder ([place])   . Cancer ([place])    colon  . Chronic kidney disease   . Complication of anesthesia    woke up during colonoscopy  . Depression   . Elevated liver enzymes   . GERD (gastroesophageal reflux disease)   . H/O alcohol abuse    clean for 1 month as of 07/17/17  . Heart murmur    per [facility] per PT  . Hepatitis    denies  . Hepatitis C   . Hypothyroidism   . Mallory-Weiss tear   . Nicotine dependence   . Schizophrenia ([place])   . Seizures ([place])    seizures - most recent 05/2017, supposed to be on Tegretol but can't afford  . Thrombocytopenia (HCC)    Overview:  alcoholism  . Tibial plateau fracture, right   . Upper GI bleed     Past Surgical History:  Procedure  Laterality Date  . APPENDECTOMY    . DILATION AND CURETTAGE OF UTERUS    . ESOPHAGOGASTRODUODENOSCOPY (EGD) WITH PROPOFOL N/A 09/04/2016   Procedure: ESOPHAGOGASTRODUODENOSCOPY (EGD) WITH PROPOFOL;  Surgeon: Doran Stabler, MD;  Location: [place];  Service: Endoscopy;  Laterality: N/A;  . HARDWARE REMOVAL Right 06/18/2017   Procedure: REMOVAL RIGHT EXTERNAL FIXATOR;  Surgeon: Altamese [place], MD;  Location: [place];  Service: Orthopedics;  Laterality: Right;  . HERNIA REPAIR     umbilical hernia  . KNEE CLOSED REDUCTION Right 06/18/2017   Procedure: CLOSED MANIPULATION UNDER ANESTHESIA RIGHT KNEE;  Surgeon: Altamese [place], MD;  Location: [place];  Service: Orthopedics;  Laterality: Right;  . ORIF FEMUR FRACTURE Right 07/18/2017   Procedure: REPAIR NONUNION WITH RIA;  Surgeon: Altamese [place], MD;  Location: [place];  Service: Orthopedics;  Laterality: Right;  . ORIF TIBIA PLATEAU     Family History:  Family History  Problem Relation Age of Onset  . Mental illness Other   . Thyroid disease Other   . Alcoholism Brother     Social History:  Social History   Substance and Sexual Activity  Alcohol Use Yes  . Alcohol/week: 16.0 standard drinks  . Types: 16 Standard drinks or equivalent per week   Comment: 1/2 gallon liquor daily  Social History   Substance and Sexual Activity  Drug Use No    Social History   Socioeconomic History  . Marital status: Single    Spouse name: Not on file  . Number of children: Not on file  . Years of education: Not on file  . Highest education level: Not on file  Occupational History  . Not on file  Social Needs  . Financial resource strain: Not on file  . Food insecurity:    Worry: Not on file    Inability: Not on file  . Transportation needs:    Medical: Not on file    Non-medical: Not on file  Tobacco Use  . Smoking status: Current Every Day Smoker    Packs/day: 1.00    Types: Cigarettes  . Smokeless tobacco: Never Used  Substance  and Sexual Activity  . Alcohol use: Yes    Alcohol/week: 16.0 standard drinks    Types: 16 Standard drinks or equivalent per week    Comment: 1/2 gallon liquor daily  . Drug use: No  . Sexual activity: Not Currently    Birth control/protection: None  Lifestyle  . Physical activity:    Days per week: Not on file    Minutes per session: Not on file  . Stress: Not on file  Relationships  . Social connections:    Talks on phone: Not on file    Gets together: Not on file    Attends religious service: Not on file    Active member of club or organization: Not on file    Attends meetings of clubs or organizations: Not on file    Relationship status: Not on file  Other Topics Concern  . Not on file  Social History Narrative  . Not on file   Additional Social History:    Pain Medications: See MAR Prescriptions: See MAR Over the Counter: See MAR History of alcohol / drug use?: Yes Longest period of sobriety (when/how long): 19.5 months Negative Consequences of Use: Financial, Personal relationships Name of Substance 1: alcohol 1 - Age of First Use: UTA 1 - Amount (size/oz): case and a half of beer 1 - Frequency: daily 1 - Duration: UTA 1 - Last Use / Amount: last drank last pm                  Sleep: Fair  Appetite:  Fair  Current Medications: Current Facility-Administered Medications  Medication Dose Route Frequency Provider Last Rate Last Dose  . acamprosate (CAMPRAL) tablet 666 mg  666 mg Oral TID WC Johnn Hai, MD   666 mg at 10/24/18 0640  . antiseptic oral rinse (BIOTENE) solution 15 mL  15 mL Mouth Rinse PRN Johnn Hai, MD   15 mL at 10/22/18 2100  . atorvastatin (LIPITOR) tablet 10 mg  10 mg Oral q1800 Johnn Hai, MD   10 mg at 10/23/18 1707  . gabapentin (NEURONTIN) capsule 600 mg  600 mg Oral TID Lindell Spar I, NP   600 mg at 10/24/18 0745  . hydrocerin (EUCERIN) cream   Topical BID Johnn Hai, MD      . hydrOXYzine (ATARAX/VISTARIL) tablet 25 mg   25 mg Oral Q6H PRN Lindell Spar I, NP   25 mg at 10/23/18 1443  . ibuprofen (ADVIL,MOTRIN) tablet 400 mg  400 mg Oral Q4H PRN Lindell Spar I, NP   400 mg at 10/22/18 2059  . levETIRAcetam (KEPPRA) tablet 500 mg  500 mg Oral BID Johnn Hai, MD  500 mg at 10/24/18 0746  . levothyroxine (SYNTHROID, LEVOTHROID) tablet 100 mcg  100 mcg Oral Q0600 Lindell Spar I, NP   100 mcg at 10/24/18 4496  . loperamide (IMODIUM) capsule 2-4 mg  2-4 mg Oral PRN Nwoko, Agnes I, NP      . magnesium hydroxide (MILK OF MAGNESIA) suspension 30 mL  30 mL Oral Daily PRN Johnn Hai, MD   30 mL at 10/23/18 1420  . nicotine (NICODERM CQ - dosed in mg/24 hours) patch 21 mg  21 mg Transdermal Daily Johnn Hai, MD   21 mg at 10/24/18 0745  . pantoprazole (PROTONIX) EC tablet 40 mg  40 mg Oral Daily Lindell Spar I, NP   40 mg at 10/24/18 0746  . polyvinyl alcohol (LIQUIFILM TEARS) 1.4 % ophthalmic solution 1 drop  1 drop Both Eyes PRN Johnn Hai, MD   1 drop at 10/22/18 2100  . propranolol (INDERAL) tablet 10 mg  10 mg Oral Q1200 Johnn Hai, MD   10 mg at 10/23/18 1150  . thiamine (VITAMIN B-1) tablet 100 mg  100 mg Oral Daily Lindell Spar I, NP   100 mg at 10/24/18 0745  . traZODone (DESYREL) tablet 100 mg  100 mg Oral QHS,MR X 1 Laverle Hobby, PA-C   100 mg at 10/23/18 2001  . ziprasidone (GEODON) capsule 40 mg  40 mg Oral BID WC Johnn Hai, MD   40 mg at 10/24/18 7591    Lab Results: No results found for this or any previous visit (from the past 40 hour(s)).  Blood Alcohol level:  Lab Results  Component Value Date   ETH <10 07/18/2017   ETH <10 63/84/6659    Metabolic Disorder Labs: Lab Results  Component Value Date   HGBA1C 4.9 08/28/2018   MPG 93.93 08/28/2018   MPG 88.19 11/06/2017   No results found for: PROLACTIN Lab Results  Component Value Date   CHOL 129 08/28/2018   TRIG 62 08/28/2018   HDL 61 08/28/2018   CHOLHDL 2.1 08/28/2018   VLDL 12 08/28/2018   LDLCALC 56 08/28/2018   LDLCALC  249 (H) 11/06/2017    Physical Findings: AIMS: Facial and Oral Movements Muscles of Facial Expression: None, normal Lips and Perioral Area: None, normal Jaw: None, normal Tongue: None, normal,Extremity Movements Upper (arms, wrists, hands, fingers): None, normal Lower (legs, knees, ankles, toes): None, normal, Trunk Movements Neck, shoulders, hips: None, normal, Overall Severity Severity of abnormal movements (highest score from questions above): None, normal Incapacitation due to abnormal movements: None, normal Patient's awareness of abnormal movements (rate only patient's report): No Awareness, Dental Status Current problems with teeth and/or dentures?: No Does patient usually wear dentures?: No  CIWA:  CIWA-Ar Total: 0 COWS:     Musculoskeletal: Strength & Muscle Tone: within normal limits Gait & Station: normal Patient leans: N/A  Psychiatric Specialty Exam: Physical Exam  ROS  Blood pressure (!) 83/49, pulse 85, temperature 98.2 F (36.8 C), temperature source Oral, resp. rate 18, height 5\' 4"  (1.626 m), weight 88.5 kg.Body mass index is 33.47 kg/m.  General Appearance: Casual  Eye Contact:  Minimal  Speech:  Pressured  Volume:  Increased  Mood:  Anxious, Depressed and Irritable  Affect:  Congruent  Thought Process:  Goal Directed  Orientation:  Full (Time, Place, and Person)  Thought Content:  Rumination  Suicidal Thoughts:  Yes.  without intent/plan  Homicidal Thoughts:  No  Memory:  Immediate;   Fair  Judgement:  Impaired  Insight:  Shallow  Psychomotor Activity:  Normal  Concentration:  Concentration: Fair  Recall:  [employer] of Knowledge:  Fair  Language:  Fair  Akathisia:  Negative  Handed:  Right  AIMS (if indicated):     Assets:  Physical Health  ADL's:  Intact  Cognition:  WNL  Sleep:  Number of Hours: 6     Treatment Plan Summary: Daily contact with patient to assess and evaluate symptoms and progress in treatment, Medication management  and Plan Continue to cautiously use the following meds Geodon and a beta-blocker, will also continue cognitive and rehab based therapies, continue to monitor for dangerousness on current precautions continue unit restriction  Johnn Hai, MD 10/24/2018, 9:49 AM

## 2018-10-24 NOTE — Progress Notes (Signed)
D:  Dawn Foley remains resting quietly in her bed.  Breathing even and unlabored.  She appears to be asleep. A:  1:1 sitter remains for safety. R:  Inga remains safe on the unit.

## 2018-10-25 MED ORDER — ZIPRASIDONE HCL 60 MG PO CAPS
60.0000 mg | ORAL_CAPSULE | Freq: Every day | ORAL | Status: DC
  Administered 2018-10-26: 60 mg via ORAL
  Filled 2018-10-25 (×2): qty 1

## 2018-10-25 MED ORDER — MAGNESIUM CITRATE PO SOLN
1.0000 | Freq: Once | ORAL | Status: AC
  Administered 2018-10-25: 1 via ORAL
  Filled 2018-10-25: qty 296

## 2018-10-25 MED ORDER — IBUPROFEN 600 MG PO TABS
600.0000 mg | ORAL_TABLET | ORAL | Status: DC | PRN
  Administered 2018-10-25 – 2018-10-26 (×3): 600 mg via ORAL
  Filled 2018-10-25 (×3): qty 1

## 2018-10-25 MED ORDER — ZIPRASIDONE HCL 80 MG PO CAPS
80.0000 mg | ORAL_CAPSULE | Freq: Every day | ORAL | Status: DC
  Administered 2018-10-25: 80 mg via ORAL
  Filled 2018-10-25 (×2): qty 1

## 2018-10-25 NOTE — Plan of Care (Signed)
  Problem: Activity: Goal: Interest or engagement in activities will improve Outcome: Progressing Goal: Sleeping patterns will improve Outcome: Progressing   

## 2018-10-25 NOTE — B[facility] Group Notes (Signed)
LCSW Group Therapy Note  10/25/2018   11:00-12:00  Type of Therapy and Topic:  Group Therapy: Shame and Its Impact   Participation Level:  Active   Description of Group:   In this group, patients shared and discussed that guilt is the negative feeling we have when we've done something wrong, while shame is the negative feeling we have simply about "being."  In listening to each other share, patients learned that humans are all imperfect and that there is no shame in this.  We discussed how it could positively impact our wellbeing by accepting our faults as part of our being that can be worked on but does not have to shame [place].    Therapeutic Goals: 1. Patients will learn the difference between guilt and shame. 2. Patients will share their current shame feelings and how this has impacted their current lives. 3. Patients will explore possible ways to think differently about those parts of their bodies, feelings, and lives about which they do have shame. 4. Patients will learn that shame is universal, and that keeping our shame a secret actually increases its hold on [place].  Summary of Patient Progress:  The patient shared that she feels shame about choosing to party over spending time with her children.  This is of concern because she feels she is not being the mother they need, and she knows they need guidance and education from her.  Therapeutic Modalities:   Cognitive Behavioral Therapy Motivation Interviewing  Maretta Los  .

## 2018-10-25 NOTE — Progress Notes (Signed)
The patient brought up in group that she was unhappy about the staff telling her visitor to leave this evening. The patient feels that their was nothing wrong with her visitor and that he should have been permitted to visit. She states that she has two boyfriends and has a female friend as well. Her goal for tomorrow is to get discharged.

## 2018-10-25 NOTE — Plan of Care (Signed)
D: Patient presents irritable, demanding, intrusive. She has frequent somatic complaints and requests medication to meet these needs. She complains that staff don't listen to her or accommodate her needs, despite helping her often. She c/o of back pain 10/10. Administered ibuprofen to help. She slept well last night and received medication that was helpful. Her appetite is good, energy normal and concentration good. She rates her depression, hopelessness and anxiety 0/10. She denies withdrawal symptoms. Patient denies SI/HI/AVH.  A: Patient checked q15 min, and checks reviewed. Reviewed medication changes with patient and educated on side effects. Educated patient on importance of attending group therapy sessions and educated on several coping skills. Encouarged participation in milieu through recreation therapy and attending meals with peers. Support and encouragement provided. Fluids offered. R: Patient receptive to education on medications, and is medication compliant. Patient contracts for safety on the unit. Goal: "To go home" and "speak with the doctor."  Problem: Education: Goal: Emotional status will improve Outcome: Not Progressing Goal: Mental status will improve Outcome: Not Progressing   Problem: Activity: Goal: Interest or engagement in activities will improve Outcome: Not Progressing Goal: Sleeping patterns will improve Outcome: Not Progressing

## 2018-10-25 NOTE — Progress Notes (Signed)
The patient was observed talking almost non-stop and was overheard discussing inappropriate topics, ie. Jail, drugs, relationships, sex. This author approached the patient in the dayroom regarding her constant cursing and loud behavior. She apologized and stated that she would try to adjust her language. This Pryor Curia attempted to redirect her for inappropriate conversations but she felt that their was no issue. Before other issues could be addressed, the patient stated that she "hated" one of the nurses. When asked why she disliked the staff, she pointed out that this particular put her on unit restriction during her previous admission for "trying to run out". This Pryor Curia explained that we have a responsibility to keep everyone safe but she felt that she should have been permitted to leave. She was reminded that the staff had only tried to follow hospital guidelines and that this was the case for all of our patients. This Pryor Curia attempted to see if she had learned anything from her previous admission or from this author's conversation, but she said that she could only remember being redirected for cursing.

## 2018-10-25 NOTE — BHH Counselor (Signed)
Clinical Social Work Note  Phone call was received from a gentleman who identified himself as Alfonzo Feller, saying he was reserving a room at a home for pt only until 5pm today and needed to know if she was going to be discharged for it.  CSW explained that she would not be discharged until Monday or Tuesday and asked if the room could be kept until then.  He answered in the negative but said another room may be available later.  Selmer Dominion, LCSW 10/25/2018, 12:43 PM

## 2018-10-25 NOTE — Progress Notes (Signed)
D:  Dawn Foley was up and visible on the unit early this shift.  She was pleasant and cooperative.  No SI/HI or A/V hallucinations.  She was noted smiling and laughing on the unit.  She immediately went to her room after group/snack and fell asleep.  RN attempted to wake her to give hs medications but she stayed asleep.  She later woke up and requested hs medications and took them without difficulty.  She is currently resting with her eyes closed and appears to be asleep. A:  1:1 with RN for support and encouragement.  Medications as ordered.  Q 15 minute checks maintained for safety.  Encouraged participation in group and unit activities.   R:  Dawn Foley remains safe on the unit.  We will continue to monitor the progress towards her goals.

## 2018-10-25 NOTE — Progress Notes (Signed)
[facility] MD Progress Note  10/25/2018 11:16 AM Dawn Foley  MRN:  [id number] Subjective: Patient is seen and examined.  Patient is a 33 year old female with a past psychiatric history significant for alcohol dependency, polysubstance use disorder, borderline personality disorder.  She was admitted on 10/21/2018 after a drug binge and request to get detox.  She was significantly irritated at the time.  She went into a back room and wrap something around her neck, and then she was admitted.  Objective: Patient is seen and examined.  Patient is a 32 year old female with the above-stated past psychiatric history who is seen in follow-up.  She remains significantly agitated and pressured.  She has a list of things that she wants.  She stated that if possible she wanted an increase in her Geodon.  She also requested additional Toradol and other medications.  She is very somatic and points at some varicose veins in her feet and stated she is concerned about possible DVT.  We discussed that.  She is still pressured and agitated.  She stated she wanted to leave today.  She stated she had a place at the [place], but I told her I would check with social work.  I told her at least from my review of the electronic medical record that she would probably not be discharged until early next week.  She denied any suicidal ideation.  Her vital signs are stable, she is afebrile.  She slept 7.5 hours last night.  Principal Problem: <principal problem not specified> Diagnosis: Active Problems:   MDD (major depressive disorder), severe (HCC)  Total Time spent with patient: 20 minutes  Past Psychiatric History: See admission H&P  Past Medical History:  Past Medical History:  Diagnosis Date  . Alcoholism ([place])   . Anemia   . Anxiety   . Arthritis   . Asthma    as a child  . Bipolar disorder ([place])   . Cancer ([place])    colon  . Chronic kidney disease   . Complication of anesthesia    woke up during  colonoscopy  . Depression   . Elevated liver enzymes   . GERD (gastroesophageal reflux disease)   . H/O alcohol abuse    clean for 1 month as of 07/17/17  . Heart murmur    per [facility] per PT  . Hepatitis    denies  . Hepatitis C   . Hypothyroidism   . Mallory-Weiss tear   . Nicotine dependence   . Schizophrenia ([place])   . Seizures ([place])    seizures - most recent 05/2017, supposed to be on Tegretol but can't afford  . Thrombocytopenia (HCC)    Overview:  alcoholism  . Tibial plateau fracture, right   . Upper GI bleed     Past Surgical History:  Procedure Laterality Date  . APPENDECTOMY    . DILATION AND CURETTAGE OF UTERUS    . ESOPHAGOGASTRODUODENOSCOPY (EGD) WITH PROPOFOL N/A 09/04/2016   Procedure: ESOPHAGOGASTRODUODENOSCOPY (EGD) WITH PROPOFOL;  Surgeon: Doran Stabler, MD;  Location: [place];  Service: Endoscopy;  Laterality: N/A;  . HARDWARE REMOVAL Right 06/18/2017   Procedure: REMOVAL RIGHT EXTERNAL FIXATOR;  Surgeon: Altamese Baxter, MD;  Location: [place];  Service: Orthopedics;  Laterality: Right;  . HERNIA REPAIR     umbilical hernia  . KNEE CLOSED REDUCTION Right 06/18/2017   Procedure: CLOSED MANIPULATION UNDER ANESTHESIA RIGHT KNEE;  Surgeon: Altamese Hubbell, MD;  Location: [place];  Service: Orthopedics;  Laterality:  Right;  Marland Kitchen ORIF FEMUR FRACTURE Right 07/18/2017   Procedure: REPAIR NONUNION WITH RIA;  Surgeon: Altamese [place], MD;  Location: [place];  Service: Orthopedics;  Laterality: Right;  . ORIF TIBIA PLATEAU     Family History:  Family History  Problem Relation Age of Onset  . Mental illness Other   . Thyroid disease Other   . Alcoholism Brother    Family Psychiatric  History: See admission H&P Social History:  Social History   Substance and Sexual Activity  Alcohol Use Yes  . Alcohol/week: 16.0 standard drinks  . Types: 16 Standard drinks or equivalent per week   Comment: 1/2 gallon liquor daily     Social History   Substance and  Sexual Activity  Drug Use No    Social History   Socioeconomic History  . Marital status: Single    Spouse name: Not on file  . Number of children: Not on file  . Years of education: Not on file  . Highest education level: Not on file  Occupational History  . Not on file  Social Needs  . Financial resource strain: Not on file  . Food insecurity:    Worry: Not on file    Inability: Not on file  . Transportation needs:    Medical: Not on file    Non-medical: Not on file  Tobacco Use  . Smoking status: Current Every Day Smoker    Packs/day: 1.00    Types: Cigarettes  . Smokeless tobacco: Never Used  Substance and Sexual Activity  . Alcohol use: Yes    Alcohol/week: 16.0 standard drinks    Types: 16 Standard drinks or equivalent per week    Comment: 1/2 gallon liquor daily  . Drug use: No  . Sexual activity: Not Currently    Birth control/protection: None  Lifestyle  . Physical activity:    Days per week: Not on file    Minutes per session: Not on file  . Stress: Not on file  Relationships  . Social connections:    Talks on phone: Not on file    Gets together: Not on file    Attends religious service: Not on file    Active member of club or organization: Not on file    Attends meetings of clubs or organizations: Not on file    Relationship status: Not on file  Other Topics Concern  . Not on file  Social History Narrative  . Not on file   Additional Social History:    Pain Medications: See MAR Prescriptions: See MAR Over the Counter: See MAR History of alcohol / drug use?: Yes Longest period of sobriety (when/how long): 19.5 months Negative Consequences of Use: Financial, Personal relationships Name of Substance 1: alcohol 1 - Age of First Use: UTA 1 - Amount (size/oz): case and a half of beer 1 - Frequency: daily 1 - Duration: UTA 1 - Last Use / Amount: last drank last pm                  Sleep: Good  Appetite:  Good  Current  Medications: Current Facility-Administered Medications  Medication Dose Route Frequency Provider Last Rate Last Dose  . acamprosate (CAMPRAL) tablet 666 mg  666 mg Oral TID WC Johnn Hai, MD   666 mg at 10/25/18 6761  . antiseptic oral rinse (BIOTENE) solution 15 mL  15 mL Mouth Rinse PRN Johnn Hai, MD   15 mL at 10/25/18 0800  . atorvastatin (LIPITOR) tablet 10  mg  10 mg Oral q1800 Johnn Hai, MD   10 mg at 10/24/18 1659  . clindamycin (CLEOCIN) capsule 300 mg  300 mg Oral BID Johnn Hai, MD   300 mg at 10/25/18 0759  . gabapentin (NEURONTIN) capsule 600 mg  600 mg Oral TID Lindell Spar I, NP   600 mg at 10/25/18 0800  . hydrocerin (EUCERIN) cream   Topical BID Johnn Hai, MD      . ibuprofen (ADVIL,MOTRIN) tablet 600 mg  600 mg Oral Q4H PRN Sharma Covert, MD      . levETIRAcetam (KEPPRA) tablet 500 mg  500 mg Oral BID Johnn Hai, MD   500 mg at 10/25/18 0759  . levothyroxine (SYNTHROID, LEVOTHROID) tablet 100 mcg  100 mcg Oral Q0600 Lindell Spar I, NP   100 mcg at 10/25/18 [phone]  . magnesium hydroxide (MILK OF MAGNESIA) suspension 30 mL  30 mL Oral Daily PRN Johnn Hai, MD   30 mL at 10/23/18 1420  . nicotine (NICODERM CQ - dosed in mg/24 hours) patch 21 mg  21 mg Transdermal Daily Johnn Hai, MD   21 mg at 10/25/18 0800  . pantoprazole (PROTONIX) EC tablet 40 mg  40 mg Oral Daily Lindell Spar I, NP   40 mg at 10/25/18 0759  . polyvinyl alcohol (LIQUIFILM TEARS) 1.4 % ophthalmic solution 1 drop  1 drop Both Eyes PRN Johnn Hai, MD   1 drop at 10/25/18 0800  . propranolol (INDERAL) tablet 10 mg  10 mg Oral Q1200 Johnn Hai, MD   10 mg at 10/24/18 1205  . thiamine (VITAMIN B-1) tablet 100 mg  100 mg Oral Daily Lindell Spar I, NP   100 mg at 10/25/18 0759  . [START ON 10/26/2018] ziprasidone (GEODON) capsule 60 mg  60 mg Oral Q breakfast Sharma Covert, MD      . ziprasidone (GEODON) capsule 80 mg  80 mg Oral Q supper Sharma Covert, MD      . zolpidem Lorrin Mais)  tablet 10 mg  10 mg Oral QHS Johnn Hai, MD   10 mg at 10/24/18 2353    Lab Results: No results found for this or any previous visit (from the past 68 hour(s)).  Blood Alcohol level:  Lab Results  Component Value Date   ETH <10 07/18/2017   ETH <10 96/12/5407    Metabolic Disorder Labs: Lab Results  Component Value Date   HGBA1C 4.9 08/28/2018   MPG 93.93 08/28/2018   MPG 88.19 11/06/2017   No results found for: PROLACTIN Lab Results  Component Value Date   CHOL 129 08/28/2018   TRIG 62 08/28/2018   HDL 61 08/28/2018   CHOLHDL 2.1 08/28/2018   VLDL 12 08/28/2018   LDLCALC 56 08/28/2018   LDLCALC 249 (H) 11/06/2017    Physical Findings: AIMS: Facial and Oral Movements Muscles of Facial Expression: None, normal Lips and Perioral Area: None, normal Jaw: None, normal Tongue: None, normal,Extremity Movements Upper (arms, wrists, hands, fingers): None, normal Lower (legs, knees, ankles, toes): None, normal, Trunk Movements Neck, shoulders, hips: None, normal, Overall Severity Severity of abnormal movements (highest score from questions above): None, normal Incapacitation due to abnormal movements: None, normal Patient's awareness of abnormal movements (rate only patient's report): No Awareness, Dental Status Current problems with teeth and/or dentures?: No Does patient usually wear dentures?: No  CIWA:  CIWA-Ar Total: 0 COWS:  COWS Total Score: 1  Musculoskeletal: Strength & Muscle Tone: within normal limits Gait & Station:  normal Patient leans: N/A  Psychiatric Specialty Exam: Physical Exam  Nursing note and vitals reviewed. Constitutional: She is oriented to person, place, and time. She appears well-developed.  HENT:  Head: Normocephalic and atraumatic.  Respiratory: Effort normal.  Neurological: She is alert and oriented to person, place, and time.    ROS  Blood pressure 110/70, pulse 92, temperature 98.1 F (36.7 C), temperature source Oral, resp. rate  18, height 5\' 4"  (1.626 m), weight 88.5 kg.Body mass index is 33.47 kg/m.  General Appearance: Casual  Eye Contact:  Good  Speech:  Pressured  Volume:  Increased  Mood:  Anxious, Dysphoric and Irritable  Affect:  Labile  Thought Process:  Coherent and Descriptions of Associations: Tangential  Orientation:  Full (Time, Place, and Person)  Thought Content:  Tangential  Suicidal Thoughts:  No  Homicidal Thoughts:  No  Memory:  Immediate;   Fair Recent;   Fair Remote;   Fair  Judgement:  Impaired  Insight:  Fair  Psychomotor Activity:  Increased  Concentration:  Concentration: Fair and Attention Span: Fair  Recall:  [employer] of Knowledge:  Fair  Language:  Fair  Akathisia:  Negative  Handed:  Right  AIMS (if indicated):     Assets:  Desire for Improvement Physical Health Resilience  ADL's:  Intact  Cognition:  WNL  Sleep:  Number of Hours: 7.5     Treatment Plan Summary: Daily contact with patient to assess and evaluate symptoms and progress in treatment, Medication management and Plan : Patient is seen and examined.  Patient is a 33 year old female with the above-stated past psychiatric history who is seen in follow-up.  Patient appears to be significantly agitated and manic today.  She is pressured, tangential.  I will increase her Geodon to 60 mg p.o. daily with food, and 80 mg p.o. nightly with food.  She is very somatic and medication seeking.  I do not feel comfortable continuing the Toradol.  I have increased her ibuprofen to 600 mg p.o. every 6 hours as needed pain.  She is already on Protonix for GI protection.  No other changes to her medication at this point. 1.  Continue gabapentin 600 mg p.o. 3 times daily for chronic pain and mood stability. 2.  Increase ibuprofen to 600 mg p.o. every 6 hours as needed pain. 3.  Continue levothyroxine 100 mcg p.o. daily for hypothyroidism. 4.  I do not see a TSH in the chart, so I will order that for the a.m. 5.  Continue Keppra  500 mg p.o. twice daily for seizure disorder and mood stability. 6.  Continue thiamine 100 mg p.o. daily for nutritional supplementation. 7.  Increase Geodon to 60 mg p.o. daily and 80 mg p.o. nightly for mood stability. 8.  Continue Ambien 10 mg p.o. nightly as needed insomnia. 9.  Disposition planning-patient stated that she has a bed at [place], but I do believe she is to unstable at this point to leave today.  Hopefully they will hold her bed and she will be able to get in there on either Sunday or Monday depending on her improvement.  Sharma Covert, MD 10/25/2018, 11:16 AM

## 2018-10-26 MED ORDER — ZIPRASIDONE HCL 80 MG PO CAPS
80.0000 mg | ORAL_CAPSULE | Freq: Two times a day (BID) | ORAL | Status: DC
  Administered 2018-10-26 – 2018-10-27 (×2): 80 mg via ORAL
  Filled 2018-10-26 (×4): qty 1

## 2018-10-26 NOTE — Progress Notes (Signed)
[facility] MD Progress Note  10/26/2018 12:48 PM Dawn Foley  MRN:  [id number] Subjective:  Patient is seen and examined.  Patient is a 33 year old female with a past psychiatric history significant for alcohol dependency, polysubstance use disorder, borderline personality disorder.  She was admitted on 10/21/2018 after a drug binge and request to get detox.  She was significantly irritated at the time.  She went into a back room and wrap something around her neck, and then she was admitted.  Objective: Patient is seen and examined.  Patient is a 33 year old female with the above-stated past psychiatric history who is seen in follow-up.  Patient is slightly better than yesterday.  She is slightly less intrusive, slightly less pressured.  She still very med seeking.  We did discuss increasing her Geodon to 80 mg twice a day for an equal dose.  She continues to worry about her feet and swelling.  I have looked at her feet notes only mild swelling.  We discussed the potential for TED hose.  She continues to request discharge, but I told her we would not plan on that until tomorrow so that we can get confirmation of a place for her at [place], and that discharge in planning would be on a weekday to confirm everything.  She is tolerant of that answer.  Her vital signs are stable, she is afebrile.  Slept 6.25 hours last night.  She denied any suicidal or homicidal ideation.  Principal Problem: <principal problem not specified> Diagnosis: Active Problems:   MDD (major depressive disorder), severe (HCC)  Total Time spent with patient: 20 minutes  Past Psychiatric History: See admission H&P  Past Medical History:  Past Medical History:  Diagnosis Date  . Alcoholism ([place])   . Anemia   . Anxiety   . Arthritis   . Asthma    as a child  . Bipolar disorder ([place])   . Cancer ([place])    colon  . Chronic kidney disease   . Complication of anesthesia    woke up during colonoscopy  . Depression   .  Elevated liver enzymes   . GERD (gastroesophageal reflux disease)   . H/O alcohol abuse    clean for 1 month as of 07/17/17  . Heart murmur    per [facility] per PT  . Hepatitis    denies  . Hepatitis C   . Hypothyroidism   . Mallory-Weiss tear   . Nicotine dependence   . Schizophrenia ([place])   . Seizures ([place])    seizures - most recent 05/2017, supposed to be on Tegretol but can't afford  . Thrombocytopenia (HCC)    Overview:  alcoholism  . Tibial plateau fracture, right   . Upper GI bleed     Past Surgical History:  Procedure Laterality Date  . APPENDECTOMY    . DILATION AND CURETTAGE OF UTERUS    . ESOPHAGOGASTRODUODENOSCOPY (EGD) WITH PROPOFOL N/A 09/04/2016   Procedure: ESOPHAGOGASTRODUODENOSCOPY (EGD) WITH PROPOFOL;  Surgeon: Doran Stabler, MD;  Location: [place];  Service: Endoscopy;  Laterality: N/A;  . HARDWARE REMOVAL Right 06/18/2017   Procedure: REMOVAL RIGHT EXTERNAL FIXATOR;  Surgeon: Altamese Tobias, MD;  Location: [place];  Service: Orthopedics;  Laterality: Right;  . HERNIA REPAIR     umbilical hernia  . KNEE CLOSED REDUCTION Right 06/18/2017   Procedure: CLOSED MANIPULATION UNDER ANESTHESIA RIGHT KNEE;  Surgeon: Altamese Poole, MD;  Location: [place];  Service: Orthopedics;  Laterality: Right;  . ORIF  FEMUR FRACTURE Right 07/18/2017   Procedure: REPAIR NONUNION WITH RIA;  Surgeon: Altamese Reklaw, MD;  Location: [place];  Service: Orthopedics;  Laterality: Right;  . ORIF TIBIA PLATEAU     Family History:  Family History  Problem Relation Age of Onset  . Mental illness Other   . Thyroid disease Other   . Alcoholism Brother    Family Psychiatric  History: See admission H&P Social History:  Social History   Substance and Sexual Activity  Alcohol Use Yes  . Alcohol/week: 16.0 standard drinks  . Types: 16 Standard drinks or equivalent per week   Comment: 1/2 gallon liquor daily     Social History   Substance and Sexual Activity  Drug Use No     Social History   Socioeconomic History  . Marital status: Single    Spouse name: Not on file  . Number of children: Not on file  . Years of education: Not on file  . Highest education level: Not on file  Occupational History  . Not on file  Social Needs  . Financial resource strain: Not on file  . Food insecurity:    Worry: Not on file    Inability: Not on file  . Transportation needs:    Medical: Not on file    Non-medical: Not on file  Tobacco Use  . Smoking status: Current Every Day Smoker    Packs/day: 1.00    Types: Cigarettes  . Smokeless tobacco: Never Used  Substance and Sexual Activity  . Alcohol use: Yes    Alcohol/week: 16.0 standard drinks    Types: 16 Standard drinks or equivalent per week    Comment: 1/2 gallon liquor daily  . Drug use: No  . Sexual activity: Not Currently    Birth control/protection: None  Lifestyle  . Physical activity:    Days per week: Not on file    Minutes per session: Not on file  . Stress: Not on file  Relationships  . Social connections:    Talks on phone: Not on file    Gets together: Not on file    Attends religious service: Not on file    Active member of club or organization: Not on file    Attends meetings of clubs or organizations: Not on file    Relationship status: Not on file  Other Topics Concern  . Not on file  Social History Narrative  . Not on file   Additional Social History:    Pain Medications: See MAR Prescriptions: See MAR Over the Counter: See MAR History of alcohol / drug use?: Yes Longest period of sobriety (when/how long): 19.5 months Negative Consequences of Use: Financial, Personal relationships Name of Substance 1: alcohol 1 - Age of First Use: UTA 1 - Amount (size/oz): case and a half of beer 1 - Frequency: daily 1 - Duration: UTA 1 - Last Use / Amount: last drank last pm                  Sleep: Good  Appetite:  Good  Current Medications: Current Facility-Administered  Medications  Medication Dose Route Frequency Provider Last Rate Last Dose  . acamprosate (CAMPRAL) tablet 666 mg  666 mg Oral TID WC Johnn Hai, MD   666 mg at 10/26/18 1206  . antiseptic oral rinse (BIOTENE) solution 15 mL  15 mL Mouth Rinse PRN Johnn Hai, MD   15 mL at 10/26/18 0745  . atorvastatin (LIPITOR) tablet 10 mg  10 mg  Oral Q6761 Johnn Hai, MD   10 mg at 10/25/18 1703  . clindamycin (CLEOCIN) capsule 300 mg  300 mg Oral BID Johnn Hai, MD   300 mg at 10/26/18 0745  . gabapentin (NEURONTIN) capsule 600 mg  600 mg Oral TID Lindell Spar I, NP   600 mg at 10/26/18 1207  . hydrocerin (EUCERIN) cream   Topical BID Johnn Hai, MD      . ibuprofen (ADVIL,MOTRIN) tablet 600 mg  600 mg Oral Q4H PRN Sharma Covert, MD   600 mg at 10/26/18 0744  . levETIRAcetam (KEPPRA) tablet 500 mg  500 mg Oral BID Johnn Hai, MD   500 mg at 10/26/18 0745  . levothyroxine (SYNTHROID, LEVOTHROID) tablet 100 mcg  100 mcg Oral Q0600 Lindell Spar I, NP   100 mcg at 10/26/18 0657  . magnesium hydroxide (MILK OF MAGNESIA) suspension 30 mL  30 mL Oral Daily PRN Johnn Hai, MD   30 mL at 10/23/18 1420  . nicotine (NICODERM CQ - dosed in mg/24 hours) patch 21 mg  21 mg Transdermal Daily Johnn Hai, MD   21 mg at 10/26/18 0745  . pantoprazole (PROTONIX) EC tablet 40 mg  40 mg Oral Daily Lindell Spar I, NP   40 mg at 10/26/18 0744  . polyvinyl alcohol (LIQUIFILM TEARS) 1.4 % ophthalmic solution 1 drop  1 drop Both Eyes PRN Johnn Hai, MD   1 drop at 10/26/18 0745  . propranolol (INDERAL) tablet 10 mg  10 mg Oral Q1200 Johnn Hai, MD   10 mg at 10/26/18 1211  . thiamine (VITAMIN B-1) tablet 100 mg  100 mg Oral Daily Lindell Spar I, NP   100 mg at 10/26/18 0745  . ziprasidone (GEODON) capsule 80 mg  80 mg Oral BID WC Sharma Covert, MD      . zolpidem [facility]) tablet 10 mg  10 mg Oral QHS Johnn Hai, MD   10 mg at 10/25/18 2107    Lab Results: No results found for this or any previous visit  (from the past 29 hour(s)).  Blood Alcohol level:  Lab Results  Component Value Date   ETH <10 07/18/2017   ETH <10 95/05/3266    Metabolic Disorder Labs: Lab Results  Component Value Date   HGBA1C 4.9 08/28/2018   MPG 93.93 08/28/2018   MPG 88.19 11/06/2017   No results found for: PROLACTIN Lab Results  Component Value Date   CHOL 129 08/28/2018   TRIG 62 08/28/2018   HDL 61 08/28/2018   CHOLHDL 2.1 08/28/2018   VLDL 12 08/28/2018   LDLCALC 56 08/28/2018   LDLCALC 249 (H) 11/06/2017    Physical Findings: AIMS: Facial and Oral Movements Muscles of Facial Expression: None, normal Lips and Perioral Area: None, normal Jaw: None, normal Tongue: None, normal,Extremity Movements Upper (arms, wrists, hands, fingers): None, normal Lower (legs, knees, ankles, toes): None, normal, Trunk Movements Neck, shoulders, hips: None, normal, Overall Severity Severity of abnormal movements (highest score from questions above): None, normal Incapacitation due to abnormal movements: None, normal Patient's awareness of abnormal movements (rate only patient's report): No Awareness, Dental Status Current problems with teeth and/or dentures?: No Does patient usually wear dentures?: No  CIWA:  CIWA-Ar Total: 0 COWS:  COWS Total Score: 1  Musculoskeletal: Strength & Muscle Tone: within normal limits Gait & Station: normal Patient leans: N/A  Psychiatric Specialty Exam: Physical Exam  Nursing note and vitals reviewed. Constitutional: She is oriented to person, place, and time.  She appears well-developed and well-nourished.  HENT:  Head: Normocephalic and atraumatic.  Respiratory: Effort normal.  Neurological: She is alert and oriented to person, place, and time.    ROS  Blood pressure 118/70, pulse 88, temperature (!) 97.4 F (36.3 C), temperature source Oral, resp. rate 16, height 5\' 4"  (1.626 m), weight 88.5 kg.Body mass index is 33.47 kg/m.  General Appearance: Casual  Eye  Contact:  Fair  Speech:  Normal Rate  Volume:  Increased  Mood:  Dysphoric  Affect:  Congruent  Thought Process:  Coherent and Descriptions of Associations: Circumstantial  Orientation:  Full (Time, Place, and Person)  Thought Content:  Logical  Suicidal Thoughts:  No  Homicidal Thoughts:  No  Memory:  Immediate;   Fair Recent;   Fair Remote;   Fair  Judgement:  Intact  Insight:  Lacking  Psychomotor Activity:  Increased  Concentration:  Concentration: Fair and Attention Span: Fair  Recall:  [employer] of Knowledge:  Fair  Language:  Fair  Akathisia:  Negative  Handed:  Right  AIMS (if indicated):     Assets:  Desire for Improvement Physical Health Resilience  ADL's:  Intact  Cognition:  WNL  Sleep:  Number of Hours: 6.25     Treatment Plan Summary: Daily contact with patient to assess and evaluate symptoms and progress in treatment, Medication management and Plan : Patient is seen and examined.  Patient is a 33 year old female with the above-stated past psychiatric history who is seen in follow-up.  Patient appears to be doing better today.  She is less pressured, less tangential, less intrusive.  We will go on and increase her Geodon to 80 mg twice daily.  No other change in her medications.  I will order some TED hose for her mild foot swelling.  Hopefully she will have a bed at for dialysis tomorrow and we can proceed with that. #1 continue Campral 666 mg p.o. 3 times daily for alcohol cravings. 2.  Continue Lipitor 10 mg p.o. daily for hyperlipidemia. 3.  Continue clindamycin 300 mg p.o. twice daily for infection. 4.  Continue gabapentin 600 mg p.o. 3 times daily for chronic pain issues and mood stability. 5.  Continue Keppra 500 mg p.o. twice daily for mood stability and seizures. 6.  Continue levothyroxine 100 mcg p.o. daily for hypothyroidism. 7.  Continue pantoprazole 40 mg p.o. daily for GERD. 8.  Continue Inderal 10 mg p.o. daily for tachycardia and anxiety. 9.   Increase Geodon to 80 mg p.o. twice daily for mood stability. 10.  Continue Ambien 10 mg p.o. nightly for sleep. 11.  Disposition planning-in progress.  Sharma Covert, MD 10/26/2018, 12:48 PM

## 2018-10-26 NOTE — BHH Group Notes (Signed)
[place] LCSW Group Therapy Note  Date/Time:  10/26/2018  11:00AM-12:00PM  Type of Therapy and Topic:  Group Therapy:  Music and Mood  Participation Level:  Minimal   Description of Group: In this process group, members listened to a variety of genres of music and identified that different types of music evoke different responses.  Patients were encouraged to identify music that was soothing for them and music that was energizing for them.  Patients discussed how this knowledge can help with wellness and recovery in various ways including managing depression and anxiety as well as encouraging healthy sleep habits.    Therapeutic Goals: 1. Patients will explore the impact of different varieties of music on mood 2. Patients will verbalize the thoughts they have when listening to different types of music 3. Patients will identify music that is soothing to them as well as music that is energizing to them 4. Patients will discuss how to use this knowledge to assist in maintaining wellness and recovery 5. Patients will explore the use of music as a coping skill  Summary of Patient Progress:  At the beginning of group, patient expressed that she felt sleepy and anxious, rating this a "10" on a 1-10 scale.  She was in and out of the room numerous times, saying that her medication was making her go to the bathroom a lot.  At the end of group she said she was less sleepy and her anxiety was reduced to about a 5.  Therapeutic Modalities: Solution Focused Brief Therapy Activity   Selmer Dominion, LCSW

## 2018-10-26 NOTE — Progress Notes (Signed)
Adult Psychoeducational Group Note  Date:  10/26/2018 Time:  9:35 PM  Group Topic/Focus:  Wrap-Up Group:   The focus of this group is to help patients review their daily goal of treatment and discuss progress on daily workbooks.  Participation Level:  Active  Participation Quality:  Appropriate  Affect:  Appropriate  Cognitive:  Appropriate  Insight: Appropriate  Engagement in Group:  Engaged  Modes of Intervention:  Discussion  Additional Comments: The patient expressed that she rates today a 7.The patient also said that hope is discharging from [facility] tomorrow.  Nash Shearer 10/26/2018, 9:35 PM

## 2018-10-26 NOTE — Plan of Care (Signed)
D: Patient presents animated, more pleasant today. She is still attention seeking, and requesting frequent PRN medication administration. She has BLE edema +2, and compression stockings were ordered by physician and applied. She reports sleeping well last night, and receiving medication that was helpful. Her appetite is good, energy normal and concentration good. She rates her depression, hopelessness and anxiety 0/10. She denies withdrawal symptoms. She continues to complain of back pain 10/10. She says this never improves. Heat packs and tylenol given. Patient denies SI/HI/AVH. She has made threats a couple times to staff today that if the "doctor doesn't release me then I am gonna punch him." A: Patient checked q15 min, and checks reviewed. Reviewed medication changes with patient and educated on side effects. Educated patient on importance of attending group therapy sessions and educated on several coping skills. Encouarged participation in milieu through recreation therapy and attending meals with peers. Support and encouragement provided. Fluids offered. R: Patient receptive to education on medications, and is medication compliant. Patient contracts for safety on the unit. Goal: "to go home" and "to speak with the doctor."

## 2018-10-26 NOTE — BHH Counselor (Signed)
At patient's request, CSW provided clothing to patient from the clothing closet, including jeans, 2 pairs of underwear, and 2 shirts.  Dawn Foley 10/26/2018 8:51 AM

## 2018-10-26 NOTE — Progress Notes (Signed)
D:  Dawn Foley was up and visible on the unit.  She stated she had a good day and feels like she is ready to leave.  She did take magnesium citrate this evening around 650pm and no results as of this time and she is concerned that it hasn't worked yet.  She denied SI/HI or A/V hallucinations.  She was noted in the day room more this evening interacting with the female peers.  She denied SI/HI or A/V hallucinations.  She is currently resting with her eyes closed and appears to be in no physical distress.   A:  1:1 with RN for support and encouragement.  Medications as ordered.  Q 15 minute checks maintained for safety.  Encouraged participation in group and unit activities.   R:  Shayleen remains safe on the unit.  We will continue to monitor the progress towards her goals.

## 2018-10-27 MED ORDER — GABAPENTIN 300 MG PO CAPS: 600.0000 mg | ORAL_CAPSULE | Freq: Three times a day (TID) | ORAL | 22 refills | Status: DC

## 2018-10-27 MED ORDER — CLINDAMYCIN HCL 300 MG PO CAPS: 300.0000 mg | ORAL_CAPSULE | Freq: Two times a day (BID) | ORAL | 0 refills | Status: DC

## 2018-10-27 MED ORDER — PROPRANOLOL HCL 10 MG PO TABS: 10.0000 mg | ORAL_TABLET | Freq: Every day | ORAL | 1 refills | Status: DC

## 2018-10-27 MED ORDER — ZIPRASIDONE HCL 80 MG PO CAPS: 80.0000 mg | ORAL_CAPSULE | Freq: Two times a day (BID) | ORAL | 2 refills | Status: DC

## 2018-10-27 MED ORDER — LEVETIRACETAM 500 MG PO TABS: 500.0000 mg | ORAL_TABLET | Freq: Two times a day (BID) | ORAL | 1 refills | Status: DC

## 2018-10-27 MED ORDER — ACAMPROSATE CALCIUM 333 MG PO TBEC: 666.0000 mg | DELAYED_RELEASE_TABLET | Freq: Three times a day (TID) | ORAL | 2 refills | Status: DC
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
## 2018-10-27 NOTE — Plan of Care (Signed)
  Problem: Education: Goal: Emotional status will improve Outcome: Progressing Goal: Verbalization of understanding the information provided will improve Outcome: Progressing

## 2018-10-27 NOTE — Progress Notes (Signed)
Recreation Therapy Notes  INPATIENT RECREATION TR PLAN  Patient Details Name: Dawn Foley MRN: [id number] DOB: [birth year] Today's Date: 10/27/2018  Rec Therapy Plan Is patient appropriate for Therapeutic Recreation?: Yes Treatment times per week: about 3 days Estimated Length of Stay: 5-7 days TR Treatment/Interventions: Group participation (Comment)  Discharge Criteria Pt will be discharged from therapy if:: Discharged Treatment plan/goals/alternatives discussed and agreed upon by:: Patient/family  Discharge Summary Short term goals set: See patient care plan Short term goals met: Adequate for discharge Progress toward goals comments: Groups attended Which groups?: Anger management, Wellness, Other (Comment)(Team building) Reason goals not met: None Therapeutic equipment acquired: N/A Reason patient discharged from therapy: Discharge from hospital Pt/family agrees with progress & goals achieved: Yes Date patient discharged from therapy: 10/27/18    Victorino Sparrow, LRT/CTRS  [place]ia Comment, Devanshi Califf A 10/27/2018, 11:43 AM

## 2018-10-27 NOTE — BHH Suicide Risk Assessment (Signed)
[facility] Discharge Suicide Risk Assessment   Principal Problem: self-harm gesture/affective instability/substance abuse Discharge Diagnoses: Active Problems:   MDD (major depressive disorder), severe (HCC) Currently is alert and oriented to person place time situation without thoughts of harming self or others and contracting fully mood is finally stable has benefited from escalation in Geodon by her report no EPS or TD  Total Time spent with patient: 45 minutes Mental Status Per Nursing Assessment::   On Admission:  Suicidal ideation indicated by patient, Self-harm thoughts  Demographic Factors:  Caucasian  Loss Factors: Decrease in vocational status  Historical Factors: NA  Risk Reduction Factors:   Religious beliefs about death  Continued Clinical Symptoms:  Alcohol/Substance Abuse/Dependencies  Cognitive Features That Contribute To Risk:  Polarized thinking    Suicide Risk:  Minimal: No identifiable suicidal ideation.  Patients presenting with no risk factors but with morbid ruminations; may be classified as minimal risk based on the severity of the depressive symptoms  Follow-up Information    [facility] Follow up on 10/30/2018.   Why:  Hospital follow up appointment is Thursday, 2/20 at 8:30a. Please bring your photo ID, proof of insurance, current medications, and discharge paperwork from this hospitalization. Contact information: [address] [place] [address] [phone]           Plan Of Care/Follow-up recommendations:  Activity:  full  Clarnce Homan, MD 10/27/2018, 9:57 AM

## 2018-10-27 NOTE — Progress Notes (Signed)
  [facility] Adult Case Management Discharge Plan :  Will you be returning to the same living situation after discharge:  No.Pt will be admitted to [facility] in [facility].  At discharge, do you have transportation home?: Yes,  friend Do you have the ability to pay for your medications: No. Will work with [facility].   Release of information consent forms completed and in the chart;  Patient's signature needed at discharge.  Patient to Follow up at: Follow-up Information    [facility] Follow up on 10/30/2018.   Why:  Please attend your follow up appt on Thursday, 10/30/18, at 8:30am.  Please bring photo ID and your hospital discharge paperwork.  Contact information: [address] [place] [address] [phone]        [facility]. Go on 10/27/2018.   Why:  Please go to [facility] today for admission to their program.  Contact information: [address] [place], [place] [address] P: [phone]          Next level of care provider has access to [place] and Suicide Prevention discussed: No. Attempts made.   Have you used any form of tobacco in the last 30 days? (Cigarettes, Smokeless Tobacco, Cigars, and/or Pipes): Yes  Has patient been referred to the Quitline?: Patient refused referral  Patient has been referred for addiction treatment: Yes, [facility] and [facility].   Joanne Chars, LCSW 10/27/2018, 10:32 AM

## 2018-10-27 NOTE — BHH Suicide Risk Assessment (Signed)
[place] INPATIENT:  Family/Significant Other Suicide Prevention Education  Suicide Prevention Education:  Contact Attempts: Sandi Mariscal, Mother [phone]) has been identified by the patient as the family member/significant other with whom the patient will be residing, and identified as the person(s) who will aid the patient in the event of a mental health crisis.  With written consent from the patient, two attempts were made to provide suicide prevention education, prior to and/or following the patient's discharge.  We were unsuccessful in providing suicide prevention education.  A suicide education pamphlet was given to the patient to share with family/significant other.   Date and time of second attempt:10/27/18, 1002  Joanne Chars, LCSW 10/27/2018, 10:02 AM

## 2018-10-27 NOTE — Progress Notes (Signed)
Recreation Therapy Notes  Date: 2.17.20 Time: 1000 Location:  [address] Dayroom  Group Topic: Wellness  Goal Area(s) Addresses:  Patient will define components of whole wellness. Patient will verbalize benefit of whole wellness.  Behavioral Response: Minimal  Intervention:  Exercise  Activity: Exercise.  LRT led patients in a series of stretches to loosen them up.  Each patient was given the opportunity to lead the group in an exercise of their choice.  The group was to complete at least 30 minutes of exercise.  Patients were to take breaks and get water as needed.  Education: Wellness, Dentist.   Education Outcome: Acknowledges education/In group clarification offered/Needs additional education.   Clinical Observations/Feedback: Pt did the stretching.  Pt didn't do many of the exercises because she stated she was lazy.  Pt was in and out of group because she was focused on being discharged.   Victorino Sparrow, LRT/CTRS         Victorino Sparrow A 10/27/2018 11:34 AM
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
## 2018-10-27 NOTE — Progress Notes (Signed)
D:  Dawn Foley was up and visible on the unit.  She was noted sitting in the day room much of the evening interacting well with staff and peers.  She did attend evening wrap up group.  She denied SI/HI or a/V hallucinations.  She was noted a few times being loud and argumentative on the phone but was easily redirected.  RN went down to have her take off her TED hose at 10pm but she was already asleep.  RN assisted her in taking them off but she refused to put them back on at that time as she didn't want to wake up.  She took her hs medication well and is currently resting with her eyes closed and appears to be asleep. A:  1:1 with RN for support and encouragement.  Medications as ordered.  Q 15 minute checks maintained for safety.  Encouraged participation in group and unit activities.   R:  [name] remains safe on the unit.  We will continue to monitor the progress towards her goals.

## 2018-10-27 NOTE — Progress Notes (Signed)
D: Pt A & O X 4. Denies SI, HI, AVH and pain at this time. Presents anxious / fidgety related to d/c "I want to leave, I'm ready to go now, my ride is coming to get me soon and I need to be ready". D/C home as ordered. Bus passes ([place] [employer] [place]) given to pt at time of d/c for transportation.  A: D/C instructions reviewed with pt including prescriptions, medication samples and follow up appointments; compliance encouraged. All belongings from locker # 33 given to pt at time of departure. Scheduled and PRN medications given with verbal education and effects monitored. Safety checks maintained without self harm gestures or outburst to note at this time.  R: Pt receptive to care. Compliant with medications when offered. Denies adverse drug reactions when assessed. Verbalized understanding related to d/c instructions. Signed belonging sheet in agreement with items received from locker. Ambulatory with a steady gait. Appears to be in no physical distress at time of departure.

## 2018-10-27 NOTE — Plan of Care (Signed)
Pt was able to identify some triggers at completion of recreation therapy group sessions.   Victorino Sparrow, LRT/CTRS

## 2019-03-27 DIAGNOSIS — R109 Unspecified abdominal pain: Secondary | ICD-10-CM

## 2019-03-27 DIAGNOSIS — K760 Fatty (change of) liver, not elsewhere classified: Secondary | ICD-10-CM

## 2019-03-27 DIAGNOSIS — F10239 Alcohol dependence with withdrawal, unspecified: Secondary | ICD-10-CM

## 2019-03-27 DIAGNOSIS — E872 Acidosis: Secondary | ICD-10-CM

## 2019-03-27 DIAGNOSIS — M6282 Rhabdomyolysis: Secondary | ICD-10-CM

## 2019-05-11 ENCOUNTER — Emergency Department (HOSPITAL_COMMUNITY)
Admission: EM | Admit: 2019-05-11 | Discharge: 2019-05-14 | Disposition: A | Discharge status: Home / Self Care | Payer: Self-pay | Attending: Emergency Medicine | Admitting: Emergency Medicine

## 2019-05-11 ENCOUNTER — Encounter (HOSPITAL_COMMUNITY): Payer: Self-pay

## 2019-05-11 ENCOUNTER — Other Ambulatory Visit: Payer: Self-pay

## 2019-05-11 DIAGNOSIS — F333 Major depressive disorder, recurrent, severe with psychotic symptoms: Secondary | ICD-10-CM | POA: Insufficient documentation

## 2019-05-11 DIAGNOSIS — Z85038 Personal history of other malignant neoplasm of large intestine: Secondary | ICD-10-CM | POA: Insufficient documentation

## 2019-05-11 DIAGNOSIS — Z20828 Contact with and (suspected) exposure to other viral communicable diseases: Secondary | ICD-10-CM | POA: Insufficient documentation

## 2019-05-11 DIAGNOSIS — Y906 Blood alcohol level of 120-199 mg/100 ml: Secondary | ICD-10-CM | POA: Insufficient documentation

## 2019-05-11 DIAGNOSIS — Z008 Encounter for other general examination: Secondary | ICD-10-CM | POA: Insufficient documentation

## 2019-05-11 DIAGNOSIS — E039 Hypothyroidism, unspecified: Secondary | ICD-10-CM | POA: Insufficient documentation

## 2019-05-11 DIAGNOSIS — F17210 Nicotine dependence, cigarettes, uncomplicated: Secondary | ICD-10-CM | POA: Insufficient documentation

## 2019-05-11 DIAGNOSIS — F10229 Alcohol dependence with intoxication, unspecified: Secondary | ICD-10-CM | POA: Insufficient documentation

## 2019-05-11 DIAGNOSIS — Z79899 Other long term (current) drug therapy: Secondary | ICD-10-CM | POA: Insufficient documentation

## 2019-05-11 DIAGNOSIS — F1014 Alcohol abuse with alcohol-induced mood disorder: Secondary | ICD-10-CM | POA: Diagnosis present

## 2019-05-11 DIAGNOSIS — R569 Unspecified convulsions: Secondary | ICD-10-CM | POA: Insufficient documentation

## 2019-05-11 DIAGNOSIS — R45851 Suicidal ideations: Secondary | ICD-10-CM | POA: Insufficient documentation

## 2019-05-11 DIAGNOSIS — F603 Borderline personality disorder: Secondary | ICD-10-CM

## 2019-05-11 DIAGNOSIS — J45909 Unspecified asthma, uncomplicated: Secondary | ICD-10-CM | POA: Insufficient documentation

## 2019-05-11 DIAGNOSIS — N189 Chronic kidney disease, unspecified: Secondary | ICD-10-CM | POA: Insufficient documentation

## 2019-05-11 LAB — RAPID URINE DRUG SCREEN, HOSP PERFORMED
Amphetamines: NOT DETECTED
Barbiturates: NOT DETECTED
Benzodiazepines: POSITIVE — AB
Cocaine: NOT DETECTED
Opiates: NOT DETECTED
Tetrahydrocannabinol: NOT DETECTED

## 2019-05-11 LAB — COMPREHENSIVE METABOLIC PANEL
ALT: 64 U/L — ABNORMAL HIGH (ref 0–44)
AST: 102 U/L — ABNORMAL HIGH (ref 15–41)
Albumin: 4.6 g/dL (ref 3.5–5.0)
Alkaline Phosphatase: 63 U/L (ref 38–126)
Anion gap: 17 — ABNORMAL HIGH (ref 5–15)
BUN: 5 mg/dL — ABNORMAL LOW (ref 6–20)
CO2: 19 mmol/L — ABNORMAL LOW (ref 22–32)
Calcium: 9.2 mg/dL (ref 8.9–10.3)
Chloride: 103 mmol/L (ref 98–111)
Creatinine, Ser: 0.52 mg/dL (ref 0.44–1.00)
GFR calc Af Amer: >60 mL/min (ref >60)
GFR calc non Af Amer: >60 mL/min (ref >60)
Glucose, Bld: 74 mg/dL (ref 70–99)
Potassium: 3.7 mmol/L (ref 3.5–5.1)
Sodium: 139 mmol/L (ref 135–145)
Total Bilirubin: 0.4 mg/dL (ref 0.3–1.2)
Total Protein: 9.4 g/dL — ABNORMAL HIGH (ref 6.5–8.1)

## 2019-05-11 LAB — CBC WITH DIFFERENTIAL/PLATELET
Abs Immature Granulocytes: 0.02 10*3/uL (ref 0.00–0.07)
Basophils Absolute: 0 10*3/uL (ref 0.0–0.1)
Basophils Relative: 0 %
Eosinophils Absolute: 0.1 10*3/uL (ref 0.0–0.5)
Eosinophils Relative: 2 %
HCT: 40.7 % (ref 36.0–46.0)
Hemoglobin: 12.5 g/dL (ref 12.0–15.0)
Immature Granulocytes: 1 %
Lymphocytes Relative: 46 %
Lymphs Abs: 1.5 10*3/uL (ref 0.7–4.0)
MCH: 22.9 pg — ABNORMAL LOW (ref 26.0–34.0)
MCHC: 30.7 g/dL (ref 30.0–36.0)
MCV: 74.5 fL — ABNORMAL LOW (ref 80.0–100.0)
Monocytes Absolute: 0.2 10*3/uL (ref 0.1–1.0)
Monocytes Relative: 7 %
Neutro Abs: 1.4 10*3/uL — ABNORMAL LOW (ref 1.7–7.7)
Neutrophils Relative %: 44 %
Platelets: 91 10*3/uL — ABNORMAL LOW (ref 150–400)
RBC: 5.46 MIL/uL — ABNORMAL HIGH (ref 3.87–5.11)
RDW: 19.9 % — ABNORMAL HIGH (ref 11.5–15.5)
WBC: 3.1 10*3/uL — ABNORMAL LOW (ref 4.0–10.5)
nRBC: 0 % (ref 0.0–0.2)

## 2019-05-11 LAB — I-STAT BETA HCG BLOOD, ED (MC, WL, AP ONLY): I-stat hCG, quantitative: <5 m[IU]/mL (ref <5)

## 2019-05-11 LAB — ETHANOL: Alcohol, Ethyl (B): 179 mg/dL — ABNORMAL HIGH (ref <10)

## 2019-05-11 LAB — SALICYLATE LEVEL: Salicylate Lvl: <7 mg/dL (ref 2.8–30.0)

## 2019-05-11 LAB — ACETAMINOPHEN LEVEL: Acetaminophen (Tylenol), Serum: <10 ug/mL — ABNORMAL LOW (ref 10–30)

## 2019-05-11 MED ORDER — LORAZEPAM 2 MG/ML IJ SOLN: 0.0000 mg | Freq: Two times a day (BID) | INTRAMUSCULAR | Status: DC

## 2019-05-11 MED ORDER — PROMETHAZINE HCL 25 MG/ML IJ SOLN
25.0000 mg | Freq: Once | INTRAMUSCULAR | Status: AC
  Administered 2019-05-12: 25 mg via INTRAVENOUS
  Filled 2019-05-11: qty 1

## 2019-05-11 MED ORDER — LEVETIRACETAM IN NACL 1000 MG/100ML IV SOLN
1000.0000 mg | Freq: Once | INTRAVENOUS | Status: AC
  Administered 2019-05-11: 23:00:00 1000 mg via INTRAVENOUS
  Filled 2019-05-11: qty 100

## 2019-05-11 MED ORDER — SODIUM CHLORIDE 0.9 % IV BOLUS
2000.0000 mL | Freq: Once | INTRAVENOUS | Status: AC
  Administered 2019-05-12: 2000 mL via INTRAVENOUS

## 2019-05-11 MED ORDER — THIAMINE HCL 100 MG/ML IJ SOLN
100.0000 mg | Freq: Every day | INTRAMUSCULAR | Status: DC
  Administered 2019-05-12: 100 mg via INTRAVENOUS
  Filled 2019-05-11: qty 2

## 2019-05-11 MED ORDER — LORAZEPAM 2 MG/ML IJ SOLN
0.0000 mg | Freq: Four times a day (QID) | INTRAMUSCULAR | Status: DC
  Administered 2019-05-11 – 2019-05-12 (×2): 2 mg via INTRAVENOUS
  Filled 2019-05-11 (×2): qty 1

## 2019-05-11 MED ORDER — LORAZEPAM 1 MG PO TABS: 0.0000 mg | ORAL_TABLET | Freq: Two times a day (BID) | ORAL | Status: DC

## 2019-05-11 MED ORDER — VITAMIN B-1 100 MG PO TABS
100.0000 mg | ORAL_TABLET | Freq: Every day | ORAL | Status: DC
  Administered 2019-05-12: 100 mg via ORAL
  Filled 2019-05-11: qty 1

## 2019-05-11 MED ORDER — LORAZEPAM 1 MG PO TABS: 0.0000 mg | ORAL_TABLET | Freq: Four times a day (QID) | ORAL | Status: DC

## 2019-05-11 NOTE — ED Notes (Addendum)
Pt wanded by security. 
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
## 2019-05-11 NOTE — ED Notes (Signed)
Pt changed into scrubs and belongings placed at nurses station cabinets marked "Res A/16-18". Security called to wand.

## 2019-05-11 NOTE — ED Notes (Signed)
Pt changed into burgundy scrubs and yellow socks. All belongings placed into labeled bag

## 2019-05-12 ENCOUNTER — Encounter (HOSPITAL_COMMUNITY): Payer: Self-pay

## 2019-05-12 ENCOUNTER — Emergency Department (HOSPITAL_COMMUNITY): Payer: Self-pay

## 2019-05-12 LAB — LIPASE, BLOOD: Lipase: 41 U/L (ref 11–51)

## 2019-05-12 LAB — SARS CORONAVIRUS 2 BY RT PCR (HOSPITAL ORDER, PERFORMED IN ~~LOC~~ HOSPITAL LAB): SARS Coronavirus 2: NEGATIVE

## 2019-05-12 MED ORDER — LORAZEPAM 2 MG/ML IJ SOLN
2.0000 mg | Freq: Once | INTRAMUSCULAR | Status: AC
  Administered 2019-05-12: 2 mg via INTRAVENOUS
  Filled 2019-05-12: qty 1

## 2019-05-12 MED ORDER — TRAZODONE HCL 50 MG PO TABS
50.0000 mg | ORAL_TABLET | Freq: Every day | ORAL | Status: DC
  Administered 2019-05-12 – 2019-05-13 (×2): 50 mg via ORAL
  Filled 2019-05-12 (×2): qty 1

## 2019-05-12 MED ORDER — LORAZEPAM 1 MG PO TABS
0.0000 mg | ORAL_TABLET | Freq: Four times a day (QID) | ORAL | Status: DC
  Administered 2019-05-12 – 2019-05-13 (×4): 1 mg via ORAL
  Filled 2019-05-12 (×4): qty 1

## 2019-05-12 MED ORDER — HYDROXYZINE HCL 25 MG PO TABS
25.0000 mg | ORAL_TABLET | Freq: Four times a day (QID) | ORAL | Status: DC | PRN
  Administered 2019-05-12 – 2019-05-14 (×4): 25 mg via ORAL
  Filled 2019-05-12 (×4): qty 1

## 2019-05-12 MED ORDER — LORAZEPAM 2 MG/ML IJ SOLN: 0.0000 mg | Freq: Four times a day (QID) | INTRAMUSCULAR | Status: DC

## 2019-05-12 MED ORDER — VITAMIN B-1 100 MG PO TABS
100.0000 mg | ORAL_TABLET | Freq: Every day | ORAL | Status: DC
  Administered 2019-05-13: 11:00:00 100 mg via ORAL
  Filled 2019-05-12 (×2): qty 1

## 2019-05-12 MED ORDER — LOPERAMIDE HCL 2 MG PO CAPS
2.0000 mg | ORAL_CAPSULE | ORAL | Status: DC | PRN
  Administered 2019-05-12: 2 mg via ORAL
  Administered 2019-05-12: 4 mg via ORAL
  Administered 2019-05-12: 2 mg via ORAL
  Filled 2019-05-12 (×2): qty 1
  Filled 2019-05-12: qty 2

## 2019-05-12 MED ORDER — NICOTINE 21 MG/24HR TD PT24
21.0000 mg | MEDICATED_PATCH | Freq: Every day | TRANSDERMAL | Status: DC
  Administered 2019-05-12 – 2019-05-13 (×2): 21 mg via TRANSDERMAL
  Filled 2019-05-12 (×2): qty 1

## 2019-05-12 MED ORDER — QUETIAPINE FUMARATE 300 MG PO TABS
300.0000 mg | ORAL_TABLET | Freq: Three times a day (TID) | ORAL | Status: DC
  Administered 2019-05-12 – 2019-05-14 (×6): 300 mg via ORAL
  Filled 2019-05-12 (×6): qty 1

## 2019-05-12 MED ORDER — MELOXICAM 7.5 MG PO TABS
7.5000 mg | ORAL_TABLET | Freq: Every day | ORAL | Status: DC
  Filled 2019-05-12: qty 1

## 2019-05-12 MED ORDER — LEVOTHYROXINE SODIUM 100 MCG PO TABS
100.0000 ug | ORAL_TABLET | Freq: Every day | ORAL | Status: DC
  Administered 2019-05-12 – 2019-05-14 (×3): 100 ug via ORAL
  Filled 2019-05-12 (×3): qty 1

## 2019-05-12 MED ORDER — CHLORDIAZEPOXIDE HCL 25 MG PO CAPS
25.0000 mg | ORAL_CAPSULE | Freq: Four times a day (QID) | ORAL | Status: DC | PRN
  Administered 2019-05-12 (×2): 25 mg via ORAL
  Filled 2019-05-12 (×2): qty 1

## 2019-05-12 MED ORDER — LEVETIRACETAM 500 MG PO TABS
500.0000 mg | ORAL_TABLET | Freq: Two times a day (BID) | ORAL | Status: DC
  Administered 2019-05-12 – 2019-05-14 (×6): 500 mg via ORAL
  Filled 2019-05-12 (×6): qty 1

## 2019-05-12 MED ORDER — LORAZEPAM 2 MG/ML IJ SOLN: 0.0000 mg | Freq: Two times a day (BID) | INTRAMUSCULAR | Status: DC

## 2019-05-12 MED ORDER — HALOPERIDOL 5 MG PO TABS
10.0000 mg | ORAL_TABLET | Freq: Three times a day (TID) | ORAL | Status: DC
  Administered 2019-05-12 – 2019-05-14 (×6): 10 mg via ORAL
  Filled 2019-05-12 (×6): qty 2

## 2019-05-12 MED ORDER — VITAMIN B-1 100 MG PO TABS
100.0000 mg | ORAL_TABLET | Freq: Every day | ORAL | Status: DC
  Administered 2019-05-12 – 2019-05-14 (×2): 100 mg via ORAL
  Filled 2019-05-12: qty 1

## 2019-05-12 MED ORDER — METOCLOPRAMIDE HCL 10 MG PO TABS
10.0000 mg | ORAL_TABLET | Freq: Once | ORAL | Status: DC
  Filled 2019-05-12: qty 1

## 2019-05-12 MED ORDER — LORAZEPAM 1 MG PO TABS
0.0000 mg | ORAL_TABLET | Freq: Two times a day (BID) | ORAL | Status: DC
  Administered 2019-05-13: 1 mg via ORAL

## 2019-05-12 MED ORDER — IBUPROFEN 200 MG PO TABS
600.0000 mg | ORAL_TABLET | Freq: Three times a day (TID) | ORAL | Status: DC | PRN
  Administered 2019-05-14: 600 mg via ORAL
  Filled 2019-05-12: qty 3

## 2019-05-12 MED ORDER — METOCLOPRAMIDE HCL 5 MG/ML IJ SOLN
10.0000 mg | INTRAMUSCULAR | Status: AC
  Administered 2019-05-12: 09:00:00 10 mg via INTRAVENOUS
  Filled 2019-05-12: qty 2

## 2019-05-12 MED ORDER — THIAMINE HCL 100 MG/ML IJ SOLN: 100.0000 mg | Freq: Every day | INTRAMUSCULAR | Status: DC

## 2019-05-12 NOTE — ED Notes (Signed)
Nausea resolving, resting quielty

## 2019-05-12 NOTE — ED Notes (Signed)
Up to the phone

## 2019-05-12 NOTE — ED Notes (Signed)
TTS at bedside. 

## 2019-05-12 NOTE — ED Notes (Addendum)
Pt reminded she needs to wear her mask, pt still refuses to keep her mask on.

## 2019-05-12 NOTE — Care Management (Signed)
Under review at [facility], [place]ora [place], [place], [place], [place], [place], [place], [place], [place], [place], [place], [place], [place], [place], [facility], [place], [place], [place], [place],

## 2019-05-12 NOTE — ED Notes (Signed)
Up to the bathroom w/ diarrhea

## 2019-05-12 NOTE — ED Notes (Signed)
Unable to reach psych MD/NP, edp updated.

## 2019-05-12 NOTE — ED Notes (Addendum)
[place] NP updated, will review meds

## 2019-05-12 NOTE — ED Notes (Signed)
Up to the bathroom 
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
## 2019-05-12 NOTE — ED Notes (Addendum)
Up to the bathroom, diarrhea.  Pt reports that the librium is not helping her withdrawls also, will notify MD

## 2019-05-12 NOTE — ED Notes (Signed)
Ate sandwich and water

## 2019-05-12 NOTE — ED Notes (Signed)
Pt resting comfortably. No acute distress. Sitter at bedside

## 2019-05-12 NOTE — ED Notes (Signed)
Pt continuously removing cardiac monitoring, bp cuff and pulse ox despite pt education regarding importance of them.
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
## 2019-05-12 NOTE — ED Notes (Signed)
XR at bedside

## 2019-05-12 NOTE — ED Notes (Signed)
Pt refusing to keep mask on despite pt education on hospital policies and safety procedures.

## 2019-05-12 NOTE — ED Notes (Addendum)
Pt removed cardiac monitoring, bp cuff, and pulse ox. Pt refuses to keep any of the monitors on.

## 2019-05-12 NOTE — ED Notes (Signed)
Pelvic materials at bedside

## 2019-05-12 NOTE — ED Notes (Signed)
2 pt belonging bags with green labels taken to [facility].

## 2019-05-12 NOTE — Progress Notes (Signed)
Received Dawn Foley in her room in her bed wrapped up in several blankets. She was assessed and given a CIWA score of 11 and medicated per order. She was encouraged to drink fluids and a pitcher of water is at the bedside. She continued to sleep throughout the night without distress. She verbalized feeling better this AM and slept well. CIWA score was 6 and medicated per order.

## 2019-05-12 NOTE — ED Notes (Signed)
PA Mia and writer attempted to complete pelvic exam but pt refused because she is "in too much pain". Pt also refused vitals to be rechecked.

## 2019-05-12 NOTE — ED Notes (Signed)
Pt refusing to keep cardiac monitoring leads on due to discomfort. Pt informed of importance. Pt still refusing and keeps pulling them off.

## 2019-05-12 NOTE — ED Notes (Signed)
Pt ambulatory w/o difficulty from ed, wanded PTA

## 2019-05-13 DIAGNOSIS — F1014 Alcohol abuse with alcohol-induced mood disorder: Secondary | ICD-10-CM | POA: Diagnosis present

## 2019-05-13 MED ORDER — HYDROCERIN EX CREA
1.0000 "application " | TOPICAL_CREAM | CUTANEOUS | Status: DC | PRN
  Administered 2019-05-13: 1 via TOPICAL
  Filled 2019-05-13: qty 113

## 2019-05-13 MED ORDER — ALUM & MAG HYDROXIDE-SIMETH 200-200-20 MG/5ML PO SUSP
15.0000 mL | Freq: Four times a day (QID) | ORAL | Status: DC | PRN
  Administered 2019-05-13: 15 mL via ORAL
  Filled 2019-05-13: qty 30

## 2019-05-13 MED ORDER — LORAZEPAM 1 MG PO TABS
1.0000 mg | ORAL_TABLET | Freq: Once | ORAL | Status: AC
  Administered 2019-05-13: 1 mg via ORAL
  Filled 2019-05-13: qty 1

## 2019-05-13 MED ORDER — NAPHAZOLINE-GLYCERIN 0.012-0.2 % OP SOLN
1.0000 [drp] | Freq: Four times a day (QID) | OPHTHALMIC | Status: DC | PRN
  Filled 2019-05-13: qty 15

## 2019-05-13 MED ORDER — PANTOPRAZOLE SODIUM 20 MG PO TBEC
20.0000 mg | DELAYED_RELEASE_TABLET | Freq: Every day | ORAL | Status: DC
  Administered 2019-05-14 (×2): 20 mg via ORAL
  Filled 2019-05-13 (×2): qty 1
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
## 2019-05-13 NOTE — ED Notes (Signed)
Pt is pacing the halls and pushing on all of the doors demanding to leave because she is not able to get her medication at the very second she asks for it.

## 2019-05-13 NOTE — ED Notes (Signed)
Pt is very focused on and dependent on her medications. As long as she gets her medications she is calm and cooperative.  A few times on the telephone she had some explosive anger.

## 2019-05-14 DIAGNOSIS — F603 Borderline personality disorder: Secondary | ICD-10-CM

## 2019-05-14 MED ORDER — GABAPENTIN 300 MG PO CAPS
300.0000 mg | ORAL_CAPSULE | Freq: Three times a day (TID) | ORAL | Status: DC
  Administered 2019-05-14: 14:00:00 300 mg via ORAL
  Filled 2019-05-14: qty 1

## 2019-05-14 MED ORDER — MENTHOL 3 MG MT LOZG
1.0000 | LOZENGE | OROMUCOSAL | Status: DC | PRN
  Filled 2019-05-14: qty 9

## 2019-05-14 NOTE — BH Assessment (Signed)
[facility] Assessment Progress Note  Per Dawn Dresser, DO, this pt does not require psychiatric hospitalization at this time.  Pt is to be discharged from [facility] with outpatient referrals.  Discharge instructions advise pt to follow up with [facility] in [place], pt's listed community of residence.  Pt's nurse, Nena Jordan, has been notified.  Dawn Foley, [place] Triage Specialist [phone]

## 2019-05-14 NOTE — ED Notes (Signed)
Pt discharged safely with all belongings .Marland Kitchen  Pt threw away her clothes as she said they were dirty .

## 2019-05-14 NOTE — Consult Note (Addendum)
[facility] Psych ED Discharge  05/14/2019 1:53 PM Dawn Foley  MRN:  [id number] Principal Problem: Alcohol abuse with alcohol-induced mood disorder [facility]) Discharge Diagnoses: Principal Problem:   Alcohol abuse with alcohol-induced mood disorder ([place]) Active Problems:   Borderline personality disorder in adult [facility])   Subjective: Pt was seen and chart reviewed with treatment team and Dr Mariea Clonts. Pt denies suicidal/homicidal ideation, denies auditory/visual hallucinations and does not appear to be responding to internal stimuli. Pt has been at the [facility] since 05/11/2019 with alcohol intoxication and alcohol use disorder. Pt has a history of alcohol abuse and frequently presents to the emergency room while intoxicated. She has a history of med seeking while in the hospital. Her BAL on admission was 179, UDS positive for benzos. She has been non-compliant with her home medications and told her grandmother she was going to shoot herself while she was intoxicated. She has a history of seizure disorder secondary to alcohol withdrawal. She was informed this morning that her Ativan would be discontinued and she wanted to know when her last dose would be. She is now requesting to be discharged and denies any suicidal or homicidal intent or plan. Her mother was contacted and denies concerns for her safety. Peer Support consult for substance abuse treatment options in the community. Pt is psychiatrically clear.   Total Time spent with patient: 30 minutes  Past Psychiatric History: As above  Past Medical History:  Past Medical History:  Diagnosis Date  . Alcoholism ([place])   . Anemia   . Anxiety   . Arthritis   . Asthma    as a child  . Bipolar disorder ([place])   . Cancer ([place])    colon  . Chronic kidney disease   . Complication of anesthesia    woke up during colonoscopy  . Depression   . Elevated liver enzymes   . GERD (gastroesophageal reflux disease)   . H/O alcohol abuse    clean for 1 month  as of 07/17/17  . Heart murmur    per [facility] per PT  . Hepatitis    denies  . Hepatitis C   . Hypothyroidism   . Mallory-Weiss tear   . Nicotine dependence   . Schizophrenia ([place])   . Seizures ([place])    seizures - most recent 05/2017, supposed to be on Tegretol but can't afford  . Thrombocytopenia ([facility])    Overview:  alcoholism  . Tibial plateau fracture, right   . Upper GI bleed     Past Surgical History:  Procedure Laterality Date  . APPENDECTOMY    . DILATION AND CURETTAGE OF UTERUS    . ESOPHAGOGASTRODUODENOSCOPY (EGD) WITH PROPOFOL N/A 09/04/2016   Procedure: ESOPHAGOGASTRODUODENOSCOPY (EGD) WITH PROPOFOL;  Surgeon: Doran Stabler, MD;  Location: [place];  Service: Endoscopy;  Laterality: N/A;  . HARDWARE REMOVAL Right 06/18/2017   Procedure: REMOVAL RIGHT EXTERNAL FIXATOR;  Surgeon: Altamese Sayre, MD;  Location: [place];  Service: Orthopedics;  Laterality: Right;  . HERNIA REPAIR     umbilical hernia  . KNEE CLOSED REDUCTION Right 06/18/2017   Procedure: CLOSED MANIPULATION UNDER ANESTHESIA RIGHT KNEE;  Surgeon: Altamese Middlebourne, MD;  Location: [place];  Service: Orthopedics;  Laterality: Right;  . ORIF FEMUR FRACTURE Right 07/18/2017   Procedure: REPAIR NONUNION WITH RIA;  Surgeon: Altamese Millstone, MD;  Location: [place];  Service: Orthopedics;  Laterality: Right;  . ORIF TIBIA PLATEAU     Family History:  Family History  Problem  Relation Age of Onset  . Mental illness Other   . Thyroid disease Other   . Alcoholism Brother    Family Psychiatric  History: As above Social History:  Social History   Substance and Sexual Activity  Alcohol Use Yes  . Alcohol/week: 16.0 standard drinks  . Types: 16 Standard drinks or equivalent per week   Comment: 1/2 gallon liquor daily     Social History   Substance and Sexual Activity  Drug Use No    Social History   Socioeconomic History  . Marital status: Single    Spouse name: Not on file  . Number of children:  Not on file  . Years of education: Not on file  . Highest education level: Not on file  Occupational History  . Not on file  Social Needs  . Financial resource strain: Not on file  . Food insecurity    Worry: Not on file    Inability: Not on file  . Transportation needs    Medical: Not on file    Non-medical: Not on file  Tobacco Use  . Smoking status: Current Every Day Smoker    Packs/day: 1.00    Types: Cigarettes  . Smokeless tobacco: Never Used  Substance and Sexual Activity  . Alcohol use: Yes    Alcohol/week: 16.0 standard drinks    Types: 16 Standard drinks or equivalent per week    Comment: 1/2 gallon liquor daily  . Drug use: No  . Sexual activity: Not Currently    Birth control/protection: None  Lifestyle  . Physical activity    Days per week: Not on file    Minutes per session: Not on file  . Stress: Not on file  Relationships  . Social Herbalist on phone: Not on file    Gets together: Not on file    Attends religious service: Not on file    Active member of club or organization: Not on file    Attends meetings of clubs or organizations: Not on file    Relationship status: Not on file  Other Topics Concern  . Not on file  Social History Narrative  . Not on file    Has this patient used any form of tobacco in the last 30 days? (Cigarettes, Smokeless Tobacco, Cigars, and/or Pipes) Prescription not provided because: Pt denies tobacco use  Current Medications: Current Facility-Administered Medications  Medication Dose Route Frequency Provider Last Rate Last Dose  . alum & mag hydroxide-simeth (MAALOX/MYLANTA) 200-200-20 MG/5ML suspension 15 mL  15 mL Oral Q6H PRN Charlesetta Shanks, MD   15 mL at 05/13/19 2242  . gabapentin (NEURONTIN) capsule 300 mg  300 mg Oral TID Ethelene Hal, NP   300 mg at 05/14/19 1338  . haloperidol (HALDOL) tablet 10 mg  10 mg Oral TID Little, Wenda Overland, MD   10 mg at 05/14/19 0923  . hydrocerin (EUCERIN) cream  1 application  1 application Topical PRN Suella Broad, FNP   1 application at [phone] [id number]  . hydrOXYzine (ATARAX/VISTARIL) tablet 25 mg  25 mg Oral Q6H PRN Rankin, Shuvon B, NP   25 mg at 05/14/19 1338  . ibuprofen (ADVIL) tablet 600 mg  600 mg Oral Q8H PRN McDonald, Mia A, PA-C   600 mg at 05/14/19 0530  . levETIRAcetam (KEPPRA) tablet 500 mg  500 mg Oral BID McDonald, Mia A, PA-C   500 mg at 05/14/19 0922  . levothyroxine (SYNTHROID) tablet 100 mcg  100 mcg Oral Q0600 McDonald, Mia A, PA-C   100 mcg at 05/14/19 0530  . loperamide (IMODIUM) capsule 2-4 mg  2-4 mg Oral PRN Rankin, Shuvon B, NP   2 mg at 05/12/19 1538  . menthol-cetylpyridinium (CEPACOL) lozenge 3 mg  1 lozenge Oral PRN Ward, Kristen N, DO      . metoCLOPramide (REGLAN) tablet 10 mg  10 mg Oral Once Little, Wenda Overland, MD      . naphazoline-glycerin (CLEAR EYES REDNESS) ophth solution 1-2 drop  1-2 drop Both Eyes QID PRN Faythe Dingwall, DO      . nicotine (NICODERM CQ - dosed in mg/24 hours) patch 21 mg  21 mg Transdermal Daily McDonald, Mia A, PA-C   21 mg at 05/13/19 1030  . pantoprazole (PROTONIX) EC tablet 20 mg  20 mg Oral Daily Charlesetta Shanks, MD   20 mg at 05/14/19 0923  . QUEtiapine (SEROQUEL) tablet 300 mg  300 mg Oral TID Little, Wenda Overland, MD   300 mg at 05/14/19 [id number]  . thiamine (VITAMIN B-1) tablet 100 mg  100 mg Oral Daily Little, Wenda Overland, MD   100 mg at 05/14/19 [id number]   Or  . thiamine (B-1) injection 100 mg  100 mg Intravenous Daily Little, Wenda Overland, MD      . thiamine (VITAMIN B-1) tablet 100 mg  100 mg Oral Daily Rankin, Shuvon B, NP   100 mg at 05/13/19 1034  . traZODone (DESYREL) tablet 50 mg  50 mg Oral QHS McDonald, Mia A, PA-C   50 mg at 05/13/19 2147   Current Outpatient Medications  Medication Sig Dispense Refill  . haloperidol (HALDOL) 10 MG tablet Take 10 mg by mouth 3 (three) times daily.    Marland Kitchen levETIRAcetam (KEPPRA) 500 MG tablet Take 1 tablet (500 mg total) by mouth  2 (two) times daily. 60 tablet 1  . levothyroxine (SYNTHROID, LEVOTHROID) 100 MCG tablet Take 1 tablet (100 mcg total) by mouth daily at 6 (six) AM. 30 tablet 1  . naphazoline-glycerin (CLEAR EYES REDNESS) 0.012-0.2 % SOLN Place 1-2 drops into both eyes 4 (four) times daily as needed for eye irritation.    . nicotine (NICODERM CQ - DOSED IN MG/24 HOURS) 21 mg/24hr patch Place 21 mg onto the skin daily.    . QUEtiapine (SEROQUEL) 300 MG tablet Take 300 mg by mouth 3 (three) times daily.    . Skin Protectants, Misc. (EUCERIN) cream Apply 1 application topically as needed for dry skin.    Marland Kitchen traZODone (DESYREL) 50 MG tablet Take 50 mg by mouth at bedtime.       Musculoskeletal: Strength & Muscle Tone: within normal limits Gait & Station: normal Patient leans: N/A  Psychiatric Specialty Exam: Physical Exam  Nursing note and vitals reviewed. Constitutional: She is oriented to person, place, and time. She appears well-developed and well-nourished.  HENT:  Head: Normocephalic and atraumatic.  Neck: Normal range of motion.  Respiratory: Effort normal.  Musculoskeletal: Normal range of motion.  Neurological: She is alert and oriented to person, place, and time.  Psychiatric: Her speech is normal and behavior is normal. Judgment and thought content normal. Her mood appears anxious. Cognition and memory are normal.    Review of Systems  Psychiatric/Behavioral: Positive for substance abuse. Negative for suicidal ideas.  All other systems reviewed and are negative.   Blood pressure 103/89, pulse 96, temperature 98 F (36.7 C), temperature source Oral, resp. rate 18, height 5\' 3"  (1.6 m), weight 72.6  kg, last menstrual period 05/09/2019, SpO2 100 %.Body mass index is 28.34 kg/m.  General Appearance: Casual  Eye Contact:  Good  Speech:  Clear and Coherent and Normal Rate  Volume:  Normal  Mood:  Anxious  Affect:  Congruent  Thought Process:  Coherent, Goal Directed and Descriptions of  Associations: Intact  Orientation:  Full (Time, Place, and Person)  Thought Content:  Logical  Suicidal Thoughts:  No  Homicidal Thoughts:  No  Memory:  Immediate;   Good Recent;   Good Remote;   Fair  Judgement:  Fair  Insight:  Fair  Psychomotor Activity:  Normal  Concentration:  Concentration: Good and Attention Span: Good  Recall:  Good  Fund of Knowledge:  Good  Language:  Good  Akathisia:  Negative  Handed:  Right  AIMS (if indicated):   N/A  Assets:  Agricultural consultant Housing Social Support  ADL's:  Intact  Cognition:  WNL  Sleep:   N/A     Demographic Factors:  Caucasian, Low socioeconomic status and Unemployed  Loss Factors: Financial problems/change in socioeconomic status  Historical Factors: Family history of mental illness or substance abuse  Risk Reduction Factors:   Sense of responsibility to family and Living with another person, especially a relative  Continued Clinical Symptoms:  Alcohol/Substance Abuse/Dependencies Personality Disorders:   Cluster B  Cognitive Features That Contribute To Risk:  Closed-mindedness    Suicide Risk:  Minimal: No identifiable suicidal ideation.  Patients presenting with no risk factors but with morbid ruminations; may be classified as minimal risk based on the severity of the depressive symptoms   Plan Of Care/Follow-up recommendations:  Activity:  as tolerated Diet:  Heart Healthy  Disposition and Treatment Plan: Alcohol abuse with alcohol-induced mood disorder ([place])  Take all medications as prescribed by your outpatient provider. Keep all follow-up appointments as scheduled.  Do not consume alcohol or use illegal drugs while on prescription medications. Report any adverse effects from your medications to your primary care provider promptly.  In the event of recurrent symptoms or worsening symptoms, call 911, a crisis hotline, or go to the nearest emergency department for  evaluation.   Ethelene Hal, NP 05/14/2019, 1:53 PM   Patient seen by telemedicine for psychiatric evaluation, chart reviewed and case discussed with the physician extender and developed treatment plan. Reviewed the information documented and agree with the treatment plan.  Buford Dresser, DO 05/14/19 4:18 PM

## 2019-05-14 NOTE — Discharge Instructions (Signed)
For your behavioral health needs, you are advised to follow up with [facility].  Contact them at your earliest opportunity to ask about scheduling an intake appointment:   [facility]  [address] [place], [place] [address]  [phone]

## 2019-05-14 NOTE — Patient Outreach (Signed)
CPSS met with the patient in order to provide substance use recovery support and help with getting connected to substance use treatment resources. Patient reports a history of alcohol use. Patient reports a past history of being connected to [facility] and [facility] in [place] for dual diagnosis substance use treatment. Patient has tried to get connected to [facility], but patient has upcoming court dates that keep the patient from being admitted to their residential treatment program. CPSS talked to the patient about and provided information for different types of substance use recovery resources. CPSS provided an residential/outpatient substance use treatment center list, AA meeting list for the [place] area, and CPSS contact information. CPSS strongly encouraged the patient to continue to stay in contact with CPSS for further substance use recovery support and help with getting connected to substance use treatment resources.

## 2019-05-14 NOTE — ED Notes (Signed)
Pt requesting  Medication became angry because staff was attending to the needs of other pts on the unit and verbalized thoughts of harming self and then requested to sign out AMA. Pt was informed that after verbalizing suicidal thought that [facility] discharge would not be allowed at this time.

## 2019-10-05 ENCOUNTER — Inpatient Hospital Stay (HOSPITAL_COMMUNITY)
Admission: EM | Admit: 2019-10-05 | Discharge: 2019-10-16 | DRG: 441 | Disposition: A | Discharge status: Hospice | Payer: Self-pay | Source: Other Acute Inpatient Hospital | Attending: Internal Medicine | Admitting: Internal Medicine

## 2019-10-05 DIAGNOSIS — D61818 Other pancytopenia: Secondary | ICD-10-CM | POA: Diagnosis present

## 2019-10-05 DIAGNOSIS — R739 Hyperglycemia, unspecified: Secondary | ICD-10-CM | POA: Diagnosis present

## 2019-10-05 DIAGNOSIS — Z811 Family history of alcohol abuse and dependence: Secondary | ICD-10-CM

## 2019-10-05 DIAGNOSIS — R188 Other ascites: Secondary | ICD-10-CM

## 2019-10-05 DIAGNOSIS — K7210 Chronic hepatic failure without coma: Secondary | ICD-10-CM

## 2019-10-05 DIAGNOSIS — E869 Volume depletion, unspecified: Secondary | ICD-10-CM | POA: Diagnosis present

## 2019-10-05 DIAGNOSIS — R451 Restlessness and agitation: Secondary | ICD-10-CM

## 2019-10-05 DIAGNOSIS — B1920 Unspecified viral hepatitis C without hepatic coma: Secondary | ICD-10-CM | POA: Diagnosis present

## 2019-10-05 DIAGNOSIS — G9341 Metabolic encephalopathy: Secondary | ICD-10-CM | POA: Diagnosis present

## 2019-10-05 DIAGNOSIS — F603 Borderline personality disorder: Secondary | ICD-10-CM | POA: Diagnosis present

## 2019-10-05 DIAGNOSIS — E870 Hyperosmolality and hypernatremia: Secondary | ICD-10-CM | POA: Diagnosis not present

## 2019-10-05 DIAGNOSIS — K7469 Other cirrhosis of liver: Secondary | ICD-10-CM | POA: Diagnosis present

## 2019-10-05 DIAGNOSIS — R748 Abnormal levels of other serum enzymes: Secondary | ICD-10-CM

## 2019-10-05 DIAGNOSIS — E876 Hypokalemia: Secondary | ICD-10-CM | POA: Diagnosis present

## 2019-10-05 DIAGNOSIS — Z6834 Body mass index (BMI) 34.0-34.9, adult: Secondary | ICD-10-CM

## 2019-10-05 DIAGNOSIS — E871 Hypo-osmolality and hyponatremia: Secondary | ICD-10-CM | POA: Diagnosis present

## 2019-10-05 DIAGNOSIS — D684 Acquired coagulation factor deficiency: Secondary | ICD-10-CM | POA: Diagnosis present

## 2019-10-05 DIAGNOSIS — Z66 Do not resuscitate: Secondary | ICD-10-CM | POA: Diagnosis not present

## 2019-10-05 DIAGNOSIS — K7011 Alcoholic hepatitis with ascites: Secondary | ICD-10-CM

## 2019-10-05 DIAGNOSIS — N179 Acute kidney failure, unspecified: Secondary | ICD-10-CM | POA: Diagnosis not present

## 2019-10-05 DIAGNOSIS — D539 Nutritional anemia, unspecified: Secondary | ICD-10-CM | POA: Diagnosis present

## 2019-10-05 DIAGNOSIS — R578 Other shock: Secondary | ICD-10-CM | POA: Diagnosis not present

## 2019-10-05 DIAGNOSIS — K7290 Hepatic failure, unspecified without coma: Secondary | ICD-10-CM

## 2019-10-05 DIAGNOSIS — F10239 Alcohol dependence with withdrawal, unspecified: Secondary | ICD-10-CM | POA: Diagnosis present

## 2019-10-05 DIAGNOSIS — Z85038 Personal history of other malignant neoplasm of large intestine: Secondary | ICD-10-CM

## 2019-10-05 DIAGNOSIS — E162 Hypoglycemia, unspecified: Secondary | ICD-10-CM | POA: Diagnosis present

## 2019-10-05 DIAGNOSIS — K529 Noninfective gastroenteritis and colitis, unspecified: Secondary | ICD-10-CM | POA: Diagnosis present

## 2019-10-05 DIAGNOSIS — Z515 Encounter for palliative care: Secondary | ICD-10-CM

## 2019-10-05 DIAGNOSIS — E873 Alkalosis: Secondary | ICD-10-CM | POA: Diagnosis present

## 2019-10-05 DIAGNOSIS — K7200 Acute and subacute hepatic failure without coma: Principal | ICD-10-CM | POA: Diagnosis present

## 2019-10-05 DIAGNOSIS — D6959 Other secondary thrombocytopenia: Secondary | ICD-10-CM | POA: Diagnosis present

## 2019-10-05 DIAGNOSIS — G9340 Encephalopathy, unspecified: Secondary | ICD-10-CM | POA: Diagnosis present

## 2019-10-05 DIAGNOSIS — F419 Anxiety disorder, unspecified: Secondary | ICD-10-CM | POA: Diagnosis present

## 2019-10-05 DIAGNOSIS — R0600 Dyspnea, unspecified: Secondary | ICD-10-CM

## 2019-10-05 DIAGNOSIS — F17210 Nicotine dependence, cigarettes, uncomplicated: Secondary | ICD-10-CM | POA: Diagnosis present

## 2019-10-05 DIAGNOSIS — D689 Coagulation defect, unspecified: Secondary | ICD-10-CM

## 2019-10-05 DIAGNOSIS — E039 Hypothyroidism, unspecified: Secondary | ICD-10-CM | POA: Diagnosis present

## 2019-10-05 DIAGNOSIS — G40909 Epilepsy, unspecified, not intractable, without status epilepticus: Secondary | ICD-10-CM | POA: Diagnosis present

## 2019-10-05 DIAGNOSIS — F259 Schizoaffective disorder, unspecified: Secondary | ICD-10-CM | POA: Diagnosis present

## 2019-10-05 DIAGNOSIS — Z9049 Acquired absence of other specified parts of digestive tract: Secondary | ICD-10-CM

## 2019-10-05 DIAGNOSIS — E778 Other disorders of glycoprotein metabolism: Secondary | ICD-10-CM | POA: Diagnosis present

## 2019-10-05 DIAGNOSIS — Z6833 Body mass index (BMI) 33.0-33.9, adult: Secondary | ICD-10-CM

## 2019-10-05 DIAGNOSIS — D731 Hypersplenism: Secondary | ICD-10-CM | POA: Diagnosis present

## 2019-10-05 DIAGNOSIS — K6389 Other specified diseases of intestine: Secondary | ICD-10-CM | POA: Diagnosis present

## 2019-10-05 DIAGNOSIS — E43 Unspecified severe protein-calorie malnutrition: Secondary | ICD-10-CM | POA: Diagnosis present

## 2019-10-05 DIAGNOSIS — R1084 Generalized abdominal pain: Secondary | ICD-10-CM

## 2019-10-05 LAB — GLUCOSE, CAPILLARY: Glucose-Capillary: 97 mg/dL (ref 70–99)

## 2019-10-05 MED ORDER — ORAL CARE MOUTH RINSE
15.0000 mL | Freq: Two times a day (BID) | OROMUCOSAL | Status: DC
  Administered 2019-10-06 – 2019-10-14 (×18): 15 mL via OROMUCOSAL

## 2019-10-05 MED ORDER — CHLORHEXIDINE GLUCONATE CLOTH 2 % EX PADS
6.0000 | MEDICATED_PAD | Freq: Every day | CUTANEOUS | Status: DC
  Administered 2019-10-05 – 2019-10-06 (×2): 6 via TOPICAL

## 2019-10-05 MED ORDER — CHLORHEXIDINE GLUCONATE 0.12 % MT SOLN
15.0000 mL | Freq: Two times a day (BID) | OROMUCOSAL | Status: DC
  Administered 2019-10-06 – 2019-10-16 (×21): 15 mL via OROMUCOSAL
  Filled 2019-10-05 (×11): qty 15

## 2019-10-06 ENCOUNTER — Inpatient Hospital Stay (HOSPITAL_COMMUNITY): Payer: Self-pay

## 2019-10-06 DIAGNOSIS — D689 Coagulation defect, unspecified: Secondary | ICD-10-CM

## 2019-10-06 DIAGNOSIS — R748 Abnormal levels of other serum enzymes: Secondary | ICD-10-CM

## 2019-10-06 DIAGNOSIS — G9340 Encephalopathy, unspecified: Secondary | ICD-10-CM | POA: Diagnosis present

## 2019-10-06 DIAGNOSIS — M7989 Other specified soft tissue disorders: Secondary | ICD-10-CM

## 2019-10-06 HISTORY — PX: IR PARACENTESIS: IMG2679

## 2019-10-06 LAB — COMPREHENSIVE METABOLIC PANEL
ALT: 184 U/L — ABNORMAL HIGH (ref 0–44)
AST: 1663 U/L — ABNORMAL HIGH (ref 15–41)
Albumin: 2.1 g/dL — ABNORMAL LOW (ref 3.5–5.0)
Alkaline Phosphatase: 132 U/L — ABNORMAL HIGH (ref 38–126)
Anion gap: 23 — ABNORMAL HIGH (ref 5–15)
BUN: 5 mg/dL — ABNORMAL LOW (ref 6–20)
CO2: 12 mmol/L — ABNORMAL LOW (ref 22–32)
Calcium: 8.2 mg/dL — ABNORMAL LOW (ref 8.9–10.3)
Chloride: 85 mmol/L — ABNORMAL LOW (ref 98–111)
Creatinine, Ser: 0.72 mg/dL (ref 0.44–1.00)
GFR calc Af Amer: >60 mL/min (ref >60)
GFR calc non Af Amer: >60 mL/min (ref >60)
Glucose, Bld: 98 mg/dL (ref 70–99)
Potassium: 3.1 mmol/L — ABNORMAL LOW (ref 3.5–5.1)
Sodium: 120 mmol/L — ABNORMAL LOW (ref 135–145)
Total Bilirubin: 23.7 mg/dL (ref 0.3–1.2)
Total Protein: 7.1 g/dL (ref 6.5–8.1)

## 2019-10-06 LAB — URINALYSIS, ROUTINE W REFLEX MICROSCOPIC
Glucose, UA: NEGATIVE mg/dL
Hgb urine dipstick: NEGATIVE
Ketones, ur: 5 mg/dL — AB
Nitrite: NEGATIVE
Protein, ur: NEGATIVE mg/dL
Specific Gravity, Urine: 1.044 — ABNORMAL HIGH (ref 1.005–1.030)
WBC, UA: >50 WBC/hpf — ABNORMAL HIGH (ref 0–5)
pH: 6 (ref 5.0–8.0)

## 2019-10-06 LAB — RENAL FUNCTION PANEL
Albumin: 2.1 g/dL — ABNORMAL LOW (ref 3.5–5.0)
Albumin: 2 g/dL — ABNORMAL LOW (ref 3.5–5.0)
Albumin: 2.2 g/dL — ABNORMAL LOW (ref 3.5–5.0)
Albumin: 2 g/dL — ABNORMAL LOW (ref 3.5–5.0)
Albumin: 2 g/dL — ABNORMAL LOW (ref 3.5–5.0)
Anion gap: 14 (ref 5–15)
Anion gap: 16 — ABNORMAL HIGH (ref 5–15)
Anion gap: 19 — ABNORMAL HIGH (ref 5–15)
Anion gap: 12 (ref 5–15)
Anion gap: 17 — ABNORMAL HIGH (ref 5–15)
BUN: 5 mg/dL — ABNORMAL LOW (ref 6–20)
BUN: 6 mg/dL (ref 6–20)
BUN: 7 mg/dL (ref 6–20)
BUN: 6 mg/dL (ref 6–20)
BUN: 6 mg/dL (ref 6–20)
CO2: 21 mmol/L — ABNORMAL LOW (ref 22–32)
CO2: 17 mmol/L — ABNORMAL LOW (ref 22–32)
CO2: 18 mmol/L — ABNORMAL LOW (ref 22–32)
CO2: 22 mmol/L (ref 22–32)
CO2: 16 mmol/L — ABNORMAL LOW (ref 22–32)
Calcium: 8 mg/dL — ABNORMAL LOW (ref 8.9–10.3)
Calcium: 7.6 mg/dL — ABNORMAL LOW (ref 8.9–10.3)
Calcium: 8.1 mg/dL — ABNORMAL LOW (ref 8.9–10.3)
Calcium: 7.7 mg/dL — ABNORMAL LOW (ref 8.9–10.3)
Calcium: 7.5 mg/dL — ABNORMAL LOW (ref 8.9–10.3)
Chloride: 89 mmol/L — ABNORMAL LOW (ref 98–111)
Chloride: 91 mmol/L — ABNORMAL LOW (ref 98–111)
Chloride: 92 mmol/L — ABNORMAL LOW (ref 98–111)
Chloride: 92 mmol/L — ABNORMAL LOW (ref 98–111)
Chloride: 88 mmol/L — ABNORMAL LOW (ref 98–111)
Creatinine, Ser: 0.46 mg/dL (ref 0.44–1.00)
Creatinine, Ser: 0.39 mg/dL — ABNORMAL LOW (ref 0.44–1.00)
Creatinine, Ser: 0.51 mg/dL (ref 0.44–1.00)
Creatinine, Ser: 0.51 mg/dL (ref 0.44–1.00)
Creatinine, Ser: <0.3 mg/dL — ABNORMAL LOW (ref 0.44–1.00)
GFR calc Af Amer: >60 mL/min (ref >60)
GFR calc Af Amer: >60 mL/min (ref >60)
GFR calc Af Amer: >60 mL/min (ref >60)
GFR calc Af Amer: >60 mL/min (ref >60)
GFR calc non Af Amer: >60 mL/min (ref >60)
GFR calc non Af Amer: >60 mL/min (ref >60)
GFR calc non Af Amer: >60 mL/min (ref >60)
GFR calc non Af Amer: >60 mL/min (ref >60)
Glucose, Bld: 95 mg/dL (ref 70–99)
Glucose, Bld: 106 mg/dL — ABNORMAL HIGH (ref 70–99)
Glucose, Bld: 94 mg/dL (ref 70–99)
Glucose, Bld: 128 mg/dL — ABNORMAL HIGH (ref 70–99)
Glucose, Bld: 121 mg/dL — ABNORMAL HIGH (ref 70–99)
Phosphorus: 2 mg/dL — ABNORMAL LOW (ref 2.5–4.6)
Phosphorus: 1.5 mg/dL — ABNORMAL LOW (ref 2.5–4.6)
Phosphorus: 2 mg/dL — ABNORMAL LOW (ref 2.5–4.6)
Phosphorus: 2.4 mg/dL — ABNORMAL LOW (ref 2.5–4.6)
Phosphorus: 2.1 mg/dL — ABNORMAL LOW (ref 2.5–4.6)
Potassium: 3.4 mmol/L — ABNORMAL LOW (ref 3.5–5.1)
Potassium: 3.5 mmol/L (ref 3.5–5.1)
Potassium: 3.7 mmol/L (ref 3.5–5.1)
Potassium: 2.8 mmol/L — ABNORMAL LOW (ref 3.5–5.1)
Potassium: 4.4 mmol/L (ref 3.5–5.1)
Sodium: 124 mmol/L — ABNORMAL LOW (ref 135–145)
Sodium: 126 mmol/L — ABNORMAL LOW (ref 135–145)
Sodium: 127 mmol/L — ABNORMAL LOW (ref 135–145)
Sodium: 126 mmol/L — ABNORMAL LOW (ref 135–145)
Sodium: 121 mmol/L — ABNORMAL LOW (ref 135–145)

## 2019-10-06 LAB — HEPATIC FUNCTION PANEL
ALT: 148 U/L — ABNORMAL HIGH (ref 0–44)
AST: 1588 U/L — ABNORMAL HIGH (ref 15–41)
Albumin: 2.1 g/dL — ABNORMAL LOW (ref 3.5–5.0)
Alkaline Phosphatase: 120 U/L (ref 38–126)
Bilirubin, Direct: 14.9 mg/dL — ABNORMAL HIGH (ref 0.0–0.2)
Indirect Bilirubin: 10.1 mg/dL — ABNORMAL HIGH (ref 0.3–0.9)
Total Bilirubin: 25 mg/dL (ref 0.3–1.2)
Total Protein: 6.9 g/dL (ref 6.5–8.1)

## 2019-10-06 LAB — CBC WITH DIFFERENTIAL/PLATELET
Abs Immature Granulocytes: 0.06 10*3/uL (ref 0.00–0.07)
Basophils Absolute: 0 10*3/uL (ref 0.0–0.1)
Basophils Relative: 0 %
Eosinophils Absolute: 0 10*3/uL (ref 0.0–0.5)
Eosinophils Relative: 0 %
HCT: 21 % — ABNORMAL LOW (ref 36.0–46.0)
Hemoglobin: 5.9 g/dL — CL (ref 12.0–15.0)
Immature Granulocytes: 1 %
Lymphocytes Relative: 10 %
Lymphs Abs: 0.5 10*3/uL — ABNORMAL LOW (ref 0.7–4.0)
MCH: 27.8 pg (ref 26.0–34.0)
MCHC: 28.1 g/dL — ABNORMAL LOW (ref 30.0–36.0)
MCV: 99.1 fL (ref 80.0–100.0)
Monocytes Absolute: 0.4 10*3/uL (ref 0.1–1.0)
Monocytes Relative: 9 %
Neutro Abs: 3.8 10*3/uL (ref 1.7–7.7)
Neutrophils Relative %: 80 %
Platelets: 17 10*3/uL — CL (ref 150–400)
RBC: 2.12 MIL/uL — ABNORMAL LOW (ref 3.87–5.11)
RDW: 16.5 % — ABNORMAL HIGH (ref 11.5–15.5)
WBC: 4.8 10*3/uL (ref 4.0–10.5)
nRBC: 1.5 % — ABNORMAL HIGH (ref 0.0–0.2)

## 2019-10-06 LAB — HEPATITIS PANEL, ACUTE
HCV Ab: REACTIVE — AB
Hep A IgM: NONREACTIVE
Hep B C IgM: NONREACTIVE
Hepatitis B Surface Ag: NONREACTIVE

## 2019-10-06 LAB — GLUCOSE, CAPILLARY
Glucose-Capillary: 86 mg/dL (ref 70–99)
Glucose-Capillary: 87 mg/dL (ref 70–99)
Glucose-Capillary: 103 mg/dL — ABNORMAL HIGH (ref 70–99)
Glucose-Capillary: 87 mg/dL (ref 70–99)
Glucose-Capillary: 95 mg/dL (ref 70–99)
Glucose-Capillary: 141 mg/dL — ABNORMAL HIGH (ref 70–99)
Glucose-Capillary: 123 mg/dL — ABNORMAL HIGH (ref 70–99)

## 2019-10-06 LAB — PREPARE RBC (CROSSMATCH)

## 2019-10-06 LAB — CBC
HCT: 22.9 % — ABNORMAL LOW (ref 36.0–46.0)
HCT: 24 % — ABNORMAL LOW (ref 36.0–46.0)
Hemoglobin: 7.5 g/dL — ABNORMAL LOW (ref 12.0–15.0)
Hemoglobin: 7.7 g/dL — ABNORMAL LOW (ref 12.0–15.0)
MCH: 29.1 pg (ref 26.0–34.0)
MCH: 28.5 pg (ref 26.0–34.0)
MCHC: 32.8 g/dL (ref 30.0–36.0)
MCHC: 32.1 g/dL (ref 30.0–36.0)
MCV: 88.8 fL (ref 80.0–100.0)
MCV: 88.9 fL (ref 80.0–100.0)
Platelets: 34 10*3/uL — ABNORMAL LOW (ref 150–400)
Platelets: 33 10*3/uL — ABNORMAL LOW (ref 150–400)
RBC: 2.58 MIL/uL — ABNORMAL LOW (ref 3.87–5.11)
RBC: 2.7 MIL/uL — ABNORMAL LOW (ref 3.87–5.11)
RDW: 16.2 % — ABNORMAL HIGH (ref 11.5–15.5)
RDW: 17 % — ABNORMAL HIGH (ref 11.5–15.5)
WBC: 3.4 10*3/uL — ABNORMAL LOW (ref 4.0–10.5)
WBC: 3.1 10*3/uL — ABNORMAL LOW (ref 4.0–10.5)
nRBC: 4.1 % — ABNORMAL HIGH (ref 0.0–0.2)
nRBC: 7.4 % — ABNORMAL HIGH (ref 0.0–0.2)

## 2019-10-06 LAB — BODY FLUID CELL COUNT WITH DIFFERENTIAL
Eos, Fluid: 0 %
Lymphs, Fluid: 48 %
Monocyte-Macrophage-Serous Fluid: 15 % — ABNORMAL LOW (ref 50–90)
Neutrophil Count, Fluid: 37 % — ABNORMAL HIGH (ref 0–25)
Total Nucleated Cell Count, Fluid: 400 cu mm (ref 0–1000)

## 2019-10-06 LAB — URINE CULTURE

## 2019-10-06 LAB — LIPASE, BLOOD: Lipase: 49 U/L (ref 11–51)

## 2019-10-06 LAB — MAGNESIUM: Magnesium: 2 mg/dL (ref 1.7–2.4)

## 2019-10-06 LAB — BASIC METABOLIC PANEL
Anion gap: 18 — ABNORMAL HIGH (ref 5–15)
BUN: 6 mg/dL (ref 6–20)
CO2: 16 mmol/L — ABNORMAL LOW (ref 22–32)
Calcium: 8.2 mg/dL — ABNORMAL LOW (ref 8.9–10.3)
Chloride: 89 mmol/L — ABNORMAL LOW (ref 98–111)
Creatinine, Ser: 0.57 mg/dL (ref 0.44–1.00)
GFR calc Af Amer: >60 mL/min (ref >60)
GFR calc non Af Amer: >60 mL/min (ref >60)
Glucose, Bld: 94 mg/dL (ref 70–99)
Potassium: 3.4 mmol/L — ABNORMAL LOW (ref 3.5–5.1)
Sodium: 123 mmol/L — ABNORMAL LOW (ref 135–145)

## 2019-10-06 LAB — PROTEIN, PLEURAL OR PERITONEAL FLUID: Total protein, fluid: 3.4 g/dL

## 2019-10-06 LAB — CMV IGM: CMV IgM: 65.1 AU/mL — ABNORMAL HIGH (ref 0.0–29.9)

## 2019-10-06 LAB — PROTIME-INR
INR: 3.5 — ABNORMAL HIGH (ref 0.8–1.2)
Prothrombin Time: 35.3 seconds — ABNORMAL HIGH (ref 11.4–15.2)

## 2019-10-06 LAB — HSV 1 ANTIBODY, IGG: HSV 1 Glycoprotein G Ab, IgG: 35.9 index — ABNORMAL HIGH (ref 0.00–0.90)

## 2019-10-06 LAB — CORTISOL: Cortisol, Plasma: 30.9 ug/dL

## 2019-10-06 LAB — GRAM STAIN

## 2019-10-06 LAB — CK: Total CK: 134 U/L (ref 38–234)

## 2019-10-06 LAB — SALICYLATE LEVEL: Salicylate Lvl: 12.5 mg/dL (ref 7.0–30.0)

## 2019-10-06 LAB — EPSTEIN-BARR VIRUS (EBV) ANTIBODY PROFILE
EBV NA IgG: 44 U/mL — ABNORMAL HIGH (ref 0.0–17.9)
EBV VCA IgG: 468 U/mL — ABNORMAL HIGH (ref 0.0–17.9)
EBV VCA IgM: <36 U/mL (ref 0.0–35.9)

## 2019-10-06 LAB — ACETAMINOPHEN LEVEL: Acetaminophen (Tylenol), Serum: <10 ug/mL — ABNORMAL LOW (ref 10–30)

## 2019-10-06 LAB — LACTATE DEHYDROGENASE: LDH: 1044 U/L — ABNORMAL HIGH (ref 98–192)

## 2019-10-06 LAB — LACTATE DEHYDROGENASE, PLEURAL OR PERITONEAL FLUID: LD, Fluid: 217 U/L — ABNORMAL HIGH (ref 3–23)

## 2019-10-06 LAB — MRSA PCR SCREENING: MRSA by PCR: POSITIVE — AB

## 2019-10-06 LAB — AMYLASE: Amylase: 79 U/L (ref 28–100)

## 2019-10-06 LAB — TROPONIN I (HIGH SENSITIVITY): Troponin I (High Sensitivity): 9 ng/L (ref <18)

## 2019-10-06 LAB — PROCALCITONIN: Procalcitonin: 1.24 ng/mL

## 2019-10-06 LAB — TSH: TSH: 12.13 u[IU]/mL — ABNORMAL HIGH (ref 0.350–4.500)

## 2019-10-06 LAB — SODIUM: Sodium: 123 mmol/L — ABNORMAL LOW (ref 135–145)

## 2019-10-06 LAB — PHOSPHORUS: Phosphorus: 1.1 mg/dL — ABNORMAL LOW (ref 2.5–4.6)

## 2019-10-06 LAB — VITAMIN B12: Vitamin B-12: 6284 pg/mL — ABNORMAL HIGH (ref 180–914)

## 2019-10-06 LAB — HIV ANTIBODY (ROUTINE TESTING W REFLEX): HIV Screen 4th Generation wRfx: NONREACTIVE

## 2019-10-06 LAB — CMV ANTIBODY, IGG (EIA): CMV Ab - IgG: >10 U/mL — ABNORMAL HIGH (ref 0.00–0.59)

## 2019-10-06 LAB — T4, FREE: Free T4: 0.49 ng/dL — ABNORMAL LOW (ref 0.61–1.12)

## 2019-10-06 MED ORDER — POTASSIUM CHLORIDE 10 MEQ/100ML IV SOLN
10.0000 meq | INTRAVENOUS | Status: AC
  Administered 2019-10-06 (×4): 10 meq via INTRAVENOUS
  Filled 2019-10-06 (×4): qty 100

## 2019-10-06 MED ORDER — LACTULOSE 10 GM/15ML PO SOLN
30.0000 g | Freq: Four times a day (QID) | ORAL | Status: DC
  Administered 2019-10-06 – 2019-10-07 (×2): 30 g
  Filled 2019-10-06 (×2): qty 45

## 2019-10-06 MED ORDER — PRO-STAT SUGAR FREE PO LIQD
30.0000 mL | Freq: Every day | ORAL | Status: DC
  Administered 2019-10-07 – 2019-10-10 (×4): 30 mL
  Filled 2019-10-06 (×4): qty 30

## 2019-10-06 MED ORDER — LIDOCAINE HCL 1 % IJ SOLN
INTRAMUSCULAR | Status: AC
  Filled 2019-10-06: qty 20

## 2019-10-06 MED ORDER — VITAMIN K1 10 MG/ML IJ SOLN
5.0000 mg | Freq: Every day | INTRAVENOUS | Status: AC
  Administered 2019-10-06 – 2019-10-08 (×3): 5 mg via INTRAVENOUS
  Filled 2019-10-06 (×4): qty 0.5

## 2019-10-06 MED ORDER — POTASSIUM PHOSPHATES 15 MMOLE/5ML IV SOLN: 10.0000 mmol | Freq: Once | INTRAVENOUS | Status: DC

## 2019-10-06 MED ORDER — LORAZEPAM 2 MG/ML IJ SOLN: 1.0000 mg | INTRAMUSCULAR | Status: DC | PRN

## 2019-10-06 MED ORDER — THIAMINE HCL 100 MG/ML IJ SOLN
100.0000 mg | Freq: Every day | INTRAMUSCULAR | Status: DC
  Administered 2019-10-06 – 2019-10-14 (×9): 100 mg via INTRAVENOUS
  Filled 2019-10-06 (×10): qty 2

## 2019-10-06 MED ORDER — VITAL 1.5 CAL PO LIQD
1000.0000 mL | ORAL | Status: DC
  Administered 2019-10-06 – 2019-10-10 (×4): 1000 mL
  Filled 2019-10-06 (×7): qty 1000

## 2019-10-06 MED ORDER — FOLIC ACID 5 MG/ML IJ SOLN
1.0000 mg | Freq: Every day | INTRAMUSCULAR | Status: DC
  Administered 2019-10-06 – 2019-10-14 (×9): 1 mg via INTRAVENOUS
  Filled 2019-10-06 (×11): qty 0.2

## 2019-10-06 MED ORDER — PRO-STAT SUGAR FREE PO LIQD
30.0000 mL | Freq: Two times a day (BID) | ORAL | Status: DC
  Administered 2019-10-06: 13:00:00 30 mL
  Filled 2019-10-06: qty 30

## 2019-10-06 MED ORDER — PANTOPRAZOLE SODIUM 40 MG IV SOLR
40.0000 mg | Freq: Two times a day (BID) | INTRAVENOUS | Status: DC
  Administered 2019-10-06 – 2019-10-11 (×13): 40 mg via INTRAVENOUS
  Filled 2019-10-06 (×13): qty 40

## 2019-10-06 MED ORDER — POTASSIUM CHLORIDE 10 MEQ/100ML IV SOLN
10.0000 meq | INTRAVENOUS | Status: AC
  Administered 2019-10-06 (×6): 10 meq via INTRAVENOUS
  Filled 2019-10-06 (×6): qty 100

## 2019-10-06 MED ORDER — MIDAZOLAM HCL 2 MG/2ML IJ SOLN
1.0000 mg | INTRAMUSCULAR | Status: DC | PRN
  Administered 2019-10-08 – 2019-10-11 (×5): 2 mg via INTRAVENOUS
  Filled 2019-10-06 (×6): qty 2

## 2019-10-06 MED ORDER — SODIUM CHLORIDE 0.9% IV SOLUTION: Freq: Once | INTRAVENOUS | Status: AC

## 2019-10-06 MED ORDER — SODIUM CHLORIDE 0.9 % IV SOLN
INTRAVENOUS | Status: DC | PRN
  Administered 2019-10-06: 02:00:00 250 mL via INTRAVENOUS

## 2019-10-06 MED ORDER — VITAL HIGH PROTEIN PO LIQD
1000.0000 mL | ORAL | Status: DC
  Administered 2019-10-06: 13:00:00 1000 mL

## 2019-10-06 MED ORDER — DEXMEDETOMIDINE HCL IN NACL 400 MCG/100ML IV SOLN: 0.2000 ug/kg/h | INTRAVENOUS | Status: DC

## 2019-10-06 MED ORDER — SODIUM PHOSPHATES 45 MMOLE/15ML IV SOLN
30.0000 mmol | Freq: Once | INTRAVENOUS | Status: AC
  Administered 2019-10-06: 10:00:00 30 mmol via INTRAVENOUS
  Filled 2019-10-06: qty 10

## 2019-10-06 MED ORDER — LEVOTHYROXINE SODIUM 100 MCG/5ML IV SOLN
50.0000 ug | Freq: Every day | INTRAVENOUS | Status: DC
  Administered 2019-10-06 – 2019-10-15 (×10): 50 ug via INTRAVENOUS
  Filled 2019-10-06 (×10): qty 5

## 2019-10-06 MED ORDER — PIPERACILLIN-TAZOBACTAM 3.375 G IVPB 30 MIN
3.3750 g | INTRAVENOUS | Status: AC
  Administered 2019-10-06: 02:00:00 3.375 g via INTRAVENOUS
  Filled 2019-10-06: qty 50

## 2019-10-06 MED ORDER — SODIUM CHLORIDE 0.9 % IV SOLN: INTRAVENOUS | Status: DC | PRN

## 2019-10-06 MED ORDER — LEVETIRACETAM IN NACL 500 MG/100ML IV SOLN
500.0000 mg | Freq: Two times a day (BID) | INTRAVENOUS | Status: DC
  Administered 2019-10-06 – 2019-10-16 (×22): 500 mg via INTRAVENOUS
  Filled 2019-10-06 (×25): qty 100

## 2019-10-06 MED ORDER — PIPERACILLIN-TAZOBACTAM 3.375 G IVPB
3.3750 g | Freq: Three times a day (TID) | INTRAVENOUS | Status: DC
  Administered 2019-10-06 – 2019-10-12 (×18): 3.375 g via INTRAVENOUS
  Filled 2019-10-06 (×19): qty 50

## 2019-10-06 MED ORDER — DEXTROSE 10 % IV SOLN: INTRAVENOUS | Status: DC

## 2019-10-06 MED ORDER — RIFAXIMIN 550 MG PO TABS
550.0000 mg | ORAL_TABLET | Freq: Two times a day (BID) | ORAL | Status: DC
  Administered 2019-10-06 – 2019-10-14 (×18): 550 mg
  Filled 2019-10-06 (×20): qty 1

## 2019-10-06 MED ORDER — LIDOCAINE HCL 1 % IJ SOLN
INTRAMUSCULAR | Status: DC | PRN
  Administered 2019-10-06: 5 mL

## 2019-10-06 MED ORDER — LACTULOSE ENEMA
300.0000 mL | Freq: Two times a day (BID) | ORAL | Status: DC
  Administered 2019-10-06 (×2): 300 mL via RECTAL
  Filled 2019-10-06 (×3): qty 300

## 2019-10-06 MED ORDER — PIPERACILLIN-TAZOBACTAM 3.375 G IVPB
3.3750 g | Freq: Three times a day (TID) | INTRAVENOUS | Status: DC
  Filled 2019-10-06: qty 50

## 2019-10-06 MED ORDER — PREDNISOLONE SODIUM PHOSPHATE 15 MG/5ML PO SOLN
40.0000 mg | Freq: Every day | ORAL | Status: DC
  Administered 2019-10-07 – 2019-10-13 (×7): 40 mg
  Filled 2019-10-06 (×7): qty 15

## 2019-10-06 MED ORDER — RIFAXIMIN 550 MG PO TABS: 550.0000 mg | ORAL_TABLET | Freq: Two times a day (BID) | ORAL | Status: DC

## 2019-10-06 NOTE — Progress Notes (Signed)
Pharmacy Antibiotic Note  Dawn Foley is a 34 y.o. female admitted on 10/05/2019 with intra-abd infection.  Pharmacy has been consulted for Zosyn dosing.  Pt transferred from [place]- spoke briefly with pharmacist there and pt did not receive any abx over there. SCr 0.5.  Plan: Zosyn 3.375gm IV every 8 hours. Follow-up renal function, micro data, labs, vitals.   Height: 5\' 3"  (160 cm) Weight: 187 lb 13.3 oz (85.2 kg) IBW/kg (Calculated) : 52.4  Temp (24hrs), Avg:99.4 F (37.4 C), Min:99.3 F (37.4 C), Max:99.5 F (37.5 C)  Recent Labs  Lab 10/06/19 0020  WBC 4.8    CrCl cannot be calculated (Patient's most recent lab result is older than the maximum 21 days allowed.).    Allergies  Allergen Reactions  . Ondansetron Nausea And Vomiting  . Fish Allergy Nausea And Vomiting  . Grapeseed Extract [Nutritional Supplements] Hives    Antimicrobials this admission: 1/26 Zosyn >>   Microbiology results: 1/26 BCx:   UCx:   1/25 MRSA PCR: positive  Thank you for allowing pharmacy to be a part of this patient's care.  Sherlon Handing, PharmD, BCPS Please see amion for complete clinical pharmacist phone list 10/06/2019 1:15 AM

## 2019-10-06 NOTE — Progress Notes (Signed)
CRITICAL VALUE ALERT  Critical Value:  Hgb 5.9, plt 17  Date & Time Notied:  10/06/19 @ 0041  Provider Notified: Dr Duwayne Heck  Orders Received/Actions taken: MD made aware

## 2019-10-06 NOTE — Consult Note (Signed)
[facility] Consult: 9:55 AM 10/06/2019  LOS: 1 day    Referring Provider: Dr Halford Chessman  Primary Care Physician:  System, Pcp Not In Primary Gastroenterologist:  Unassigned.  In 2017 stated her GI MD was Dr. Lovie Macadamia at [facility]   Reason for Consultation: Acute liver failure, alcoholic hepatitis.   HPI: Dawn Foley is a 34 y.o. female.  Chronic alcoholic with recurrent alcoholic hepatitis, fatty liver, hepatitis C not known to have been treated.  She admits to drinking 1/2 gallon of liquor daily.  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  DTs.  Right hemicolectomy for colon cancer 2009.  MW tear.  Hypothyroidism.  Thrombocytopenia.  Blood loss and iron deficiency anemia.  Borderline personality, schizoaffective disorder. 2015 colonoscopy at [facility], Dr. Romeo Apple.  Poor prep. "evidence of a prior functional end-to-end ileo-colonic anastomosis in the transverse colon. This was patent. This was characterized by healthy appearing mucosa".   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  Transfer from [facility] where she presented with several days of progressive jaundice and encephalopathy. Work-up there remarkable for Hb 3.6 (was 12.5 in 04/2019).  MCV 101.  Platelets 23K.  INR 4.  Ammonia 133. T bili 19.7 >> 25, alk phos 194 >> 120, AST/ALT 1528/119 >> 1588/148.  Sodium 121 >> 124.Marland Kitchen Discriminant function score 124. Hypoglycemia required treatment with D50, D5W drip. D-dimer 3352.  Leukocytes and bacteria in her urine. Undetectable EtOH level. COVID-19 negative. CTAP shows severe hepatic steatosis, hepatomegaly, moderate to large possibly complex free fluid in the pelvis extending into the right abdomen.  Abdominal ultrasound shows gallbladder sludge but no evidence for  cholecystitis, no biliary dilatation.  Enlarged, fatty liver. CT head shows nothing acute, small bil mastoid effusions.  Social history.  Patient lives in [place].  He she has children who are cared for by her mother.  Reports are that she drinks 1/2 gallon of liquor daily.  Family history: Brother with alcoholism. Mental illness and thyroid disease in "other" relatives.    Past Medical History:  Diagnosis Date  . Alcoholism ([place])   . Anemia   . Anxiety   . Arthritis   . Asthma    as a child  . Bipolar disorder ([place])   . Cancer ([place])    colon  . Chronic kidney disease   . Complication of anesthesia    woke up during colonoscopy  . Depression   . Elevated liver enzymes   . GERD (gastroesophageal reflux disease)   . H/O alcohol abuse    clean for 1 month as of 07/17/17  . Heart murmur    per [facility] per PT  . Hepatitis    denies  . Hepatitis C   . Hypothyroidism   . Mallory-Weiss tear   . Nicotine dependence   . Schizophrenia ([name])   . Seizures ([place])    seizures - most recent 05/2017, supposed to be on Tegretol but can't afford  . Thrombocytopenia ([facility])    Overview:  alcoholism  . Tibial plateau fracture, right   .  Upper GI bleed     Past Surgical History:  Procedure Laterality Date  . APPENDECTOMY    . DILATION AND CURETTAGE OF UTERUS    . ESOPHAGOGASTRODUODENOSCOPY (EGD) WITH PROPOFOL N/A 09/04/2016   Procedure: ESOPHAGOGASTRODUODENOSCOPY (EGD) WITH PROPOFOL;  Surgeon: Doran Stabler, MD;  Location: [place];  Service: Endoscopy;  Laterality: N/A;  . HARDWARE REMOVAL Right 06/18/2017   Procedure: REMOVAL RIGHT EXTERNAL FIXATOR;  Surgeon: Altamese Kerr, MD;  Location: [place];  Service: Orthopedics;  Laterality: Right;  . HERNIA REPAIR     umbilical hernia  . KNEE CLOSED REDUCTION Right 06/18/2017   Procedure: CLOSED MANIPULATION UNDER ANESTHESIA RIGHT KNEE;  Surgeon: Altamese [place], MD;  Location: [place];  Service: Orthopedics;  Laterality: Right;   . ORIF FEMUR FRACTURE Right 07/18/2017   Procedure: REPAIR NONUNION WITH RIA;  Surgeon: Altamese [place], MD;  Location: [place];  Service: Orthopedics;  Laterality: Right;  . ORIF TIBIA PLATEAU      Prior to Admission medications   Medication Sig Start Date End Date Taking? Authorizing Provider  haloperidol (HALDOL) 10 MG tablet Take 10 mg by mouth 3 (three) times daily.    [provider]  levETIRAcetam (KEPPRA) 500 MG tablet Take 1 tablet (500 mg total) by mouth 2 (two) times daily. Patient not taking: Reported on 10/06/2019 10/27/18   Johnn Hai, MD  levothyroxine (SYNTHROID, LEVOTHROID) 100 MCG tablet Take 1 tablet (100 mcg total) by mouth daily at 6 (six) AM. Patient not taking: Reported on 10/06/2019 09/02/18   Clapacs, Madie Reno, MD  QUEtiapine (SEROQUEL) 300 MG tablet Take 300 mg by mouth 3 (three) times daily.    [provider]  traZODone (DESYREL) 50 MG tablet Take 50 mg by mouth at bedtime. 10/17/18   [provider]    Scheduled Meds: . chlorhexidine  15 mL Mouth Rinse BID  . Chlorhexidine Gluconate Cloth  6 each Topical Q0600  . folic acid  1 mg Intravenous Daily  . lactulose  300 mL Rectal BID  . levothyroxine  50 mcg Intravenous Q0600  . mouth rinse  15 mL Mouth Rinse q12n4p  . pantoprazole (PROTONIX) IV  40 mg Intravenous Q12H  . [START ON 10/07/2019] prednisoLONE  40 mg Per Tube QAC breakfast  . rifaximin  550 mg Per Tube BID  . thiamine  100 mg Intravenous Daily   Infusions: . sodium chloride 50 mL/hr at 10/06/19 0800  . dexmedetomidine (PRECEDEX) IV infusion    . dextrose 20 mL/hr at 10/06/19 0800  . levETIRAcetam Stopped (10/06/19 0203)  . piperacillin-tazobactam (ZOSYN)  IV    . sodium phosphate  Dextrose 5% IVPB     PRN Meds: sodium chloride, midazolam   Allergies as of 10/05/2019 - Review Complete 10/05/2019  Allergen Reaction Noted  . Ondansetron Nausea And Vomiting 08/06/2016  . Fish allergy Nausea And Vomiting 08/28/2018  .  Grapeseed extract [nutritional supplements] Hives 11/05/2017    Family History  Problem Relation Age of Onset  . Mental illness Other   . Thyroid disease Other   . Alcoholism Brother     Social History   Socioeconomic History  . Marital status: Single    Spouse name: Not on file  . Number of children: Not on file  . Years of education: Not on file  . Highest education level: Not on file  Occupational History  . Not on file  Tobacco Use  . Smoking status: Current Every Day Smoker    Packs/day: 1.00  Types: Cigarettes  . Smokeless tobacco: Never Used  Substance and Sexual Activity  . Alcohol use: Yes    Alcohol/week: 16.0 standard drinks    Types: 16 Standard drinks or equivalent per week    Comment: 1/2 gallon liquor daily  . Drug use: No  . Sexual activity: Not Currently    Birth control/protection: None  Other Topics Concern  . Not on file  Social History Narrative  . Not on file   Social Determinants of Health   Financial Resource Strain:   . Difficulty of Paying Living Expenses: Not on file  Food Insecurity:   . Worried About Charity fundraiser in the Last Year: Not on file  . Ran Out of Food in the Last Year: Not on file  Transportation Needs:   . Lack of Transportation (Medical): Not on file  . Lack of Transportation (Non-Medical): Not on file  Physical Activity:   . Days of Exercise per Week: Not on file  . Minutes of Exercise per Session: Not on file  Stress:   . Feeling of Stress : Not on file  Social Connections:   . Frequency of Communication with Friends and Family: Not on file  . Frequency of Social Gatherings with Friends and Family: Not on file  . Attends Religious Services: Not on file  . Active Member of Clubs or Organizations: Not on file  . Attends Archivist Meetings: Not on file  . Marital Status: Not on file  Intimate Partner Violence:   . Fear of Current or Ex-Partner: Not on file  . Emotionally Abused: Not on file  .  Physically Abused: Not on file  . Sexually Abused: Not on file    REVIEW OF SYSTEMS: Constitutional: Weakness. ENT:  No nose bleeds Pulm: No dyspnea, no cough.  No pleuritic pain. CV:  No palpitations, no LE edema.  No angina. GU:  No hematuria, no frequency.  Urine is dark. GI: See HPI. Heme: No history unusual or excessive bleeding or bruising. Transfusions: See HPI. Neuro:  No headaches, no peripheral tingling or numbness.  No recent seizures reported. Derm: Jaundice.  No itching, no rash or sores.  Endocrine:  No sweats or chills.  No polyuria or dysuria Immunization: Reviewed, had flu shot and Pneumovax in 11/2018 during inpatient hospitalization Travel:  None beyond local counties in last few months.    PHYSICAL EXAM: Vital signs in last 24 hours: Vitals:   10/06/19 0900 10/06/19 0945  BP: 109/69   Pulse: (!) 120 (!) 123  Resp: 17 (!) 24  Temp: 99 F (37.2 C) 99.3 F (37.4 C)  SpO2: 99% 98%   Wt Readings from Last 3 Encounters:  10/05/19 85.2 kg  05/11/19 72.6 kg  07/18/17 77.1 kg    General: Jaundiced, alert, mumbled speech. Head: Edematous facies.  No signs of head trauma.  No asymmetry. Eyes: Icteric sclera.  No conjunctival pallor.  EOMI. Ears: Not hard of hearing Nose: No discharge or congestion Mouth: Peeling, erythematous lips with scabs at the corners.  Moist, clear, pink oropharynx.  Tongue midline. Neck: No JVD, no masses, no thyromegaly. Lungs: Clear bilaterally.  No labored breathing or cough Heart: On telemetry tachycardia to the 120s, regular.  No MRG. Abdomen: Soft.  Nondistended.  No obvious fluid wave.  Liver edge palpable several centimeters below costal margin.  No tenderness.  No hernias..   Rectal: Deferred Musc/Skeltl: No joint redness or gross deformities. Extremities: Nonpitting lower extremity edema from the knees  down to the feet.  Pressure dressings placed on heels bilaterally.  Long medial scar on the right lower leg Neurologic:  Tremors.  Asterixis.  Mumbled speech.  Does follow simple commands. Skin: Jaundiced.  Telangiectasia across the upper chest. Nodes: No cervical adenopathy. Psych: Calm.  Intake/Output from previous day: 01/25 0701 - 01/26 0700 In: 1204.5 [I.V.:124.6; Blood:630; IV Piggyback:449.9] Out: 525 [Urine:525] Intake/Output this shift: Total I/O In: 639.4 [I.V.:139.9; Blood:382; IV Piggyback:117.4] Out: -   LAB RESULTS: Recent Labs    10/06/19 0020  WBC 4.8  HGB 5.9*  HCT 21.0*  PLT 17*   BMET Lab Results  Component Value Date   NA 124 (L) 10/06/2019   NA 123 (L) 10/06/2019   NA 120 (L) 10/06/2019   K 3.4 (L) 10/06/2019   K 3.1 (L) 10/06/2019   K 3.7 05/11/2019   CL 89 (L) 10/06/2019   CL 85 (L) 10/06/2019   CL 103 05/11/2019   CO2 21 (L) 10/06/2019   CO2 12 (L) 10/06/2019   CO2 19 (L) 05/11/2019   GLUCOSE 95 10/06/2019   GLUCOSE 98 10/06/2019   GLUCOSE 74 05/11/2019   BUN 5 (L) 10/06/2019   BUN 5 (L) 10/06/2019   BUN 5 (L) 05/11/2019   CREATININE 0.46 10/06/2019   CREATININE 0.72 10/06/2019   CREATININE 0.52 05/11/2019   CALCIUM 8.0 (L) 10/06/2019   CALCIUM 8.2 (L) 10/06/2019   CALCIUM 9.2 05/11/2019   LFT Recent Labs    10/06/19 0020 10/06/19 0851  PROT 7.1  --   ALBUMIN 2.1* 2.1*  AST 1,663*  --   ALT 184*  --   ALKPHOS 132*  --   BILITOT 23.7*  --    PT/INR Lab Results  Component Value Date   INR 3.5 (H) 10/06/2019   INR 0.95 07/18/2017   INR 1.09 09/04/2016   Hepatitis Panel No results for input(s): HEPBSAG, HCVAB, HEPAIGM, HEPBIGM in the last 72 hours. C-Diff No components found for: CDIFF Lipase     Component Value Date/Time   LIPASE 49 10/06/2019 0020    Drugs of Abuse     Component Value Date/Time   LABOPIA NONE DETECTED 05/11/2019 2224   COCAINSCRNUR NONE DETECTED 05/11/2019 2224   COCAINSCRNUR NONE DETECTED 12/13/2017 1528   LABBENZ POSITIVE (A) 05/11/2019 2224   AMPHETMU NONE DETECTED 05/11/2019 2224   THCU NONE DETECTED  05/11/2019 2224   LABBARB NONE DETECTED 05/11/2019 2224     RADIOLOGY STUDIES: No results found.    IMPRESSION:   *  Acute liver failure, alcoholic hepatitis, hepatitis C positive. Several previous incidences of acute alcoholic hepatitis. Disc fx score 124.   Prednisolone 40 mg VT ordered, first dose pending.  *   Acute on chronic thrombocytopenia.  *    Coagulopathy.  So far has received 1 unit platelets.  *     Hepatic encephalopathy.  Bid Lactulose enemas initiated early this morning,  twice daily Xifaxan  *    Hyponatremia.  *    Anemia, microcytic. So far has received PRBC x2  *    Pelvic ascites.  Pelvic ultrasound ordered.  If this is tappable, go ahead and perform paracentesis and send fluid studies for cell count, differential, albumin, cytology  *    COVID-19 negative.  *     Leukosuria, few bacteria, moderate leukocytes, no nitrites.  Urine culture pending. q 8 hours Zosyn in place which will also provide empiric SBP coverage.  *    Mallory-Weiss tear with  coffee-ground emesis on 08/2016 EGD.  *    Hypokalemia.  Has received potassium phosphate.    PLAN:     *   Continue supportive care.  Once core track tube is placed, begin oral lactulose, the rifaximin and the prednisolone. Add 3 d of IV Vit K.   Daily pt/inr, cmet, cbc, ammonia.      *  If pelvic ascites tapable, proceed to paracentesis and send fluid studies for cell count, differential, albumin, cytology.    *   Patient is not a transplant candidate given active alcoholism.  *    Prognosis is guarded.  *    Pending labs include HCV RNA quantitative.  Azucena Freed  10/06/2019, 9:55 AM Phone [phone]

## 2019-10-06 NOTE — H&P (Addendum)
NAME:  Dawn Foley, MRN:  [id number], DOB:  October 06, 1985, LOS: 1 ADMISSION DATE:  10/05/2019, CONSULTATION DATE:  10/05/2019 REFERRING MD:  Oval Linsey [facility], CHIEF COMPLAINT:  Encephalopathy   Brief History   34 year old female with hx of chronic ETOH abuse presenting from home with 4 day hx of acute encephalopathy and progressive jaundice with workup concerning for acute liver failure with multiple metabolic derangements and coagulopathies transferred to [facility] for higher level of care.    History of present illness   HPI obtained from medical chart review as patient is encephalopathic.   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  Found by EMS to be hypoglycemic and hypoxic in the 80's on room air treated with NRB and glucagon.  Unclear when her last drink was but on chart review in the fall, patient was drinking half gallon of liquor daily.   In ER, afebrile with soft SBP in the 80's.  Her workup was notable for H/H 3.6/ 13.1 (prior 12.3/ 38 in 05/2019), MCV 101, platelet 23 (prior 121 in 05/2019), INR 4, PT 38.6, fibrinogen 277, ammonia 133, t bili 19.7, AST 1528, ALT 119, ALP 194, Na 121, albumin 2.9, d-dimer 3352, UA +leukocytes and bacteria, UDS negative, ETOH undetectable.  SARS2 was negative. ABG 7.410/ <20/ 234, urine preg neg, hemoccult negative.  Persistently hypoglycemia treated with thiamine and D50 requiring D5W gtt, CXR unremarkable, CT a/p showing severe hepatic steatosis and hepatomegaly and moderate to large amount of free fluid within the pelvis extending into the right abd which may be complex/ complicated fluid.  She has been  protecting her mental status but remains encephalopathic.  Patient transferred to [facility] ICU for higher level of care and monitoring, PCCM to admit.    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 Severe hepatic steatosis and hepatomegaly 2. Moderate to large amount of free fluid within the pelvis extending into the right abdomen which may be mildly complicated/ complex  99991111 RUQ [place] >> 1. Gallbladder sludge.  No sonographic evidence for acute cholecystitis or bilary dilation.  2. Enlarged echogenic liver suggesting fatty inflitration  Micro Data:  1/26 BC x2 >> 1/26 UC >>  Antimicrobials:  1/26 ceftriaxone >>  Interim history/subjective:  Arrived - s/p 1 unit PRBC and finishing 1 unit of FFP on D5W gtt 125 ml/hr  Remains hemodynamically stable and protecting airway  Objective   Blood pressure (!) 111/57, pulse (!) 121, temperature 99.3 F (37.4 C), temperature source Bladder, resp. rate (!) 24, height 5\' 3"  (1.6 m), weight 85.2 kg, SpO2 100 %.       No intake or output data in the 24 hours ending 10/06/19 0005 Filed Weights   10/05/19 2335  Weight: 85.2 kg    Examination: General:  Ill  appearing young WF in NAD HEENT: MM pale/ dry, dry cracked lips, pupils 4/reactive, icterus  Neuro: will awaken to voice, intermittently follow commands/ oriented to name, MAE weakly CV: rr, ST, no murmur PULM:  Non labored, CTA, on 2L [place], strong cough GI: protuberant, prominent hepatomegaly, soft, hypoBS, moans on palpation Extremities:  warm/dry, +2-3 generalized edema  Skin: jaundiced  Resolved Hospital Problem list     Assessment & Plan:   Acute liver failure  Hx Hep C (do not see that she has been treated) Chronic heavy ETOH abuse - MELD score 33- with 3 month 52.6% mortality rate P:  Consult GI in am Consider transfer to transplant center- doubtful given her ongoing ETOH abuse  Trend CMP/ coags/ ammonia - daily  Lactulose rectal BID  Discriminant fx score 141, consider prednisolone, will defer to GI Checking additional labs: acetaminophen, HIV, cortisol, acute hepatitis panel, CMV, EBV, HSV, varicella, CK   Encephalopathy- hepatic and metabolic  High risk for DTs, unclear last ETOH use (ETOH neg 1/25) Hx alcohol related seizures P:  NPO She is protecting her airway currently, remains high risk for airway compromise Serial neuro exams Lactulose as above ICU CIWA, precedex if needed Ativan prn seizure Seizure precautions Keppra BID IV  Daily thiamine/ folate    Coagulopathies - severe thrombocytopenia, anemia, INR 4 - likely related to liver failure +/- rule out bacterial component/ DIC - likely chronic component of anemia given her hemodynamic stability with a Hgb of 3.6 - OSH labs noted for B12 > 1000, folate and TIBC normal, iron low 19 with low saturation of 6.2%, ferritin 326 - hemoccult negative P:  Check DIC panel Type and screen now transfuse for Hgb >7 2 PRBC, 1 6pk platelets ordered Post transfusion labs/ H/H q 6  PPI BID Sepsis workup as below Check BLE DVT studies    High risk for airway compromise related to encephalopathy  P:  Monitor  Supplemental O2 prn  NPO   Right sided Abdominal complex/ complicated fluid collection UTI  P:  Pan-culture  Empiric zosyn for now, de-escalate as able  Check/ trend PCT  Consider IR/ radiology consult in am for further input on right sided complicated/ complex fluid collection. 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  Trend WBC/ fever curve   Hyponatremia At risk for AKI P:  Strict q 4 Na- avoid rapid rise to avoid cerebral edema, consider hypertonic therapy if she develops seizures  Continue foley/ strict I/Os Check phos/ mag Trend BMP Goal MAP > 65 Replace electrolytes as indicated Avoid nephrotoxic agents, ensure adequate renal perfusion   Hypoglycemia - likely related to liver dysfunction P:  D/c D5W given hyponatremia Start D10 at 20 ml/hr, check hourly glucose or as needed till stable and titrate Will need nutrition at some point but remains NPO and avoid feeding tube till coagulopathies stabilized    Hx depression, polysubstance abuse, schizophrenia P:  Holding home haldol, seroquel, trazadone  Hypothyroidism P:  Check TSH   Best practice:  Diet: NPO Pain/Anxiety/Delirium protocol (if indicated): CIWA ICU VAP protocol (if indicated): n/a DVT prophylaxis: hold for now GI prophylaxis: PPI BID Glucose control: D10 gtt Mobility: BR Code Status: Full  Family Communication: mother updated by phone by Dr. Duwayne Heck Disposition: ICU  Labs   CBC: No results for input(s): WBC, NEUTROABS, HGB, HCT, MCV, PLT in the last 168 hours.  Basic Metabolic Panel: No results for input(s): NA, K, CL, CO2, GLUCOSE, BUN, CREATININE, CALCIUM,  MG, PHOS in the last 168 hours. GFR: CrCl cannot be calculated (Patient's most recent lab result is older than the maximum 21 days allowed.). No results for input(s): PROCALCITON, WBC, LATICACIDVEN in the last 168 hours.  Liver Function Tests: No results for input(s): AST, ALT, ALKPHOS, BILITOT, PROT, ALBUMIN in the last 168 hours. No results for input(s): LIPASE, AMYLASE in the last 168 hours. No results for input(s): AMMONIA in the last 168 hours.  ABG    Component Value Date/Time   PHART 7.448 09/04/2016 0329   PCO2ART 43.8 09/04/2016 0329   PO2ART 75.8 (L) 09/04/2016 0329   HCO3 29.9 (H) 09/04/2016 0329   O2SAT 94.4  09/04/2016 0329     Coagulation Profile: No results for input(s): INR, PROTIME in the last 168 hours.  Cardiac Enzymes: No results for input(s): CKTOTAL, CKMB, CKMBINDEX, TROPONINI in the last 168 hours.  HbA1C: Hgb A1c MFr Bld  Date/Time Value Ref Range Status  08/28/2018 08:54 AM 4.9 4.8 - 5.6 % Final    Comment:    (NOTE) Pre diabetes:          5.7%-6.4% Diabetes:              >6.4% Glycemic control for   <7.0% adults with diabetes   11/06/2017 06:38 AM 4.7 (L) 4.8 - 5.6 % Final    Comment:    (NOTE) Pre diabetes:          5.7%-6.4% Diabetes:              >6.4% Glycemic control for   <7.0% adults with diabetes     CBG: Recent Labs  Lab 10/05/19 2342  GLUCAP 97    Review of Systems:   unable  Past Medical History  She,  has a past medical history of Alcoholism ([place]), Anemia, Anxiety, Arthritis, Asthma, Bipolar disorder ([place]), Cancer ([place]), Chronic kidney disease, Complication of anesthesia, Depression, Elevated liver enzymes, GERD (gastroesophageal reflux disease), H/O alcohol abuse, Heart murmur, Hepatitis, Hepatitis C, Hypothyroidism, Mallory-Weiss tear, Nicotine dependence, Schizophrenia ([place]), Seizures ([place]), Thrombocytopenia ([place]), Tibial plateau fracture, right, and Upper GI bleed.   Surgical History    Past Surgical History:  Procedure Laterality Date  . APPENDECTOMY    . DILATION AND CURETTAGE OF UTERUS    . ESOPHAGOGASTRODUODENOSCOPY (EGD) WITH PROPOFOL N/A 09/04/2016   Procedure: ESOPHAGOGASTRODUODENOSCOPY (EGD) WITH PROPOFOL;  Surgeon: Doran Stabler, MD;  Location: [place];  Service: Endoscopy;  Laterality: N/A;  . HARDWARE REMOVAL Right 06/18/2017   Procedure: REMOVAL RIGHT EXTERNAL FIXATOR;  Surgeon: Altamese [place], MD;  Location: [place];  Service: Orthopedics;  Laterality: Right;  . HERNIA REPAIR     umbilical hernia  . KNEE CLOSED REDUCTION Right 06/18/2017   Procedure: CLOSED MANIPULATION UNDER ANESTHESIA RIGHT KNEE;  Surgeon: Altamese [place], MD;  Location: [place];  Service: Orthopedics;  Laterality: Right;  . ORIF FEMUR FRACTURE Right 07/18/2017   Procedure: REPAIR NONUNION WITH RIA;  Surgeon: Altamese [place], MD;  Location: [place];  Service: Orthopedics;  Laterality: Right;  . ORIF TIBIA PLATEAU       Social History   reports that she has been smoking cigarettes. She has been smoking about 1.00 pack per day. She has never used smokeless tobacco. She reports current alcohol use of about 16.0 standard drinks of alcohol per week. She reports that she does not use drugs.   Family History   Her family history includes Alcoholism in her brother; Mental illness in an other family member;  Thyroid disease in an other family member.   Allergies Allergies  Allergen Reactions  . Ondansetron Nausea And Vomiting  . Fish Allergy Nausea And Vomiting  . Grapeseed Extract [Nutritional Supplements] Hives     Home Medications  Prior to Admission medications   Medication Sig Start Date End Date Taking? Authorizing Provider  haloperidol (HALDOL) 10 MG tablet Take 10 mg by mouth 3 (three) times daily.    [provider]  levETIRAcetam (KEPPRA) 500 MG tablet Take 1 tablet (500 mg total) by mouth 2 (two) times daily. 10/27/18   Johnn Hai, MD  levothyroxine (SYNTHROID, LEVOTHROID) 100 MCG tablet Take 1 tablet (100 mcg total) by mouth daily at 6 (six) AM. 09/02/18   Clapacs, Madie Reno, MD  naphazoline-glycerin (CLEAR EYES REDNESS) 0.012-0.2 % SOLN Place 1-2 drops into both eyes 4 (four) times daily as needed for eye irritation.    [provider]  nicotine (NICODERM CQ - DOSED IN MG/24 HOURS) 21 mg/24hr patch Place 21 mg onto the skin daily.    [provider]  QUEtiapine (SEROQUEL) 300 MG tablet Take 300 mg by mouth 3 (three) times daily.    [provider]  Skin Protectants, Misc. (EUCERIN) cream Apply 1 application topically as needed for dry skin.    [provider]  traZODone (DESYREL) 50 MG  tablet Take 50 mg by mouth at bedtime. 10/17/18   [provider]     Critical care time: 70 mins      Kennieth Rad, MSN, AGACNP-BC [facility] 10/06/2019, 1:50 AM

## 2019-10-06 NOTE — Progress Notes (Addendum)
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 Workup consistent with acute liver failure with multiple metabolic derangements and coagulopathies transferred to [facility] for higher level of care.     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 Severe hepatic steatosis and hepatomegaly 2. Moderate to large amount of free fluid within the pelvis extending into the right abdomen which may be mildly complicated/ complex  99991111 RUQ [place] >> 1. Gallbladder sludge.  No sonographic evidence for acute cholecystitis or bilary dilation.  2. Enlarged echogenic liver suggesting fatty inflitration  Micro Data:  1/26 BC x2 >> 1/26 UC >>  Antimicrobials:  1/26 zosyn >>  Interim history/subjective:  Answering some yes no questions very slow mentation, difficulty following simple commands  Objective   Blood pressure (!) 146/98, pulse (!) 121,  temperature 99.5 F (37.5 C), temperature source Bladder, resp. rate (!) 22, height 5\' 3"  (1.6 m), weight 85.2 kg, SpO2 98 %.        Intake/Output Summary (Last 24 hours) at 10/06/2019 0741 Last data filed at 10/06/2019 0600 Gross per 24 hour  Intake 1204.52 ml  Output 525 ml  Net 679.52 ml   Filed Weights   10/05/19 2335  Weight: 85.2 kg    Examination: General:  Ill appearing young WF in NAD HEENT: MM pale/ dry, dry cracked lips, pupils 4/reactive, icterus  Neuro: will awaken to voice, intermittently follow commands/ oriented to name CV: tachycardic with regular rhythm, no MRG PULM:   CTA, on 2L [place], strong cough GI: protuberant, prominent hepatomegaly, soft, no rebound tenderness Extremities: warm/dry, +2-3 generalized edema  Skin: jaundiced  Resolved Hospital Problem list     Assessment & Plan:   Acute liver failure   Hx Hep C (do not see that she has been treated), Chronic heavy ETOH abuse, MELD score 33- with 3 month 52.6% mortality rate.  acetaminophen, HIV, cortisol, acute hepatitis panel, CMV, EBV, HSV, varicella, CK, ANA, antimitochondrial ab, anti-smooth muscle antibody.   P:  Consult GI  No fluid collection available for paracentesis Continue empiric zosyn  Follow up Acute liver failure workup listed above Consider transfer to transplant center- doubtful given her ongoing ETOH abuse  Trend CMP/ coags/ ammonia/ - daily  Lactulose rectal BID    Encephalopathy- hepatic and metabolic  High risk for DTs, unclear last ETOH use (ETOH neg 1/25) Hx alcohol related seizures.  Severe anemia, hyponatremia also contributing P:  Serial neuro exams Lactulose as above ICU CIWA, precedex if needed versed prn  Seizure precautions Keppra BID IV  Daily thiamine/ folate IV   Severe Anemia, Thrombocytopenia - severe thrombocytopenia, anemia, INR 4 likely related to liver failure , seems to be mixture of chronic blood loss anemia and bone marrow suppression from alcohol  use.  OSH labs noted for B12 > 1000, folate and TIBC normal, iron low 19 with low saturation of 6.2%, ferritin 326 hemoccult negative.  Has received 4 u PRBC's and 1FFP/1Platelets as of 1/26 am.   P:  Follow up DIC panel transfuse for Hgb >7 Serial hgb checks PPI BID Sepsis workup as below   High risk for airway compromise related to encephalopathy  P:  Monitor  Supplemental O2 prn  NPO   Right sided Pelvic/abdominal complex/ complicated fluid collection UA also suggestive of UTI P:  Follow up cultures Empiric zosyn for now, de-escalate as able  Check/ trend PCT  Consider IR consult in am for further input on right sided complicated/ complex fluid collection. Does have hx of right hemicolectomy in 2009 related to adenocarcinoma.  Trend WBC/ fever curve   Hyponatremia At risk for AKI P:  Strict q 4 RFP- avoid rapid rise to avoid cerebral edema, consider hypertonic therapy if she develops seizures  Continue foley/ strict I/Os Check phos/ mag Trend BMP Goal MAP > 65 Replace electrolytes as indicated Avoid nephrotoxic agents, ensure adequate renal perfusion   Severe malnutrition, Hypoglycemia - likely related to liver dysfunction and alcohol use disorder P:  Continue D10 at 20 ml/hr, check serial glucose  Core trak today in no mental status improvement needs early nutrition    Hx depression, polysubstance abuse, schizophrenia P:  Holding home haldol, seroquel, trazadone  Hypothyroidism: TSH elevated to 12 P:  Restart synthroid home dose and follow up free T4   Best practice:  Diet: NPO Pain/Anxiety/Delirium protocol (if indicated): CIWA ICU VAP protocol (if indicated): n/a DVT prophylaxis: hold for now GI prophylaxis: PPI BID Glucose control: D10 gtt Mobility: BR Code Status: Full  Family Communication: mother updated by phone  Disposition: ICU  Labs   CBC: Recent Labs  Lab 10/06/19 0020  WBC 4.8  NEUTROABS 3.8  HGB 5.9*  HCT 21.0*  MCV 99.1    PLT 17*    Basic Metabolic Panel: Recent Labs  Lab 10/06/19 0020 10/06/19 0614  NA 120* 123*  K 3.1*  --   CL 85*  --   CO2 12*  --   GLUCOSE 98  --   BUN 5*  --   CREATININE 0.72  --   CALCIUM 8.2*  --   MG  --  2.0  PHOS  --  1.1*   GFR: Estimated Creatinine Clearance: 104.4 mL/min (by C-G formula based on SCr of 0.72 mg/dL). Recent Labs  Lab 10/06/19 0020  PROCALCITON 1.24  WBC 4.8    Liver Function Tests: Recent Labs  Lab 10/06/19 0020  AST 1,663*  ALT 184*  ALKPHOS 132*  BILITOT 23.7*  PROT 7.1  ALBUMIN 2.1*   Recent Labs  Lab 10/06/19 0020  LIPASE 49   No results for input(s): AMMONIA in the last 168 hours.  ABG    Component Value Date/Time   PHART 7.448 09/04/2016 0329   PCO2ART 43.8 09/04/2016 0329   PO2ART 75.8 (L) 09/04/2016 0329   HCO3 29.9 (H) 09/04/2016 0329   O2SAT 94.4 09/04/2016 0329     Coagulation Profile: Recent Labs  Lab 10/06/19 0020  INR 3.5*    Cardiac Enzymes: Recent Labs  Lab 10/06/19 0025  CKTOTAL 134  HbA1C: Hgb A1c MFr Bld  Date/Time Value Ref Range Status  08/28/2018 08:54 AM 4.9 4.8 - 5.6 % Final    Comment:    (NOTE) Pre diabetes:          5.7%-6.4% Diabetes:              >6.4% Glycemic control for   <7.0% adults with diabetes   11/06/2017 06:38 AM 4.7 (L) 4.8 - 5.6 % Final    Comment:    (NOTE) Pre diabetes:          5.7%-6.4% Diabetes:              >6.4% Glycemic control for   <7.0% adults with diabetes     CBG: Recent Labs  Lab 10/05/19 2342 10/06/19 0124 10/06/19 0338 10/06/19 0443  GLUCAP 97 86 87 103*    Review of Systems:   Unable to complete due to pt encephalopathy  Past Medical History  She,  has a past medical history of Alcoholism ([place]), Anemia, Anxiety, Arthritis, Asthma, Bipolar disorder ([place]), Cancer ([place]), Chronic kidney disease, Complication of anesthesia, Depression, Elevated liver enzymes, GERD (gastroesophageal reflux disease), H/O alcohol abuse, Heart  murmur, Hepatitis, Hepatitis C, Hypothyroidism, Mallory-Weiss tear, Nicotine dependence, Schizophrenia ([facility]), Seizures ([place]), Thrombocytopenia ([facility]), Tibial plateau fracture, right, and Upper GI bleed.   Surgical History    Past Surgical History:  Procedure Laterality Date  . APPENDECTOMY    . DILATION AND CURETTAGE OF UTERUS    . ESOPHAGOGASTRODUODENOSCOPY (EGD) WITH PROPOFOL N/A 09/04/2016   Procedure: ESOPHAGOGASTRODUODENOSCOPY (EGD) WITH PROPOFOL;  Surgeon: Doran Stabler, MD;  Location: [place];  Service: Endoscopy;  Laterality: N/A;  . HARDWARE REMOVAL Right 06/18/2017   Procedure: REMOVAL RIGHT EXTERNAL FIXATOR;  Surgeon: Altamese [place], MD;  Location: [place];  Service: Orthopedics;  Laterality: Right;  . HERNIA REPAIR     umbilical hernia  . KNEE CLOSED REDUCTION Right 06/18/2017   Procedure: CLOSED MANIPULATION UNDER ANESTHESIA RIGHT KNEE;  Surgeon: Altamese [place], MD;  Location: [place];  Service: Orthopedics;  Laterality: Right;  . ORIF FEMUR FRACTURE Right 07/18/2017   Procedure: REPAIR NONUNION WITH RIA;  Surgeon: Altamese [place], MD;  Location: [place];  Service: Orthopedics;  Laterality: Right;  . ORIF TIBIA PLATEAU       Social History   reports that she has been smoking cigarettes. She has been smoking about 1.00 pack per day. She has never used smokeless tobacco. She reports current alcohol use of about 16.0 standard drinks of alcohol per week. She reports that she does not use drugs.   Family History   Her family history includes Alcoholism in her brother; Mental illness in an other family member; Thyroid disease in an other family member.   Allergies Allergies  Allergen Reactions  . Ondansetron Nausea And Vomiting  . Fish Allergy Nausea And Vomiting  . Grapeseed Extract [Nutritional Supplements] Hives     Home Medications  Prior to Admission medications   Medication Sig Start Date End Date Taking? Authorizing Provider  haloperidol (HALDOL) 10 MG tablet Take 10  mg by mouth 3 (three) times daily.    [provider]  levETIRAcetam (KEPPRA) 500 MG tablet Take 1 tablet (500 mg total) by mouth 2 (two) times daily. 10/27/18   Johnn Hai, MD  levothyroxine (SYNTHROID, LEVOTHROID) 100 MCG tablet Take 1 tablet (100 mcg total) by mouth daily at 6 (six) AM. 09/02/18   Clapacs, Madie [place], MD  naphazoline-glycerin (CLEAR EYES REDNESS) 0.012-0.2 % SOLN  Place 1-2 drops into both eyes 4 (four) times daily as needed for eye irritation.    [provider]  nicotine (NICODERM CQ - DOSED IN MG/24 HOURS) 21 mg/24hr patch Place 21 mg onto the skin daily.    [provider]  QUEtiapine (SEROQUEL) 300 MG tablet Take 300 mg by mouth 3 (three) times daily.    [provider]  Skin Protectants, Misc. (EUCERIN) cream Apply 1 application topically as needed for dry skin.    [provider]  traZODone (DESYREL) 50 MG tablet Take 50 mg by mouth at bedtime. 10/17/18   [provider]     Critical care time: 70 mins      Vickki Muff MD PGY-3 Internal Medicine Pager # [phone]

## 2019-10-06 NOTE — Progress Notes (Signed)
[facility] Progress Note Patient Name: Dawn Foley DOB: 16-Jul-1986 MRN: [id number]   Date of Service  10/06/2019  HPI/Events of Note  RN requesting order to check serum sodium .  eICU Interventions  BMET ordered, Saline infusion increased to 50 ml/ hour,        Dawn Foley 10/06/2019, 4:57 AM

## 2019-10-06 NOTE — Procedures (Signed)
Ultrasound-guided diagnostic and therapeutic paracentesis performed yielding 70 mililiters of amber colored fluid.  Fluid was sent to lab for analysis. No immediate complications. EBL is none.

## 2019-10-06 NOTE — Procedures (Addendum)
Cortrak  Person Inserting Tube:  Dawn Foley, RD Tube Type:  Cortrak - 43 inches Tube Location:  Left nare Initial Placement:  Stomach Secured by: Bridle Technique Used to Measure Tube Placement:  Documented cm marking at nare/ corner of mouth Cortrak Secured At:  77 cm   No x-ray is required. RN may begin using tube.   If the tube becomes dislodged please keep the tube and contact the Cortrak team at [email or web address] ([email or web address]) for replacement.  If after hours and replacement cannot be delayed, place a NG tube and confirm placement with an abdominal x-ray.    Mariana Single RD, LDN Clinical Nutrition Pager # [phone]

## 2019-10-06 NOTE — Progress Notes (Signed)
Bilateral lower extremity venous duplex completed.  Preliminary results can be found under CV proc under chart review.  10/06/2019 4:41 PM  Latice Waitman, K., RDMS, RVT

## 2019-10-06 NOTE — Progress Notes (Addendum)
Initial Nutrition Assessment  DOCUMENTATION CODES:   Not applicable  INTERVENTION:    Vital 1.5 at 20 ml/h, increase by 10 ml every 4 hours to goal rate of 60 ml/h (1440 ml per day)   Pro-stat 30 ml once daily   Provides 2260 kcal, 112 gm protein, 1100 ml free water daily   Monitor magnesium, potassium, and phosphorus levels, MD to replete as needed, as pt is at risk for refeeding syndrome given alcohol abuse.  NUTRITION DIAGNOSIS:   Inadequate oral intake related to lethargy/confusion as evidenced by NPO status.  GOAL:   Patient will meet greater than or equal to 90% of their needs  MONITOR:   TF tolerance, Labs, Skin, Diet advancement  REASON FOR ASSESSMENT:   Consult Enteral/tube feeding initiation and management  ASSESSMENT:   34 yo female admitted with acute alcoholic hepatitis with acute liver failure. PMH includes hepatitis C, alcoholism, depression, bipolar D/O, colon cancer, hypothyroidism, CKD, seizures, smoker.   Spoke with patient's mother at bedside who reports patient is usually a good eater, unless she is drinking. She thinks her weight has been stable PTA. She has no food allergies.  Patient remains encephalopathic. Unable to wake up enough to take PO's. Remains NPO.  Cortrak placed with tip in the stomach earlier today. Received MD Consult for TF initiation and management.  Labs reviewed. Na 124 (L), K 3.4 (L), phos 2 (L), AST 1588 (H), total bili 25 (H) CBG's: [phone]  Medications reviewed and include folic acid, lactulose, prednisolone, thiamine, vitamin K, sodium phosphate, precedex.  Weight is currently above weight from 5 months ago. Suspect increase in weight is related to volume overload.  Usual weight 72.6 kg (EDW) 5 months ago  NUTRITION - FOCUSED PHYSICAL EXAM:    Most Recent Value  Orbital Region  No depletion  Upper Arm Region  No depletion  Thoracic and Lumbar Region  No depletion  Buccal Region  No depletion  Temple  Region  No depletion  Clavicle Bone Region  No depletion  Clavicle and Acromion Bone Region  No depletion  Scapular Bone Region  No depletion  Dorsal Hand  No depletion  Patellar Region  No depletion  Anterior Thigh Region  No depletion  Posterior Calf Region  No depletion  Edema (RD Assessment)  Severe  Hair  Reviewed  Eyes  Unable to assess  Mouth  Unable to assess  Skin  Reviewed [jaundiced]  Nails  Reviewed       Diet Order:   Diet Order            Diet NPO time specified  Diet effective now              EDUCATION NEEDS:   Not appropriate for education at this time  Skin:  Skin Assessment: Reviewed RN Assessment(jaundiced)  Last BM:  No BM documented  Height:   Ht Readings from Last 1 Encounters:  10/05/19 5\' 3"  (1.6 m)    Weight:   Wt Readings from Last 1 Encounters:  10/05/19 85.2 kg    Ideal Body Weight:  52.3 kg  BMI:  Body mass index is 33.27 kg/m.  Estimated Nutritional Needs:   Kcal:  2200-2400  Protein:  >/= 109 gm  Fluid:  1-1.2 L    Molli Barrows, RD, LDN, [place] Pager [phone] After Hours Pager [phone]

## 2019-10-07 DIAGNOSIS — R188 Other ascites: Secondary | ICD-10-CM

## 2019-10-07 DIAGNOSIS — K7290 Hepatic failure, unspecified without coma: Secondary | ICD-10-CM

## 2019-10-07 LAB — DIC (DISSEMINATED INTRAVASCULAR COAGULATION)PANEL
D-Dimer, Quant: 9.68 ug/mL-FEU — ABNORMAL HIGH (ref 0.00–0.50)
Fibrinogen: 282 mg/dL (ref 210–475)
INR: 2.6 — ABNORMAL HIGH (ref 0.8–1.2)
Platelets: 39 10*3/uL — ABNORMAL LOW (ref 150–400)
Prothrombin Time: 27.8 seconds — ABNORMAL HIGH (ref 11.4–15.2)
Smear Review: NONE SEEN
aPTT: 41 seconds — ABNORMAL HIGH (ref 24–36)

## 2019-10-07 LAB — RENAL FUNCTION PANEL
Albumin: 2 g/dL — ABNORMAL LOW (ref 3.5–5.0)
Albumin: 1.8 g/dL — ABNORMAL LOW (ref 3.5–5.0)
Albumin: 1.7 g/dL — ABNORMAL LOW (ref 3.5–5.0)
Anion gap: 11 (ref 5–15)
Anion gap: 10 (ref 5–15)
Anion gap: 11 (ref 5–15)
BUN: 6 mg/dL (ref 6–20)
BUN: 5 mg/dL — ABNORMAL LOW (ref 6–20)
BUN: <5 mg/dL — ABNORMAL LOW (ref 6–20)
CO2: 23 mmol/L (ref 22–32)
CO2: 22 mmol/L (ref 22–32)
CO2: 22 mmol/L (ref 22–32)
Calcium: 7.7 mg/dL — ABNORMAL LOW (ref 8.9–10.3)
Calcium: 7.4 mg/dL — ABNORMAL LOW (ref 8.9–10.3)
Calcium: 7.3 mg/dL — ABNORMAL LOW (ref 8.9–10.3)
Chloride: 92 mmol/L — ABNORMAL LOW (ref 98–111)
Chloride: 98 mmol/L (ref 98–111)
Chloride: 98 mmol/L (ref 98–111)
Creatinine, Ser: 0.46 mg/dL (ref 0.44–1.00)
Creatinine, Ser: 0.32 mg/dL — ABNORMAL LOW (ref 0.44–1.00)
Creatinine, Ser: 0.41 mg/dL — ABNORMAL LOW (ref 0.44–1.00)
GFR calc Af Amer: >60 mL/min (ref >60)
GFR calc Af Amer: >60 mL/min (ref >60)
GFR calc Af Amer: >60 mL/min (ref >60)
GFR calc non Af Amer: >60 mL/min (ref >60)
GFR calc non Af Amer: >60 mL/min (ref >60)
GFR calc non Af Amer: >60 mL/min (ref >60)
Glucose, Bld: 138 mg/dL — ABNORMAL HIGH (ref 70–99)
Glucose, Bld: 199 mg/dL — ABNORMAL HIGH (ref 70–99)
Glucose, Bld: 213 mg/dL — ABNORMAL HIGH (ref 70–99)
Phosphorus: 2 mg/dL — ABNORMAL LOW (ref 2.5–4.6)
Phosphorus: 2.1 mg/dL — ABNORMAL LOW (ref 2.5–4.6)
Phosphorus: 1.4 mg/dL — ABNORMAL LOW (ref 2.5–4.6)
Potassium: 3.4 mmol/L — ABNORMAL LOW (ref 3.5–5.1)
Potassium: 3.7 mmol/L (ref 3.5–5.1)
Potassium: 3.3 mmol/L — ABNORMAL LOW (ref 3.5–5.1)
Sodium: 126 mmol/L — ABNORMAL LOW (ref 135–145)
Sodium: 130 mmol/L — ABNORMAL LOW (ref 135–145)
Sodium: 131 mmol/L — ABNORMAL LOW (ref 135–145)

## 2019-10-07 LAB — COMPREHENSIVE METABOLIC PANEL
ALT: 127 U/L — ABNORMAL HIGH (ref 0–44)
AST: 970 U/L — ABNORMAL HIGH (ref 15–41)
Albumin: 1.8 g/dL — ABNORMAL LOW (ref 3.5–5.0)
Alkaline Phosphatase: 113 U/L (ref 38–126)
Anion gap: 12 (ref 5–15)
BUN: 5 mg/dL — ABNORMAL LOW (ref 6–20)
CO2: 21 mmol/L — ABNORMAL LOW (ref 22–32)
Calcium: 7.7 mg/dL — ABNORMAL LOW (ref 8.9–10.3)
Chloride: 93 mmol/L — ABNORMAL LOW (ref 98–111)
Creatinine, Ser: 0.4 mg/dL — ABNORMAL LOW (ref 0.44–1.00)
GFR calc Af Amer: >60 mL/min (ref >60)
GFR calc non Af Amer: >60 mL/min (ref >60)
Glucose, Bld: 156 mg/dL — ABNORMAL HIGH (ref 70–99)
Potassium: 3.4 mmol/L — ABNORMAL LOW (ref 3.5–5.1)
Sodium: 126 mmol/L — ABNORMAL LOW (ref 135–145)
Total Bilirubin: 27.2 mg/dL (ref 0.3–1.2)
Total Protein: 6.7 g/dL (ref 6.5–8.1)

## 2019-10-07 LAB — TYPE AND SCREEN
ABO/RH(D): O POS
Antibody Screen: NEGATIVE
Unit division: 0
Unit division: 0

## 2019-10-07 LAB — BPAM RBC
Blood Product Expiration Date: 202102122359
Blood Product Expiration Date: 202102232359
ISSUE DATE / TIME: 202101260130
ISSUE DATE / TIME: 202101260331
Unit Type and Rh: 5100
Unit Type and Rh: 5100

## 2019-10-07 LAB — PREPARE PLATELET PHERESIS: Unit division: 0

## 2019-10-07 LAB — HIV ANTIBODY (ROUTINE TESTING W REFLEX): HIV Screen 4th Generation wRfx: NONREACTIVE

## 2019-10-07 LAB — CBC
HCT: 25.4 % — ABNORMAL LOW (ref 36.0–46.0)
HCT: 23.3 % — ABNORMAL LOW (ref 36.0–46.0)
Hemoglobin: 8.1 g/dL — ABNORMAL LOW (ref 12.0–15.0)
Hemoglobin: 7.5 g/dL — ABNORMAL LOW (ref 12.0–15.0)
MCH: 28.9 pg (ref 26.0–34.0)
MCH: 28.5 pg (ref 26.0–34.0)
MCHC: 31.9 g/dL (ref 30.0–36.0)
MCHC: 32.2 g/dL (ref 30.0–36.0)
MCV: 90.7 fL (ref 80.0–100.0)
MCV: 88.6 fL (ref 80.0–100.0)
Platelets: 39 10*3/uL — ABNORMAL LOW (ref 150–400)
Platelets: 43 10*3/uL — ABNORMAL LOW (ref 150–400)
RBC: 2.8 MIL/uL — ABNORMAL LOW (ref 3.87–5.11)
RBC: 2.63 MIL/uL — ABNORMAL LOW (ref 3.87–5.11)
RDW: 18 % — ABNORMAL HIGH (ref 11.5–15.5)
RDW: 18.2 % — ABNORMAL HIGH (ref 11.5–15.5)
WBC: 4.9 10*3/uL (ref 4.0–10.5)
WBC: 4 10*3/uL (ref 4.0–10.5)
nRBC: 6 % — ABNORMAL HIGH (ref 0.0–0.2)
nRBC: 6 % — ABNORMAL HIGH (ref 0.0–0.2)

## 2019-10-07 LAB — BPAM PLATELET PHERESIS
Blood Product Expiration Date: 202101282359
ISSUE DATE / TIME: 202101260629
Unit Type and Rh: 6200

## 2019-10-07 LAB — ENA+DNA/DS+ANTICH+CENTRO+JO...
Anti JO-1: <0.2 AI (ref 0.0–0.9)
Centromere Ab Screen: <0.2 AI (ref 0.0–0.9)
Chromatin Ab SerPl-aCnc: 0.6 AI (ref 0.0–0.9)
ENA SM Ab Ser-aCnc: <0.2 AI (ref 0.0–0.9)
Ribonucleic Protein: >8 AI — ABNORMAL HIGH (ref 0.0–0.9)
SSA (Ro) (ENA) Antibody, IgG: <0.2 AI (ref 0.0–0.9)
SSB (La) (ENA) Antibody, IgG: <0.2 AI (ref 0.0–0.9)
Scleroderma (Scl-70) (ENA) Antibody, IgG: <0.2 AI (ref 0.0–0.9)
ds DNA Ab: 2 IU/mL (ref 0–9)

## 2019-10-07 LAB — GLUCOSE, CAPILLARY
Glucose-Capillary: 132 mg/dL — ABNORMAL HIGH (ref 70–99)
Glucose-Capillary: 143 mg/dL — ABNORMAL HIGH (ref 70–99)
Glucose-Capillary: 166 mg/dL — ABNORMAL HIGH (ref 70–99)
Glucose-Capillary: 177 mg/dL — ABNORMAL HIGH (ref 70–99)
Glucose-Capillary: 192 mg/dL — ABNORMAL HIGH (ref 70–99)
Glucose-Capillary: 192 mg/dL — ABNORMAL HIGH (ref 70–99)
Glucose-Capillary: 209 mg/dL — ABNORMAL HIGH (ref 70–99)

## 2019-10-07 LAB — ANTI-SMOOTH MUSCLE ANTIBODY, IGG: F-Actin IgG: 13 Units (ref 0–19)

## 2019-10-07 LAB — PHOSPHORUS: Phosphorus: <1 mg/dL — CL (ref 2.5–4.6)

## 2019-10-07 LAB — ANA W/REFLEX IF POSITIVE: Anti Nuclear Antibody (ANA): POSITIVE — AB

## 2019-10-07 LAB — CMV DNA, QUANTITATIVE, PCR
CMV DNA Quant: NEGATIVE IU/mL
Log10 CMV Qn DNA Pl: UNDETERMINED log10 IU/mL

## 2019-10-07 LAB — HCV RNA QUANT RFLX ULTRA OR GENOTYP
HCV RNA Qnt(log copy/mL): UNDETERMINED log10 IU/mL
HepC Qn: NOT DETECTED IU/mL

## 2019-10-07 LAB — PROTIME-INR
INR: 2.5 — ABNORMAL HIGH (ref 0.8–1.2)
Prothrombin Time: 27.3 seconds — ABNORMAL HIGH (ref 11.4–15.2)

## 2019-10-07 LAB — ALBUMIN, PLEURAL OR PERITONEAL FLUID: Albumin, Fluid: 1.2 g/dL

## 2019-10-07 LAB — FOLATE RBC
Folate, Hemolysate: 192 ng/mL
Folate, RBC: 696 ng/mL (ref >498)
Hematocrit: 27.6 % — ABNORMAL LOW (ref 34.0–46.6)

## 2019-10-07 LAB — AMMONIA: Ammonia: 127 umol/L — ABNORMAL HIGH (ref 9–35)

## 2019-10-07 LAB — ANTI-MICROSOMAL ANTIBODY LIVER / KIDNEY: LKM1 Ab: 2.8 Units (ref 0.0–20.0)

## 2019-10-07 LAB — MAGNESIUM: Magnesium: 1.7 mg/dL (ref 1.7–2.4)

## 2019-10-07 LAB — PROCALCITONIN: Procalcitonin: 1.64 ng/mL

## 2019-10-07 LAB — VARICELLA ZOSTER ANTIBODY, IGG: Varicella IgG: 170 index (ref >165)

## 2019-10-07 MED ORDER — LACTULOSE 10 GM/15ML PO SOLN
30.0000 g | Freq: Two times a day (BID) | ORAL | Status: DC
  Administered 2019-10-07: 30 g
  Filled 2019-10-07 (×2): qty 45

## 2019-10-07 MED ORDER — INSULIN ASPART 100 UNIT/ML ~~LOC~~ SOLN: 0.0000 [IU] | Freq: Three times a day (TID) | SUBCUTANEOUS | Status: DC

## 2019-10-07 MED ORDER — INSULIN ASPART 100 UNIT/ML ~~LOC~~ SOLN
0.0000 [IU] | SUBCUTANEOUS | Status: DC
  Administered 2019-10-07: 4 [IU] via SUBCUTANEOUS
  Administered 2019-10-07: 7 [IU] via SUBCUTANEOUS
  Administered 2019-10-08: 4 [IU] via SUBCUTANEOUS
  Administered 2019-10-08: 3 [IU] via SUBCUTANEOUS
  Administered 2019-10-08 (×3): 4 [IU] via SUBCUTANEOUS
  Administered 2019-10-08: 3 [IU] via SUBCUTANEOUS
  Administered 2019-10-09: 4 [IU] via SUBCUTANEOUS
  Administered 2019-10-09 (×2): 7 [IU] via SUBCUTANEOUS
  Administered 2019-10-09 (×4): 4 [IU] via SUBCUTANEOUS
  Administered 2019-10-10: 16:00:00 3 [IU] via SUBCUTANEOUS
  Administered 2019-10-10: 4 [IU] via SUBCUTANEOUS
  Administered 2019-10-10: 04:00:00 3 [IU] via SUBCUTANEOUS
  Administered 2019-10-10: 08:00:00 4 [IU] via SUBCUTANEOUS

## 2019-10-07 MED ORDER — SODIUM CHLORIDE 0.9 % IV SOLN
INTRAVENOUS | Status: DC | PRN
  Administered 2019-10-11: 250 mL via INTRAVENOUS

## 2019-10-07 MED ORDER — MUPIROCIN 2 % EX OINT
1.0000 "application " | TOPICAL_OINTMENT | Freq: Two times a day (BID) | CUTANEOUS | Status: AC
  Administered 2019-10-07 – 2019-10-11 (×10): 1 via NASAL
  Filled 2019-10-07 (×3): qty 22

## 2019-10-07 MED ORDER — INSULIN ASPART 100 UNIT/ML ~~LOC~~ SOLN
0.0000 [IU] | SUBCUTANEOUS | Status: DC
  Administered 2019-10-07: 2 [IU] via SUBCUTANEOUS

## 2019-10-07 MED ORDER — SODIUM CHLORIDE 0.45 % IV SOLN: INTRAVENOUS | Status: DC

## 2019-10-07 MED ORDER — POTASSIUM & SODIUM PHOSPHATES 280-160-250 MG PO PACK
1.0000 | PACK | Freq: Three times a day (TID) | ORAL | Status: AC
  Administered 2019-10-07 (×2): 1 via ORAL
  Filled 2019-10-07 (×2): qty 1

## 2019-10-07 MED ORDER — SODIUM CHLORIDE 0.9 % IV SOLN: INTRAVENOUS | Status: DC | PRN

## 2019-10-07 MED ORDER — POTASSIUM PHOSPHATES 15 MMOLE/5ML IV SOLN
20.0000 mmol | Freq: Once | INTRAVENOUS | Status: AC
  Administered 2019-10-07: 20 mmol via INTRAVENOUS
  Filled 2019-10-07: qty 6.67

## 2019-10-07 MED ORDER — FREE WATER
200.0000 mL | Freq: Three times a day (TID) | Status: DC
  Administered 2019-10-07 – 2019-10-08 (×3): 200 mL

## 2019-10-07 MED ORDER — POTASSIUM PHOSPHATES 15 MMOLE/5ML IV SOLN
30.0000 mmol | Freq: Once | INTRAVENOUS | Status: AC
  Administered 2019-10-07: 30 mmol via INTRAVENOUS
  Filled 2019-10-07: qty 10

## 2019-10-07 MED ORDER — CHLORHEXIDINE GLUCONATE CLOTH 2 % EX PADS
6.0000 | MEDICATED_PAD | Freq: Every day | CUTANEOUS | Status: AC
  Administered 2019-10-08 – 2019-10-11 (×5): 6 via TOPICAL

## 2019-10-07 NOTE — Progress Notes (Addendum)
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 Workup consistent with acute liver failure with multiple metabolic derangements and coagulopathies transferred to [facility] for higher level of care.     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 Severe hepatic steatosis and hepatomegaly 2. Moderate to large amount of free fluid within the pelvis extending into the right abdomen which may be mildly complicated/ complex  [id number] RUQ [place] >> 1. Gallbladder sludge.  No sonographic evidence for acute cholecystitis or bilary dilation.  2. Enlarged echogenic liver suggesting fatty inflitration  Micro Data:  1/26 BC x2 >> 1/26 UC >>  Antimicrobials:  1/26 zosyn >>  Interim history/subjective:  No acute events overnight.  Nurse  reports some intermittent improvement in mental status overnight.  Answering some yes no questions very slow mentation, difficulty following simple commands  Objective   Blood pressure 134/80, pulse (!) 110, temperature 99 F (37.2 C), resp. rate 14, height 5\' 3"  (1.6 m), weight 89 kg, SpO2 99 %.        Intake/Output Summary (Last 24 hours) at 10/07/2019 0711 Last data filed at 10/07/2019 0600 Gross per 24 hour  Intake 3579.61 ml  Output 1895 ml  Net 1684.61 ml   Filed Weights   10/05/19 2335 10/07/19 0500  Weight: 85.2 kg 89 kg    Examination: General:  Ill appearing young WF in NAD HEENT: pupils 4/reactive, scleral icterus  Neuro: awakens to voice, stares off, answers occasional questions very slowly CV: tachycardic with regular rhythm, no MRG PULM:   CTAB, no increased wob GI: protuberant, prominent hepatomegaly, soft, no rebound tenderness Extremities: warm/dry, +2-3 generalized edema  Skin: jaundiced  Resolved Hospital Problem list     Assessment & Plan:   Acute liver failure   Hx Hep C (do not see that she has been treated), Chronic heavy ETOH abuse, MELD score 33- with 3 month 52.6% mortality rate.  acetaminophen, HIV, cortisol, acute hepatitis panel, CMV, EBV, HSV, varicella, CK, ANA, antimitochondrial ab, anti-smooth muscle antibody.  GI repeated these and added MM panel and IgG.  So far Hep A, B, C neg, EBV labs suggest prior infection but not active.  Paracentesis of left side of abdomen performed 1/26 P:  GI following Continue prednisolone Continue empiric zosyn  Follow up Acute liver failure workup listed above Trend hepatic function/ coags/ ammonia/ - daily  Continue lactulose decrease to BID dosing due to large output   Encephalopathy-  hepatic and metabolic  High risk for DTs, unclear last ETOH use (ETOH neg 1/25) Hx alcohol related seizures.  Severe anemia, hyponatremia, also contributing Ammonia level at [facility] 133  P:  Serial neuro exams Ammonia  slowly down trending, actulose as above ICU CIWA, precedex if needed versed prn  Seizure precautions Keppra BID IV  Daily thiamine/ folate IV   Severe Anemia, Thrombocytopenia - severe thrombocytopenia, anemia, INR 4 likely related to liver failure , seems to be mixture of chronic blood loss anemia and bone marrow suppression from alcohol use.  OSH labs noted for B12 > 1000, folate and TIBC normal, iron low 19 with low saturation of 6.2%, ferritin 326 hemoccult negative.  Has received 4 u PRBC's and 1FFP/1Platelets as of 1/26 am.   P:  Hgb stable confirming chronic losses, transfuse for Hgb >7 Serial hgb checks PPI BID Sepsis workup as below   High risk for airway compromise related to encephalopathy  P:  Monitor  Supplemental O2 prn    Right sided Pelvic/abdominal complex/ complicated fluid collection Small volume ascites does not seem to communicate with pelvic fluid. Left sided abdominal paracentesis performed 1/26 70cc fluid removed.   P:  Follow up cultures Spoke to IR Dr. Annamaria Boots about pelvic fluid collection they feel there is enough communication that the sample they procured should be adequate.  We could consider a CT abd and pelvis with contrast if able to verify but they do not feel strongly about this.   Empiric zosyn for now, de-escalate as able  Check/ trend PCT  Trend WBC/ fever curve   Hyponatremia At risk for AKI P:  Q6 RFP- adjust fluids as needed to avoid rapid rise has been slow correction so far Continue foley/ strict I/Os   Severe malnutrition, Hypoglycemia likely related to liver dysfunction and alcohol use disorder.  At risk for refeeding syndrome P:  Serial cbg's Tube feeds Aggressive phos replacement    Hx depression, polysubstance abuse, schizophrenia P:  Holding home haldol, seroquel, trazadone with encephalopathy  Hypothyroidism: TSH elevated to 12 P:  Continue synthroid, repeat Free t4 and adjust dosage as needed   Best practice:    Diet: Tube feeds Pain/Anxiety/Delirium protocol (if indicated): CIWA ICU VAP protocol (if indicated): n/a DVT prophylaxis: SCD GI prophylaxis: PPI BID Glucose control: cbg monitoring Mobility: BR Code Status: Full  Family Communication: mother updated by phone  Disposition: ICU  Labs   CBC: Recent Labs  Lab 10/06/19 0020 10/06/19 0851 10/06/19 1514 10/07/19 0001 10/07/19 0043  WBC 4.8 3.4* 3.1*  --  4.9  NEUTROABS 3.8  --   --   --   --   HGB 5.9* 7.5* 7.7*  --  8.1*  HCT 21.0* 22.9* 24.0*  --  25.4*  MCV 99.1 88.8 88.9  --  90.7  PLT 17* 34* 33* 39* 39*    Basic Metabolic Panel: Recent Labs  Lab 10/06/19 0614 10/06/19 0851 10/06/19 1314 10/06/19 1514 10/06/19 1818 10/07/19 0001 10/07/19 0437  NA 123*  123*   < > 127*  126* 126* 121* 126* 126*  K 3.4*   < > 3.5  3.7 2.8* 4.4 3.4* 3.4*  CL 89*   < > 91*  92* 92* 88* 92* 93*  CO2 16*   < > 17*  18* 22 16* 23 21*  GLUCOSE 94   < > 94  106* 128* 121* 138* 156*  BUN 6   < > 6  7 6 6 6  5*  CREATININE 0.57   < > 0.51  0.39* 0.51 <0.30* 0.46 0.40*  CALCIUM 8.2*   < > 8.1*  7.6* 7.7* 7.5* 7.7* 7.7*  MG 2.0  --   --   --   --   --  1.7  PHOS 1.1*   < > 1.5*  2.0* 2.4* 2.1* 2.0* <1.0*   < > = values in this interval not displayed.   GFR: Estimated Creatinine Clearance: 106.8 mL/min (A) (by C-G formula based on SCr of 0.4 mg/dL (L)). Recent Labs  Lab 10/06/19 0020 10/06/19 0851 10/06/19 1514 10/07/19 0043 10/07/19 0437  PROCALCITON 1.24  --   --   --  1.64  WBC 4.8 3.4* 3.1* 4.9  --     Liver Function Tests: Recent Labs  Lab 10/06/19 0020 10/06/19 0020 10/06/19 0851 10/06/19 0851 10/06/19 1314 10/06/19 1514 10/06/19 1818 10/07/19 0001 10/07/19 0437  AST 1,663*  --  1,588*  --   --   --   --   --  970*  ALT 184*  --  148*  --   --   --   --   --  127*  ALKPHOS 132*  --  120  --   --   --   --   --  113  BILITOT 23.7*  --  25.0*  --   --   --   --   --  27.2*  PROT 7.1  --  6.9  --   --    --   --   --  6.7  ALBUMIN 2.1*   < > 2.1*  2.1*   < > 2.2*  2.0* 2.0* 2.0* 2.0* 1.8*   < > = values in this interval not displayed.   Recent Labs  Lab 10/06/19 0020 10/06/19 0851  LIPASE 49  --   AMYLASE  --  79   Recent Labs  Lab 10/07/19 0428  AMMONIA 127*    ABG    Component Value Date/Time   PHART 7.448 09/04/2016 0329   PCO2ART 43.8 09/04/2016 0329   PO2ART 75.8 (L) 09/04/2016 0329   HCO3 29.9 (H) 09/04/2016 0329   O2SAT 94.4 09/04/2016 0329     Coagulation Profile: Recent Labs  Lab 10/06/19 0020 10/07/19 0001 10/07/19 0437  INR 3.5* 2.6* 2.5*    Cardiac Enzymes: Recent Labs  Lab 10/06/19 0025  CKTOTAL 134    HbA1C: Hgb A1c MFr Bld  Date/Time Value Ref Range Status  08/28/2018 08:54 AM 4.9 4.8 - 5.6 % Final    Comment:    (NOTE) Pre diabetes:          5.7%-6.4% Diabetes:              >6.4% Glycemic control for   <7.0% adults with diabetes   11/06/2017 06:38 AM 4.7 (L) 4.8 - 5.6 % Final    Comment:    (NOTE) Pre diabetes:          5.7%-6.4% Diabetes:              >6.4% Glycemic control for   <7.0% adults with diabetes     CBG: Recent Labs  Lab 10/06/19 1141 10/06/19 1550 10/06/19 1950 10/07/19 0009 10/07/19 0432  GLUCAP 95 141* 123* 132* 143*    Review of Systems:   Unable to complete due to pt encephalopathy  Past Medical History  She,  has a past medical history of Alcoholism ([place]), Anemia, Anxiety, Arthritis, Asthma, Bipolar disorder ([place]), Cancer (  [place]), Chronic kidney disease, Complication of anesthesia, Depression, Elevated liver enzymes, GERD (gastroesophageal reflux disease), H/O alcohol abuse, Heart murmur, Hepatitis, Hepatitis C, Hypothyroidism, Mallory-Weiss tear, Nicotine dependence, Schizophrenia ([place]), Seizures ([place]), Thrombocytopenia ([place]), Tibial plateau fracture, right, and Upper GI bleed.   Surgical History    Past Surgical History:  Procedure Laterality Date  . APPENDECTOMY    . DILATION AND CURETTAGE OF  UTERUS    . ESOPHAGOGASTRODUODENOSCOPY (EGD) WITH PROPOFOL N/A 09/04/2016   Procedure: ESOPHAGOGASTRODUODENOSCOPY (EGD) WITH PROPOFOL;  Surgeon: Doran Stabler, MD;  Location: [facility];  Service: Endoscopy;  Laterality: N/A;  . HARDWARE REMOVAL Right 06/18/2017   Procedure: REMOVAL RIGHT EXTERNAL FIXATOR;  Surgeon: Altamese Brilliant, MD;  Location: [facility];  Service: Orthopedics;  Laterality: Right;  . HERNIA REPAIR     umbilical hernia  . IR PARACENTESIS  10/06/2019  . KNEE CLOSED REDUCTION Right 06/18/2017   Procedure: CLOSED MANIPULATION UNDER ANESTHESIA RIGHT KNEE;  Surgeon: Altamese [place]enville, MD;  Location: [facility];  Service: Orthopedics;  Laterality: Right;  . ORIF FEMUR FRACTURE Right 07/18/2017   Procedure: REPAIR NONUNION WITH RIA;  Surgeon: Altamese , MD;  Location: [facility];  Service: Orthopedics;  Laterality: Right;  . ORIF TIBIA PLATEAU       Social History   reports that she has been smoking cigarettes. She has been smoking about 1.00 pack per day. She has never used smokeless tobacco. She reports current alcohol use of about 16.0 standard drinks of alcohol per week. She reports that she does not use drugs.   Family History   Her family history includes Alcoholism in her brother; Mental illness in an other family member; Thyroid disease in an other family member.   Allergies Allergies  Allergen Reactions  . Ondansetron Nausea And Vomiting  . Fish Allergy Nausea And Vomiting  . Grapeseed Extract [Nutritional Supplements] Hives  . Other Rash    Polyester     Home Medications  Prior to Admission medications   Medication Sig Start Date End Date Taking? Authorizing Provider  haloperidol (HALDOL) 10 MG tablet Take 10 mg by mouth 3 (three) times daily.    [provider]  levETIRAcetam (KEPPRA) 500 MG tablet Take 1 tablet (500 mg total) by mouth 2 (two) times daily. 10/27/18   Johnn Hai, MD  levothyroxine (SYNTHROID, LEVOTHROID) 100 MCG tablet Take 1 tablet (100 mcg  total) by mouth daily at 6 (six) AM. 09/02/18   Clapacs, Madie Reno, MD  naphazoline-glycerin (CLEAR EYES REDNESS) 0.012-0.2 % SOLN Place 1-2 drops into both eyes 4 (four) times daily as needed for eye irritation.    [provider]  nicotine (NICODERM CQ - DOSED IN MG/24 HOURS) 21 mg/24hr patch Place 21 mg onto the skin daily.    [provider]  QUEtiapine (SEROQUEL) 300 MG tablet Take 300 mg by mouth 3 (three) times daily.    [provider]  Skin Protectants, Misc. (EUCERIN) cream Apply 1 application topically as needed for dry skin.    [provider]  traZODone (DESYREL) 50 MG tablet Take 50 mg by mouth at bedtime. 10/17/18   [provider]     Critical care time: 70 mins      Vickki Muff MD PGY-3 Internal Medicine Pager # [phone]

## 2019-10-07 NOTE — Progress Notes (Signed)
[facility] Progress Note Patient Name: Nikkisha Lipe DOB: 12-14-1985 MRN: [id number]   Date of Service  10/07/2019  HPI/Events of Note  Pt serum sodium increased by 5 meq over 6 hours.  eICU Interventions  Normal saline infusion discontinued, BMET Q 4 hours x 4        Haldon Carley U Mallarie Voorhies 10/07/2019, 1:55 AM

## 2019-10-07 NOTE — Progress Notes (Signed)
[facility] Progress Note Patient Name: Dawn Foley DOB: 06-19-1986 MRN: [id number]   Date of Service  10/07/2019  HPI/Events of Note  K+ and Phos low  eICU Interventions  Replacement orders for K+ and Phos entered        Frederik Pear 10/07/2019, 5:40 AM

## 2019-10-07 NOTE — Progress Notes (Signed)
Daily Rounding Note  10/07/2019, 9:39 AM  LOS: 2 days   SUBJECTIVE:   Chief complaint:      Tachycardia continues in the low 100s to 1 teens.  Stable blood pressures.  Satting high 90s on 2 L [place] O2 T-max 100.2 at 1700 on 1/26. Close to 900 ml liquid brown stool emptied into flexiseal since placed yest PM.   AMS persists.    OBJECTIVE:         Vital signs in last 24 hours:    Temp:  [98.8 F (37.1 C)-100.2 F (37.9 C)] 99.1 F (37.3 C) (01/27 0800) Pulse Rate:  [103-123] 112 (01/27 0800) Resp:  [13-24] 14 (01/27 0800) BP: (102-142)/(57-90) 139/73 (01/27 0800) SpO2:  [96 %-100 %] 99 % (01/27 0800) Weight:  [89 kg] 89 kg (01/27 0500) Last BM Date: 10/07/19(flexi) Filed Weights   10/05/19 2335 10/07/19 0500  Weight: 85.2 kg 89 kg   General: jaundiced.   Edematous.  Eyes open.     Heart: RRR Chest: clear bil.  No labored resps.   Abdomen: soft, NT,  Massive hepatomegaly.  No obvious fluid wave.  Liquid brown stool in flexiseal.   Extremities: non-pitting edema Neuro/Psych:  If she responds verbally it is w mumbles.  Not following commands.  Not able to elicit asterixis.    Intake/Output from previous day: 01/26 0701 - 01/27 0700 In: 3579.6 [I.V.:1056.4; Blood:382; NG/GT:865.3; IV Piggyback:1275.9] Out: 1895 [Urine:1095; Stool:800]  Intake/Output this shift: Total I/O In: 348.1 [I.V.:40; NG/GT:120; IV Piggyback:188.1] Out: 100 [Urine:100]  Lab Results: Recent Labs    10/06/19 0851 10/06/19 0851 10/06/19 1514 10/07/19 0001 10/07/19 0043  WBC 3.4*  --  3.1*  --  4.9  HGB 7.5*  --  7.7*  --  8.1*  HCT 22.9*  --  24.0*  --  25.4*  PLT 34*   < > 33* 39* 39*   < > = values in this interval not displayed.   BMET Recent Labs    10/06/19 1818 10/07/19 0001 10/07/19 0437  NA 121* 126* 126*  K 4.4 3.4* 3.4*  CL 88* 92* 93*  CO2 16* 23 21*  GLUCOSE 121* 138* 156*  BUN 6 6 5*  CREATININE <0.30* 0.46  0.40*  CALCIUM 7.5* 7.7* 7.7*   LFT Recent Labs    10/06/19 0020 10/06/19 0020 10/06/19 0851 10/06/19 1314 10/06/19 1818 10/07/19 0001 10/07/19 0437  PROT 7.1  --  6.9  --   --   --  6.7  ALBUMIN 2.1*   < > 2.1*  2.1*   < > 2.0* 2.0* 1.8*  AST 1,663*  --  1,588*  --   --   --  970*  ALT 184*  --  148*  --   --   --  127*  ALKPHOS 132*  --  120  --   --   --  113  BILITOT 23.7*  --  25.0*  --   --   --  27.2*  BILIDIR  --   --  14.9*  --   --   --   --   IBILI  --   --  10.1*  --   --   --   --    < > = values in this interval not displayed.   PT/INR Recent Labs    10/07/19 0001 10/07/19 0437  LABPROT 27.8* 27.3*  INR 2.6* 2.5*   Hepatitis Panel Recent  Labs    10/06/19 0020  HEPBSAG NON REACTIVE  HCVAB Reactive*  HEPAIGM NON REACTIVE  HEPBIGM NON REACTIVE    Studies/Results: US PELVIS LIMITED (TRANSABDOMINAL ONLY)  Result Date: 10/06/2019 CLINICAL DATA:  Ascites.  Abnormal CT EXAM: LIMITED ULTRASOUND OF PELVIS TECHNIQUE: Limited transabdominal ultrasound examination of the pelvis was performed. COMPARISON:  CT 10/05/2019 FINDINGS: Within the right lower quadrant there is a somewhat lobulated fluid collection measuring 8.0 x 4.5 x 8.5 cm. This collection does not appear freely communicating with the small volume ascites within the abdomen and pelvis. IMPRESSION: Small volume abdominopelvic ascites with a complex appearing fluid collection within the right lower quadrant which does not appear to directly communicate with the free fluid. Findings could represent a loculated component of ascites versus an intrapelvic abscess. Electronically Signed   By: Davina Poke D.O.   On: 10/06/2019 11:43   VAS [place] LOWER EXTREMITY VENOUS (DVT)  Result Date: 10/07/2019  Lower Venous Study Indications: Swelling.  Risk Factors: Cancer Colon 2009. Comparison Study: Right LEV 06-13-18, negative. Performing Technologist: Baldwin Crown ARDMS, RVT  Examination Guidelines: A complete  evaluation includes B-mode imaging, spectral Doppler, color Doppler, and power Doppler as needed of all accessible portions of each vessel. Bilateral testing is considered an integral part of a complete examination. Limited examinations for reoccurring indications may be performed as noted.  +---------+---------------+---------+-----------+----------+--------------+ RIGHT    CompressibilityPhasicitySpontaneityPropertiesThrombus Aging +---------+---------------+---------+-----------+----------+--------------+ CFV      Full           Yes      Yes                                 +---------+---------------+---------+-----------+----------+--------------+ SFJ      Full                                                        +---------+---------------+---------+-----------+----------+--------------+ FV Prox  Full                                                        +---------+---------------+---------+-----------+----------+--------------+ FV Mid   Full                                                        +---------+---------------+---------+-----------+----------+--------------+ FV DistalFull                                                        +---------+---------------+---------+-----------+----------+--------------+ PFV      Full                                                        +---------+---------------+---------+-----------+----------+--------------+  POP      Full           Yes      Yes                                 +---------+---------------+---------+-----------+----------+--------------+ PTV      Full                                                        +---------+---------------+---------+-----------+----------+--------------+ PERO     Full                                                        +---------+---------------+---------+-----------+----------+--------------+    +---------+---------------+---------+-----------+----------+--------------+ LEFT     CompressibilityPhasicitySpontaneityPropertiesThrombus Aging +---------+---------------+---------+-----------+----------+--------------+ CFV      Full           Yes      Yes                                 +---------+---------------+---------+-----------+----------+--------------+ SFJ      Full                                                        +---------+---------------+---------+-----------+----------+--------------+ FV Prox  Full                                                        +---------+---------------+---------+-----------+----------+--------------+ FV Mid   Full                                                        +---------+---------------+---------+-----------+----------+--------------+ FV DistalFull                                                        +---------+---------------+---------+-----------+----------+--------------+ PFV      Full                                                        +---------+---------------+---------+-----------+----------+--------------+ POP      Full           Yes      Yes                                 +---------+---------------+---------+-----------+----------+--------------+  PTV      Full                                                        +---------+---------------+---------+-----------+----------+--------------+ PERO     Full                                                        +---------+---------------+---------+-----------+----------+--------------+ Poorly visualized bilateral calf veins due to patient body habitus.    Summary: Right: There is no evidence of deep vein thrombosis in the lower extremity. No cystic structure found in the popliteal fossa. Left: There is no evidence of deep vein thrombosis in the lower extremity. No cystic structure found in the popliteal fossa.  *See table(s) above  for measurements and observations. Electronically signed by Deitra Mayo MD on 10/07/2019 at 7:49:47 AM.    Final    IR Paracentesis  Result Date: 10/06/2019 INDICATION: 34 year old female with history of recurrent alcoholic hepatitis presents for diagnostic paracentesis. EXAM: ULTRASOUND GUIDED DIAGNOSTIC PARACENTESIS MEDICATIONS: 1% lidocaine and 5 mL COMPLICATIONS: None immediate. PROCEDURE: Informed written consent was obtained from the patient after a discussion of the risks, benefits and alternatives to treatment. A timeout was performed prior to the initiation of the procedure. Initial ultrasound scanning demonstrates a small amount of ascites within the right lower abdominal quadrant. The right lower abdomen was prepped and draped in the usual sterile fashion. 1% lidocaine was used for local anesthesia. Following this, a 19 gauge, 7-cm, Yueh catheter was introduced. An ultrasound image was saved for documentation purposes. The paracentesis was performed. The catheter was removed and a dressing was applied. The patient tolerated the procedure well without immediate post procedural complication. FINDINGS: A total of approximately 70 mL of amber fluid was removed. Samples were sent to the laboratory as requested by the clinical team. IMPRESSION: Successful ultrasound-guided diagnostic paracentesis yielding 70 mL of peritoneal fluid. Read by Rushie Nyhan NP Electronically Signed   By: Jacqulynn Cadet M.D.   On: 10/06/2019 15:12   Scheduled Meds: . chlorhexidine  15 mL Mouth Rinse BID  . Chlorhexidine Gluconate Cloth  6 each Topical Q0600  . feeding supplement (PRO-STAT SUGAR FREE 64)  30 mL Per Tube Daily  . folic acid  1 mg Intravenous Daily  . lactulose  30 g Per Tube Q6H  . levothyroxine  50 mcg Intravenous Q0600  . mouth rinse  15 mL Mouth Rinse q12n4p  . pantoprazole (PROTONIX) IV  40 mg Intravenous Q12H  . potassium & sodium phosphates  1 packet Oral TID AC & HS  .  prednisoLONE  40 mg Per Tube QAC breakfast  . rifaximin  550 mg Per Tube BID  . thiamine  100 mg Intravenous Daily   Continuous Infusions: . dexmedetomidine (PRECEDEX) IV infusion    . feeding supplement (VITAL 1.5 CAL) 1,000 mL (10/06/19 1754)  . levETIRAcetam Stopped (10/06/19 2320)  . phytonadione (VITAMIN K) IV Stopped (10/06/19 1239)  . piperacillin-tazobactam (ZOSYN)  IV 12.5 mL/hr at 10/07/19 0800  . potassium PHOSPHATE IVPB (in mmol) 85 mL/hr at 10/07/19 0800   PRN Meds:.lidocaine, midazolam  ASSESMENT:   *  Acute liver  failure, alcoholic hepatitis, hepatitis C positive. Fatty liver, hepatomegaly per imaging currently and in past. HCV quantitative, RNA not detected.  Several previous incidences of acute alcoholic hepatitis. Disc fx score 124.   Prednisolone day 2. T bili rising, otherwise LFTs improving. Multiple labs pndg to r/o other causes of liver disease.    *   Acute on chronic thrombocytopenia.  Platelets improved, stable in the 30s.  *    Coagulopathy.  S/p 1 unit platelets.  Day 2/3 of IV vitamin K.  INR 3.5 >> 2.5.  *     Hepatic encephalopathy.  Initially Rx w Lactulose enemas >> now frequent oral lactulose, bid Xifaxan Ammonia 127  *    Hyponatremia.  Improved.  121 >> 126.  *    Anemia, microcytic. So far has received PRBC x2.  Hb improved.  *    Pelvic ascites.    Diagnostic, 70 mL paracentesis on left side 1/26. Total nucleated cells 400, neutrophils 37.  Day 3 Zosyn.   CCM planninig to ask IR to tap the right pelvic complex collection today.    *    COVID-19 negative.  *     Leukosuria, few bacteria, moderate leukocytes, no nitrites.  Urine clx with multiple species, recollection suggested..  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  *    Hypokalemia.  Received potassium phosphate.  Improved but persists.  K3.4.  *   Protein calorie malnutrition, severity/intensity not known but albumin 1.5.  On coretrack tube  feeds..    *    Hyperglycemia.  No recorded history of DM  *    Hx colon cancer 2009.  Latest colonoscopy 2015, poor prep end-to-end ileocolonic anastomosis transverse colon.  *    Polysubstance abuse, depression, schizophrenia.  *    History of alcohol withdrawal seizures and DTs., on Keppra  PLAN   *  Repeat pelvic tap on right side.    *   Agree w reducing dose of lactulose d/w Dr Halford Chessman.    *  Supportive care.      Dawn Foley  10/07/2019, 9:39 AM Phone [phone]

## 2019-10-08 DIAGNOSIS — K7200 Acute and subacute hepatic failure without coma: Principal | ICD-10-CM

## 2019-10-08 DIAGNOSIS — K7201 Acute and subacute hepatic failure with coma: Secondary | ICD-10-CM

## 2019-10-08 DIAGNOSIS — R188 Other ascites: Secondary | ICD-10-CM

## 2019-10-08 DIAGNOSIS — R748 Abnormal levels of other serum enzymes: Secondary | ICD-10-CM

## 2019-10-08 DIAGNOSIS — D689 Coagulation defect, unspecified: Secondary | ICD-10-CM

## 2019-10-08 LAB — CBC
HCT: 25.3 % — ABNORMAL LOW (ref 36.0–46.0)
HCT: 26.7 % — ABNORMAL LOW (ref 36.0–46.0)
Hemoglobin: 8 g/dL — ABNORMAL LOW (ref 12.0–15.0)
Hemoglobin: 8.5 g/dL — ABNORMAL LOW (ref 12.0–15.0)
MCH: 29 pg (ref 26.0–34.0)
MCH: 29 pg (ref 26.0–34.0)
MCHC: 31.6 g/dL (ref 30.0–36.0)
MCHC: 31.8 g/dL (ref 30.0–36.0)
MCV: 91.7 fL (ref 80.0–100.0)
MCV: 91.1 fL (ref 80.0–100.0)
Platelets: 41 10*3/uL — ABNORMAL LOW (ref 150–400)
Platelets: 52 10*3/uL — ABNORMAL LOW (ref 150–400)
RBC: 2.76 MIL/uL — ABNORMAL LOW (ref 3.87–5.11)
RBC: 2.93 MIL/uL — ABNORMAL LOW (ref 3.87–5.11)
RDW: 19 % — ABNORMAL HIGH (ref 11.5–15.5)
RDW: 19.2 % — ABNORMAL HIGH (ref 11.5–15.5)
WBC: 5.8 10*3/uL (ref 4.0–10.5)
WBC: 6.9 10*3/uL (ref 4.0–10.5)
nRBC: 8.8 % — ABNORMAL HIGH (ref 0.0–0.2)
nRBC: 6.8 % — ABNORMAL HIGH (ref 0.0–0.2)

## 2019-10-08 LAB — RENAL FUNCTION PANEL
Albumin: 1.7 g/dL — ABNORMAL LOW (ref 3.5–5.0)
Albumin: 1.7 g/dL — ABNORMAL LOW (ref 3.5–5.0)
Albumin: 1.7 g/dL — ABNORMAL LOW (ref 3.5–5.0)
Albumin: 1.8 g/dL — ABNORMAL LOW (ref 3.5–5.0)
Anion gap: 11 (ref 5–15)
Anion gap: 14 (ref 5–15)
Anion gap: 10 (ref 5–15)
Anion gap: 11 (ref 5–15)
BUN: 5 mg/dL — ABNORMAL LOW (ref 6–20)
BUN: 7 mg/dL (ref 6–20)
BUN: 7 mg/dL (ref 6–20)
BUN: 10 mg/dL (ref 6–20)
CO2: 21 mmol/L — ABNORMAL LOW (ref 22–32)
CO2: 23 mmol/L (ref 22–32)
CO2: 22 mmol/L (ref 22–32)
CO2: 23 mmol/L (ref 22–32)
Calcium: 7.3 mg/dL — ABNORMAL LOW (ref 8.9–10.3)
Calcium: 7.4 mg/dL — ABNORMAL LOW (ref 8.9–10.3)
Calcium: 6.9 mg/dL — ABNORMAL LOW (ref 8.9–10.3)
Calcium: 7 mg/dL — ABNORMAL LOW (ref 8.9–10.3)
Chloride: 100 mmol/L (ref 98–111)
Chloride: 96 mmol/L — ABNORMAL LOW (ref 98–111)
Chloride: 102 mmol/L (ref 98–111)
Chloride: 102 mmol/L (ref 98–111)
Creatinine, Ser: 0.38 mg/dL — ABNORMAL LOW (ref 0.44–1.00)
Creatinine, Ser: 0.32 mg/dL — ABNORMAL LOW (ref 0.44–1.00)
Creatinine, Ser: 0.44 mg/dL (ref 0.44–1.00)
Creatinine, Ser: 0.32 mg/dL — ABNORMAL LOW (ref 0.44–1.00)
GFR calc Af Amer: >60 mL/min (ref >60)
GFR calc Af Amer: >60 mL/min (ref >60)
GFR calc Af Amer: >60 mL/min (ref >60)
GFR calc Af Amer: >60 mL/min (ref >60)
GFR calc non Af Amer: >60 mL/min (ref >60)
GFR calc non Af Amer: >60 mL/min (ref >60)
GFR calc non Af Amer: >60 mL/min (ref >60)
GFR calc non Af Amer: >60 mL/min (ref >60)
Glucose, Bld: 172 mg/dL — ABNORMAL HIGH (ref 70–99)
Glucose, Bld: 127 mg/dL — ABNORMAL HIGH (ref 70–99)
Glucose, Bld: 208 mg/dL — ABNORMAL HIGH (ref 70–99)
Glucose, Bld: 165 mg/dL — ABNORMAL HIGH (ref 70–99)
Phosphorus: 2 mg/dL — ABNORMAL LOW (ref 2.5–4.6)
Phosphorus: 1.3 mg/dL — ABNORMAL LOW (ref 2.5–4.6)
Phosphorus: 3.1 mg/dL (ref 2.5–4.6)
Phosphorus: 2.2 mg/dL — ABNORMAL LOW (ref 2.5–4.6)
Potassium: 3.4 mmol/L — ABNORMAL LOW (ref 3.5–5.1)
Potassium: 4.4 mmol/L (ref 3.5–5.1)
Potassium: 3.9 mmol/L (ref 3.5–5.1)
Potassium: 4.3 mmol/L (ref 3.5–5.1)
Sodium: 132 mmol/L — ABNORMAL LOW (ref 135–145)
Sodium: 133 mmol/L — ABNORMAL LOW (ref 135–145)
Sodium: 134 mmol/L — ABNORMAL LOW (ref 135–145)
Sodium: 136 mmol/L (ref 135–145)

## 2019-10-08 LAB — POCT I-STAT 7, (LYTES, BLD GAS, ICA,H+H)
Acid-Base Excess: 4 mmol/L — ABNORMAL HIGH (ref 0.0–2.0)
Bicarbonate: 26.8 mmol/L (ref 20.0–28.0)
Calcium, Ion: 1.01 mmol/L — ABNORMAL LOW (ref 1.15–1.40)
HCT: 27 % — ABNORMAL LOW (ref 36.0–46.0)
Hemoglobin: 9.2 g/dL — ABNORMAL LOW (ref 12.0–15.0)
O2 Saturation: 97 %
Potassium: 3.6 mmol/L (ref 3.5–5.1)
Sodium: 135 mmol/L (ref 135–145)
TCO2: 28 mmol/L (ref 22–32)
pCO2 arterial: 30.7 mmHg — ABNORMAL LOW (ref 32.0–48.0)
pH, Arterial: 7.549 — ABNORMAL HIGH (ref 7.350–7.450)
pO2, Arterial: 77 mmHg — ABNORMAL LOW (ref 83.0–108.0)

## 2019-10-08 LAB — PROTEIN ELECTROPHORESIS, SERUM
A/G Ratio: 0.6 — ABNORMAL LOW (ref 0.7–1.7)
Albumin ELP: 2.3 g/dL — ABNORMAL LOW (ref 2.9–4.4)
Alpha-1-Globulin: 0.2 g/dL (ref 0.0–0.4)
Alpha-2-Globulin: 0.5 g/dL (ref 0.4–1.0)
Beta Globulin: 0.8 g/dL (ref 0.7–1.3)
Gamma Globulin: 2.1 g/dL — ABNORMAL HIGH (ref 0.4–1.8)
Globulin, Total: 3.7 g/dL (ref 2.2–3.9)
Total Protein ELP: 6 g/dL (ref 6.0–8.5)

## 2019-10-08 LAB — T4, FREE: Free T4: 0.77 ng/dL (ref 0.61–1.12)

## 2019-10-08 LAB — HEPATIC FUNCTION PANEL
ALT: 101 U/L — ABNORMAL HIGH (ref 0–44)
AST: 464 U/L — ABNORMAL HIGH (ref 15–41)
Albumin: 1.7 g/dL — ABNORMAL LOW (ref 3.5–5.0)
Alkaline Phosphatase: 130 U/L — ABNORMAL HIGH (ref 38–126)
Bilirubin, Direct: 17.4 mg/dL — ABNORMAL HIGH (ref 0.0–0.2)
Indirect Bilirubin: 9.7 mg/dL — ABNORMAL HIGH (ref 0.3–0.9)
Total Bilirubin: 27.1 mg/dL (ref 0.3–1.2)
Total Protein: 6.5 g/dL (ref 6.5–8.1)

## 2019-10-08 LAB — EPSTEIN BARR VRS(EBV DNA BY PCR)
EBV DNA QN by PCR: POSITIVE copies/mL
log10 EBV DNA Qn PCR: UNDETERMINED log10 copy/mL

## 2019-10-08 LAB — PH, BODY FLUID: pH, Body Fluid: 8

## 2019-10-08 LAB — MAGNESIUM: Magnesium: 1.6 mg/dL — ABNORMAL LOW (ref 1.7–2.4)

## 2019-10-08 LAB — SOLUBLE LIVER AG (IGG AB): Anti-SLA, IgG: 15.5 units (ref 0.0–20.0)

## 2019-10-08 LAB — GLUCOSE, CAPILLARY
Glucose-Capillary: 131 mg/dL — ABNORMAL HIGH (ref 70–99)
Glucose-Capillary: 143 mg/dL — ABNORMAL HIGH (ref 70–99)
Glucose-Capillary: 160 mg/dL — ABNORMAL HIGH (ref 70–99)
Glucose-Capillary: 161 mg/dL — ABNORMAL HIGH (ref 70–99)
Glucose-Capillary: 165 mg/dL — ABNORMAL HIGH (ref 70–99)
Glucose-Capillary: 184 mg/dL — ABNORMAL HIGH (ref 70–99)
Glucose-Capillary: 198 mg/dL — ABNORMAL HIGH (ref 70–99)

## 2019-10-08 LAB — PROCALCITONIN: Procalcitonin: 1.08 ng/mL

## 2019-10-08 LAB — PROTIME-INR
INR: 2.2 — ABNORMAL HIGH (ref 0.8–1.2)
Prothrombin Time: 24 seconds — ABNORMAL HIGH (ref 11.4–15.2)

## 2019-10-08 LAB — CYTOLOGY - NON PAP

## 2019-10-08 LAB — ANTI-MICROSOMAL ANTIBODY LIVER / KIDNEY: LKM1 Ab: 2.1 Units (ref 0.0–20.0)

## 2019-10-08 LAB — ANTI-SMOOTH MUSCLE ANTIBODY, IGG: F-Actin IgG: 12 Units (ref 0–19)

## 2019-10-08 LAB — PATHOLOGIST SMEAR REVIEW

## 2019-10-08 LAB — AMMONIA: Ammonia: 163 umol/L — ABNORMAL HIGH (ref 9–35)

## 2019-10-08 LAB — VARICELLA ZOSTER ANTIBODY, IGM: Varicella-Zoster Ab, IgM: <0.91 index (ref 0.00–0.90)

## 2019-10-08 MED ORDER — LACTULOSE 10 GM/15ML PO SOLN
30.0000 g | Freq: Three times a day (TID) | ORAL | Status: DC
  Administered 2019-10-08 – 2019-10-13 (×16): 30 g
  Filled 2019-10-08 (×15): qty 45

## 2019-10-08 MED ORDER — MAGNESIUM SULFATE 2 GM/50ML IV SOLN
2.0000 g | Freq: Once | INTRAVENOUS | Status: AC
  Administered 2019-10-08: 2 g via INTRAVENOUS
  Filled 2019-10-08: qty 50

## 2019-10-08 MED ORDER — POTASSIUM CHLORIDE 20 MEQ/15ML (10%) PO SOLN
20.0000 meq | ORAL | Status: AC
  Administered 2019-10-08 (×2): 20 meq
  Filled 2019-10-08 (×2): qty 15

## 2019-10-08 MED ORDER — WHITE PETROLATUM EX OINT
TOPICAL_OINTMENT | CUTANEOUS | Status: AC
  Filled 2019-10-08: qty 28.35

## 2019-10-08 MED ORDER — SODIUM PHOSPHATES 45 MMOLE/15ML IV SOLN
30.0000 mmol | Freq: Once | INTRAVENOUS | Status: AC
  Administered 2019-10-08: 30 mmol via INTRAVENOUS
  Filled 2019-10-08: qty 10

## 2019-10-08 NOTE — Progress Notes (Signed)
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 Workup consistent with acute liver failure with multiple metabolic derangements and coagulopathies transferred to [facility] for higher level of care.     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 Severe hepatic steatosis and hepatomegaly 2. Moderate to large amount of free fluid within the pelvis extending into the right abdomen which may be mildly complicated/ complex  [id number] RUQ [place] >> 1. Gallbladder sludge.  No sonographic evidence for acute cholecystitis or bilary dilation.  2. Enlarged echogenic liver suggesting fatty inflitration  Micro Data:  1/26 BC x2 >> 1/26 UC  >>  Antimicrobials:  1/26 zosyn >>  Interim history/subjective:  Worsening mental status overnight, increased ammonia level  Objective   Blood pressure 109/73, pulse (!) 120, temperature 99.7 F (37.6 C), resp. rate 12, height 5\' 3"  (1.6 m), weight 88.1 kg, SpO2 98 %.        Intake/Output Summary (Last 24 hours) at 10/08/2019 0729 Last data filed at 10/08/2019 0600 Gross per 24 hour  Intake 3036.6 ml  Output 2695 ml  Net 341.6 ml   Filed Weights   10/05/19 2335 10/07/19 0500 10/08/19 0500  Weight: 85.2 kg 89 kg 88.1 kg    Examination: General:  Ill appearing young WF in NAD HEENT: pupils 4/reactive, scleral icterus  Neuro: staring, only occasionally will track with eyes, grunting, not following commands, grimaces to pain but does not withdrawal. Muscle tone appreciated on passive movement of extremities CV: tachycardic with regular rhythm, no MRG PULM:   CTAB, mild increase in work of breathing compared to yesterday GI: protuberant, prominent hepatomegaly, soft, no rebound tenderness Extremities: warm/dry, +2-3 generalized edema  Skin: jaundiced  Resolved Hospital Problem list     Assessment & Plan:   Acute liver failure   Hx Hep C (do not see that she has been treated), Chronic heavy ETOH abuse, MELD score 33- with 3 month 52.6% mortality rate.  acetaminophen, HIV, cortisol, acute hepatitis panel, CMV, EBV, HSV, varicella, CK, ANA, antimitochondrial ab, anti-smooth muscle antibody.  GI repeated these and added MM panel and IgG.  So far Hep A, B, neg, hep c reactive ab but no active infection, EBV labs suggest prior infection but not active.  ANA and ribonucleoprotein returned positive.  Paracentesis of left side of abdomen performed 1/26 SAAG ratio low 0.8.  Ammonia and mental  status worse 1/28 P:  GI following receiving prednisolone will see if she needs dose adjustment given lab findings Continue empiric zosyn  Continue to follow up Acute liver failure workup listed  above Trend hepatic function/coags/ammonia/ - daily  Continue lactulose    Encephalopathy- hepatic and metabolic  High risk for DTs, unclear last ETOH use (ETOH neg 1/25) Hx alcohol related seizures.  Severe anemia, hyponatremia, also contributing Ammonia level at [facility] 133 Bilirubin remains extremely elevated P:  Serial neuro exams Ammonia worsening and lactulose was increased ICU CIWA, precedex if needed versed prn  Seizure precautions Keppra BID IV  Daily thiamine/ folate IV   Severe Anemia, Thrombocytopenia - severe thrombocytopenia, anemia, INR 4 likely related to liver failure , seems to be mixture of chronic blood loss anemia and bone marrow suppression from alcohol use.  OSH labs noted for B12 > 1000, folate and TIBC normal, iron low 19 with low saturation of 6.2%, ferritin 326 hemoccult negative.  Has received 4 u PRBC's and 1FFP/1Platelets as of 1/26 am.   P:  Hgb stable confirming chronic losses, transfuse for Hgb >7 Serial hgb checks PPI BID Sepsis workup as below   High risk for airway compromise related to encephalopathy  P:  Monitor  Supplemental O2 prn    Right sided Pelvic/abdominal complex/ complicated fluid collection Small volume ascites does not seem to communicate with pelvic fluid. Left sided abdominal paracentesis performed 1/26 70cc fluid removed.   P:  Follow up cultures ngtd Empiric zosyn for now, de-escalate as able  Check/ trend PCT  Trend WBC/ fever curve   Hyponatremia At risk for AKI P:  Q6 RFP- adjust fluids as needed to avoid rapid rise has been slow desired correction so far Continue foley/ strict I/Os   Severe malnutrition, Hypoglycemia likely related to liver dysfunction and alcohol use disorder.  At risk for refeeding syndrome P:  Serial cbg's SSI Tube feeds Aggressive phos replacement    Hx depression, polysubstance abuse, schizophrenia P:  Holding home haldol, seroquel, trazadone with  encephalopathy  Hypothyroidism: TSH elevated to 12 P:  Continue synthroid, dosage adequate given normalization of free T4   Best practice:  Diet: Tube feeds Pain/Anxiety/Delirium protocol (if indicated): CIWA ICU VAP protocol (if indicated): n/a DVT prophylaxis: SCD GI prophylaxis: PPI BID Glucose control: cbg monitoring Mobility: BR Code Status: Full  Family Communication: mother updated by phone  Disposition: ICU  Labs   CBC: Recent Labs  Lab 10/06/19 0020 10/06/19 0020 10/06/19 0851 10/06/19 0851 10/06/19 1514 10/07/19 0001 10/07/19 0043 10/07/19 1500 10/08/19 0134  WBC 4.8   < > 3.4*  --  3.1*  --  4.9 4.0 5.8  NEUTROABS 3.8  --   --   --   --   --   --   --   --   HGB 5.9*   < > 7.5*  --  7.7*  --  8.1* 7.5* 8.0*  HCT 21.0*  27.6*   < > 22.9*  --  24.0*  --  25.4* 23.3* 25.3*  MCV 99.1   < > 88.8  --  88.9  --  90.7 88.6 91.7  PLT 17*   < > 34*   < > 33* 39* 39* 43* 41*   < > = values in this interval not displayed.    Basic Metabolic Panel: Recent Labs  Lab 10/06/19 0614 10/06/19 0851 10/07/19 0001 10/07/19 0437 10/07/19 1500 10/07/19 1929 10/08/19 0134  NA 123*  123*   < >  126* 126* 130* 131* 132*  K 3.4*   < > 3.4* 3.4* 3.7 3.3* 3.4*  CL 89*   < > 92* 93* 98 98 100  CO2 16*   < > 23 21* 22 22 21*  GLUCOSE 94   < > 138* 156* 199* 213* 172*  BUN 6   < > 6 5* 5* <5* 5*  CREATININE 0.57   < > 0.46 0.40* 0.32* 0.41* 0.38*  CALCIUM 8.2*   < > 7.7* 7.7* 7.4* 7.3* 7.3*  MG 2.0  --   --  1.7  --   --  1.6*  PHOS 1.1*   < > 2.0* <1.0* 2.1* 1.4* 2.0*   < > = values in this interval not displayed.   GFR: Estimated Creatinine Clearance: 106.3 mL/min (A) (by C-G formula based on SCr of 0.38 mg/dL (L)). Recent Labs  Lab 10/06/19 0020 10/06/19 0851 10/06/19 1514 10/07/19 0043 10/07/19 0437 10/07/19 1500 10/08/19 0134  PROCALCITON 1.24  --   --   --  1.64  --  1.08  WBC 4.8   < > 3.1* 4.9  --  4.0 5.8   < > = values in this interval not displayed.     Liver Function Tests: Recent Labs  Lab 10/06/19 0020 10/06/19 0020 10/06/19 0851 10/06/19 1314 10/07/19 0001 10/07/19 0437 10/07/19 1500 10/07/19 1929 10/08/19 0134  AST 1,663*  --  1,588*  --   --  970*  --   --  464*  ALT 184*  --  148*  --   --  127*  --   --  101*  ALKPHOS 132*  --  120  --   --  113  --   --  130*  BILITOT 23.7*  --  25.0*  --   --  27.2*  --   --  27.1*  PROT 7.1  --  6.9  --   --  6.7  --   --  6.5  ALBUMIN 2.1*   < > 2.1*  2.1*   < > 2.0* 1.8* 1.8* 1.7* 1.7*  1.7*   < > = values in this interval not displayed.   Recent Labs  Lab 10/06/19 0020 10/06/19 0851  LIPASE 49  --   AMYLASE  --  79   Recent Labs  Lab 10/07/19 0428 10/08/19 0134  AMMONIA 127* 163*    ABG    Component Value Date/Time   PHART 7.448 09/04/2016 0329   PCO2ART 43.8 09/04/2016 0329   PO2ART 75.8 (L) 09/04/2016 0329   HCO3 29.9 (H) 09/04/2016 0329   O2SAT 94.4 09/04/2016 0329     Coagulation Profile: Recent Labs  Lab 10/06/19 0020 10/07/19 0001 10/07/19 0437 10/08/19 0134  INR 3.5* 2.6* 2.5* 2.2*    Cardiac Enzymes: Recent Labs  Lab 10/06/19 0025  CKTOTAL 134    HbA1C: Hgb A1c MFr Bld  Date/Time Value Ref Range Status  08/28/2018 08:54 AM 4.9 4.8 - 5.6 % Final    Comment:    (NOTE) Pre diabetes:          5.7%-6.4% Diabetes:              >6.4% Glycemic control for   <7.0% adults with diabetes   11/06/2017 06:38 AM 4.7 (L) 4.8 - 5.6 % Final    Comment:    (NOTE) Pre diabetes:          5.7%-6.4% Diabetes:              >  6.4% Glycemic control for   <7.0% adults with diabetes     CBG: Recent Labs  Lab 10/07/19 1943 10/07/19 2344 10/08/19 0141 10/08/19 0334 10/08/19 0706  GLUCAP 209* 177* 184* 165* 131*    Review of Systems:   Unable to complete due to pt encephalopathy  Past Medical History  She,  has a past medical history of Alcoholism ([place]), Anemia, Anxiety, Arthritis, Asthma, Bipolar disorder ([place]), Cancer ([place]), Chronic  kidney disease, Complication of anesthesia, Depression, Elevated liver enzymes, GERD (gastroesophageal reflux disease), H/O alcohol abuse, Heart murmur, Hepatitis, Hepatitis C, Hypothyroidism, Mallory-Weiss tear, Nicotine dependence, Schizophrenia (HCC), Seizures (HCC), Thrombocytopenia (HCC), Tibial plateau fracture, right, and Upper GI bleed.   Surgical History    Past Surgical History:  Procedure Laterality Date  . APPENDECTOMY    . DILATION AND CURETTAGE OF UTERUS    . ESOPHAGOGASTRODUODENOSCOPY (EGD) WITH PROPOFOL N/A 09/04/2016   Procedure: ESOPHAGOGASTRODUODENOSCOPY (EGD) WITH PROPOFOL;  Surgeon: Doran Stabler, MD;  Location: [place];  Service: Endoscopy;  Laterality: N/A;  . HARDWARE REMOVAL Right 06/18/2017   Procedure: REMOVAL RIGHT EXTERNAL FIXATOR;  Surgeon: Altamese Bryant, MD;  Location: [place];  Service: Orthopedics;  Laterality: Right;  . HERNIA REPAIR     umbilical hernia  . IR PARACENTESIS  10/06/2019  . KNEE CLOSED REDUCTION Right 06/18/2017   Procedure: CLOSED MANIPULATION UNDER ANESTHESIA RIGHT KNEE;  Surgeon: Altamese St. Francis, MD;  Location: [place];  Service: Orthopedics;  Laterality: Right;  . ORIF FEMUR FRACTURE Right 07/18/2017   Procedure: REPAIR NONUNION WITH RIA;  Surgeon: Altamese Gahanna, MD;  Location: [place];  Service: Orthopedics;  Laterality: Right;  . ORIF TIBIA PLATEAU       Social History   reports that she has been smoking cigarettes. She has been smoking about 1.00 pack per day. She has never used smokeless tobacco. She reports current alcohol use of about 16.0 standard drinks of alcohol per week. She reports that she does not use drugs.   Family History   Her family history includes Alcoholism in her brother; Mental illness in an other family member; Thyroid disease in an other family member.   Allergies Allergies  Allergen Reactions  . Ondansetron Nausea And Vomiting  . Fish Allergy Nausea And Vomiting  . Grapeseed Extract [Nutritional  Supplements] Hives  . Other Rash    Polyester     Home Medications  Prior to Admission medications   Medication Sig Start Date End Date Taking? Authorizing Provider  haloperidol (HALDOL) 10 MG tablet Take 10 mg by mouth 3 (three) times daily.    [provider]  levETIRAcetam (KEPPRA) 500 MG tablet Take 1 tablet (500 mg total) by mouth 2 (two) times daily. 10/27/18   Johnn Hai, MD  levothyroxine (SYNTHROID, LEVOTHROID) 100 MCG tablet Take 1 tablet (100 mcg total) by mouth daily at 6 (six) AM. 09/02/18   Clapacs, Madie Reno, MD  naphazoline-glycerin (CLEAR EYES REDNESS) 0.012-0.2 % SOLN Place 1-2 drops into both eyes 4 (four) times daily as needed for eye irritation.    [provider]  nicotine (NICODERM CQ - DOSED IN MG/24 HOURS) 21 mg/24hr patch Place 21 mg onto the skin daily.    [provider]  QUEtiapine (SEROQUEL) 300 MG tablet Take 300 mg by mouth 3 (three) times daily.    [provider]  Skin Protectants, Misc. (EUCERIN) cream Apply 1 application topically as needed for dry skin.    [provider]  traZODone (DESYREL) 50 MG tablet  Take 50 mg by mouth at bedtime. 10/17/18   [provider]     Critical care time: 70 mins      Vickki Muff MD PGY-3 Internal Medicine Pager # [phone]

## 2019-10-08 NOTE — Progress Notes (Signed)
Progress Note   Subjective  Chief Complaint: Acute on chronic liver disease  Nursing staff reports low-grade fevers overnight, decrease in mental status now only responding to pain and opening her eyes to her name, no longer trying to speak.  Remains jaundiced.  Heart rate regular, tachycardic.  1+ edema.   Objective   Vital signs in last 24 hours: Temp:  [99.1 F (37.3 C)-100.2 F (37.9 C)] 99.3 F (37.4 C) (01/28 1030) Pulse Rate:  [107-120] 118 (01/28 1030) Resp:  [10-17] 14 (01/28 1030) BP: (106-143)/(68-89) 109/79 (01/28 1030) SpO2:  [96 %-99 %] 97 % (01/28 1030) Weight:  [88.1 kg] 88.1 kg (01/28 0500) Last BM Date: 10/07/19 General:    Jaundiced, ill-appearing, white female in NAD Heart: Tachycardic, regular rhythm; no murmurs Lungs: Respirations even and unlabored, lungs CTA bilaterally Abdomen:  Soft, moderate abdominal distention, prominent hepatomegaly Extremities: +2-3 generalized edema Neurologic: Staring only, opens eyes to name, not following commands  Intake/Output from previous day: 01/27 0701 - 01/28 0700 In: 3108.6 [I.V.:40; NG/GT:1500; IV Piggyback:1568.7] Out: 3245 [Urine:1545; Stool:1700] Intake/Output this shift: Total I/O In: 260.6 [NG/GT:180; IV Piggyback:80.6] Out: 100 [Urine:100]  Lab Results: Recent Labs    10/07/19 0043 10/07/19 0043 10/07/19 1500 10/08/19 0134 10/08/19 0817  WBC 4.9  --  4.0 5.8  --   HGB 8.1*   < > 7.5* 8.0* 9.2*  HCT 25.4*   < > 23.3* 25.3* 27.0*  PLT 39*  --  43* 41*  --    < > = values in this interval not displayed.   BMET Recent Labs    10/07/19 1929 10/07/19 1929 10/08/19 0134 10/08/19 0710 10/08/19 0817  NA 131*   < > 132* 133* 135  K 3.3*   < > 3.4* 4.4 3.6  CL 98  --  100 96*  --   CO2 22  --  21* 23  --   GLUCOSE 213*  --  172* 127*  --   BUN <5*  --  5* 7  --   CREATININE 0.41*  --  0.38* 0.32*  --   CALCIUM 7.3*  --  7.3* 7.4*  --    < > = values in this interval not displayed.    LFT Recent Labs    10/08/19 0134 10/08/19 0134 10/08/19 0710  PROT 6.5  --   --   ALBUMIN 1.7*  1.7*   < > 1.7*  AST 464*  --   --   ALT 101*  --   --   ALKPHOS 130*  --   --   BILITOT 27.1*  --   --   BILIDIR 17.4*  --   --   IBILI 9.7*  --   --    < > = values in this interval not displayed.   PT/INR Recent Labs    10/07/19 0437 10/08/19 0134  LABPROT 27.3* 24.0*  INR 2.5* 2.2*    Studies/Results: US PELVIS LIMITED (TRANSABDOMINAL ONLY)  Result Date: 10/06/2019 CLINICAL DATA:  Ascites.  Abnormal CT EXAM: LIMITED ULTRASOUND OF PELVIS TECHNIQUE: Limited transabdominal ultrasound examination of the pelvis was performed. COMPARISON:  CT 10/05/2019 FINDINGS: Within the right lower quadrant there is a somewhat lobulated fluid collection measuring 8.0 x 4.5 x 8.5 cm. This collection does not appear freely communicating with the small volume ascites within the abdomen and pelvis. IMPRESSION: Small volume abdominopelvic ascites with a complex appearing fluid collection within the right lower quadrant which does not appear to  directly communicate with the free fluid. Findings could represent a loculated component of ascites versus an intrapelvic abscess. Electronically Signed   By: Davina Poke D.O.   On: 10/06/2019 11:43   VAS [place] LOWER EXTREMITY VENOUS (DVT)  Result Date: 10/07/2019  Lower Venous Study Indications: Swelling.  Risk Factors: Cancer Colon 2009. Comparison Study: Right LEV 06-13-18, negative. Performing Technologist: Baldwin Crown ARDMS, RVT  Examination Guidelines: A complete evaluation includes B-mode imaging, spectral Doppler, color Doppler, and power Doppler as needed of all accessible portions of each vessel. Bilateral testing is considered an integral part of a complete examination. Limited examinations for reoccurring indications may be performed as noted.  +---------+---------------+---------+-----------+----------+--------------+ RIGHT     CompressibilityPhasicitySpontaneityPropertiesThrombus Aging +---------+---------------+---------+-----------+----------+--------------+ CFV      Full           Yes      Yes                                 +---------+---------------+---------+-----------+----------+--------------+ SFJ      Full                                                        +---------+---------------+---------+-----------+----------+--------------+ FV Prox  Full                                                        +---------+---------------+---------+-----------+----------+--------------+ FV Mid   Full                                                        +---------+---------------+---------+-----------+----------+--------------+ FV DistalFull                                                        +---------+---------------+---------+-----------+----------+--------------+ PFV      Full                                                        +---------+---------------+---------+-----------+----------+--------------+ POP      Full           Yes      Yes                                 +---------+---------------+---------+-----------+----------+--------------+ PTV      Full                                                        +---------+---------------+---------+-----------+----------+--------------+  PERO     Full                                                        +---------+---------------+---------+-----------+----------+--------------+   +---------+---------------+---------+-----------+----------+--------------+ LEFT     CompressibilityPhasicitySpontaneityPropertiesThrombus Aging +---------+---------------+---------+-----------+----------+--------------+ CFV      Full           Yes      Yes                                 +---------+---------------+---------+-----------+----------+--------------+ SFJ      Full                                                         +---------+---------------+---------+-----------+----------+--------------+ FV Prox  Full                                                        +---------+---------------+---------+-----------+----------+--------------+ FV Mid   Full                                                        +---------+---------------+---------+-----------+----------+--------------+ FV DistalFull                                                        +---------+---------------+---------+-----------+----------+--------------+ PFV      Full                                                        +---------+---------------+---------+-----------+----------+--------------+ POP      Full           Yes      Yes                                 +---------+---------------+---------+-----------+----------+--------------+ PTV      Full                                                        +---------+---------------+---------+-----------+----------+--------------+ PERO     Full                                                        +---------+---------------+---------+-----------+----------+--------------+  Poorly visualized bilateral calf veins due to patient body habitus.    Summary: Right: There is no evidence of deep vein thrombosis in the lower extremity. No cystic structure found in the popliteal fossa. Left: There is no evidence of deep vein thrombosis in the lower extremity. No cystic structure found in the popliteal fossa.  *See table(s) above for measurements and observations. Electronically signed by Deitra Mayo MD on 10/07/2019 at 7:49:47 AM.    Final    IR Paracentesis  Result Date: 10/06/2019 INDICATION: 34 year old female with history of recurrent alcoholic hepatitis presents for diagnostic paracentesis. EXAM: ULTRASOUND GUIDED DIAGNOSTIC PARACENTESIS MEDICATIONS: 1% lidocaine and 5 mL COMPLICATIONS: None immediate. PROCEDURE: Informed written consent was  obtained from the patient after a discussion of the risks, benefits and alternatives to treatment. A timeout was performed prior to the initiation of the procedure. Initial ultrasound scanning demonstrates a small amount of ascites within the right lower abdominal quadrant. The right lower abdomen was prepped and draped in the usual sterile fashion. 1% lidocaine was used for local anesthesia. Following this, a 19 gauge, 7-cm, Yueh catheter was introduced. An ultrasound image was saved for documentation purposes. The paracentesis was performed. The catheter was removed and a dressing was applied. The patient tolerated the procedure well without immediate post procedural complication. FINDINGS: A total of approximately 70 mL of amber fluid was removed. Samples were sent to the laboratory as requested by the clinical team. IMPRESSION: Successful ultrasound-guided diagnostic paracentesis yielding 70 mL of peritoneal fluid. Read by Rushie Nyhan NP Electronically Signed   By: Jacqulynn Cadet M.D.   On: 10/06/2019 15:12       Assessment / Plan:    Assessment: 1.  Acute liver failure, alcoholic hepatitis, hep C positive: Fatty liver, hepatomegaly for imaging currently and in the past, HCV qualitative, RNA not detected, several previous incidence of alcohol hepatitis, discriminant function score 124, prednisolone day 3, T bili holding steady today at 27.1 (27.2 yesterday), otherwise LFTs improving 2.  Acute on chronic thrombocytopenia 3.  Coagulopathy 4.  Hepatic encephalopathy 5.  Hyponatremia 6.  Macrocytic anemia 7.  Pelvic ascites 8.  Leukosuria 9.  Mallory-Weiss tear with coffee-ground emesis on 08/2016 EGD 10.  Protein calorie malnutrition 11.  History of colon cancer 2009: Last colonoscopy 2015, previous end-to-end ileocolonic anastomosis transverse colon 12.  Polysubstance abuse 13.  History of alcohol withdrawal seizures and DTs  Plan: 1.  Supportive care 2.  Please await further  recommendations from Dr. Tarri Glenn later today   LOS: 3 days   Levin Erp  10/08/2019, 10:46 AM

## 2019-10-08 NOTE — Progress Notes (Signed)
eLink Physician-Brief Progress Note Patient Name: Dawn Foley DOB: 02/12/1986 MRN: [id number]   Date of Service  10/08/2019  HPI/Events of Note  Ammonia level increased to 163, Pt is already on Rifaximin.  eICU Interventions  Lactulose increased to 30 gm tid        Frederik Pear 10/08/2019, 4:45 AM

## 2019-10-08 NOTE — Progress Notes (Addendum)
Elink Adult Replacement Protocol For AM Labs  GFR > 60 UOP is 5.3 ml/kg/hr BUN is 5 Abnormal electrolytes: K+ 3.4, Mag+ 1.6 Ordered repletion with Potassium Chloride 40 meq per tube every 4 hours x2 doses, Magnesium 2 grams IV

## 2019-10-09 DIAGNOSIS — K7011 Alcoholic hepatitis with ascites: Secondary | ICD-10-CM

## 2019-10-09 LAB — ENA+DNA/DS+ANTICH+CENTRO+JO...
Anti JO-1: <0.2 AI (ref 0.0–0.9)
Centromere Ab Screen: <0.2 AI (ref 0.0–0.9)
Chromatin Ab SerPl-aCnc: 0.6 AI (ref 0.0–0.9)
ENA SM Ab Ser-aCnc: <0.2 AI (ref 0.0–0.9)
Ribonucleic Protein: >8 AI — ABNORMAL HIGH (ref 0.0–0.9)
SSA (Ro) (ENA) Antibody, IgG: <0.2 AI (ref 0.0–0.9)
SSB (La) (ENA) Antibody, IgG: <0.2 AI (ref 0.0–0.9)
Scleroderma (Scl-70) (ENA) Antibody, IgG: <0.2 AI (ref 0.0–0.9)
ds DNA Ab: <1 IU/mL (ref 0–9)

## 2019-10-09 LAB — HEPATIC FUNCTION PANEL
ALT: 64 U/L — ABNORMAL HIGH (ref 0–44)
AST: 185 U/L — ABNORMAL HIGH (ref 15–41)
Albumin: 1.5 g/dL — ABNORMAL LOW (ref 3.5–5.0)
Alkaline Phosphatase: 123 U/L (ref 38–126)
Bilirubin, Direct: 15.5 mg/dL — ABNORMAL HIGH (ref 0.0–0.2)
Indirect Bilirubin: 9.7 mg/dL — ABNORMAL HIGH (ref 0.3–0.9)
Total Bilirubin: 25.2 mg/dL (ref 0.3–1.2)
Total Protein: 5.7 g/dL — ABNORMAL LOW (ref 6.5–8.1)

## 2019-10-09 LAB — RENAL FUNCTION PANEL
Albumin: 1.6 g/dL — ABNORMAL LOW (ref 3.5–5.0)
Albumin: 1.6 g/dL — ABNORMAL LOW (ref 3.5–5.0)
Anion gap: 9 (ref 5–15)
Anion gap: 9 (ref 5–15)
BUN: 13 mg/dL (ref 6–20)
BUN: 15 mg/dL (ref 6–20)
CO2: 25 mmol/L (ref 22–32)
CO2: 24 mmol/L (ref 22–32)
Calcium: 7 mg/dL — ABNORMAL LOW (ref 8.9–10.3)
Calcium: 6.8 mg/dL — ABNORMAL LOW (ref 8.9–10.3)
Chloride: 105 mmol/L (ref 98–111)
Chloride: 108 mmol/L (ref 98–111)
Creatinine, Ser: 0.51 mg/dL (ref 0.44–1.00)
Creatinine, Ser: 0.52 mg/dL (ref 0.44–1.00)
GFR calc Af Amer: >60 mL/min (ref >60)
GFR calc Af Amer: >60 mL/min (ref >60)
GFR calc non Af Amer: >60 mL/min (ref >60)
GFR calc non Af Amer: >60 mL/min (ref >60)
Glucose, Bld: 186 mg/dL — ABNORMAL HIGH (ref 70–99)
Glucose, Bld: 230 mg/dL — ABNORMAL HIGH (ref 70–99)
Phosphorus: 2.3 mg/dL — ABNORMAL LOW (ref 2.5–4.6)
Phosphorus: 4.3 mg/dL (ref 2.5–4.6)
Potassium: 3.1 mmol/L — ABNORMAL LOW (ref 3.5–5.1)
Potassium: 3.3 mmol/L — ABNORMAL LOW (ref 3.5–5.1)
Sodium: 139 mmol/L (ref 135–145)
Sodium: 141 mmol/L (ref 135–145)

## 2019-10-09 LAB — GLUCOSE, CAPILLARY
Glucose-Capillary: 163 mg/dL — ABNORMAL HIGH (ref 70–99)
Glucose-Capillary: 166 mg/dL — ABNORMAL HIGH (ref 70–99)
Glucose-Capillary: 193 mg/dL — ABNORMAL HIGH (ref 70–99)
Glucose-Capillary: 240 mg/dL — ABNORMAL HIGH (ref 70–99)
Glucose-Capillary: 203 mg/dL — ABNORMAL HIGH (ref 70–99)
Glucose-Capillary: 160 mg/dL — ABNORMAL HIGH (ref 70–99)

## 2019-10-09 LAB — CBC
HCT: 25.7 % — ABNORMAL LOW (ref 36.0–46.0)
Hemoglobin: 7.8 g/dL — ABNORMAL LOW (ref 12.0–15.0)
MCH: 28.5 pg (ref 26.0–34.0)
MCHC: 30.4 g/dL (ref 30.0–36.0)
MCV: 93.8 fL (ref 80.0–100.0)
Platelets: 64 10*3/uL — ABNORMAL LOW (ref 150–400)
RBC: 2.74 MIL/uL — ABNORMAL LOW (ref 3.87–5.11)
RDW: 21 % — ABNORMAL HIGH (ref 11.5–15.5)
WBC: 7.7 10*3/uL (ref 4.0–10.5)
nRBC: 10.7 % — ABNORMAL HIGH (ref 0.0–0.2)

## 2019-10-09 LAB — IRON AND TIBC
Iron: 14 ug/dL — ABNORMAL LOW (ref 28–170)
Saturation Ratios: 6 % — ABNORMAL LOW (ref 10.4–31.8)
TIBC: 242 ug/dL — ABNORMAL LOW (ref 250–450)
UIBC: 228 ug/dL

## 2019-10-09 LAB — AMMONIA: Ammonia: 91 umol/L — ABNORMAL HIGH (ref 9–35)

## 2019-10-09 LAB — PROTIME-INR
INR: 1.9 — ABNORMAL HIGH (ref 0.8–1.2)
Prothrombin Time: 21.6 seconds — ABNORMAL HIGH (ref 11.4–15.2)

## 2019-10-09 LAB — FERRITIN: Ferritin: 161 ng/mL (ref 11–307)

## 2019-10-09 LAB — ANA W/REFLEX IF POSITIVE: Anti Nuclear Antibody (ANA): POSITIVE — AB

## 2019-10-09 LAB — PROCALCITONIN: Procalcitonin: 1.02 ng/mL

## 2019-10-09 LAB — T4, FREE: Free T4: 1.03 ng/dL (ref 0.61–1.12)

## 2019-10-09 LAB — MAGNESIUM: Magnesium: 2 mg/dL (ref 1.7–2.4)

## 2019-10-09 MED ORDER — POTASSIUM PHOSPHATES 15 MMOLE/5ML IV SOLN
30.0000 mmol | Freq: Once | INTRAVENOUS | Status: AC
  Administered 2019-10-09: 30 mmol via INTRAVENOUS
  Filled 2019-10-09 (×2): qty 10

## 2019-10-09 MED ORDER — DEXMEDETOMIDINE HCL IN NACL 400 MCG/100ML IV SOLN
0.4000 ug/kg/h | INTRAVENOUS | Status: DC
  Administered 2019-10-09 (×2): 0.4 ug/kg/h via INTRAVENOUS
  Administered 2019-10-10: 02:00:00 0.7 ug/kg/h via INTRAVENOUS
  Filled 2019-10-09 (×3): qty 100

## 2019-10-09 MED ORDER — SODIUM PHOSPHATES 45 MMOLE/15ML IV SOLN
30.0000 mmol | Freq: Once | INTRAVENOUS | Status: AC
  Administered 2019-10-09: 30 mmol via INTRAVENOUS
  Filled 2019-10-09: qty 10

## 2019-10-09 NOTE — Progress Notes (Signed)
Per CCM do not prevent/treat any fevers at this time.  If temp sustains over 101 notify MD for potential CT abd.

## 2019-10-09 NOTE — Progress Notes (Signed)
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 Workup consistent with acute liver failure with multiple metabolic derangements and coagulopathies transferred to [facility] for higher level of care.    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, hypothyroidism   Significant Hospital Events   1/25 tx from [facility] to [facility] 1/26 started on prednisolone, rifaxamin, had paracentesis of the left side of abdomen 70cc removed 1/28 Mental status, Ammonia level worse, bilirubin remains high while other lft's improve 1/29 Mental status and ammonia level improving, bilirubin down slightly, fever overnight, starting to withdrawal from alcohol Consults:  IR GI Procedures:  1/25 Foley >> 1/26 paracentesis Significant Diagnostic Tests:  1/25 [facility] >> no acute intracranial abnormality; small bilateral mastoid effusions  1/ 25 CT A/P >>  1. Severe hepatic steatosis and hepatomegaly 2. Moderate to large amount of free fluid within the pelvis extending into the right abdomen which may be mildly complicated/ complex  [id number] RUQ [place] >> 1. Gallbladder sludge.  No sonographic evidence for acute cholecystitis or bilary  dilation.  2. Enlarged echogenic liver suggesting fatty inflitration  Micro Data:  1/26 BC x2 >> 1/26 UC >>  Antimicrobials:  1/26 zosyn >>  Interim history/subjective:  Improved mental status overnight, answering some questions, following commands, complains of generalized aches and pains, fever, starting to withdrawal from alcohol  Objective   Blood pressure 118/73, pulse (!) 130, temperature (!) 102 F (38.9 C), resp. rate 15, height 5\' 3"  (1.6 m), weight 89 kg, SpO2 96 %.        Intake/Output Summary (Last 24 hours) at 10/09/2019 0756 Last data filed at 10/09/2019 0700 Gross per 24 hour  Intake 2047.49 ml  Output 2155 ml  Net -107.51 ml   Filed Weights   10/07/19 0500 10/08/19 0500 10/09/19 0429  Weight: 89 kg 88.1 kg 89 kg    Examination: General:  Ill appearing young WF in NAD HEENT: pupils 4/reactive, scleral icterus  Neuro: somnolent but awakens to voice, follows commands, generalized weakness but no focal deficits, oriented to self CV: tachycardic with regular rhythm, no MRG PULM:   CTAB GI: protuberant, prominent hepatomegaly, soft, no rebound tenderness Extremities: warm/dry, +2-3 generalized edema  Skin: jaundiced  Resolved Hospital Problem list     Assessment & Plan:   Acute liver failure   Hx Hep C (do not see that she has been treated), Chronic heavy ETOH abuse, MELD score 33- with 3 month 52.6% mortality rate.  acetaminophen, HIV, cortisol, acute hepatitis panel, CMV, EBV, HSV, varicella, CK, ANA, antimitochondrial ab, anti-smooth muscle antibody.  GI repeated these and added MM panel and IgG.  So far Hep A, B, neg, hep c reactive ab but no active infection, EBV labs suggest prior infection but not active.  ANA and ribonucleoprotein returned positive.  Paracentesis of left side  of abdomen performed 1/26 SAAG ratio low 0.8.  Ammonia and mental status worse 1/28, Ammonia level and mental status improved 1/29 P:  GI following receiving prednisolone  Continue empiric zosyn  Continue to follow up Acute liver failure workup listed above Trend hepatic function/coags/ammonia/ - daily  Continue lactulose/rifaxamin  Encephalopathy- hepatic and metabolic  High risk for DTs, unclear last ETOH use (ETOH neg 1/25) Hx alcohol related seizures.  Severe anemia, hyponatremia, also contributing Ammonia level at [facility] 133 Bilirubin peaked around 27 P:  Serial neuro exams Mental status and Ammonia level improving, continue lactulose/rifaxamin ICU CIWA, precedex versed prn  Seizure precautions Keppra BID IV  Daily thiamine/ folate IV  Severe Anemia, Thrombocytopenia - severe thrombocytopenia, anemia, INR 4 likely related to liver failure , seems to be mixture of chronic blood loss anemia and bone marrow suppression from alcohol use.  OSH labs noted for B12 > 1000, folate and TIBC normal, iron low 19 with low saturation of 6.2%, ferritin 326 hemoccult negative.  Has received 4 u PRBC's and 1FFP/1Platelets as of 1/26 am.   P:  Hgb stable confirming chronic losses, transfuse for Hgb >7 Check iron studies Serial hgb checks PPI BID Sepsis workup as below  High risk for airway compromise related to encephalopathy  P:  Monitor  Supplemental O2 prn   Right sided Pelvic/abdominal complex/ complicated fluid collection Small volume ascites does not seem to communicate with pelvic fluid. Left sided abdominal paracentesis performed 1/26 70cc fluid removed.   P:  Fever overnight may need to repeat CT abd pelv with contrast later this afternoon to further evaluate fluid collection Empiric zosyn for now Check/ trend PCT  Trend WBC/ fever curve Follow up cultures ngtd  Electrolyte Abnormalities Hyponatremia Hypokalemia Hypomagnesemia Hypophosphatemia At risk for AKI P:  BID RFP- slowly corrected and has resolved Continue foley/ strict I/Os Electrolyte replacement PRN  Severe malnutrition, Hypoglycemia likely related to liver dysfunction and  alcohol use disorder.  At risk for refeeding syndrome P:  Serial cbg's SSI Tube feeds Aggressive phos replacement    Hx depression, polysubstance abuse, schizophrenia P:  Holding home haldol, seroquel, trazadone with encephalopathy  Hypothyroidism: TSH elevated to 12 P:  Continue synthroid, dosage adequate given normalization of free T4   Best practice:  Diet: Tube feeds Pain/Anxiety/Delirium protocol (if indicated): CIWA ICU VAP protocol (if indicated): n/a DVT prophylaxis: SCD GI prophylaxis: PPI BID Glucose control: cbg monitoring Mobility: BR Code Status: Full  Family Communication: mother updated by phone  Disposition: ICU  Labs   CBC: Recent Labs  Lab 10/06/19 0020 10/06/19 0851 10/07/19 0043 10/07/19 0043 10/07/19 1500 10/08/19 0134 10/08/19 0817 10/08/19 1629 10/09/19 0600  WBC 4.8   < > 4.9  --  4.0 5.8  --  6.9 7.7  NEUTROABS 3.8  --   --   --   --   --   --   --   --   HGB 5.9*   < > 8.1*   < > 7.5* 8.0* 9.2* 8.5* 7.8*  HCT 21.0*  27.6*   < > 25.4*   < > 23.3* 25.3* 27.0* 26.7* 25.7*  MCV 99.1   < > 90.7  --  88.6 91.7  --  91.1 93.8  PLT 17*   < > 39*  --  43* 41*  --  52* 64*   < > = values in this interval not displayed.    Basic Metabolic Panel: Recent Labs  Lab 10/06/19 [id number] 10/06/19 0851 10/07/19  [id number] 10/07/19 1500 10/07/19 1929 10/07/19 1929 10/08/19 0134 10/08/19 0710 10/08/19 0817 10/08/19 1312 10/08/19 1903 10/09/19 0600  NA 123*  123*   < > 126*   < > 131*   < > 132* 133* 135 134* 136  --   K 3.4*   < > 3.4*   < > 3.3*   < > 3.4* 4.4 3.6 3.9 4.3  --   CL 89*   < > 93*   < > 98  --  100 96*  --  102 102  --   CO2 16*   < > 21*   < > 22  --  21* 23  --  22 23  --   GLUCOSE 94   < > 156*   < > 213*  --  172* 127*  --  208* 165*  --   BUN 6   < > 5*   < > <5*  --  5* 7  --  7 10  --   CREATININE 0.57   < > 0.40*   < > 0.41*  --  0.38* 0.32*  --  0.44 0.32*  --   CALCIUM 8.2*   < > 7.7*   < > 7.3*  --  7.3* 7.4*  --  6.9* 7.0*   --   MG 2.0  --  1.7  --   --   --  1.6*  --   --   --   --  2.0  PHOS 1.1*   < > <1.0*   < > 1.4*  --  2.0* 1.3*  --  3.1 2.2*  --    < > = values in this interval not displayed.   GFR: Estimated Creatinine Clearance: 106.8 mL/min (A) (by C-G formula based on SCr of 0.32 mg/dL (L)). Recent Labs  Lab 10/06/19 0020 10/06/19 0851 10/07/19 0043 10/07/19 0437 10/07/19 1500 10/08/19 0134 10/08/19 1629 10/09/19 0600  PROCALCITON 1.24  --   --  1.64  --  1.08  --   --   WBC 4.8   < >   < >  --  4.0 5.8 6.9 7.7   < > = values in this interval not displayed.    Liver Function Tests: Recent Labs  Lab 10/06/19 0020 10/06/19 0020 10/06/19 0851 10/06/19 1314 10/07/19 0437 10/07/19 1500 10/08/19 0134 10/08/19 0710 10/08/19 1312 10/08/19 1903 10/09/19 0600  AST 1,663*  --  1,588*  --  970*  --  464*  --   --   --  185*  ALT 184*  --  148*  --  127*  --  101*  --   --   --  64*  ALKPHOS 132*  --  120  --  113  --  130*  --   --   --  123  BILITOT 23.7*  --  25.0*  --  27.2*  --  27.1*  --   --   --  25.2*  PROT 7.1  --  6.9  --  6.7  --  6.5  --   --   --  5.7*  ALBUMIN 2.1*   < > 2.1*  2.1*   < > 1.8*   < > 1.7*  1.7* 1.7* 1.7* 1.8* 1.5*   < > = values in this interval not displayed.   Recent Labs  Lab 10/06/19 0020 10/06/19 0851  LIPASE 49  --   AMYLASE  --  79  Recent Labs  Lab 10/07/19 0428 10/08/19 0134 10/09/19 0600  AMMONIA 127* 163* 91*    ABG    Component Value Date/Time   PHART 7.549 (H) 10/08/2019 0817   PCO2ART 30.7 (L) 10/08/2019 0817   PO2ART 77.0 (L) 10/08/2019 0817   HCO3 26.8 10/08/2019 0817   TCO2 28 10/08/2019 0817   O2SAT 97.0 10/08/2019 0817     Coagulation Profile: Recent Labs  Lab 10/06/19 0020 10/07/19 0001 10/07/19 0437 10/08/19 0134 10/09/19 0600  INR 3.5* 2.6* 2.5* 2.2* 1.9*    Cardiac Enzymes: Recent Labs  Lab 10/06/19 0025  CKTOTAL 134    HbA1C: Hgb A1c MFr Bld  Date/Time Value Ref Range Status  08/28/2018  08:54 AM 4.9 4.8 - 5.6 % Final    Comment:    (NOTE) Pre diabetes:          5.7%-6.4% Diabetes:              >6.4% Glycemic control for   <7.0% adults with diabetes   11/06/2017 06:38 AM 4.7 (L) 4.8 - 5.6 % Final    Comment:    (NOTE) Pre diabetes:          5.7%-6.4% Diabetes:              >6.4% Glycemic control for   <7.0% adults with diabetes     CBG: Recent Labs  Lab 10/08/19 1549 10/08/19 1952 10/08/19 2333 10/09/19 0332 10/09/19 0731  GLUCAP 143* 161* 160* 163* 166*    Review of Systems:   Unable to complete due to pt encephalopathy  Past Medical History  She,  has a past medical history of Alcoholism ([place]), Anemia, Anxiety, Arthritis, Asthma, Bipolar disorder ([place]), Cancer ([place]), Chronic kidney disease, Complication of anesthesia, Depression, Elevated liver enzymes, GERD (gastroesophageal reflux disease), H/O alcohol abuse, Heart murmur, Hepatitis, Hepatitis C, Hypothyroidism, Mallory-Weiss tear, Nicotine dependence, Schizophrenia ([facility]), Seizures (H[facility]), Thrombocytopenia (H[facility]), Tibial plateau fracture, right, and Upper GI bleed.   Surgical History    Past Surgical History:  Procedure Laterality Date  . APPENDECTOMY    . DILATION AND CURETTAGE OF UTERUS    . ESOPHAGOGASTRODUODENOSCOPY (EGD) WITH PROPOFOL N/A 09/04/2016   Procedure: ESOPHAGOGASTRODUODENOSCOPY (EGD) WITH PROPOFOL;  Surgeon: Doran Stabler, MD;  Location: [facility];  Service: Endoscopy;  Laterality: N/A;  . HARDWARE REMOVAL Right 06/18/2017   Procedure: REMOVAL RIGHT EXTERNAL FIXATOR;  Surgeon: Altamese Angelica, MD;  Location: [facility];  Service: Orthopedics;  Laterality: Right;  . HERNIA REPAIR     umbilical hernia  . IR PARACENTESIS  10/06/2019  . KNEE CLOSED REDUCTION Right 06/18/2017   Procedure: CLOSED MANIPULATION UNDER ANESTHESIA RIGHT KNEE;  Surgeon: Altamese [place], MD;  Location: [facility];  Service: Orthopedics;  Laterality: Right;  . ORIF FEMUR FRACTURE Right 07/18/2017   Procedure: REPAIR  NONUNION WITH RIA;  Surgeon: Altamese [place], MD;  Location: [facility] [facility];  Service: Orthopedics;  Laterality: Right;  . ORIF TIBIA PLATEAU       Social History   reports that she has been smoking cigarettes. She has been smoking about 1.00 pack per day. She has never used smokeless tobacco. She reports current alcohol use of about 16.0 standard drinks of alcohol per week. She reports that she does not use drugs.   Family History   Her family history includes Alcoholism in her brother; Mental illness in an other family member; Thyroid disease in an other family member.   Allergies Allergies  Allergen Reactions  . Ondansetron Nausea And  Vomiting  . Fish Allergy Nausea And Vomiting  . Grapeseed Extract [Nutritional Supplements] Hives  . Other Rash    Polyester     Home Medications  Prior to Admission medications   Medication Sig Start Date End Date Taking? Authorizing Provider  haloperidol (HALDOL) 10 MG tablet Take 10 mg by mouth 3 (three) times daily.    [provider]  levETIRAcetam (KEPPRA) 500 MG tablet Take 1 tablet (500 mg total) by mouth 2 (two) times daily. 10/27/18   Johnn Hai, MD  levothyroxine (SYNTHROID, LEVOTHROID) 100 MCG tablet Take 1 tablet (100 mcg total) by mouth daily at 6 (six) AM. 09/02/18   Clapacs, Madie Reno, MD  naphazoline-glycerin (CLEAR EYES REDNESS) 0.012-0.2 % SOLN Place 1-2 drops into both eyes 4 (four) times daily as needed for eye irritation.    [provider]  nicotine (NICODERM CQ - DOSED IN MG/24 HOURS) 21 mg/24hr patch Place 21 mg onto the skin daily.    [provider]  QUEtiapine (SEROQUEL) 300 MG tablet Take 300 mg by mouth 3 (three) times daily.    [provider]  Skin Protectants, Misc. (EUCERIN) cream Apply 1 application topically as needed for dry skin.    [provider]  traZODone (DESYREL) 50 MG tablet Take 50 mg by mouth at bedtime. 10/17/18   [provider]     Critical care time: 43 mins       Vickki Muff MD PGY-3 Internal Medicine

## 2019-10-09 NOTE — Progress Notes (Signed)
eLink Physician-Brief Progress Note Patient Name: Dawn Foley DOB: August 03, 1986 MRN: [id number]   Date of Service  10/09/2019  HPI/Events of Note  Ammonia level continues to rise despite Rx with Lactulose and Rifaximin.  eICU Interventions  Continue current Rx        Frederik Pear 10/09/2019, 5:50 AM

## 2019-10-09 NOTE — Progress Notes (Signed)
Progress Note   Subjective  Chief Complaint: Acute on chronic liver disease  Nursing staff reports continued low-grade fevers overnight, apparently been told that the next time she spikes a fever she needs a CT abdomen repeated.  Her mental status is somewhat improved today able to follow very minimal commands like squeezing hands.  Remains jaundiced, heart rate tachycardic, 1+ edema.   Objective   Vital signs in last 24 hours: Temp:  [95.7 F (35.4 C)-102 F (38.9 C)] 101.5 F (38.6 C) (01/29 0900) Pulse Rate:  [102-130] 108 (01/29 0900) Resp:  [9-16] 14 (01/29 0900) BP: (103-157)/(55-119) 103/68 (01/29 0900) SpO2:  [94 %-100 %] 96 % (01/29 0900) Weight:  [89 kg] 89 kg (01/29 0429) Last BM Date: 10/09/19 General:    Jaundiced, ill-appearing, white female in NAD Heart: Tachycardic, regular rhythm; no murmurs Lungs: Respirations even and unlabored, lungs CTA bilaterally Abdomen:  Soft, moderate abdominal distention, prominent hepatomegaly Extremities: +2-3 generalized edema Neurologic: Opens eyes to her name, apparently following simple commands per nursing though I cannot get her to do so at time of my exam  Intake/Output from previous day: 01/28 0701 - 01/29 0700 In: 2047.5 [NG/GT:1380; IV Piggyback:667.5] Out: 2155 [Urine:1105; Stool:1050] Intake/Output this shift: Total I/O In: 72.5 [NG/GT:60; IV Piggyback:12.5] Out: 75 [Urine:75]  Lab Results: Recent Labs    10/08/19 0134 10/08/19 0134 10/08/19 0817 10/08/19 1629 10/09/19 0600  WBC 5.8  --   --  6.9 7.7  HGB 8.0*   < > 9.2* 8.5* 7.8*  HCT 25.3*   < > 27.0* 26.7* 25.7*  PLT 41*  --   --  52* 64*   < > = values in this interval not displayed.   BMET Recent Labs    10/08/19 0710 10/08/19 0710 10/08/19 0817 10/08/19 1312 10/08/19 1903  NA 133*   < > 135 134* 136  K 4.4   < > 3.6 3.9 4.3  CL 96*  --   --  102 102  CO2 23  --   --  22 23  GLUCOSE 127*  --   --  208* 165*  BUN 7  --   --  7 10   CREATININE 0.32*  --   --  0.44 0.32*  CALCIUM 7.4*  --   --  6.9* 7.0*   < > = values in this interval not displayed.   LFT Recent Labs    10/09/19 0600  PROT 5.7*  ALBUMIN 1.5*  AST 185*  ALT 64*  ALKPHOS 123  BILITOT 25.2*  BILIDIR 15.5*  IBILI 9.7*   PT/INR Recent Labs    10/08/19 0134 10/09/19 0600  LABPROT 24.0* 21.6*  INR 2.2* 1.9*     Assessment / Plan:   Assessment: 1.  Acute liver failure, alcoholic hepatitis, hep C positive: Fatty liver, hepatomegaly on imaging currently and in the past, HCV qualitative, RNA not detected, several previous incidence of alcohol hepatitis, discriminant function score 124, prednisolone day 4, T bili decreasing today, otherwise LFTs improving 2.  Acute on chronic thrombocytopenia 3.  Coagulopathy 4.  Hepatic encephalopathy 5.  Hyponatremia 6.  Macrocytic anemia 7.  Pelvic ascites 8.  Mallory-Weiss tear with coffee-ground emesis on 08/2016 EGD 9.  Protein calorie malnutrition 10.  History of colon cancer in 2009: Last colonoscopy 2015 11.  Polysubstance abuse with history of alcohol withdrawal seizures and DTs  Plan: 1.  Supportive care 2.  Continue Prednisolone. 3.  Plan Lille score after 7 days of  steroids 4.  IgG still pending 5.  Please await further recommendations from Dr. Tarri Glenn later today.  Thank you for your kind consultation, we will continue to follow.   LOS: 4 days   Levin Erp  10/09/2019, 10:26 AM

## 2019-10-09 NOTE — Progress Notes (Signed)
Nutrition Follow-up  DOCUMENTATION CODES:   Not applicable  INTERVENTION:   Continue TF via Cortrak:   Vital 1.5 at 60 ml/h (1440 ml per day)   Pro-stat 30 ml once daily   Provides 2260 kcal, 112 gm protein, 1100 ml free water daily  NUTRITION DIAGNOSIS:   Inadequate oral intake related to lethargy/confusion as evidenced by NPO status.  Ongoing   GOAL:   Patient will meet greater than or equal to 90% of their needs   Met with TF  MONITOR:   TF tolerance, Labs, Skin, Diet advancement  ASSESSMENT:   34 yo female admitted with acute alcoholic hepatitis with acute liver failure. PMH includes hepatitis C, alcoholism, depression, bipolar D/O, colon cancer, hypothyroidism, CKD, seizures, smoker.   Patient remains encephalopathic. Remains NPO.  She is starting to have symptoms of alcohol withdrawal. Precedex ordered.   She had a fever overnight, may need repeat CT abd to evaluate fluid collection, may need drainage.   Currently receiving Vital 1.5 at 60 ml/h with Pro-stat 30 ml once daily via Cortrak tube. Providing 2260 kcal, 112 gm protein, 1100 ml free water daily. Tolerating well.  Low phos and mag likely related to refeeding syndrome, receiving IV repletion.   Labs reviewed. K 3.1 (L), phos 2.3 (L), mag 2 WNL CBG's: [phone]  Medications reviewed and include folic acid, novolog, lactulose, prednisolone, thiamine, precedex, Keppra, potassium phosphate, sodium phosphate.  Weight up to 89 kg today, 85.2 kg on admission (1/25) I/O + 2 L  Diet Order:   Diet Order            Diet NPO time specified  Diet effective now              EDUCATION NEEDS:   Not appropriate for education at this time  Skin:  Skin Assessment: Reviewed RN Assessment(jaundice)  Last BM:  1/29  Height:   Ht Readings from Last 1 Encounters:  10/05/19 5' 3"  (1.6 m)    Weight:   Wt Readings from Last 1 Encounters:  10/09/19 89 kg    Ideal Body Weight:  52.3 kg  BMI:   Body mass index is 34.76 kg/m.  Estimated Nutritional Needs:   Kcal:  2200-2400  Protein:  >/= 109 gm  Fluid:  1-1.2 L    Molli Barrows, RD, LDN, [place] Pager [phone] After Hours Pager [phone]

## 2019-10-09 NOTE — Progress Notes (Signed)
Spoke w/ pts mom to provide updates 

## 2019-10-10 ENCOUNTER — Inpatient Hospital Stay (HOSPITAL_COMMUNITY): Payer: Self-pay

## 2019-10-10 DIAGNOSIS — F10231 Alcohol dependence with withdrawal delirium: Secondary | ICD-10-CM

## 2019-10-10 LAB — RENAL FUNCTION PANEL
Albumin: 1.4 g/dL — ABNORMAL LOW (ref 3.5–5.0)
Albumin: 1.5 g/dL — ABNORMAL LOW (ref 3.5–5.0)
Anion gap: 12 (ref 5–15)
Anion gap: 12 (ref 5–15)
BUN: 25 mg/dL — ABNORMAL HIGH (ref 6–20)
BUN: 39 mg/dL — ABNORMAL HIGH (ref 6–20)
CO2: 24 mmol/L (ref 22–32)
CO2: 23 mmol/L (ref 22–32)
Calcium: 6.7 mg/dL — ABNORMAL LOW (ref 8.9–10.3)
Calcium: 7.2 mg/dL — ABNORMAL LOW (ref 8.9–10.3)
Chloride: 106 mmol/L (ref 98–111)
Chloride: 110 mmol/L (ref 98–111)
Creatinine, Ser: 0.77 mg/dL (ref 0.44–1.00)
Creatinine, Ser: 1.46 mg/dL — ABNORMAL HIGH (ref 0.44–1.00)
GFR calc Af Amer: >60 mL/min (ref >60)
GFR calc Af Amer: 55 mL/min — ABNORMAL LOW (ref >60)
GFR calc non Af Amer: >60 mL/min (ref >60)
GFR calc non Af Amer: 47 mL/min — ABNORMAL LOW (ref >60)
Glucose, Bld: 170 mg/dL — ABNORMAL HIGH (ref 70–99)
Glucose, Bld: 128 mg/dL — ABNORMAL HIGH (ref 70–99)
Phosphorus: 3.5 mg/dL (ref 2.5–4.6)
Phosphorus: 3.3 mg/dL (ref 2.5–4.6)
Potassium: 3.1 mmol/L — ABNORMAL LOW (ref 3.5–5.1)
Potassium: 3.8 mmol/L (ref 3.5–5.1)
Sodium: 142 mmol/L (ref 135–145)
Sodium: 145 mmol/L (ref 135–145)

## 2019-10-10 LAB — GLUCOSE, CAPILLARY
Glucose-Capillary: 147 mg/dL — ABNORMAL HIGH (ref 70–99)
Glucose-Capillary: 158 mg/dL — ABNORMAL HIGH (ref 70–99)
Glucose-Capillary: 191 mg/dL — ABNORMAL HIGH (ref 70–99)
Glucose-Capillary: 130 mg/dL — ABNORMAL HIGH (ref 70–99)
Glucose-Capillary: 88 mg/dL (ref 70–99)

## 2019-10-10 LAB — HEPATIC FUNCTION PANEL
ALT: 54 U/L — ABNORMAL HIGH (ref 0–44)
AST: 128 U/L — ABNORMAL HIGH (ref 15–41)
Albumin: 1.4 g/dL — ABNORMAL LOW (ref 3.5–5.0)
Alkaline Phosphatase: 99 U/L (ref 38–126)
Bilirubin, Direct: 16 mg/dL — ABNORMAL HIGH (ref 0.0–0.2)
Indirect Bilirubin: 8.4 mg/dL — ABNORMAL HIGH (ref 0.3–0.9)
Total Bilirubin: 24.4 mg/dL (ref 0.3–1.2)
Total Protein: 5.6 g/dL — ABNORMAL LOW (ref 6.5–8.1)

## 2019-10-10 LAB — CBC
HCT: 24.9 % — ABNORMAL LOW (ref 36.0–46.0)
Hemoglobin: 7.2 g/dL — ABNORMAL LOW (ref 12.0–15.0)
MCH: 27.7 pg (ref 26.0–34.0)
MCHC: 28.9 g/dL — ABNORMAL LOW (ref 30.0–36.0)
MCV: 95.8 fL (ref 80.0–100.0)
Platelets: 69 10*3/uL — ABNORMAL LOW (ref 150–400)
RBC: 2.6 MIL/uL — ABNORMAL LOW (ref 3.87–5.11)
RDW: 21.3 % — ABNORMAL HIGH (ref 11.5–15.5)
WBC: 7.3 10*3/uL (ref 4.0–10.5)
nRBC: 12 % — ABNORMAL HIGH (ref 0.0–0.2)

## 2019-10-10 LAB — PROTIME-INR
INR: 1.9 — ABNORMAL HIGH (ref 0.8–1.2)
Prothrombin Time: 21.5 seconds — ABNORMAL HIGH (ref 11.4–15.2)

## 2019-10-10 LAB — AMMONIA: Ammonia: 87 umol/L — ABNORMAL HIGH (ref 9–35)

## 2019-10-10 LAB — MAGNESIUM: Magnesium: 2.1 mg/dL (ref 1.7–2.4)

## 2019-10-10 MED ORDER — IOHEXOL 300 MG/ML  SOLN
100.0000 mL | Freq: Once | INTRAMUSCULAR | Status: AC | PRN
  Administered 2019-10-10: 100 mL via INTRAVENOUS

## 2019-10-10 MED ORDER — LORAZEPAM 2 MG/ML IJ SOLN
1.0000 mg | INTRAMUSCULAR | Status: DC | PRN
  Administered 2019-10-10: 14:00:00 1 mg via INTRAVENOUS
  Administered 2019-10-10 – 2019-10-12 (×11): 2 mg via INTRAVENOUS
  Filled 2019-10-10 (×12): qty 1

## 2019-10-10 MED ORDER — ACETAMINOPHEN 160 MG/5ML PO SOLN
325.0000 mg | Freq: Once | ORAL | Status: AC | PRN
  Administered 2019-10-10: 325 mg via ORAL
  Filled 2019-10-10: qty 20.3

## 2019-10-10 MED ORDER — DEXMEDETOMIDINE HCL IN NACL 400 MCG/100ML IV SOLN: 0.2000 ug/kg/h | INTRAVENOUS | Status: DC

## 2019-10-10 MED ORDER — POTASSIUM CHLORIDE CRYS ER 20 MEQ PO TBCR
40.0000 meq | EXTENDED_RELEASE_TABLET | Freq: Three times a day (TID) | ORAL | Status: AC
  Administered 2019-10-10 (×2): 40 meq via ORAL
  Filled 2019-10-10 (×2): qty 2

## 2019-10-10 NOTE — Progress Notes (Addendum)
Pt has called out multiple times throughout the night yelling for water and cussing at staff. Pt has been educated throughout the night of her NPO status and why she is not able to have anything to drink. RN has provided oral care numerous times throughout the shift. Pt still very lethargic, labile and  confused. Pt is alert and oriented to person and place. Will continue to monitor closely.  Clint Bolder, RN 10/10/19 7:01 AM

## 2019-10-10 NOTE — Progress Notes (Signed)
[facility] Progress Note Patient Name: Dawn Foley DOB: [birth year] MRN: [id number]   Date of Service  10/10/2019  HPI/Events of Note  Notified of fever 101.3. Has been febrile and is on vancomycin and piperacillin-tazobactam. Also on systemic steroids for possible autoimmune process  eICU Interventions   Ordered one time dose of acetaminophen per NG  Will defer to AM bedside MD if will proceed with abdominal CT     Intervention Category Major Interventions: Other:  Judd Lien 10/10/2019, 3:37 AM

## 2019-10-10 NOTE — Progress Notes (Signed)
    Progress Note   Subjective  Chief Complaint: Acute on chronic liver disease  Today, per nursing the patient has been more alert and oriented, but this was too extreme where she was agitated and calling out for water constantly, unsatisfied.  Currently she has just had a dose of Ativan and is sedated.  Has just returned from a CT of the abdomen.   Objective   Vital signs in last 24 hours: Temp:  [98.8 F (37.1 C)-101.1 F (38.4 C)] 100.2 F (37.9 C) (01/30 1100) Pulse Rate:  [76-89] 88 (01/30 1100) Resp:  [9-14] 13 (01/30 1100) BP: (78-117)/(45-69) 78/48 (01/30 1100) SpO2:  [95 %-98 %] 98 % (01/30 1100) Last BM Date: 10/10/19 General: Jaundiced, ill-appearing, white female in NAD Heart: Tachycardic, regular rhythm; no murmurs Lungs: Respirations even and unlabored, lungs CTA bilaterally Abdomen:  Soft, moderate abdominal distention, prominent hepatomegaly. Normal bowel sounds. Extremities: +2-3 generalized edema Neurologic: Sedated  Intake/Output from previous day: 01/29 0701 - 01/30 0700 In: 3099.3 [I.V.:260.5; NG/GT:1700; IV Piggyback:1138.9] Out: 2785 [Urine:885; Stool:1900] Intake/Output this shift: Total I/O In: 208.5 [NG/GT:180; IV Piggyback:28.5] Out: 410 [Urine:10; Stool:400]  Lab Results: Recent Labs    10/08/19 1629 10/09/19 0600 10/10/19 0542  WBC 6.9 7.7 7.3  HGB 8.5* 7.8* 7.2*  HCT 26.7* 25.7* 24.9*  PLT 52* 64* 69*   BMET Recent Labs    10/09/19 1013 10/09/19 1753 10/10/19 0542  NA 139 141 142  K 3.1* 3.3* 3.1*  CL 105 108 106  CO2 25 24 24   GLUCOSE 186* 230* 170*  BUN 13 15 25*  CREATININE 0.51 0.52 0.77  CALCIUM 7.0* 6.8* 6.7*   LFT Recent Labs    10/10/19 0542  PROT 5.6*  ALBUMIN 1.4*  1.4*  AST 128*  ALT 54*  ALKPHOS 99  BILITOT 24.4*  BILIDIR 16.0*  IBILI 8.4*   PT/INR Recent Labs    10/09/19 0600 10/10/19 0542  LABPROT 21.6* 21.5*  INR 1.9* 1.9*    Assessment / Plan:   Assessment: 1.  Acute liver failure,  alcoholic hepatitis, hep C positive: Fatty liver, hepatomegaly on imaging currently and in the past, HCV qualitative positive, RNA not detected, ANA is positive but other AIH labs are negative (IgG, SPEP and UPEP are still pending), several previous incidence of alcohol hepatitis, discriminant function score 124, ; thought likely due to alcohol prednisolone day 5, T bili decreasing today, otherwise LFTs improving 2.  Acute on chronic thrombocytopenia 3.  Coagulopathy: Improved with vitamin K 4.  Hepatic encephalopathy: Apparently improving per nursing staff with aggressive lactulose and rifaximin 5.  Hyponatremia 6.  Macrocytic anemia 7.  Pelvic ascites 8.  Mallory-Weiss tear with coffee-ground emesis on 08/2016 EGD 9.  Protein calorie malnutrition 10.  History of colon cancer in 2009 11.  Polysubstance abuse with history of alcohol withdrawal seizures and DTs  Plan: 1.  Supportive care 2.  Continue Prednisolone 3.  Plan Lille score after 7 days of steroids 4.  Would recommend a diagnostic paracentesis given continued fevers.  We will send fluid for cell count, differential, cultures, albumin and TP 5.  Also recommend urine and blood cultures 6.  Please await any further recommendations from Dr. Tarri Glenn later today  Thank you for your kind consultation, we will continue to follow.   LOS: 5 days   Levin Erp  10/10/2019, 11:43 AM

## 2019-10-10 NOTE — Progress Notes (Signed)
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 Workup consistent with acute liver failure with multiple metabolic derangements and coagulopathies transferred to [facility] for higher level of care.    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 >> no acute intracranial abnormality; small bilateral mastoid effusions  1/ 25 CT A/P >>  1. Severe hepatic steatosis and hepatomegaly 2. Moderate to large amount of free fluid within the pelvis extending into the right abdomen which may be mildly complicated/ complex  [id number] RUQ [place] >> 1. Gallbladder sludge.  No sonographic evidence for acute cholecystitis or bilary  dilation.  2. Enlarged echogenic liver suggesting fatty inflitration  Micro Data:  1/26 BC x2 >> 1/26 UC >>  Antimicrobials:  1/26 zosyn >>  Interim history/subjective:  Fever overnight Alcohol withdrawal in precedex now  Objective   Blood pressure (!) 87/49, pulse 78, temperature 99.9 F (37.7 C), resp. rate 12, height 5\' 3"  (1.6 m), weight 89 kg, SpO2 96 %.        Intake/Output Summary (Last 24 hours) at 10/10/2019 [id number] Last data filed at 10/10/2019 0600 Gross per 24 hour  Intake 2940.74 ml  Output 2710 ml  Net 230.74 ml   Filed Weights   10/07/19 0500 10/08/19 0500 10/09/19 0429  Weight: 89 kg 88.1 kg 89 kg    Examination: General:  Acutely ill appearing female, NAD HEENT: [place]/AT, PERRL, EOM-I and MMM Neuro: Lethargic but arousable, exam non-focal CV: RRR, Nl S1/S2 and -M/R/G PULM:   CTA bilaterally GI: protuberant, prominent hepatomegaly, soft, no rebound tenderness Extremities: warm/dry, +2-3 generalized edema  Skin: jaundiced  I reviewed previous CXR myself, no acute disease noted  Resolved Hospital Problem list     Assessment & Plan:   Acute liver failure   Hx Hep C (do not see that she has been treated), Chronic heavy ETOH abuse, MELD score 33- with 3 month 52.6% mortality rate.  acetaminophen, HIV, cortisol, acute hepatitis panel, CMV, EBV, HSV, varicella, CK, ANA, antimitochondrial ab, anti-smooth muscle antibody.  GI repeated these and added MM panel and IgG.  So far Hep A, B, neg, hep c reactive ab but no active infection, EBV labs suggest prior infection but not active.  ANA and ribonucleoprotein returned positive.  Paracentesis of left side of abdomen performed 1/26 SAAG ratio low 0.8.  Ammonia and mental status worse 1/28, Ammonia level  and mental status improved 1/29 P:  GI following Receiving prednisolone Continue vanc/zosyn ?repeat para, will defer to GI again CT of the abdomen with portal HTN focus Continue to follow up Acute liver failure  workup listed above Trend hepatic function/coags/ammonia/ - daily  Continue lactulose/rifaxamin for hepatic encephalopathy LFTs ordered for AM  Encephalopathy- hepatic and metabolic  High risk for DTs, unclear last ETOH use (ETOH neg 1/25) Hx alcohol related seizures.  Severe anemia, hyponatremia, also contributing Ammonia level at [facility] 133 Bilirubin peaked around 27 P:  Serial neuro exams Mental status and Ammonia level improving, continue lactulose/rifaxamin Precedex for etoh withdrawal PRN versed Seizure precautions  Keppra BID IV  Daily thiamine/ folate IV CIWA protocol with ativan  Severe Anemia, Thrombocytopenia - severe thrombocytopenia, anemia, INR 4 likely related to liver failure , seems to be mixture of chronic blood loss anemia and bone marrow suppression from alcohol use.  OSH labs noted for B12 > 1000, folate and TIBC normal, iron low 19 with low saturation of 6.2%, ferritin 326 hemoccult negative.  Has received 4 u PRBC's and 1FFP/1Platelets as of 1/26 am.   P:  Hgb stable confirming chronic losses, transfuse for Hgb >7 CBC in AM PPI BID Sepsis workup as below  High risk for airway compromise related to encephalopathy  P:  Monitor  Supplemental O2 prn   Right sided Pelvic/abdominal complex/ complicated fluid collection Small volume ascites does not seem to communicate with pelvic fluid. Left sided abdominal paracentesis performed 1/26 70cc fluid removed.   P:  Will repeat CT of the abdomen if patient will tolerate Empiric zosyn for now Check/ trend PCT  Trend WBC/ fever curve Follow up cultures ngtd  Electrolyte Abnormalities Hyponatremia Hypokalemia Hypomagnesemia Hypophosphatemia At risk for AKI P:  BID RFP- slowly corrected and has resolved Continue foley/ strict I/Os Electrolyte replacement PRN  Severe malnutrition, Hypoglycemia likely related to liver dysfunction and alcohol use disorder.  At risk for refeeding syndrome P:  Serial  cbg's SSI Tube feeds Aggressive phos replacement    Hx depression, polysubstance abuse, schizophrenia P:  Holding home haldol, seroquel, trazadone with encephalopathy  Hypothyroidism: TSH elevated to 12 P:  Continue synthroid, dosage adequate given normalization of free T4  Called by radiology, CT of the abdomen with significant pneumatosis, CCS evaluated the patient, recommend NPO and monitoring as surgical mortality is very high.  Best practice:  Diet: Tube feeds Pain/Anxiety/Delirium protocol (if indicated): CIWA ICU VAP protocol (if indicated): n/a DVT prophylaxis: SCD GI prophylaxis: PPI BID Glucose control: cbg monitoring Mobility: BR Code Status: Full  Family Communication: mother updated by phone  Disposition: ICU  Labs   CBC: Recent Labs  Lab 10/06/19 0020 10/06/19 0851 10/07/19 1500 10/07/19 1500 10/08/19 0134 10/08/19 0817 10/08/19 1629 10/09/19 0600 10/10/19 0542  WBC 4.8   < > 4.0  --  5.8  --  6.9 7.7 7.3  NEUTROABS 3.8  --   --   --   --   --   --   --   --   HGB 5.9*   < > 7.5*   < > 8.0* 9.2* 8.5* 7.8* 7.2*  HCT 21.0*  27.6*   < > 23.3*   < > 25.3* 27.0* 26.7* 25.7* 24.9*  MCV 99.1   < > 88.6  --  91.7  --  91.1 93.8 95.8  PLT 17*   < > 43*  --  41*  --  52* 64* 69*   < > = values  in this interval not displayed.    Basic Metabolic Panel: Recent Labs  Lab 10/06/19 0614 10/06/19 0851 10/07/19 0437 10/07/19 1500 10/08/19 0134 10/08/19 0710 10/08/19 1312 10/08/19 1903 10/09/19 0600 10/09/19 1013 10/09/19 1753 10/10/19 0542  NA 123*  123*   < > 126*   < > 132*   < > 134* 136  --  139 141 142  K 3.4*   < > 3.4*   < > 3.4*   < > 3.9 4.3  --  3.1* 3.3* 3.1*  CL 89*   < > 93*   < > 100   < > 102 102  --  105 108 106  CO2 16*   < > 21*   < > 21*   < > 22 23  --  25 24 24   GLUCOSE 94   < > 156*   < > 172*   < > 208* 165*  --  186* 230* 170*  BUN 6   < > 5*   < > 5*   < > 7 10  --  13 15 25*  CREATININE 0.57   < > 0.40*   < > 0.38*   < >  0.44 0.32*  --  0.51 0.52 0.77  CALCIUM 8.2*   < > 7.7*   < > 7.3*   < > 6.9* 7.0*  --  7.0* 6.8* 6.7*  MG 2.0  --  1.7  --  1.6*  --   --   --  2.0  --   --  2.1  PHOS 1.1*   < > <1.0*   < > 2.0*   < > 3.1 2.2*  --  2.3* 4.3 3.5   < > = values in this interval not displayed.   GFR: Estimated Creatinine Clearance: 106.8 mL/min (by C-G formula based on SCr of 0.77 mg/dL). Recent Labs  Lab 10/06/19 0020 10/06/19 0851 10/07/19 0437 10/07/19 1500 10/08/19 0134 10/08/19 1629 10/09/19 0600 10/09/19 1013 10/10/19 0542  PROCALCITON 1.24  --  1.64  --  1.08  --   --  1.02  --   WBC 4.8   < >  --    < > 5.8 6.9 7.7  --  7.3   < > = values in this interval not displayed.    Liver Function Tests: Recent Labs  Lab 10/06/19 0851 10/06/19 1314 10/07/19 0437 10/07/19 1500 10/08/19 0134 10/08/19 0710 10/08/19 1903 10/09/19 0600 10/09/19 1013 10/09/19 1753 10/10/19 0542  AST 1,588*  --  970*  --  464*  --   --  185*  --   --  128*  ALT 148*  --  127*  --  101*  --   --  64*  --   --  54*  ALKPHOS 120  --  113  --  130*  --   --  123  --   --  99  BILITOT 25.0*  --  27.2*  --  27.1*  --   --  25.2*  --   --  24.4*  PROT 6.9  --  6.7  --  6.5  --   --  5.7*  --   --  5.6*  ALBUMIN 2.1*  2.1*   < > 1.8*   < > 1.7*  1.7*   < > 1.8* 1.5* 1.6* 1.6* 1.4*  1.4*   < > = values in this interval not displayed.   Recent Labs  Lab  10/06/19 0020 10/06/19 0851  LIPASE 49  --   AMYLASE  --  79   Recent Labs  Lab 10/07/19 0428 10/08/19 0134 10/09/19 0600 10/10/19 0542  AMMONIA 127* 163* 91* 87*    ABG    Component Value Date/Time   PHART 7.549 (H) 10/08/2019 0817   PCO2ART 30.7 (L) 10/08/2019 0817   PO2ART 77.0 (L) 10/08/2019 0817   HCO3 26.8 10/08/2019 0817   TCO2 28 10/08/2019 0817   O2SAT 97.0 10/08/2019 0817     Coagulation Profile: Recent Labs  Lab 10/07/19 0001 10/07/19 0437 10/08/19 0134 10/09/19 0600 10/10/19 0542  INR 2.6* 2.5* 2.2* 1.9* 1.9*    Cardiac  Enzymes: Recent Labs  Lab 10/06/19 0025  CKTOTAL 134    HbA1C: Hgb A1c MFr Bld  Date/Time Value Ref Range Status  08/28/2018 08:54 AM 4.9 4.8 - 5.6 % Final    Comment:    (NOTE) Pre diabetes:          5.7%-6.4% Diabetes:              >6.4% Glycemic control for   <7.0% adults with diabetes   11/06/2017 06:38 AM 4.7 (L) 4.8 - 5.6 % Final    Comment:    (NOTE) Pre diabetes:          5.7%-6.4% Diabetes:              >6.4% Glycemic control for   <7.0% adults with diabetes     CBG: Recent Labs  Lab 10/09/19 1553 10/09/19 1936 10/09/19 2324 10/10/19 0335 10/10/19 0745  GLUCAP 240* 203* 160* 147* 158*   The patient is critically ill with multiple organ systems failure and requires high complexity decision making for assessment and support, frequent evaluation and titration of therapies, application of advanced monitoring technologies and extensive interpretation of multiple databases.   Critical Care Time devoted to patient care services described in this note is  32  Minutes. This time reflects time of care of this signee Dr Jennet Maduro. This critical care time does not reflect procedure time, or teaching time or supervisory time of PA/NP/Med student/Med Resident etc but could involve care discussion time.  Rush Farmer, M.D. [facility] Pulmonary/Critical Care Medicine.

## 2019-10-10 NOTE — Progress Notes (Signed)
Pts temp now 101.1 (foley temp). [place] notified, per day team for possible repeat CT abdomen.  Clint Bolder, RN 10/10/19 3:21 AM

## 2019-10-10 NOTE — Plan of Care (Signed)
  Problem: Clinical Measurements: Goal: Respiratory complications will improve Outcome: Progressing Goal: Cardiovascular complication will be avoided Outcome: Progressing   Problem: Nutrition: Goal: Adequate nutrition will be maintained Outcome: Progressing   Problem: Elimination: Goal: Will not experience complications related to bowel motility Outcome: Progressing Goal: Will not experience complications related to urinary retention Outcome: Progressing   Problem: Safety: Goal: Ability to remain free from injury will improve Outcome: Progressing   Problem: Skin Integrity: Goal: Risk for impaired skin integrity will decrease Outcome: Progressing

## 2019-10-10 NOTE — Consult Note (Signed)
Reason for Consult:colonic pneumatosis Referring Physician: Ellin Goodie  Dawn Foley is an 34 y.o. female.  HPI: 34 year old female with long history of alcohol abuse, hepatitis C, and multiple medical problems was admitted with acute, severe hepatic failure.  She is being managed by critical care medicine and gastroenterology.  She has been confused and had a fever.  She underwent CT scan of the abdomen pelvis today which showed diffuse colonic pneumatosis.  I was asked to see her from a surgical standpoint.  She cannot answer many of my questions at this time but she does ask me to give her some alcohol.  Past Medical History:  Diagnosis Date  . Alcoholism ([place])   . Anemia   . Anxiety   . Arthritis   . Asthma    as a child  . Bipolar disorder ([place])   . Cancer ([place])    colon  . Chronic kidney disease   . Complication of anesthesia    woke up during colonoscopy  . Depression   . Elevated liver enzymes   . GERD (gastroesophageal reflux disease)   . H/O alcohol abuse    clean for 1 month as of 07/17/17  . Heart murmur    per [facility] per PT  . Hepatitis    denies  . Hepatitis C   . Hypothyroidism   . Mallory-Weiss tear   . Nicotine dependence   . Schizophrenia ([place])   . Seizures ([place])    seizures - most recent 05/2017, supposed to be on Tegretol but can't afford  . Thrombocytopenia ([facility])    Overview:  alcoholism  . Tibial plateau fracture, right   . Upper GI bleed     Past Surgical History:  Procedure Laterality Date  . APPENDECTOMY    . DILATION AND CURETTAGE OF UTERUS    . ESOPHAGOGASTRODUODENOSCOPY (EGD) WITH PROPOFOL N/A 09/04/2016   Procedure: ESOPHAGOGASTRODUODENOSCOPY (EGD) WITH PROPOFOL;  Surgeon: Doran Stabler, MD;  Location: [place];  Service: Endoscopy;  Laterality: N/A;  . HARDWARE REMOVAL Right 06/18/2017   Procedure: REMOVAL RIGHT EXTERNAL FIXATOR;  Surgeon: Altamese Newberry, MD;  Location: [place];  Service: Orthopedics;   Laterality: Right;  . HERNIA REPAIR     umbilical hernia  . IR PARACENTESIS  10/06/2019  . KNEE CLOSED REDUCTION Right 06/18/2017   Procedure: CLOSED MANIPULATION UNDER ANESTHESIA RIGHT KNEE;  Surgeon: Altamese Lehi, MD;  Location: [place];  Service: Orthopedics;  Laterality: Right;  . ORIF FEMUR FRACTURE Right 07/18/2017   Procedure: REPAIR NONUNION WITH RIA;  Surgeon: Altamese Sanford, MD;  Location: [facility];  Service: Orthopedics;  Laterality: Right;  . ORIF TIBIA PLATEAU      Family History  Problem Relation Age of Onset  . Mental illness Other   . Thyroid disease Other   . Alcoholism Brother     Social History:  reports that she has been smoking cigarettes. She has been smoking about 1.00 pack per day. She has never used smokeless tobacco. She reports current alcohol use of about 16.0 standard drinks of alcohol per week. She reports that she does not use drugs.  Allergies:  Allergies  Allergen Reactions  . Ondansetron Nausea And Vomiting  . Fish Allergy Nausea And Vomiting  . Grapeseed Extract [Nutritional Supplements] Hives  . Other Rash    Polyester    Medications: I have reviewed the patient's current medications.  Results for orders placed or performed during the hospital encounter of 10/05/19 (from the past 48 hour(s))  Glucose, capillary     Status: Abnormal   Collection Time: 10/08/19  3:49 PM  Result Value Ref Range   Glucose-Capillary 143 (H) 70 - 99 mg/dL  CBC     Status: Abnormal   Collection Time: 10/08/19  4:29 PM  Result Value Ref Range   WBC 6.9 4.0 - 10.5 K/uL   RBC 2.93 (L) 3.87 - 5.11 MIL/uL   Hemoglobin 8.5 (L) 12.0 - 15.0 g/dL   HCT 26.7 (L) 36.0 - 46.0 %   MCV 91.1 80.0 - 100.0 fL   MCH 29.0 26.0 - 34.0 pg   MCHC 31.8 30.0 - 36.0 g/dL   RDW 19.2 (H) 11.5 - 15.5 %   Platelets 52 (L) 150 - 400 K/uL    Comment: REPEATED TO VERIFY Immature Platelet Fraction may be clinically indicated, consider ordering this additional test [id number] CONSISTENT WITH  PREVIOUS RESULT    nRBC 6.8 (H) 0.0 - 0.2 %    Comment: Performed at [facility], [address], [place], [place] [address]  Renal function panel     Status: Abnormal   Collection Time: 10/08/19  7:03 PM  Result Value Ref Range   Sodium 136 135 - 145 mmol/L   Potassium 4.3 3.5 - 5.1 mmol/L    Comment: SLIGHT HEMOLYSIS   Chloride 102 98 - 111 mmol/L   CO2 23 22 - 32 mmol/L   Glucose, Bld 165 (H) 70 - 99 mg/dL   BUN 10 6 - 20 mg/dL   Creatinine, Ser 0.32 (L) 0.44 - 1.00 mg/dL   Calcium 7.0 (L) 8.9 - 10.3 mg/dL   Phosphorus 2.2 (L) 2.5 - 4.6 mg/dL   Albumin 1.8 (L) 3.5 - 5.0 g/dL   GFR calc non Af Amer >60 >60 mL/min   GFR calc Af Amer >60 >60 mL/min   Anion gap 11 5 - 15    Comment: Performed at [facility], [place] [address] [place], [place] [address]  Glucose, capillary     Status: Abnormal   Collection Time: 10/08/19  7:52 PM  Result Value Ref Range   Glucose-Capillary 161 (H) 70 - 99 mg/dL  Glucose, capillary     Status: Abnormal   Collection Time: 10/08/19 11:33 PM  Result Value Ref Range   Glucose-Capillary 160 (H) 70 - 99 mg/dL  Glucose, capillary     Status: Abnormal   Collection Time: 10/09/19  3:32 AM  Result Value Ref Range   Glucose-Capillary 163 (H) 70 - 99 mg/dL  Ammonia     Status: Abnormal   Collection Time: 10/09/19  6:00 AM  Result Value Ref Range   Ammonia 91 (H) 9 - 35 umol/L    Comment: Performed at [facility], [place] [address] [place], [place] [address]  Magnesium     Status: None   Collection Time: 10/09/19  6:00 AM  Result Value Ref Range   Magnesium 2.0 1.7 - 2.4 mg/dL    Comment: Performed at [facility] [address], [place],  [address]  Protime-INR     Status: Abnormal   Collection Time: 10/09/19  6:00 AM  Result Value Ref Range   Prothrombin Time 21.6 (H) 11.4 - 15.2 seconds   INR 1.9 (H) 0.8 - 1.2    Comment: (NOTE) INR goal varies based on device and disease states. Performed at [facility], [place] [address] [place],  [address]   T4, free  Status: None   Collection Time: 10/09/19  6:00 AM  Result Value Ref Range   Free T4 1.03 0.61 - 1.12 ng/dL    Comment: (NOTE) Biotin ingestion may interfere with free T4 tests. If the results are inconsistent with the TSH level, previous test results, or the clinical presentation, then consider biotin interference. If needed, order repeat testing after stopping biotin. Performed at [facility], [place] [address], [place], [place] [address]   Hepatic function panel     Status: Abnormal   Collection Time: 10/09/19  6:00 AM  Result Value Ref Range   Total Protein 5.7 (L) 6.5 - 8.1 g/dL   Albumin 1.5 (L) 3.5 - 5.0 g/dL   AST 185 (H) 15 - 41 U/L   ALT 64 (H) 0 - 44 U/L   Alkaline Phosphatase 123 38 - 126 U/L   Total Bilirubin 25.2 (HH) 0.3 - 1.2 mg/dL    Comment: CRITICAL VALUE NOTED.  VALUE IS CONSISTENT WITH PREVIOUSLY REPORTED AND CALLED VALUE.   Bilirubin, Direct 15.5 (H) 0.0 - 0.2 mg/dL    Comment: RESULTS CONFIRMED BY MANUAL DILUTION   Indirect Bilirubin 9.7 (H) 0.3 - 0.9 mg/dL    Comment: Performed at [facility] [address], [place], [place] [address]  CBC     Status: Abnormal   Collection Time: 10/09/19  6:00 AM  Result Value Ref Range   WBC 7.7 4.0 - 10.5 K/uL   RBC 2.74 (L) 3.87 - 5.11 MIL/uL   Hemoglobin 7.8 (L) 12.0 - 15.0 g/dL   HCT 25.7 (L) 36.0 - 46.0 %   MCV 93.8 80.0 - 100.0 fL   MCH 28.5 26.0 - 34.0 pg   MCHC 30.4 30.0 - 36.0 g/dL   RDW 21.0 (H) 11.5 - 15.5 %   Platelets 64 (L) 150 - 400 K/uL    Comment: REPEATED TO VERIFY Immature Platelet Fraction may be clinically indicated, consider ordering this additional test [id number] CONSISTENT WITH PREVIOUS RESULT    nRBC 10.7 (H) 0.0 - 0.2 %    Comment: Performed at [facility], [place] [address] [place], [place] [address]  Glucose, capillary     Status: Abnormal   Collection Time: 10/09/19  7:31 AM  Result Value Ref Range    Glucose-Capillary 166 (H) 70 - 99 mg/dL  Renal function panel     Status: Abnormal   Collection Time: 10/09/19 10:13 AM  Result Value Ref Range   Sodium 139 135 - 145 mmol/L   Potassium 3.1 (L) 3.5 - 5.1 mmol/L   Chloride 105 98 - 111 mmol/L   CO2 25 22 - 32 mmol/L   Glucose, Bld 186 (H) 70 - 99 mg/dL   BUN 13 6 - 20 mg/dL   Creatinine, Ser 0.51 0.44 - 1.00 mg/dL   Calcium 7.0 (L) 8.9 - 10.3 mg/dL   Phosphorus 2.3 (L) 2.5 - 4.6 mg/dL   Albumin 1.6 (L) 3.5 - 5.0 g/dL   GFR calc non Af Amer >60 >60 mL/min   GFR calc Af Amer >60 >60 mL/min   Anion gap 9 5 - 15    Comment: Performed at [facility] [address] [place], [place] [address]  Procalcitonin - Baseline     Status: None   Collection Time: 10/09/19 10:13 AM  Result Value Ref Range   Procalcitonin 1.02 ng/mL    Comment:        Interpretation: PCT > 0.5 ng/mL and <=  2 ng/mL: Systemic infection (sepsis) is possible, but other conditions are known to elevate PCT as well. (NOTE)       Sepsis PCT Algorithm           Lower Respiratory Tract                                      Infection PCT Algorithm    ----------------------------     ----------------------------         PCT < 0.25 ng/mL                PCT < 0.10 ng/mL         Strongly encourage             Strongly discourage   discontinuation of antibiotics    initiation of antibiotics    ----------------------------     -----------------------------       PCT 0.25 - 0.50 ng/mL            PCT 0.10 - 0.25 ng/mL               OR       >80% decrease in PCT            Discourage initiation of                                            antibiotics      Encourage discontinuation           of antibiotics    ----------------------------     -----------------------------         PCT >= 0.50 ng/mL              PCT 0.26 - 0.50 ng/mL                AND       <80% decrease in PCT             Encourage initiation of                                             antibiotics        Encourage continuation           of antibiotics    ----------------------------     -----------------------------        PCT >= 0.50 ng/mL                  PCT > 0.50 ng/mL               AND         increase in PCT                  Strongly encourage                                      initiation of antibiotics    Strongly encourage escalation           of antibiotics                                     -----------------------------  PCT <= 0.25 ng/mL                                                 OR                                        > 80% decrease in PCT                                     Discontinue / Do not initiate                                             antibiotics Performed at [facility], [place] [address], [place], [place] [address]   Ferritin     Status: None   Collection Time: 10/09/19 10:13 AM  Result Value Ref Range   Ferritin 161 11 - 307 ng/mL    Comment: Performed at [facility], [place] [address], [place], [place] [address]  Iron and TIBC     Status: Abnormal   Collection Time: 10/09/19 10:13 AM  Result Value Ref Range   Iron 14 (L) 28 - 170 ug/dL   TIBC 242 (L) 250 - 450 ug/dL   Saturation Ratios 6 (L) 10.4 - 31.8 %   UIBC 228 ug/dL    Comment: Performed at [facility], [place] [address], [place], [place] [address]  Glucose, capillary     Status: Abnormal   Collection Time: 10/09/19 11:34 AM  Result Value Ref Range   Glucose-Capillary 193 (H) 70 - 99 mg/dL  Glucose, capillary     Status: Abnormal   Collection Time: 10/09/19  3:53 PM  Result Value Ref Range   Glucose-Capillary 240 (H) 70 - 99 mg/dL  Renal function panel     Status: Abnormal   Collection Time: 10/09/19  5:53 PM  Result Value Ref Range   Sodium 141 135 - 145 mmol/L   Potassium 3.3 (L) 3.5 - 5.1 mmol/L   Chloride 108 98 - 111 mmol/L   CO2 24 22 - 32 mmol/L   Glucose, Bld 230 (H) 70 - 99 mg/dL   BUN 15 6 - 20 mg/dL    Creatinine, Ser 0.52 0.44 - 1.00 mg/dL   Calcium 6.8 (L) 8.9 - 10.3 mg/dL   Phosphorus 4.3 2.5 - 4.6 mg/dL   Albumin 1.6 (L) 3.5 - 5.0 g/dL   GFR calc non Af Amer >60 >60 mL/min   GFR calc Af Amer >60 >60 mL/min   Anion gap 9 5 - 15    Comment: Performed at [facility], [place] [address], [place], [place] [address]  Glucose, capillary     Status: Abnormal   Collection Time: 10/09/19  7:36 PM  Result Value Ref Range   Glucose-Capillary 203 (H) 70 - 99 mg/dL  Glucose, capillary     Status: Abnormal   Collection Time: 10/09/19 11:24 PM  Result Value Ref Range   Glucose-Capillary 160 (H) 70 - 99 mg/dL  Glucose, capillary     Status: Abnormal  Collection Time: 10/10/19  3:35 AM  Result Value Ref Range   Glucose-Capillary 147 (H) 70 - 99 mg/dL  Ammonia     Status: Abnormal   Collection Time: 10/10/19  5:42 AM  Result Value Ref Range   Ammonia 87 (H) 9 - 35 umol/L    Comment: Performed at [facility], [place] [address], [place], [place] [address]  Magnesium     Status: None   Collection Time: 10/10/19  5:42 AM  Result Value Ref Range   Magnesium 2.1 1.7 - 2.4 mg/dL    Comment: Performed at [facility] [address] [place], [place] [address]  Protime-INR     Status: Abnormal   Collection Time: 10/10/19  5:42 AM  Result Value Ref Range   Prothrombin Time 21.5 (H) 11.4 - 15.2 seconds   INR 1.9 (H) 0.8 - 1.2    Comment: (NOTE) INR goal varies based on device and disease states. Performed at [facility], [place] [address], [place], [place] [address]   Hepatic function panel     Status: Abnormal   Collection Time: 10/10/19  5:42 AM  Result Value Ref Range   Total Protein 5.6 (L) 6.5 - 8.1 g/dL   Albumin 1.4 (L) 3.5 - 5.0 g/dL   AST 128 (H) 15 - 41 U/L   ALT 54 (H) 0 - 44 U/L   Alkaline Phosphatase 99 38 - 126 U/L   Total Bilirubin 24.4 (HH) 0.3 - 1.2 mg/dL    Comment: CRITICAL VALUE NOTED.  VALUE IS CONSISTENT WITH PREVIOUSLY REPORTED AND CALLED VALUE.    Bilirubin, Direct 16.0 (H) 0.0 - 0.2 mg/dL    Comment: RESULTS CONFIRMED BY MANUAL DILUTION   Indirect Bilirubin 8.4 (H) 0.3 - 0.9 mg/dL    Comment: Performed at [facility] [address], [place],  [address]  Renal function panel     Status: Abnormal   Collection Time: 10/10/19  5:42 AM  Result Value Ref Range   Sodium 142 135 - 145 mmol/L   Potassium 3.1 (L) 3.5 - 5.1 mmol/L   Chloride 106 98 - 111 mmol/L   CO2 24 22 - 32 mmol/L   Glucose, Bld 170 (H) 70 - 99 mg/dL   BUN 25 (H) 6 - 20 mg/dL   Creatinine, Ser 0.77 0.44 - 1.00 mg/dL   Calcium 6.7 (L) 8.9 - 10.3 mg/dL   Phosphorus 3.5 2.5 - 4.6 mg/dL   Albumin 1.4 (L) 3.5 - 5.0 g/dL   GFR calc non Af Amer >60 >60 mL/min   GFR calc Af Amer >60 >60 mL/min   Anion gap 12 5 - 15    Comment: Performed at [facility] [address] [place], [place] [address]  CBC     Status: Abnormal   Collection Time: 10/10/19  5:42 AM  Result Value Ref Range   WBC 7.3 4.0 - 10.5 K/uL    Comment: REPEATED TO VERIFY WHITE COUNT CONFIRMED ON SMEAR    RBC 2.60 (L) 3.87 - 5.11 MIL/uL   Hemoglobin 7.2 (L) 12.0 - 15.0 g/dL   HCT 24.9 (L) 36.0 - 46.0 %   MCV 95.8 80.0 - 100.0 fL   MCH 27.7 26.0 - 34.0 pg   MCHC 28.9 (L) 30.0 - 36.0 g/dL   RDW 21.3 (H) 11.5 - 15.5 %   Platelets 69 (L) 150 - 400 K/uL    Comment: REPEATED TO VERIFY PLATELET COUNT CONFIRMED BY  SMEAR Immature Platelet Fraction may be clinically indicated, consider ordering this additional test [id number]    nRBC 12.0 (H) 0.0 - 0.2 %    Comment: Performed at [facility], [address] [place], [place] [address]  Glucose, capillary     Status: Abnormal   Collection Time: 10/10/19  7:45 AM  Result Value Ref Range   Glucose-Capillary 158 (H) 70 - 99 mg/dL  Glucose, capillary     Status: Abnormal   Collection Time: 10/10/19 11:30 AM  Result Value Ref Range   Glucose-Capillary 191 (H) 70 - 99 mg/dL    CT ABDOMEN PELVIS W CONTRAST  Addendum Date:  10/10/2019   ADDENDUM REPORT: 10/10/2019 12:47 ADDENDUM: Findings called to Dr. Jennet Maduro. Electronically Signed   By: Dorise Bullion III M.D   On: 10/10/2019 12:47   Result Date: 10/10/2019 CLINICAL DATA:  Liver disease. Chronic portal hypertension assessment. Evaluate for fluid collection. EXAM: CT ABDOMEN AND PELVIS WITH CONTRAST TECHNIQUE: Multidetector CT imaging of the abdomen and pelvis was performed using the standard protocol following bolus administration of intravenous contrast. CONTRAST:  177mL OMNIPAQUE IOHEXOL 300 MG/ML  SOLN COMPARISON:  October 05, 2019 FINDINGS: Lower chest: No acute abnormality. Hepatobiliary: Severe hepatic steatosis is identified. The liver does not demonstrate a definitive nodular contour. Hepatomegaly is identified. The liver measures 27 cm in cranial caudal dimension. Focal fatty sparing is seen adjacent to the gallbladder. The gallbladder wall is prominent. A small amount of pericholecystic fluid is not excluded. The gallbladder is otherwise unremarkable. The portal vein is patent. Pancreas: Unremarkable. No pancreatic ductal dilatation or surrounding inflammatory changes. Spleen: The spleen measures 14 cm in cranial caudal dimension, prominent. Adrenals/Urinary Tract: There is a small probable cyst in the right kidney, too small to characterize. No other renal masses identified. No hydronephrosis or perinephric stranding. The ureters are normal in caliber with no stones. The bladder is decompressed with a Foley catheter. Air in the bladder is consistent with a Foley catheter. Stomach/Bowel: There is a feeding tube in the stomach which terminates in the distal third portion of the duodenum. The stomach is normal in appearance. There is no evidence of small-bowel obstruction. The distal small bowel is thick walled and mildly prominent caliber measuring 2.9 cm in diameter. The colon is grossly abnormal with pneumatosis throughout the entire length of the colon from cecum  through sigmoid colon. The patient is status post appendectomy. Vascular/Lymphatic: The abdominal aorta is normal in caliber with no atherosclerosis. No adenopathy. Reproductive: Uterus and bilateral adnexa are unremarkable. Other: There is ascites in the abdomen, particularly in the pelvis and extending down the pericolic gutters. There is air in the pericolonic fat, likely within veins given its distribution, particularly in the left side of the abdomen, likely secondary to the pneumatosis. No free air is identified. Musculoskeletal: No acute or significant osseous findings. IMPRESSION: 1. Pneumatosis throughout the entire length of the colon. There is gas in the intraperitoneal fat adjacent to the left side of the colon, likely venous. This gas does not extend into the SMV or portal vein. The etiology of the pneumatosis and venous gas is unclear. This could represent sequela of severe enterocolitis. Vascular etiologies are considered less likely given the patient's young age and lack of obvious vascular disease. Recommend clinical correlation. 2. Prominent thick walled loops of small bowel in the right lower quadrant are likely secondary to the same process affecting the colon. 3. Gallbladder wall thickening. There is a possible small amount  of pericholecystic fluid. Given the history of liver disease and the ascites, these findings are nonspecific. If there is concern for acute cholecystitis, a right upper quadrant ultrasound would be more sensitive and specific. 4. Ascites. Hepatomegaly. The spleen is borderline to mildly enlarged as well. 5. Severe hepatic steatosis. 6. Gallbladder wall thickening. There is a possible small amount of pericholecystic fluid. Given the history of liver disease and the ascites, these findings are nonspecific. If there is concern for acute cholecystitis, a right upper quadrant ultrasound would be more sensitive and specific. 7. No other abnormalities. Findings will be called to the  referring physician. Electronically Signed: By: Dorise Bullion III M.D On: 10/10/2019 12:39    Review of Systems  Unable to perform ROS: Mental status change   Blood pressure (!) 111/57, pulse 95, temperature 100 F (37.8 C), resp. rate 14, height 5\' 3"  (1.6 m), weight 89 kg, SpO2 97 %. Physical Exam  Constitutional: She appears well-developed and well-nourished.  HENT:  Head: Normocephalic.  Mouth/Throat: Oropharynx is clear and moist.  Eyes: Scleral icterus is present.  Cardiovascular: Regular rhythm and normal heart sounds.  Respiratory: Effort normal and breath sounds normal. No respiratory distress. She has no wheezes. She has no rales.  GI: She exhibits distension. There is no abdominal tenderness. There is no rebound and no guarding.  Musculoskeletal:        General: Edema present.     Cervical back: Neck supple.  Neurological:  Confused  Skin: Skin is warm.    Assessment/Plan: Acute on chronic liver failure, severe -on steroids per GI Diffuse colonic enterocolitis with pneumatosis -not in shock and no peritonitis. Recommend: hold tube feeds, Zosyn IV  With her liver failure, she has an 84% operative mortality.  She is not a surgical candidate.  Fortunately, our hand is not forced at this time.  She does not have peritonitis.  Recommend bowel rest and IV antibiotics as above.  We will follow.  I discussed with Dr. Nelda Marseille on the unit. Poor prognosis.  Zenovia Jarred 10/10/2019, 2:25 PM

## 2019-10-11 ENCOUNTER — Encounter (HOSPITAL_COMMUNITY): Payer: Self-pay | Admitting: Pulmonary Disease

## 2019-10-11 ENCOUNTER — Inpatient Hospital Stay (HOSPITAL_COMMUNITY): Payer: Self-pay

## 2019-10-11 DIAGNOSIS — R0902 Hypoxemia: Secondary | ICD-10-CM

## 2019-10-11 LAB — BASIC METABOLIC PANEL
Anion gap: 14 (ref 5–15)
BUN: 50 mg/dL — ABNORMAL HIGH (ref 6–20)
CO2: 23 mmol/L (ref 22–32)
Calcium: 7.5 mg/dL — ABNORMAL LOW (ref 8.9–10.3)
Chloride: 112 mmol/L — ABNORMAL HIGH (ref 98–111)
Creatinine, Ser: 1.51 mg/dL — ABNORMAL HIGH (ref 0.44–1.00)
GFR calc Af Amer: 52 mL/min — ABNORMAL LOW (ref >60)
GFR calc non Af Amer: 45 mL/min — ABNORMAL LOW (ref >60)
Glucose, Bld: 90 mg/dL (ref 70–99)
Potassium: 4 mmol/L (ref 3.5–5.1)
Sodium: 149 mmol/L — ABNORMAL HIGH (ref 135–145)

## 2019-10-11 LAB — PHOSPHORUS: Phosphorus: 5 mg/dL — ABNORMAL HIGH (ref 2.5–4.6)

## 2019-10-11 LAB — RENAL FUNCTION PANEL
Albumin: 1.6 g/dL — ABNORMAL LOW (ref 3.5–5.0)
Anion gap: 10 (ref 5–15)
BUN: 52 mg/dL — ABNORMAL HIGH (ref 6–20)
CO2: 23 mmol/L (ref 22–32)
Calcium: 7.8 mg/dL — ABNORMAL LOW (ref 8.9–10.3)
Chloride: 120 mmol/L — ABNORMAL HIGH (ref 98–111)
Creatinine, Ser: 1.41 mg/dL — ABNORMAL HIGH (ref 0.44–1.00)
GFR calc Af Amer: 57 mL/min — ABNORMAL LOW (ref >60)
GFR calc non Af Amer: 49 mL/min — ABNORMAL LOW (ref >60)
Glucose, Bld: 149 mg/dL — ABNORMAL HIGH (ref 70–99)
Phosphorus: 4.7 mg/dL — ABNORMAL HIGH (ref 2.5–4.6)
Potassium: 3.2 mmol/L — ABNORMAL LOW (ref 3.5–5.1)
Sodium: 153 mmol/L — ABNORMAL HIGH (ref 135–145)

## 2019-10-11 LAB — C DIFFICILE QUICK SCREEN W PCR REFLEX
C Diff antigen: NEGATIVE
C Diff interpretation: NOT DETECTED
C Diff toxin: NEGATIVE

## 2019-10-11 LAB — CULTURE, BLOOD (ROUTINE X 2)
Culture: NO GROWTH
Culture: NO GROWTH
Special Requests: ADEQUATE
Special Requests: ADEQUATE

## 2019-10-11 LAB — GLUCOSE, CAPILLARY
Glucose-Capillary: 111 mg/dL — ABNORMAL HIGH (ref 70–99)
Glucose-Capillary: 113 mg/dL — ABNORMAL HIGH (ref 70–99)
Glucose-Capillary: 128 mg/dL — ABNORMAL HIGH (ref 70–99)
Glucose-Capillary: 83 mg/dL (ref 70–99)
Glucose-Capillary: 91 mg/dL (ref 70–99)
Glucose-Capillary: 96 mg/dL (ref 70–99)

## 2019-10-11 LAB — HEPATIC FUNCTION PANEL
ALT: 62 U/L — ABNORMAL HIGH (ref 0–44)
AST: 143 U/L — ABNORMAL HIGH (ref 15–41)
Albumin: 1.7 g/dL — ABNORMAL LOW (ref 3.5–5.0)
Alkaline Phosphatase: 89 U/L (ref 38–126)
Bilirubin, Direct: 18.7 mg/dL — ABNORMAL HIGH (ref 0.0–0.2)
Indirect Bilirubin: 10.9 mg/dL — ABNORMAL HIGH (ref 0.3–0.9)
Total Bilirubin: 29.6 mg/dL (ref 0.3–1.2)
Total Protein: 6.8 g/dL (ref 6.5–8.1)

## 2019-10-11 LAB — CBC
HCT: 28.1 % — ABNORMAL LOW (ref 36.0–46.0)
Hemoglobin: 8.2 g/dL — ABNORMAL LOW (ref 12.0–15.0)
MCH: 28 pg (ref 26.0–34.0)
MCHC: 29.2 g/dL — ABNORMAL LOW (ref 30.0–36.0)
MCV: 95.9 fL (ref 80.0–100.0)
Platelets: 125 10*3/uL — ABNORMAL LOW (ref 150–400)
RBC: 2.93 MIL/uL — ABNORMAL LOW (ref 3.87–5.11)
RDW: 22.1 % — ABNORMAL HIGH (ref 11.5–15.5)
WBC: 8.4 10*3/uL (ref 4.0–10.5)
nRBC: 6.5 % — ABNORMAL HIGH (ref 0.0–0.2)

## 2019-10-11 LAB — MAGNESIUM: Magnesium: 2.7 mg/dL — ABNORMAL HIGH (ref 1.7–2.4)

## 2019-10-11 LAB — PROTIME-INR
INR: 1.7 — ABNORMAL HIGH (ref 0.8–1.2)
Prothrombin Time: 20 seconds — ABNORMAL HIGH (ref 11.4–15.2)

## 2019-10-11 LAB — CULTURE, BODY FLUID W GRAM STAIN -BOTTLE: Culture: NO GROWTH

## 2019-10-11 LAB — AMMONIA: Ammonia: 58 umol/L — ABNORMAL HIGH (ref 9–35)

## 2019-10-11 LAB — IGG: IgG (Immunoglobin G), Serum: 2307 mg/dL — ABNORMAL HIGH (ref 586–1602)

## 2019-10-11 MED ORDER — SODIUM CHLORIDE 0.9% FLUSH
10.0000 mL | Freq: Two times a day (BID) | INTRAVENOUS | Status: DC
  Administered 2019-10-11 – 2019-10-14 (×5): 10 mL

## 2019-10-11 MED ORDER — DEXTROSE-NACL 5-0.2 % IV SOLN
INTRAVENOUS | Status: DC
  Filled 2019-10-11: qty 1000

## 2019-10-11 MED ORDER — INSULIN ASPART 100 UNIT/ML ~~LOC~~ SOLN
0.0000 [IU] | SUBCUTANEOUS | Status: DC
  Administered 2019-10-11: 16:00:00 2 [IU] via SUBCUTANEOUS
  Administered 2019-10-12: 3 [IU] via SUBCUTANEOUS
  Administered 2019-10-12: 2 [IU] via SUBCUTANEOUS
  Administered 2019-10-13: 1 [IU] via SUBCUTANEOUS
  Administered 2019-10-13: 2 [IU] via SUBCUTANEOUS

## 2019-10-11 MED ORDER — POTASSIUM CHLORIDE 10 MEQ/100ML IV SOLN
10.0000 meq | INTRAVENOUS | Status: AC
  Administered 2019-10-11 (×4): 10 meq via INTRAVENOUS
  Filled 2019-10-11 (×4): qty 100

## 2019-10-11 MED ORDER — SODIUM CHLORIDE 0.9% FLUSH: 10.0000 mL | INTRAVENOUS | Status: DC | PRN

## 2019-10-11 NOTE — Progress Notes (Addendum)
Subjective/Chief Complaint: Confused, complains only that she has to urinate, no abd pain   Objective: Vital signs in last 24 hours: Temp:  [99 F (37.2 C)-100.8 F (38.2 C)] 99 F (37.2 C) (01/31 0700) Pulse Rate:  [78-108] 102 (01/31 0700) Resp:  [12-26] 14 (01/31 0700) BP: (78-120)/(45-78) 120/78 (01/31 0700) SpO2:  [96 %-100 %] 97 % (01/31 0700) Last BM Date: 10/10/19  Intake/Output from previous day: 01/30 0701 - 01/31 0700 In: 774.1 [NG/GT:443; IV Piggyback:331.1] Out: 3093 [Urine:593; Stool:2500] Intake/Output this shift: No intake/output data recorded.  General appearance: jaundice GI: nontender, mild distended  Lab Results:  Recent Labs    10/10/19 0542 10/11/19 0536  WBC 7.3 8.4  HGB 7.2* 8.2*  HCT 24.9* 28.1*  PLT 69* 125*   BMET Recent Labs    10/10/19 0542 10/10/19 1634  NA 142 145  K 3.1* 3.8  CL 106 110  CO2 24 23  GLUCOSE 170* 128*  BUN 25* 39*  CREATININE 0.77 1.46*  CALCIUM 6.7* 7.2*   PT/INR Recent Labs    10/10/19 0542 10/11/19 0536  LABPROT 21.5* 20.0*  INR 1.9* 1.7*   ABG Recent Labs    10/08/19 0817  PHART 7.549*  HCO3 26.8    Studies/Results: CT ABDOMEN PELVIS W CONTRAST  Addendum Date: 10/10/2019   ADDENDUM REPORT: 10/10/2019 12:47 ADDENDUM: Findings called to Dr. Jennet Maduro. Electronically Signed   By: Dorise Bullion III M.D   On: 10/10/2019 12:47   Result Date: 10/10/2019 CLINICAL DATA:  Liver disease. Chronic portal hypertension assessment. Evaluate for fluid collection. EXAM: CT ABDOMEN AND PELVIS WITH CONTRAST TECHNIQUE: Multidetector CT imaging of the abdomen and pelvis was performed using the standard protocol following bolus administration of intravenous contrast. CONTRAST:  164mL OMNIPAQUE IOHEXOL 300 MG/ML  SOLN COMPARISON:  October 05, 2019 FINDINGS: Lower chest: No acute abnormality. Hepatobiliary: Severe hepatic steatosis is identified. The liver does not demonstrate a definitive nodular contour.  Hepatomegaly is identified. The liver measures 27 cm in cranial caudal dimension. Focal fatty sparing is seen adjacent to the gallbladder. The gallbladder wall is prominent. A small amount of pericholecystic fluid is not excluded. The gallbladder is otherwise unremarkable. The portal vein is patent. Pancreas: Unremarkable. No pancreatic ductal dilatation or surrounding inflammatory changes. Spleen: The spleen measures 14 cm in cranial caudal dimension, prominent. Adrenals/Urinary Tract: There is a small probable cyst in the right kidney, too small to characterize. No other renal masses identified. No hydronephrosis or perinephric stranding. The ureters are normal in caliber with no stones. The bladder is decompressed with a Foley catheter. Air in the bladder is consistent with a Foley catheter. Stomach/Bowel: There is a feeding tube in the stomach which terminates in the distal third portion of the duodenum. The stomach is normal in appearance. There is no evidence of small-bowel obstruction. The distal small bowel is thick walled and mildly prominent caliber measuring 2.9 cm in diameter. The colon is grossly abnormal with pneumatosis throughout the entire length of the colon from cecum through sigmoid colon. The patient is status post appendectomy. Vascular/Lymphatic: The abdominal aorta is normal in caliber with no atherosclerosis. No adenopathy. Reproductive: Uterus and bilateral adnexa are unremarkable. Other: There is ascites in the abdomen, particularly in the pelvis and extending down the pericolic gutters. There is air in the pericolonic fat, likely within veins given its distribution, particularly in the left side of the abdomen, likely secondary to the pneumatosis. No free air is identified. Musculoskeletal: No acute or significant  osseous findings. IMPRESSION: 1. Pneumatosis throughout the entire length of the colon. There is gas in the intraperitoneal fat adjacent to the left side of the colon, likely  venous. This gas does not extend into the SMV or portal vein. The etiology of the pneumatosis and venous gas is unclear. This could represent sequela of severe enterocolitis. Vascular etiologies are considered less likely given the patient's young age and lack of obvious vascular disease. Recommend clinical correlation. 2. Prominent thick walled loops of small bowel in the right lower quadrant are likely secondary to the same process affecting the colon. 3. Gallbladder wall thickening. There is a possible small amount of pericholecystic fluid. Given the history of liver disease and the ascites, these findings are nonspecific. If there is concern for acute cholecystitis, a right upper quadrant ultrasound would be more sensitive and specific. 4. Ascites. Hepatomegaly. The spleen is borderline to mildly enlarged as well. 5. Severe hepatic steatosis. 6. Gallbladder wall thickening. There is a possible small amount of pericholecystic fluid. Given the history of liver disease and the ascites, these findings are nonspecific. If there is concern for acute cholecystitis, a right upper quadrant ultrasound would be more sensitive and specific. 7. No other abnormalities. Findings will be called to the referring physician. Electronically Signed: By: Dorise Bullion III M.D On: 10/10/2019 12:39    Anti-infectives: Anti-infectives (From admission, onward)   Start     Dose/Rate Route Frequency Ordered Stop   10/06/19 2200  rifaximin (XIFAXAN) tablet 550 mg  Status:  Discontinued     550 mg Per Tube 2 times daily 10/06/19 0935 10/06/19 1014   10/06/19 1300  piperacillin-tazobactam (ZOSYN) IVPB 3.375 g     3.375 g 12.5 mL/hr over 240 Minutes Intravenous Every 8 hours 10/06/19 0121     10/06/19 1015  rifaximin (XIFAXAN) tablet 550 mg     550 mg Per Tube 2 times daily 10/06/19 1014     10/06/19 0200  piperacillin-tazobactam (ZOSYN) IVPB 3.375 g  Status:  Discontinued     3.375 g 12.5 mL/hr over 240 Minutes Intravenous  Every 8 hours 10/06/19 0120 10/06/19 0121   10/06/19 0130  piperacillin-tazobactam (ZOSYN) IVPB 3.375 g     3.375 g 100 mL/hr over 30 Minutes Intravenous STAT 10/06/19 0121 10/06/19 0249      Assessment/Plan: Acute on chronic liver failure, severe Ardine Eng c cirrhosis, meld 29 -on steroids per GI Diffuse colonic enterocolitis with pneumatosis -not in shock and no peritonitis. Recommend: hold tube feeds, Zosyn IV. Continue to follow, she does not have any indicators of needing to go to OR at this point if evaluated using PIPES criteria.  Her mortality is significant and if comes to operation likely should be palliative.  Cannot fix her liver failure with surgery. Would be cautious about paracentesis now  Rolm Bookbinder 10/11/2019

## 2019-10-11 NOTE — Progress Notes (Signed)
[facility] Progress Note Patient Name: Dawn Foley DOB: [birth year] MRN: [id number]   Date of Service  10/11/2019  HPI/Events of Note  K 3.2, creatinine 1.4 (was 0.77 yesterday)  eICU Interventions  40 meq IV Kcl  Repeat labs at 1.30 am Please call with results      Intervention Category Intermediate Interventions: Electrolyte abnormality - evaluation and management  Margaretmary Lombard 10/11/2019, 7:47 PM

## 2019-10-11 NOTE — Plan of Care (Signed)
  Problem: Clinical Measurements: Goal: Diagnostic test results will improve Outcome: Progressing Goal: Respiratory complications will improve Outcome: Progressing Goal: Cardiovascular complication will be avoided Outcome: Progressing   Problem: Activity: Goal: Risk for activity intolerance will decrease Outcome: Progressing   Problem: Elimination: Goal: Will not experience complications related to bowel motility Outcome: Progressing Goal: Will not experience complications related to urinary retention Outcome: Progressing   Problem: Pain Managment: Goal: General experience of comfort will improve Outcome: Progressing   Problem: Safety: Goal: Ability to remain free from injury will improve Outcome: Progressing   

## 2019-10-11 NOTE — Progress Notes (Signed)
Patient presents for therapeutic and diagnostic paracentesis. US limited abdomen shows trace amount of peritoneal fluid.  Insufficient to perform a safe paracentesis. Procedure not performed.  

## 2019-10-11 NOTE — Progress Notes (Signed)
Progress Note   Subjective  Chief Complaint: Acute on chronic liver disease, new colitis  Patient is mumbling things and does not know where she is or who she is, is not really making sense.  Does tell me that her abdomen hurts and is requesting water.  Per nursing patient just came back from ultrasound and then they were unable to get any fluid off of her stomach and said that it was "just liver in there".  Stools have been sent for studies per our order earlier.  Her enteric feeds have been cut off for now given colitis.   Objective   Vital signs in last 24 hours: Temp:  [98.4 F (36.9 C)-100.8 F (38.2 C)] 99.3 F (37.4 C) (01/31 1000) Pulse Rate:  [86-108] 107 (01/31 1000) Resp:  [12-26] 21 (01/31 1000) BP: (92-122)/(50-78) 121/75 (01/31 1000) SpO2:  [96 %-100 %] 99 % (01/31 1000) Last BM Date: 10/11/19 General: Jaundiced, ill-appearing, white female in NAD Heart:  Regular rate and rhythm; no murmurs Lungs: Respirations even and unlabored, lungs CTA bilaterally Abdomen:  Soft, moderate abdominal distention, moderate generalized TTP, prominent hepatomegaly. Normal bowel sounds. Extremities: +2-3 generalized edema Neurologic:  Alert   Intake/Output from previous day: 01/30 0701 - 01/31 0700 In: 774.1 [NG/GT:443; IV Piggyback:331.1] Out: 3093 [Urine:593; Stool:2500] Intake/Output this shift: Total I/O In: 0  Out: 175 [Urine:175]  Lab Results: Recent Labs    10/09/19 0600 10/10/19 0542 10/11/19 0536  WBC 7.7 7.3 8.4  HGB 7.8* 7.2* 8.2*  HCT 25.7* 24.9* 28.1*  PLT 64* 69* 125*   BMET Recent Labs    10/10/19 0542 10/10/19 1634 10/11/19 0536  NA 142 145 149*  K 3.1* 3.8 4.0  CL 106 110 112*  CO2 _0 GLUCOSE 170* 128* 90  BUN 25* 39* 50*  CREATININE 0.77 1.46* 1.51*  CALCIUM 6.7* 7.2* 7.5*   Hepatic Function Latest Ref Rng & Units 10/11/2019 10/10/2019 10/10/2019  Total Protein 6.5 - 8.1 g/dL 6.8 - 5.6(L)  Albumin 3.5 - 5.0 g/dL 1.7(L) 1.5(L)  1.4(L)  AST 15 - 41 U/L 143(H) - 128(H)  ALT 0 - 44 U/L 62(H) - 54(H)  Alk Phosphatase 38 - 126 U/L 89 - 99  Total Bilirubin 0.3 - 1.2 mg/dL 29.6(HH) - 24.4(HH)  Bilirubin, Direct 0.0 - 0.2 mg/dL 18.7(H) - 16.0(H)    PT/INR Recent Labs    10/10/19 0542 10/11/19 0536  LABPROT 21.5* 20.0*  INR 1.9* 1.7*    Studies/Results: CT ABDOMEN PELVIS W CONTRAST  Addendum Date: 10/10/2019   ADDENDUM REPORT: 10/10/2019 12:47 ADDENDUM: Findings called to Dr. Jennet Maduro. Electronically Signed   By: Dorise Bullion III M.D   On: 10/10/2019 12:47   Result Date: 10/10/2019 CLINICAL DATA:  Liver disease. Chronic portal hypertension assessment. Evaluate for fluid collection. EXAM: CT ABDOMEN AND PELVIS WITH CONTRAST TECHNIQUE: Multidetector CT imaging of the abdomen and pelvis was performed using the standard protocol following bolus administration of intravenous contrast. CONTRAST:  114m OMNIPAQUE IOHEXOL 300 MG/ML  SOLN COMPARISON:  October 05, 2019 FINDINGS: Lower chest: No acute abnormality. Hepatobiliary: Severe hepatic steatosis is identified. The liver does not demonstrate a definitive nodular contour. Hepatomegaly is identified. The liver measures 27 cm in cranial caudal dimension. Focal fatty sparing is seen adjacent to the gallbladder. The gallbladder wall is prominent. A small amount of pericholecystic fluid is not excluded. The gallbladder is otherwise unremarkable. The portal vein is patent. Pancreas: Unremarkable. No pancreatic ductal dilatation or surrounding  inflammatory changes. Spleen: The spleen measures 14 cm in cranial caudal dimension, prominent. Adrenals/Urinary Tract: There is a small probable cyst in the right kidney, too small to characterize. No other renal masses identified. No hydronephrosis or perinephric stranding. The ureters are normal in caliber with no stones. The bladder is decompressed with a Foley catheter. Air in the bladder is consistent with a Foley catheter.  Stomach/Bowel: There is a feeding tube in the stomach which terminates in the distal third portion of the duodenum. The stomach is normal in appearance. There is no evidence of small-bowel obstruction. The distal small bowel is thick walled and mildly prominent caliber measuring 2.9 cm in diameter. The colon is grossly abnormal with pneumatosis throughout the entire length of the colon from cecum through sigmoid colon. The patient is status post appendectomy. Vascular/Lymphatic: The abdominal aorta is normal in caliber with no atherosclerosis. No adenopathy. Reproductive: Uterus and bilateral adnexa are unremarkable. Other: There is ascites in the abdomen, particularly in the pelvis and extending down the pericolic gutters. There is air in the pericolonic fat, likely within veins given its distribution, particularly in the left side of the abdomen, likely secondary to the pneumatosis. No free air is identified. Musculoskeletal: No acute or significant osseous findings. IMPRESSION: 1. Pneumatosis throughout the entire length of the colon. There is gas in the intraperitoneal fat adjacent to the left side of the colon, likely venous. This gas does not extend into the SMV or portal vein. The etiology of the pneumatosis and venous gas is unclear. This could represent sequela of severe enterocolitis. Vascular etiologies are considered less likely given the patient's young age and lack of obvious vascular disease. Recommend clinical correlation. 2. Prominent thick walled loops of small bowel in the right lower quadrant are likely secondary to the same process affecting the colon. 3. Gallbladder wall thickening. There is a possible small amount of pericholecystic fluid. Given the history of liver disease and the ascites, these findings are nonspecific. If there is concern for acute cholecystitis, a right upper quadrant ultrasound would be more sensitive and specific. 4. Ascites. Hepatomegaly. The spleen is borderline to  mildly enlarged as well. 5. Severe hepatic steatosis. 6. Gallbladder wall thickening. There is a possible small amount of pericholecystic fluid. Given the history of liver disease and the ascites, these findings are nonspecific. If there is concern for acute cholecystitis, a right upper quadrant ultrasound would be more sensitive and specific. 7. No other abnormalities. Findings will be called to the referring physician. Electronically Signed: By: Dorise Bullion III M.D On: 10/10/2019 12:39   US Abdomen Limited  Result Date: 10/11/2019 CLINICAL DATA:  History of hepatitis C, now with concern for portal venous hypertension and ascites. Please perform ascites search ultrasound ultrasound-guided paracentesis as indicated. EXAM: LIMITED ABDOMEN ULTRASOUND FOR ASCITES TECHNIQUE: Limited ultrasound survey for ascites was performed in all four abdominal quadrants. COMPARISON:  CT abdomen pelvis-10/10/2019 FINDINGS: Sonographic evaluation of the abdomen demonstrates a trace amount fluid within the left lower abdominal quadrant, too small to allow for safe ultrasound-guided paracentesis. No paracentesis attempted. IMPRESSION: Trace amount of intra-abdominal ascites, too small to allow for safe ultrasound-guided paracentesis. No paracentesis attempted. Electronically Signed   By: Sandi Mariscal M.D.   On: 10/11/2019 10:58    Assessment / Plan:   Assessment: 1.  Acute liver failure, alcoholic hepatitis, hep C positive: Fatty liver, hepatomegaly on imaging currently and in the past, HCV qualitative positive, RNA not detected, ANA is positive but other AIH labs  are negative, several previous incidence of alcoholic hepatitis, discriminant function score 124, Prednisolone day 5, bilirubin and crea increasing 2.  Acute on chronic thrombocytopenia 3.  Coagulopathy: Improved with vitamin K 4.  Hepatic encephalopathy: Improving with aggressive lactulose and rifaximin 5.  Hyponatremia 6.  Macrocytic anemia 7.  Pelvic  ascites 8.  Mallory-Weiss tear with coffee-ground emesis on 08/2016 EGD 9.  Protein calorie malnutrition 10.  History of colon cancer in 2009 11.  Polysubstance abuse with history of alcohol withdrawal seizures and DTs  Plan: 1.  Continue supportive care 2.  Continue Prednisolone 3.  Would plan Lille score tomorrow as this will be 7 days of steroids 4.  Unable to achieve diagnostic paracentesis, apparently there was not enough fluid. 5.  Stool studies have been ordered including a GI pathogen panel and C. difficile which are pending 6.  Due to increasing creatinine and worries over HRS versus something else in this complicated patient will go ahead and consult nephrology 7.  Please await further recommendations from Dr. Tarri Glenn today  Thank you for your kind consultation.  We will continue to follow.   LOS: 6 days   Levin Erp  10/11/2019, 11:28 AM

## 2019-10-11 NOTE — Progress Notes (Signed)
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 Workup consistent with acute liver failure with multiple metabolic derangements and coagulopathies transferred to [facility] for higher level of care.    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, hypothyroidism   Significant Hospital Events   1/25 tx from [place] to [facility] 1/26 started on prednisolone, rifaxamin, had paracentesis of the left side of abdomen 70cc removed 1/28 Mental status, Ammonia level worse, bilirubin remains high while other lft's improve 1/29 Mental status and ammonia level improving, bilirubin down slightly, fever overnight, starting to withdrawal from alcohol Consults:  IR GI Procedures:  1/25 Foley >> 1/26 paracentesis Significant Diagnostic Tests:  1/25 [facility] >> no acute intracranial abnormality; small bilateral mastoid effusions  1/ 25 CT A/P >>  1. Severe hepatic steatosis and hepatomegaly 2. Moderate to large amount of free fluid within the pelvis extending into the right abdomen which may be mildly complicated/ complex  [id number] RUQ [place] >> 1. Gallbladder sludge.  No sonographic evidence for acute cholecystitis or bilary  dilation.  2. Enlarged echogenic liver suggesting fatty inflitration  Micro Data:  1/26 BC x2 >> 1/26 UC >>  Antimicrobials:  1/26 zosyn >>  Interim history/subjective:  Low grade fever overnight with Tmax of 100.8 CT results noted with GI and CCS recommendations noted Continues to require precedex for withdrawal  Objective   Blood pressure 120/78, pulse (!) 102, temperature 99 F (37.2 C), resp. rate 14, height 5\' 3"  (1.6 m), weight 89 kg, SpO2 97 %.        Intake/Output Summary (Last 24 hours) at 10/11/2019 0817 Last data filed at 10/11/2019 0600 Gross per 24 hour  Intake 701.6 ml  Output 2683 ml  Net -1981.4 ml   Filed Weights   10/07/19 0500 10/08/19 0500 10/09/19 0429  Weight: 89 kg 88.1 kg 89 kg    Examination: General:  Acutely ill appearing female, jaundiced, NAD HEENT: [place]/AT, PERRL, EOM-I and MMM, icteric Neuro: Lethargic but easily arousable and moving all ext to commands CV: RRR, Nl S1/S2 and -M/R/G PULM:  Diminished diffusely but clear from what is audible GI: protuberant, prominent hepatomegaly, soft, no rebound tenderness Extremities: Generalized edema Skin: Jaundiced  I reviewed abdominal CT myself, pneumatosis noted diffusely  Discussed with bedside RN and RT  Resolved Hospital Problem list     Assessment & Plan:   Acute liver failure   Hx Hep C (do not see that she has been treated), Chronic heavy ETOH abuse, MELD score 33- with 3 month 52.6% mortality rate.  acetaminophen, HIV, cortisol, acute hepatitis panel, CMV, EBV, HSV, varicella, CK, ANA, antimitochondrial ab, anti-smooth muscle antibody.  GI repeated these and added MM panel and IgG.  So far Hep A, B, neg, hep c reactive ab but no active infection, EBV labs suggest prior infection  but not active.  ANA and ribonucleoprotein returned positive.  Paracentesis of left side of abdomen performed 1/26 SAAG ratio low 0.8.  Ammonia and mental status worse 1/28, Ammonia level and mental status improved  1/29 P:  GI and CCS following, appreciate input Would like to see GI's note on pneumatosis prior to repeat para at this point as patient remains on broad spectrum abx Continue prednisolone  D/C vanc Continue zosyn ?repeat para, will defer to GI after their evaluation of the CT of the abdomen with pneumatosis Continue to follow up Acute liver failure workup per GI Trend hepatic function/coags/ammonia/ - daily  Continue lactulose/rifaxamin for hepatic encephalopathy LFTs in AM  Encephalopathy- hepatic and metabolic  High risk for DTs, unclear last ETOH use (ETOH neg 1/25) Hx alcohol related seizures.  Severe anemia, hyponatremia, also contributing Ammonia level at [facility] 133 Bilirubin peaked around 27 P:  Serial neuro exams Mental status and Ammonia level improving, continue lactulose/rifaxamin Precedex for etoh withdrawal but minimize dosing given encephalopathy as able Seizure precautions Keppra BID IV  Daily thiamine/ folate IV CIWA protocol with ativan  Severe Anemia, Thrombocytopenia - severe thrombocytopenia, anemia, INR 4 likely related to liver failure , seems to be mixture of chronic blood loss anemia and bone marrow suppression from alcohol use.  OSH labs noted for B12 > 1000, folate and TIBC normal, iron low 19 with low saturation of 6.2%, ferritin 326 hemoccult negative.  Has received 4 u PRBC's and 1FFP/1Platelets as of 1/26 am.   P:  Hgb stable confirming chronic losses, transfuse for Hgb >7 CBC in AM PPI BID Sepsis workup as below  High risk for airway compromise related to encephalopathy  P:  Monitor  Supplemental O2 for sat of 88-92% Monitor closely for airway protection concerns  Right sided Pelvic/abdominal complex/ complicated fluid collection Small volume ascites does not seem to communicate with pelvic fluid. Left sided abdominal paracentesis performed 1/26 70cc fluid removed.   P:  CT noted with pneumatosis CCS consulted, appreciate input, watching  for now since very high mortality from surgery noted Continue zosyn Trend WBC/ fever curve Follow up cultures ngtd NPO If continues to be NPO by AM will consider TPN  Electrolyte Abnormalities Hyponatremia Hypokalemia Hypomagnesemia Hypophosphatemia At risk for AKI P:  BID RFP- slowly corrected and has resolved Continue foley/ strict I/Os Electrolyte replacement PRN  Severe malnutrition, Hypoglycemia likely related to liver dysfunction and alcohol use disorder.  At risk for refeeding syndrome P:  Serial cbg's SSI NPO May need TPN by AM if continues to be NPO per CCS recommendations (likely)    Hx depression, polysubstance abuse, schizophrenia P:  Holding home haldol, seroquel, trazadone with encephalopathy  Hypothyroidism: TSH elevated to 12 P:  Continue synthroid, dosage adequate given normalization of free T4  May need to begin discussions regarding [place] in AM when family is available  Best practice:  Diet: Tube feeds Pain/Anxiety/Delirium protocol (if indicated): CIWA ICU VAP protocol (if indicated): n/a DVT prophylaxis: SCD GI prophylaxis: PPI BID Glucose control: cbg monitoring Mobility: BR Code Status: Full  Family Communication: mother updated by phone  Disposition: ICU  Labs   CBC: Recent Labs  Lab 10/06/19 0020 10/06/19 0851 10/08/19 0134 10/08/19 0134 10/08/19 0817 10/08/19 1629 10/09/19 0600 10/10/19 0542 10/11/19 0536  WBC 4.8   < > 5.8  --   --  6.9 7.7 7.3 8.4  NEUTROABS 3.8  --   --   --   --   --   --   --   --  HGB 5.9*   < > 8.0*   < > 9.2* 8.5* 7.8* 7.2* 8.2*  HCT 21.0*  27.6*   < > 25.3*   < > 27.0* 26.7* 25.7* 24.9* 28.1*  MCV 99.1   < > 91.7  --   --  91.1 93.8 95.8 95.9  PLT 17*   < > 41*  --   --  52* 64* 69* 125*   < > = values in this interval not displayed.    Basic Metabolic Panel: Recent Labs  Lab 10/06/19 0614 10/06/19 0851 10/07/19 0437 10/07/19 1500 10/08/19 0134 10/08/19 0710 10/08/19 1903 10/09/19 0600  10/09/19 1013 10/09/19 1753 10/10/19 0542 10/10/19 1634  NA 123*  123*   < > 126*   < > 132*   < > 136  --  139 141 142 145  K 3.4*   < > 3.4*   < > 3.4*   < > 4.3  --  3.1* 3.3* 3.1* 3.8  CL 89*   < > 93*   < > 100   < > 102  --  105 108 106 110  CO2 16*   < > 21*   < > 21*   < > 23  --  25 24 24 23   GLUCOSE 94   < > 156*   < > 172*   < > 165*  --  186* 230* 170* 128*  BUN 6   < > 5*   < > 5*   < > 10  --  13 15 25* 39*  CREATININE 0.57   < > 0.40*   < > 0.38*   < > 0.32*  --  0.51 0.52 0.77 1.46*  CALCIUM 8.2*   < > 7.7*   < > 7.3*   < > 7.0*  --  7.0* 6.8* 6.7* 7.2*  MG 2.0  --  1.7  --  1.6*  --   --  2.0  --   --  2.1  --   PHOS 1.1*   < > <1.0*   < > 2.0*   < > 2.2*  --  2.3* 4.3 3.5 3.3   < > = values in this interval not displayed.   GFR: Estimated Creatinine Clearance: 58.5 mL/min (A) (by C-G formula based on SCr of 1.46 mg/dL (H)). Recent Labs  Lab 10/06/19 0020 10/06/19 0851 10/07/19 0437 10/07/19 1500 10/08/19 0134 10/08/19 0134 10/08/19 1629 10/09/19 0600 10/09/19 1013 10/10/19 0542 10/11/19 0536  PROCALCITON 1.24  --  1.64  --  1.08  --   --   --  1.02  --   --   WBC 4.8   < >  --    < > 5.8   < > 6.9 7.7  --  7.3 8.4   < > = values in this interval not displayed.    Liver Function Tests: Recent Labs  Lab 10/06/19 0851 10/06/19 1314 10/07/19 0437 10/07/19 1500 10/08/19 0134 10/08/19 0710 10/09/19 0600 10/09/19 1013 10/09/19 1753 10/10/19 0542 10/10/19 1634  AST 1,588*  --  970*  --  464*  --  185*  --   --  128*  --   ALT 148*  --  127*  --  101*  --  64*  --   --  54*  --   ALKPHOS 120  --  113  --  130*  --  123  --   --  99  --  BILITOT 25.0*  --  27.2*  --  27.1*  --  25.2*  --   --  24.4*  --   PROT 6.9  --  6.7  --  6.5  --  5.7*  --   --  5.6*  --   ALBUMIN 2.1*  2.1*   < > 1.8*   < > 1.7*  1.7*   < > 1.5* 1.6* 1.6* 1.4*  1.4* 1.5*   < > = values in this interval not displayed.   Recent Labs  Lab 10/06/19 0020 10/06/19 0851    LIPASE 49  --   AMYLASE  --  79   Recent Labs  Lab 10/07/19 0428 10/08/19 0134 10/09/19 0600 10/10/19 0542 10/11/19 0536  AMMONIA 127* 163* 91* 87* 58*    ABG    Component Value Date/Time   PHART 7.549 (H) 10/08/2019 0817   PCO2ART 30.7 (L) 10/08/2019 0817   PO2ART 77.0 (L) 10/08/2019 0817   HCO3 26.8 10/08/2019 0817   TCO2 28 10/08/2019 0817   O2SAT 97.0 10/08/2019 0817     Coagulation Profile: Recent Labs  Lab 10/07/19 0437 10/08/19 0134 10/09/19 0600 10/10/19 0542 10/11/19 0536  INR 2.5* 2.2* 1.9* 1.9* 1.7*    Cardiac Enzymes: Recent Labs  Lab 10/06/19 0025  CKTOTAL 134    HbA1C: Hgb A1c MFr Bld  Date/Time Value Ref Range Status  08/28/2018 08:54 AM 4.9 4.8 - 5.6 % Final    Comment:    (NOTE) Pre diabetes:          5.7%-6.4% Diabetes:              >6.4% Glycemic control for   <7.0% adults with diabetes   11/06/2017 06:38 AM 4.7 (L) 4.8 - 5.6 % Final    Comment:    (NOTE) Pre diabetes:          5.7%-6.4% Diabetes:              >6.4% Glycemic control for   <7.0% adults with diabetes     CBG: Recent Labs  Lab 10/10/19 1535 10/10/19 2019 10/11/19 0006 10/11/19 0428 10/11/19 0759  GLUCAP 130* 88 96 91 83   The patient is critically ill with multiple organ systems failure and requires high complexity decision making for assessment and support, frequent evaluation and titration of therapies, application of advanced monitoring technologies and extensive interpretation of multiple databases.   Critical Care Time devoted to patient care services described in this note is  32  Minutes. This time reflects time of care of this signee Dr Jennet Maduro. This critical care time does not reflect procedure time, or teaching time or supervisory time of PA/NP/Med student/Med Resident etc but could involve care discussion time.  Rush Farmer, M.D. [facility] Pulmonary/Critical Care Medicine.

## 2019-10-11 NOTE — Plan of Care (Signed)
  Problem: Clinical Measurements: Goal: Will remain free from infection Outcome: Progressing Pt has not had any fevers throughout this shift.  Goal: Respiratory complications will improve Outcome: Progressing  Pt remains on room air, not requiring any supplemental oxygen. Problem: Elimination: Goal: Will not experience complications related to bowel motility Outcome: Progressing Goal: Will not experience complications related to urinary retention Outcome: Progressing   Problem: Safety: Goal: Ability to remain free from injury will improve Outcome: Progressing   Problem: Activity: Goal: Risk for activity intolerance will decrease Outcome: Not Progressing  Pt is able to move all extremities, however pt is extremely weak and deconditioned and unable to reposition herself on her own. RN repositioning pt Q2 hours. More movement tonight with upper extremities from last night, yet still very weak with limited movement.  Problem: Nutrition: Goal: Adequate nutrition will be maintained Outcome: Not Progressing  Pt continues to be NPO due to liver failure and GI issues. Pt constantly calls out for water. RN provides oral care with mouth swaps multiple times throughout the shift. Pt needs constant reminders for why she is not able to eat or drink anything. IVF added to help maintain blood glucose. Problem: Coping: Goal: Level of anxiety will decrease Outcome: Not Progressing  Pt is still extremely confused and irritable. Pt states she is in [facility], yet continuously calls out for her ex-boyfriend.

## 2019-10-11 NOTE — Consult Note (Addendum)
Renal Service Consult Note [facility]  Bellarose Cinderella Eaton 10/11/2019 Sol Blazing Requesting Physician:  Dr Nelda Marseille  Reason for Consult:  AKI HPI: The patient is a 34 y.o. year-old w/ hx of seizures, schizophrenia, tobacco use, hep C, etoh abuse, depression, colon Ca 2009 sp R hemicolectomy, and bipolar d/o admitted on 1/26 w/ AMS , jaundice, hx etoh abuse. Seen by GI suspected acute /chron liver disease due to ETOH w/ prob underlying HCV. CT scan showed no evidence portal HTN or cirrhosis but other features were suggestive of cirrhosis - low plt, low Na, hypersplenism, anemia, ascites, low alb and hepatic enceph w/ high NH3. She was started on steroid, had paracentesis, lactulose/ rifaximin and vit K and SPEP/UPEP and other liver tests were ordered.  Subsequently she developed severe diarrhea and CT showing diffuse colonic enterocolitis w/ pneumatosis.  Creat was 0.4 on admission but creat yest pm bumped up to 1.4 up this am is up to 1.51.  Asked to see pt for renal failure.   UOP has been stable around 500- 1000 cc/ day.  Pt BP's have been stable except for 1/29 evening to 1/30 evening, for about 24 hrs BP's were less than 90 systolic in the [id number] - [id number] - 90's.  Last last night and during the day today, BP's have normalized again. Patient had temp spike on 1/28 and drop/ hypothermia, which has now stabilized.  Meds reviewed, no acei/ ARB/ nsaids.   Pt seen in ICU.  Ox 2. No c/o's.      Past Medical History  Past Medical History:  Diagnosis Date  . Alcoholism ([place])   . Anemia   . Anxiety   . Arthritis   . Asthma    as a child  . Bipolar disorder ([place])   . Cancer ([place])    colon  . Chronic kidney disease   . Complication of anesthesia    woke up during colonoscopy  . Depression   . Elevated liver enzymes   . GERD (gastroesophageal reflux disease)   . H/O alcohol abuse    clean for 1 month as of 07/17/17  . Heart murmur    per [facility] per PT  .  Hepatitis    denies  . Hepatitis C   . Hypothyroidism   . Mallory-Weiss tear   . Nicotine dependence   . Schizophrenia ([place])   . Seizures ([place])    seizures - most recent 05/2017, supposed to be on Tegretol but can't afford  . Thrombocytopenia ([facility])    Overview:  alcoholism  . Tibial plateau fracture, right   . Upper GI bleed    Past Surgical History  Past Surgical History:  Procedure Laterality Date  . APPENDECTOMY    . DILATION AND CURETTAGE OF UTERUS    . ESOPHAGOGASTRODUODENOSCOPY (EGD) WITH PROPOFOL N/A 09/04/2016   Procedure: ESOPHAGOGASTRODUODENOSCOPY (EGD) WITH PROPOFOL;  Surgeon: Doran Stabler, MD;  Location: [place];  Service: Endoscopy;  Laterality: N/A;  . HARDWARE REMOVAL Right 06/18/2017   Procedure: REMOVAL RIGHT EXTERNAL FIXATOR;  Surgeon: Altamese Towanda, MD;  Location: [place];  Service: Orthopedics;  Laterality: Right;  . HERNIA REPAIR     umbilical hernia  . IR PARACENTESIS  10/06/2019  . KNEE CLOSED REDUCTION Right 06/18/2017   Procedure: CLOSED MANIPULATION UNDER ANESTHESIA RIGHT KNEE;  Surgeon: Altamese [place], MD;  Location: [place];  Service: Orthopedics;  Laterality: Right;  . ORIF FEMUR FRACTURE Right 07/18/2017   Procedure: REPAIR NONUNION WITH RIA;  Surgeon: Altamese [place], MD;  Location: [place];  Service: Orthopedics;  Laterality: Right;  . ORIF TIBIA PLATEAU     Family History  Family History  Problem Relation Age of Onset  . Mental illness Other   . Thyroid disease Other   . Alcoholism Brother    Social History  reports that she has been smoking cigarettes. She has been smoking about 1.00 pack per day. She has never used smokeless tobacco. She reports current alcohol use of about 16.0 standard drinks of alcohol per week. She reports that she does not use drugs. Allergies  Allergies  Allergen Reactions  . Ondansetron Nausea And Vomiting  . Fish Allergy Nausea And Vomiting  . Grapeseed Extract [Nutritional Supplements] Hives  . Other Rash     Polyester   Home medications Prior to Admission medications   Medication Sig Start Date End Date Taking? Authorizing Provider  haloperidol (HALDOL) 10 MG tablet Take 10 mg by mouth 3 (three) times daily.    [provider]  levETIRAcetam (KEPPRA) 500 MG tablet Take 1 tablet (500 mg total) by mouth 2 (two) times daily. Patient not taking: Reported on 10/06/2019 10/27/18   Johnn Hai, MD  levothyroxine (SYNTHROID, LEVOTHROID) 100 MCG tablet Take 1 tablet (100 mcg total) by mouth daily at 6 (six) AM. Patient not taking: Reported on 10/06/2019 09/02/18   Clapacs, Madie [place], MD  QUEtiapine (SEROQUEL) 300 MG tablet Take 300 mg by mouth 3 (three) times daily.    [provider]  traZODone (DESYREL) 50 MG tablet Take 50 mg by mouth at bedtime. 10/17/18   [provider]   Recent Labs  Lab 10/06/19 0020 10/06/19 0851 10/09/19 0600 10/10/19 0542 10/11/19 0536  WBC 4.8   < > 7.7 7.3 8.4  NEUTROABS 3.8  --   --   --   --   HGB 5.9*   < > 7.8* 7.2* 8.2*  HCT 21.0*  27.6*   < > 25.7* 24.9* 28.1*  MCV 99.1   < > 93.8 95.8 95.9  PLT 17*   < > 64* 69* 125*   < > = values in this interval not displayed.   Recent Labs  Lab 10/08/19 1903 10/09/19 1013 10/09/19 1753 10/10/19 0542 10/10/19 1634 10/11/19 0536 10/11/19 1629  NA 136 139 141 142 145 149* 153*  K 4.3 3.1* 3.3* 3.1* 3.8 4.0 3.2*  CL 102 105 108 106 110 112* 120*  CO2 23 25 24 24 23 23 23   GLUCOSE 165* 186* 230* 170* 128* 90 149*  BUN 10 13 15  25* 39* 50* 52*  CREATININE 0.32* 0.51 0.52 0.77 1.46* 1.51* 1.41*  CALCIUM 7.0* 7.0* 6.8* 6.7* 7.2* 7.5* 7.8*  PHOS 2.2* 2.3* 4.3 3.5 3.3 5.0* 4.7*   Iron/TIBC/Ferritin/ %Sat    Component Value Date/Time   IRON 14 (L) 10/09/2019 1013   TIBC 242 (L) 10/09/2019 1013   FERRITIN 161 10/09/2019 1013   IRONPCTSAT 6 (L) 10/09/2019 1013    Vitals:   10/11/19 1700 10/11/19 1800 10/11/19 1900 10/11/19 2000  BP: 121/71 136/82 122/80 132/84  Pulse: (!) 103 (!) 108 (!)  105 94  Resp: 19 15 15 14   Temp: 99 F (37.2 C) 99.1 F (37.3 C) 99.1 F (37.3 C) 99.1 F (37.3 C)  TempSrc:    Bladder  SpO2: 97% 98% 98% 98%  Weight:      Height:        Exam Gen NG tube, jaundiced, groggy, "2021", not sure  location or day of the wk No rash, cyanosis or gangrene Sclera anicteric, throat clear and dry  No jvd or bruits Chest clear bilat to bases RRR no MRG Abd soft ntnd no mass or ascites +bs GU defer MS no joint effusions or deformity Ext 1-2+ bilat dependent hip edema, no UE or pretib edema Neuro is groggy, as above    Home meds:  - haloperidol 10 tid/ quetiapine 300 tid/ trazodone 50 hs  - levetiracetam 500 bid  - levothyroxine 100 qid     UA 1/26 > neg protein, 5 ketone, 0-5 rbc/ epi, >50 wbc   CT abd 10/10/19 w/ contrast 100 cc > normal appearing kidneys w/o hydro, hepatomegaly and hepatic steatosis, some ascites mild        Assessment/ Plan: 1. AKI - due to hypotension/ shock, which was short-lived and has resolved.  Creat down this afternoon, making urine, suspect already recovering.  Looks dry and Na+ 153, will start hypotonic IVF's.  Will follow. F/u bmet in am 2. Hypernatremia - w/ cirrhosis physiology and intravasc vol depletion, as above 3. Acute/ chronic liver disease - etoh, hep C, per GI 4. Colitis - acute onset, per CT, f/b GI and gen surg 5. Hepatic enceph - getting lactulose, etc 6. Schizophrenia 7. H/o seizure d/o      Kelly Splinter  MD 10/11/2019, 8:30 PM

## 2019-10-12 DIAGNOSIS — K709 Alcoholic liver disease, unspecified: Secondary | ICD-10-CM

## 2019-10-12 DIAGNOSIS — R935 Abnormal findings on diagnostic imaging of other abdominal regions, including retroperitoneum: Secondary | ICD-10-CM

## 2019-10-12 DIAGNOSIS — E162 Hypoglycemia, unspecified: Secondary | ICD-10-CM

## 2019-10-12 DIAGNOSIS — K529 Noninfective gastroenteritis and colitis, unspecified: Secondary | ICD-10-CM

## 2019-10-12 DIAGNOSIS — N179 Acute kidney failure, unspecified: Secondary | ICD-10-CM

## 2019-10-12 DIAGNOSIS — E43 Unspecified severe protein-calorie malnutrition: Secondary | ICD-10-CM

## 2019-10-12 DIAGNOSIS — E876 Hypokalemia: Secondary | ICD-10-CM

## 2019-10-12 LAB — CBC
HCT: 25.5 % — ABNORMAL LOW (ref 36.0–46.0)
Hemoglobin: 7.4 g/dL — ABNORMAL LOW (ref 12.0–15.0)
MCH: 27.7 pg (ref 26.0–34.0)
MCHC: 29 g/dL — ABNORMAL LOW (ref 30.0–36.0)
MCV: 95.5 fL (ref 80.0–100.0)
Platelets: 126 10*3/uL — ABNORMAL LOW (ref 150–400)
RBC: 2.67 MIL/uL — ABNORMAL LOW (ref 3.87–5.11)
RDW: 21.8 % — ABNORMAL HIGH (ref 11.5–15.5)
WBC: 7.9 10*3/uL (ref 4.0–10.5)
nRBC: 4.9 % — ABNORMAL HIGH (ref 0.0–0.2)

## 2019-10-12 LAB — BASIC METABOLIC PANEL
Anion gap: 11 (ref 5–15)
BUN: 48 mg/dL — ABNORMAL HIGH (ref 6–20)
CO2: 23 mmol/L (ref 22–32)
Calcium: 8.1 mg/dL — ABNORMAL LOW (ref 8.9–10.3)
Chloride: 121 mmol/L — ABNORMAL HIGH (ref 98–111)
Creatinine, Ser: 1.28 mg/dL — ABNORMAL HIGH (ref 0.44–1.00)
GFR calc Af Amer: >60 mL/min (ref >60)
GFR calc non Af Amer: 55 mL/min — ABNORMAL LOW (ref >60)
Glucose, Bld: 120 mg/dL — ABNORMAL HIGH (ref 70–99)
Potassium: 2.7 mmol/L — CL (ref 3.5–5.1)
Sodium: 155 mmol/L — ABNORMAL HIGH (ref 135–145)

## 2019-10-12 LAB — GLUCOSE, CAPILLARY
Glucose-Capillary: 108 mg/dL — ABNORMAL HIGH (ref 70–99)
Glucose-Capillary: 108 mg/dL — ABNORMAL HIGH (ref 70–99)
Glucose-Capillary: 110 mg/dL — ABNORMAL HIGH (ref 70–99)
Glucose-Capillary: 115 mg/dL — ABNORMAL HIGH (ref 70–99)
Glucose-Capillary: 121 mg/dL — ABNORMAL HIGH (ref 70–99)
Glucose-Capillary: 123 mg/dL — ABNORMAL HIGH (ref 70–99)
Glucose-Capillary: 159 mg/dL — ABNORMAL HIGH (ref 70–99)

## 2019-10-12 LAB — HEPATIC FUNCTION PANEL
ALT: 51 U/L — ABNORMAL HIGH (ref 0–44)
AST: 104 U/L — ABNORMAL HIGH (ref 15–41)
Albumin: 1.6 g/dL — ABNORMAL LOW (ref 3.5–5.0)
Alkaline Phosphatase: 73 U/L (ref 38–126)
Bilirubin, Direct: 21.4 mg/dL — ABNORMAL HIGH (ref 0.0–0.2)
Indirect Bilirubin: 8.2 mg/dL — ABNORMAL HIGH (ref 0.3–0.9)
Total Bilirubin: 29.6 mg/dL (ref 0.3–1.2)
Total Protein: 6.4 g/dL — ABNORMAL LOW (ref 6.5–8.1)

## 2019-10-12 LAB — PHOSPHORUS: Phosphorus: 4.5 mg/dL (ref 2.5–4.6)

## 2019-10-12 LAB — CK: Total CK: 15 U/L — ABNORMAL LOW (ref 38–234)

## 2019-10-12 LAB — LACTATE DEHYDROGENASE: LDH: 346 U/L — ABNORMAL HIGH (ref 98–192)

## 2019-10-12 LAB — MAGNESIUM: Magnesium: 2.7 mg/dL — ABNORMAL HIGH (ref 1.7–2.4)

## 2019-10-12 LAB — AMMONIA: Ammonia: 30 umol/L (ref 9–35)

## 2019-10-12 MED ORDER — QUETIAPINE FUMARATE 50 MG PO TABS
100.0000 mg | ORAL_TABLET | Freq: Three times a day (TID) | ORAL | Status: DC
  Administered 2019-10-12 – 2019-10-14 (×9): 100 mg
  Filled 2019-10-12 (×10): qty 2

## 2019-10-12 MED ORDER — QUETIAPINE FUMARATE 50 MG PO TABS: 100.0000 mg | ORAL_TABLET | Freq: Three times a day (TID) | ORAL | Status: DC

## 2019-10-12 MED ORDER — LORAZEPAM 2 MG/ML IJ SOLN: 1.0000 mg | INTRAMUSCULAR | Status: DC

## 2019-10-12 MED ORDER — VITAMIN K1 10 MG/ML IJ SOLN
10.0000 mg | Freq: Once | INTRAVENOUS | Status: AC
  Administered 2019-10-13: 10 mg via INTRAVENOUS
  Filled 2019-10-12: qty 1

## 2019-10-12 MED ORDER — CHLORHEXIDINE GLUCONATE CLOTH 2 % EX PADS
6.0000 | MEDICATED_PAD | Freq: Every day | CUTANEOUS | Status: DC
  Administered 2019-10-13 – 2019-10-16 (×4): 6 via TOPICAL

## 2019-10-12 MED ORDER — PROCHLORPERAZINE EDISYLATE 10 MG/2ML IJ SOLN
5.0000 mg | Freq: Once | INTRAMUSCULAR | Status: AC
  Administered 2019-10-12: 5 mg via INTRAVENOUS
  Filled 2019-10-12: qty 1

## 2019-10-12 MED ORDER — PANTOPRAZOLE SODIUM 40 MG IV SOLR
40.0000 mg | INTRAVENOUS | Status: DC
  Administered 2019-10-12: 40 mg via INTRAVENOUS
  Filled 2019-10-12: qty 40

## 2019-10-12 MED ORDER — METRONIDAZOLE IN NACL 5-0.79 MG/ML-% IV SOLN
500.0000 mg | Freq: Three times a day (TID) | INTRAVENOUS | Status: DC
  Administered 2019-10-12 – 2019-10-15 (×9): 500 mg via INTRAVENOUS
  Filled 2019-10-12 (×9): qty 100

## 2019-10-12 MED ORDER — POTASSIUM CHLORIDE 20 MEQ/15ML (10%) PO SOLN
40.0000 meq | Freq: Once | ORAL | Status: AC
  Administered 2019-10-12: 40 meq
  Filled 2019-10-12: qty 30

## 2019-10-12 MED ORDER — ALBUMIN HUMAN 25 % IV SOLN
25.0000 g | Freq: Once | INTRAVENOUS | Status: AC
  Administered 2019-10-12: 25 g via INTRAVENOUS
  Filled 2019-10-12: qty 50

## 2019-10-12 MED ORDER — POTASSIUM CHLORIDE 10 MEQ/100ML IV SOLN
10.0000 meq | INTRAVENOUS | Status: AC
  Administered 2019-10-12 (×6): 10 meq via INTRAVENOUS
  Filled 2019-10-12 (×6): qty 100

## 2019-10-12 MED ORDER — LORAZEPAM 2 MG/ML IJ SOLN: 1.0000 mg | INTRAMUSCULAR | Status: DC | PRN

## 2019-10-12 MED ORDER — POTASSIUM CHLORIDE 10 MEQ/50ML IV SOLN: 10.0000 meq | INTRAVENOUS | Status: DC

## 2019-10-12 MED ORDER — DEXTROSE 5 % IV SOLN: INTRAVENOUS | Status: AC

## 2019-10-12 NOTE — Progress Notes (Signed)
CRITICAL VALUE ALERT  Critical Value:  Potassium 2.7  Date & Time Notified:  10/12/19 5:31 AM   Provider Notified: Lilian Kapur, RN  Orders Received/Actions taken: See new orders  Clint Bolder, RN 10/12/19 5:32 AM

## 2019-10-12 NOTE — Progress Notes (Signed)
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 Workup consistent with acute liver failure with multiple metabolic derangements and coagulopathies transferred to [facility] for higher level of care.    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, hypothyroidism   Significant Hospital Events   1/25 tx from [facility] to [facility] 1/26 started on prednisolone, rifaxamin, had paracentesis of the left side of abdomen 70cc removed 1/28 Mental status, Ammonia level worse, bilirubin remains high while other lft's improve 1/29 Mental status and ammonia level improving, bilirubin down slightly, fever overnight, starting to withdrawal from alcohol Consults:  IR GI Procedures:  1/25 [facility] >> 1/26 paracentesis Significant Diagnostic Tests:  1/25 [facility] >> no acute intracranial abnormality; small bilateral mastoid effusions  1/ 25 CT A/P >>  1. Severe hepatic steatosis and hepatomegaly 2. Moderate to large amount of free fluid within the pelvis extending into the right abdomen which may be mildly complicated/ complex  [id number] RUQ [place] >> 1. Gallbladder sludge.  No sonographic evidence for acute cholecystitis or bilary  dilation.  2. Enlarged echogenic liver suggesting fatty inflitration  Micro Data:  1/26 BC x2 >> NGTD 1/26 UC >> polymicrobial  Antimicrobials:  1/26 zosyn >>   Interim history/subjective:  Encephalopathic. Asking for ash tray and a beer this morning.   Objective   Blood pressure (!) 141/88, pulse 100, temperature 98.4 F (36.9 C), resp. rate 16, height 5\' 3"  (1.6 m), weight 89 kg, SpO2 97 %.        Intake/Output Summary (Last 24 hours) at 10/12/2019 0954 Last data filed at 10/12/2019 [id number] Gross per 24 hour  Intake 1781.11 ml  Output 2780 ml  Net -998.89 ml   Filed Weights   10/07/19 0500 10/08/19 0500 10/09/19 0429  Weight: 89 kg 88.1 kg 89 kg    Examination: General:  Acutely ill appearing female, jaundiced, NAD HEENT: [place]/AT, PERRL, EOM-I and MMM, icteric Neuro: Lethargic but easily arousable and moving all ext to commands CV: RRR, Nl S1/S2 and -M/R/G PULM:  Diminished diffusely but clear from what is audible GI: protuberant, prominent hepatomegaly, soft, no rebound tenderness Extremities: Generalized edema Skin: Jaundiced  I reviewed abdominal CT myself, pneumatosis noted diffusely  Discussed with bedside RN  Assessment & Plan:   Acute on Chronic Liver Failure   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 Lille score is high. Consider discontinuation will discuss with GI.  Daily Meld Labs  Acute Encephalopathy- hepatic and metabolic  High risk for DTs, unclear last ETOH use (ETOH neg 1/25) Hx alcohol related seizures.  Currently high CIWA scores P:  Continue lactulose/rifaxamin for hepatic encephalopathy down NG tube. Has flexiseal for adequate stool output.  Precedex for etoh  withdrawal but minimize dosing given encephalopathy as able. NO BENZOS.  Seizure precautions Keppra BID IV  Daily thiamine/ folate IV CIWA protocol with ativan - will stretch out to q4 hours.   Pneumatosis with diffuse severe enterocolitis - currently on piperacillin-tazobactam, has received 7 days.  - Would stop and monitor off zosyn given worsening hypernatremia and no signs. Will start flagyl instead. She will need long term abx and bowel rest.  - Small volume ascites Left sided abdominal paracentesis performed 1/26 70cc fluid removed.    AKI - renal protective measures - replace electrolytes as needed - at risk for HRS  Severe Anemia, Thrombocytopenia - severe thrombocytopenia, anemia, INR 4 likely related to liver failure.  P:  Hgb stable confirming chronic losses, transfuse for Hgb >7 CBC in AM Will switch PPI to daily.  High risk for airway compromise related to encephalopathy  P:  Monitor  Supplemental O2 for sat of 88-92% Monitor closely for airway protection concerns  Electrolyte Abnormalities Hyponatremia Hypokalemia Hypomagnesemia Hypophosphatemia P:  BID RFP- slowly corrected and has resolved Continue foley/ strict I/Os Electrolyte replacement PRN  Severe malnutrition, Hypoglycemia likely related to liver dysfunction and alcohol use disorder.  At risk for refeeding syndrome P:  Serial cbg's SSI NPO Consider TPN pending goals of care.   Hx depression, polysubstance abuse, schizophrenia P:  Currently holding home haldol, seroquel, trazadone  - would resume seroquel at least today to help encephalopathy  Hypothyroidism: TSH elevated to 12 P:  Continue synthroid, dosage adequate given normalization of free T4  The patient is critically ill with multiple organ systems failure and requires high complexity decision making for assessment and support, frequent evaluation and titration of therapies, application of advanced monitoring technologies and  extensive interpretation of multiple databases.   Critical Care Time devoted to patient care services described in this note is 45 minutes. This time reflects time of care of this [place]yer . This critical care time does not reflect separately billable procedures or procedure time, teaching time or supervisory time of PA/NP/Med student/Med Resident etc but could involve care discussion time.  [facility] 10/12/2019 9:54 AM  Pager: [phone] After hours pager: [phone]   Best practice:  Diet: Tube feeds Pain/Anxiety/Delirium protocol (if indicated): CIWA ICU VAP protocol (if indicated): n/a DVT prophylaxis: SCD GI prophylaxis: PPI BID, transition to daily.  Glucose control: cbg monitoring Mobility: BR Code Status: Full  Family Communication: will call mother to update today.  Disposition: ICU  Labs   CBC: Recent Labs  Lab 10/06/19 0020 10/06/19 0851 10/08/19 1629 10/09/19 0600 10/10/19 0542 10/11/19 0536 10/12/19 0431  WBC 4.8   < > 6.9 7.7 7.3 8.4 7.9  NEUTROABS 3.8  --   --   --   --   --   --   HGB 5.9*   < > 8.5* 7.8* 7.2* 8.2* 7.4*  HCT 21.0*  27.6*   < > 26.7* 25.7* 24.9* 28.1* 25.5*  MCV 99.1   < > 91.1 93.8 95.8 95.9 95.5  PLT 17*   < > 52* 64* 69* 125* 126*   < > = values in this interval not displayed.    Basic Metabolic Panel: Recent Labs  Lab 10/08/19 0134 10/08/19 0710 10/09/19 0600 10/09/19 1013 10/10/19 0542 10/10/19 1634 10/11/19 0536 10/11/19  1629 10/12/19 0431  NA 132*   < >  --    < > 142 145 149* 153* 155*  K 3.4*   < >  --    < > 3.1* 3.8 4.0 3.2* 2.7*  CL 100   < >  --    < > 106 110 112* 120* 121*  CO2 21*   < >  --    < > 24 23 23 23 23   GLUCOSE 172*   < >  --    < > 170* 128* 90 149* 120*  BUN 5*   < >  --    < > 25* 39* 50* 52* 48*  CREATININE 0.38*   < >  --    < > 0.77 1.46* 1.51* 1.41* 1.28*  CALCIUM 7.3*   < >  --    < > 6.7* 7.2* 7.5* 7.8* 8.1*  MG 1.6*  --   2.0  --  2.1  --  2.7*  --  2.7*  PHOS 2.0*   < >  --    < > 3.5 3.3 5.0* 4.7* 4.5   < > = values in this interval not displayed.   GFR: Estimated Creatinine Clearance: 66.7 mL/min (A) (by C-G formula based on SCr of 1.28 mg/dL (H)). Recent Labs  Lab 10/06/19 0020 10/06/19 0851 10/07/19 0437 10/07/19 1500 10/08/19 0134 10/08/19 1629 10/09/19 0600 10/09/19 1013 10/10/19 0542 10/11/19 0536 10/12/19 0431  PROCALCITON 1.24  --  1.64  --  1.08  --   --  1.02  --   --   --   WBC 4.8   < >  --    < > 5.8   < > 7.7  --  7.3 8.4 7.9   < > = values in this interval not displayed.    Liver Function Tests: Recent Labs  Lab 10/08/19 0134 10/08/19 0710 10/09/19 0600 10/09/19 1013 10/10/19 0542 10/10/19 1634 10/11/19 0536 10/11/19 1629 10/12/19 0431  AST 464*  --  185*  --  128*  --  143*  --  104*  ALT 101*  --  64*  --  54*  --  62*  --  51*  ALKPHOS 130*  --  123  --  99  --  89  --  73  BILITOT 27.1*  --  25.2*  --  24.4*  --  29.6*  --  29.6*  PROT 6.5  --  5.7*  --  5.6*  --  6.8  --  6.4*  ALBUMIN 1.7*  1.7*   < > 1.5*   < > 1.4*  1.4* 1.5* 1.7* 1.6* 1.6*   < > = values in this interval not displayed.   Recent Labs  Lab 10/06/19 0020 10/06/19 0851  LIPASE 49  --   AMYLASE  --  79   Recent Labs  Lab 10/08/19 0134 10/09/19 0600 10/10/19 0542 10/11/19 0536 10/12/19 0431  AMMONIA 163* 91* 87* 58* 30    ABG    Component Value Date/Time   PHART 7.549 (H) 10/08/2019 0817   PCO2ART 30.7 (L) 10/08/2019 0817   PO2ART 77.0 (L) 10/08/2019 0817   HCO3 26.8 10/08/2019 0817   TCO2 28 10/08/2019 0817   O2SAT 97.0 10/08/2019 0817     Coagulation Profile: Recent Labs  Lab 10/07/19 0437 10/08/19 0134 10/09/19 0600 10/10/19 0542 10/11/19 0536  INR 2.5* 2.2* 1.9* 1.9* 1.7*    Cardiac Enzymes: Recent Labs  Lab 10/06/19  0025  CKTOTAL 134    HbA1C: Hgb A1c MFr Bld  Date/Time Value Ref Range Status  08/28/2018 08:54 AM 4.9 4.8 - 5.6 % Final    Comment:      (NOTE) Pre diabetes:          5.7%-6.4% Diabetes:              >6.4% Glycemic control for   <7.0% adults with diabetes   11/06/2017 06:38 AM 4.7 (L) 4.8 - 5.6 % Final    Comment:    (NOTE) Pre diabetes:          5.7%-6.4% Diabetes:              >6.4% Glycemic control for   <7.0% adults with diabetes     CBG: Recent Labs  Lab 10/11/19 1558 10/11/19 1933 10/12/19 0016 10/12/19 0356 10/12/19 0719  GLUCAP 128* 113* 115* 108* 108*

## 2019-10-12 NOTE — Progress Notes (Signed)
[place] Progress Note Patient Name: Dawn Foley DOB: 09-11-1985 MRN: [id number]   Date of Service  10/12/2019  HPI/Events of Note  K 2.7, improving AKI  eICU Interventions  60 meq IV Kcl ordered Repeat labs after repletion      Intervention Category Major Interventions: Electrolyte abnormality - evaluation and management  Margaretmary Lombard 10/12/2019, 5:45 AM

## 2019-10-12 NOTE — Progress Notes (Signed)
Patient's mother Dawn Foley would like doctor to call in the morning to set up in person meeting regarding patients care. Would like a voicemail if she does not answer and will respond when she can. [phone].  [name]

## 2019-10-12 NOTE — Progress Notes (Signed)
Subjective/Chief Complaint: Patient awake, complaining of thirst Minimal abdominal pain Ammonia back to normal levels Having some loose bowel movements   Objective: Vital signs in last 24 hours: Temp:  [98.1 F (36.7 C)-99.5 F (37.5 C)] 99 F (37.2 C) (02/01 0500) Pulse Rate:  [94-114] 106 (02/01 0500) Resp:  [13-21] 18 (02/01 0500) BP: (102-136)/(67-96) 130/79 (02/01 0500) SpO2:  [92 %-100 %] 97 % (02/01 0500) Last BM Date: 10/12/19  Intake/Output from previous day: 01/31 0701 - 02/01 0700 In: 1318.6 [I.V.:538.5; IV Piggyback:780.1] Out: 2955 [Urine:1655; Stool:1300] Intake/Output this shift: No intake/output data recorded.  General appearance: cooperative, no distress, moderately obese and slowed mentation GI: soft, minimal distention; mild diffuse tenderness but no peritoneal signs Skin - obvious jaundice  Lab Results:  Recent Labs    10/11/19 0536 10/12/19 0431  WBC 8.4 7.9  HGB 8.2* 7.4*  HCT 28.1* 25.5*  PLT 125* 126*   BMET Recent Labs    10/11/19 1629 10/12/19 0431  NA 153* 155*  K 3.2* 2.7*  CL 120* 121*  CO2 23 23  GLUCOSE 149* 120*  BUN 52* 48*  CREATININE 1.41* 1.28*  CALCIUM 7.8* 8.1*   PT/INR Recent Labs    10/10/19 0542 10/11/19 0536  LABPROT 21.5* 20.0*  INR 1.9* 1.7*   ABG No results for input(s): PHART, HCO3 in the last 72 hours.  Invalid input(s): PCO2, PO2  Studies/Results: CT ABDOMEN PELVIS W CONTRAST  Addendum Date: 10/10/2019   ADDENDUM REPORT: 10/10/2019 12:47 ADDENDUM: Findings called to Dr. Jennet Maduro. Electronically Signed   By: Dorise Bullion III M.D   On: 10/10/2019 12:47   Result Date: 10/10/2019 CLINICAL DATA:  Liver disease. Chronic portal hypertension assessment. Evaluate for fluid collection. EXAM: CT ABDOMEN AND PELVIS WITH CONTRAST TECHNIQUE: Multidetector CT imaging of the abdomen and pelvis was performed using the standard protocol following bolus administration of intravenous contrast. CONTRAST:   128mL OMNIPAQUE IOHEXOL 300 MG/ML  SOLN COMPARISON:  October 05, 2019 FINDINGS: Lower chest: No acute abnormality. Hepatobiliary: Severe hepatic steatosis is identified. The liver does not demonstrate a definitive nodular contour. Hepatomegaly is identified. The liver measures 27 cm in cranial caudal dimension. Focal fatty sparing is seen adjacent to the gallbladder. The gallbladder wall is prominent. A small amount of pericholecystic fluid is not excluded. The gallbladder is otherwise unremarkable. The portal vein is patent. Pancreas: Unremarkable. No pancreatic ductal dilatation or surrounding inflammatory changes. Spleen: The spleen measures 14 cm in cranial caudal dimension, prominent. Adrenals/Urinary Tract: There is a small probable cyst in the right kidney, too small to characterize. No other renal masses identified. No hydronephrosis or perinephric stranding. The ureters are normal in caliber with no stones. The bladder is decompressed with a Foley catheter. Air in the bladder is consistent with a Foley catheter. Stomach/Bowel: There is a feeding tube in the stomach which terminates in the distal third portion of the duodenum. The stomach is normal in appearance. There is no evidence of small-bowel obstruction. The distal small bowel is thick walled and mildly prominent caliber measuring 2.9 cm in diameter. The colon is grossly abnormal with pneumatosis throughout the entire length of the colon from cecum through sigmoid colon. The patient is status post appendectomy. Vascular/Lymphatic: The abdominal aorta is normal in caliber with no atherosclerosis. No adenopathy. Reproductive: Uterus and bilateral adnexa are unremarkable. Other: There is ascites in the abdomen, particularly in the pelvis and extending down the pericolic gutters. There is air in the pericolonic fat, likely within veins  given its distribution, particularly in the left side of the abdomen, likely secondary to the pneumatosis. No free air is  identified. Musculoskeletal: No acute or significant osseous findings. IMPRESSION: 1. Pneumatosis throughout the entire length of the colon. There is gas in the intraperitoneal fat adjacent to the left side of the colon, likely venous. This gas does not extend into the SMV or portal vein. The etiology of the pneumatosis and venous gas is unclear. This could represent sequela of severe enterocolitis. Vascular etiologies are considered less likely given the patient's young age and lack of obvious vascular disease. Recommend clinical correlation. 2. Prominent thick walled loops of small bowel in the right lower quadrant are likely secondary to the same process affecting the colon. 3. Gallbladder wall thickening. There is a possible small amount of pericholecystic fluid. Given the history of liver disease and the ascites, these findings are nonspecific. If there is concern for acute cholecystitis, a right upper quadrant ultrasound would be more sensitive and specific. 4. Ascites. Hepatomegaly. The spleen is borderline to mildly enlarged as well. 5. Severe hepatic steatosis. 6. Gallbladder wall thickening. There is a possible small amount of pericholecystic fluid. Given the history of liver disease and the ascites, these findings are nonspecific. If there is concern for acute cholecystitis, a right upper quadrant ultrasound would be more sensitive and specific. 7. No other abnormalities. Findings will be called to the referring physician. Electronically Signed: By: Dorise Bullion III M.D On: 10/10/2019 12:39   US Abdomen Limited  Result Date: 10/11/2019 CLINICAL DATA:  History of hepatitis C, now with concern for portal venous hypertension and ascites. Please perform ascites search ultrasound ultrasound-guided paracentesis as indicated. EXAM: LIMITED ABDOMEN ULTRASOUND FOR ASCITES TECHNIQUE: Limited ultrasound survey for ascites was performed in all four abdominal quadrants. COMPARISON:  CT abdomen pelvis-10/10/2019  FINDINGS: Sonographic evaluation of the abdomen demonstrates a trace amount fluid within the left lower abdominal quadrant, too small to allow for safe ultrasound-guided paracentesis. No paracentesis attempted. IMPRESSION: Trace amount of intra-abdominal ascites, too small to allow for safe ultrasound-guided paracentesis. No paracentesis attempted. Electronically Signed   By: Sandi Mariscal M.D.   On: 10/11/2019 10:58    Anti-infectives: Anti-infectives (From admission, onward)   Start     Dose/Rate Route Frequency Ordered Stop   10/06/19 2200  rifaximin (XIFAXAN) tablet 550 mg  Status:  Discontinued     550 mg Per Tube 2 times daily 10/06/19 0935 10/06/19 1014   10/06/19 1300  piperacillin-tazobactam (ZOSYN) IVPB 3.375 g     3.375 g 12.5 mL/hr over 240 Minutes Intravenous Every 8 hours 10/06/19 0121     10/06/19 1015  rifaximin (XIFAXAN) tablet 550 mg     550 mg Per Tube 2 times daily 10/06/19 1014     10/06/19 0200  piperacillin-tazobactam (ZOSYN) IVPB 3.375 g  Status:  Discontinued     3.375 g 12.5 mL/hr over 240 Minutes Intravenous Every 8 hours 10/06/19 0120 10/06/19 0121   10/06/19 0130  piperacillin-tazobactam (ZOSYN) IVPB 3.375 g     3.375 g 100 mL/hr over 30 Minutes Intravenous STAT 10/06/19 0121 10/06/19 0249      Assessment/Plan: Acute on chronic liver failure, severe Childs C cirrhosis, Meld 29-on steroids per GI Diffuse colonic enterocolitis with pneumatosis-not in shock and no peritonitis. Recommend:hold tube feeds, Zosyn IV. Continue to follow.  She does not have any indicators of needing to go to OR at this point if evaluated using PIPES criteria.  Her mortality is significant and  if comes to operation likely should be palliative.  Cannot fix her liver failure with surgery.   May start ice chips/ sips of water  LOS: 7 days    Maia Petties 10/12/2019

## 2019-10-12 NOTE — Progress Notes (Signed)
eLink Physician-Brief Progress Note Patient Name: Dawn Foley DOB: [birth year] MRN: [id number]   Date of Service  10/12/2019  HPI/Events of Note  Nausea  eICU Interventions  Compazine      Intervention Category Minor Interventions: Routine modifications to care plan (e.g. PRN medications for pain, fever)  Jeremey Bascom G Micah Galeno 10/12/2019, 1:11 AM

## 2019-10-12 NOTE — Progress Notes (Signed)
CSW received consult for patient to provide substance abuse counseling and resources. CSW completed chart review - patient is having some difficulty speaking, per PA patient is mumbling and still quite ill.  CSW will continue monitoring chart and will complete assessment when more appropriate.   Madilyn Fireman, MSW, LCSW-A Transitions of Care  Clinical Social Worker  [facility] Emergency Departments  Medical ICU [phone]

## 2019-10-12 NOTE — Progress Notes (Addendum)
Daily Rounding Note  10/12/2019, 8:31 AM  LOS: 7 days   SUBJECTIVE:   Chief complaint:  Acute etoh hepatitis.   Remains more alert but still mumbling and very ill.   C/o pain in multiple locations, but interestingly not in her belly.   Feeling urgent need to have BM, she has flexiseal in place.  It's output yest: 1.3 liters.  OBJECTIVE:         Vital signs in last 24 hours:    Temp:  [98.1 F (36.7 C)-99.5 F (37.5 C)] 98.4 F (36.9 C) (02/01 0800) Pulse Rate:  [94-114] 100 (02/01 0800) Resp:  [13-21] 16 (02/01 0800) BP: (102-141)/(67-96) 141/88 (02/01 0800) SpO2:  [92 %-99 %] 97 % (02/01 0800) Last BM Date: 10/12/19 Filed Weights   10/07/19 0500 10/08/19 0500 10/09/19 0429  Weight: 89 kg 88.1 kg 89 kg   General: more alert, following commands.  Unable to understand bulk of what she is saying due to mumbliing   Heart: RRR Chest: clear bil in front.  No cough or dyspnea Abdomen: soft, ND, NT.  Hepatomegaly.  BS quiet.  ~ 400 mL watery medium to lighter brown stool in flexiseal pouch.    Extremities: non-pitting pedal edema Neuro/Psych:  Follows commands.  No asterixis, moves all 4 liimbs.    Intake/Output from previous day: 01/31 0701 - 02/01 0700 In: 1318.6 [I.V.:538.5; IV Piggyback:780.1] Out: 2955 [Urine:1655; Stool:1300]  Intake/Output this shift: Total I/O In: 462.5 [I.V.:242.9; IV Piggyback:219.6] Out: -   Lab Results: Recent Labs    10/10/19 0542 10/11/19 0536 10/12/19 0431  WBC 7.3 8.4 7.9  HGB 7.2* 8.2* 7.4*  HCT 24.9* 28.1* 25.5*  PLT 69* 125* 126*   BMET Recent Labs    10/11/19 0536 10/11/19 1629 10/12/19 0431  NA 149* 153* 155*  K 4.0 3.2* 2.7*  CL 112* 120* 121*  CO2 23 23 23   GLUCOSE 90 149* 120*  BUN 50* 52* 48*  CREATININE 1.51* 1.41* 1.28*  CALCIUM 7.5* 7.8* 8.1*   LFT Recent Labs    10/10/19 0542 10/10/19 1634 10/11/19 0536 10/11/19 1629 10/12/19 0431  PROT 5.6*   --  6.8  --  6.4*  ALBUMIN 1.4*  1.4*   < > 1.7* 1.6* 1.6*  AST 128*  --  143*  --  104*  ALT 54*  --  62*  --  51*  ALKPHOS 99  --  89  --  73  BILITOT 24.4*  --  29.6*  --  29.6*  BILIDIR 16.0*  --  18.7*  --  21.4*  IBILI 8.4*  --  10.9*  --  8.2*   < > = values in this interval not displayed.   PT/INR Recent Labs    10/10/19 0542 10/11/19 0536  LABPROT 21.5* 20.0*  INR 1.9* 1.7*   Hepatitis Panel No results for input(s): HEPBSAG, HCVAB, HEPAIGM, HEPBIGM in the last 72 hours.  Studies/Results: CT ABDOMEN PELVIS W CONTRAST  Addendum Date: 10/10/2019   ADDENDUM REPORT: 10/10/2019 12:47 ADDENDUM: Findings called to Dr. Jennet Maduro. Electronically Signed   By: Dorise Bullion III M.D   On: 10/10/2019 12:47   Result Date: 10/10/2019 CLINICAL DATA:  Liver disease. Chronic portal hypertension assessment. Evaluate for fluid collection. EXAM: CT ABDOMEN AND PELVIS WITH CONTRAST TECHNIQUE: Multidetector CT imaging of the abdomen and pelvis was performed using the standard protocol following bolus administration of intravenous contrast. CONTRAST:  110mL OMNIPAQUE IOHEXOL 300  MG/ML  SOLN COMPARISON:  October 05, 2019 FINDINGS: Lower chest: No acute abnormality. Hepatobiliary: Severe hepatic steatosis is identified. The liver does not demonstrate a definitive nodular contour. Hepatomegaly is identified. The liver measures 27 cm in cranial caudal dimension. Focal fatty sparing is seen adjacent to the gallbladder. The gallbladder wall is prominent. A small amount of pericholecystic fluid is not excluded. The gallbladder is otherwise unremarkable. The portal vein is patent. Pancreas: Unremarkable. No pancreatic ductal dilatation or surrounding inflammatory changes. Spleen: The spleen measures 14 cm in cranial caudal dimension, prominent. Adrenals/Urinary Tract: There is a small probable cyst in the right kidney, too small to characterize. No other renal masses identified. No hydronephrosis or  perinephric stranding. The ureters are normal in caliber with no stones. The bladder is decompressed with a Foley catheter. Air in the bladder is consistent with a Foley catheter. Stomach/Bowel: There is a feeding tube in the stomach which terminates in the distal third portion of the duodenum. The stomach is normal in appearance. There is no evidence of small-bowel obstruction. The distal small bowel is thick walled and mildly prominent caliber measuring 2.9 cm in diameter. The colon is grossly abnormal with pneumatosis throughout the entire length of the colon from cecum through sigmoid colon. The patient is status post appendectomy. Vascular/Lymphatic: The abdominal aorta is normal in caliber with no atherosclerosis. No adenopathy. Reproductive: Uterus and bilateral adnexa are unremarkable. Other: There is ascites in the abdomen, particularly in the pelvis and extending down the pericolic gutters. There is air in the pericolonic fat, likely within veins given its distribution, particularly in the left side of the abdomen, likely secondary to the pneumatosis. No free air is identified. Musculoskeletal: No acute or significant osseous findings. IMPRESSION: 1. Pneumatosis throughout the entire length of the colon. There is gas in the intraperitoneal fat adjacent to the left side of the colon, likely venous. This gas does not extend into the SMV or portal vein. The etiology of the pneumatosis and venous gas is unclear. This could represent sequela of severe enterocolitis. Vascular etiologies are considered less likely given the patient's young age and lack of obvious vascular disease. Recommend clinical correlation. 2. Prominent thick walled loops of small bowel in the right lower quadrant are likely secondary to the same process affecting the colon. 3. Gallbladder wall thickening. There is a possible small amount of pericholecystic fluid. Given the history of liver disease and the ascites, these findings are  nonspecific. If there is concern for acute cholecystitis, a right upper quadrant ultrasound would be more sensitive and specific. 4. Ascites. Hepatomegaly. The spleen is borderline to mildly enlarged as well. 5. Severe hepatic steatosis. 6. Gallbladder wall thickening. There is a possible small amount of pericholecystic fluid. Given the history of liver disease and the ascites, these findings are nonspecific. If there is concern for acute cholecystitis, a right upper quadrant ultrasound would be more sensitive and specific. 7. No other abnormalities. Findings will be called to the referring physician. Electronically Signed: By: Dorise Bullion III M.D On: 10/10/2019 12:39   US Abdomen Limited  Result Date: 10/11/2019 CLINICAL DATA:  History of hepatitis C, now with concern for portal venous hypertension and ascites. Please perform ascites search ultrasound ultrasound-guided paracentesis as indicated. EXAM: LIMITED ABDOMEN ULTRASOUND FOR ASCITES TECHNIQUE: Limited ultrasound survey for ascites was performed in all four abdominal quadrants. COMPARISON:  CT abdomen pelvis-10/10/2019 FINDINGS: Sonographic evaluation of the abdomen demonstrates a trace amount fluid within the left lower abdominal quadrant, too small  to allow for safe ultrasound-guided paracentesis. No paracentesis attempted. IMPRESSION: Trace amount of intra-abdominal ascites, too small to allow for safe ultrasound-guided paracentesis. No paracentesis attempted. Electronically Signed   By: Sandi Mariscal M.D.   On: 10/11/2019 10:58   Scheduled Meds: . chlorhexidine  15 mL Mouth Rinse BID  . Chlorhexidine Gluconate Cloth  6 each Topical Daily  . folic acid  1 mg Intravenous Daily  . insulin aspart  0-15 Units Subcutaneous Q4H  . lactulose  30 g Per Tube TID  . levothyroxine  50 mcg Intravenous Q0600  . mouth rinse  15 mL Mouth Rinse q12n4p  . pantoprazole (PROTONIX) IV  40 mg Intravenous Q12H  . prednisoLONE  40 mg Per Tube QAC breakfast  .  rifaximin  550 mg Per Tube BID  . sodium chloride flush  10-40 mL Intracatheter Q12H  . thiamine  100 mg Intravenous Daily   Continuous Infusions: . sodium chloride Stopped (10/12/19 0516)  . dexmedetomidine (PRECEDEX) IV infusion    . dextrose 5 % and 0.2 % NaCl 75 mL/hr at 10/12/19 0812  . dextrose    . levETIRAcetam Stopped (10/11/19 2137)  . piperacillin-tazobactam (ZOSYN)  IV 12.5 mL/hr at 10/12/19 0812  . potassium chloride 10 mEq (10/12/19 0812)   PRN Meds:.sodium chloride, lidocaine, LORazepam, sodium chloride flush   ASSESMENT:   *   Acute hepatic failure, alcoholic hepatitis.  Discriminant function score 124.  Hep C positive, HCV RNA not detected. CMV IgM positive.  ANA positive, IgG elevated but other AIH markers negative.  Fatty liver/hepatomegaly per imaging.  Several previous admissions for acute alcoholic hepatitis. Prednisolone day 6.  LFTs continue to improve with the exception of T bili. Clinically imporved but still signif ill.   Lille score tomorrow   *    AKI.  Dr. Jonnie Finner attributing this to hypotension, shock.  BUN/creatinine improved.  *   Diarrhea.  CT showing diffuse colonic wall thickening/colitis, pneumatosis.  Thickened loops of small bowel, likely secondary to same process affecting colon.  C. difficile negative.  Stool pathogen panel pndg.  Dr Georgette Dover recommends Zosyn (day 8) , holding tube feeds, ok for ice chips.  As she is not in shock and there is no peritonitis he does not see any indication for OR currently.  "Mortality is significant if it comes to operation likely should be palliative."  *   Coagulopathy.  Received IV vitamin K last week, INR improving.  *    Pelvic ascites.  S/p  Diagnostic paracentesis, 400 nucleated cells, 37% neutrophils: not meeting criteria for SBP.    *    Acute on chronic thrombocytopenia.  *    HE  *   Hypernatremia.  Renal added hypertonic saline despite which sodium continues to rise.  *     Hypokalemia.  *     Macrocytic anemia.   PLAN   *   Supportive care.  Ok to give essential oral meds VT.  Lille score tmrw.      Azucena Freed  10/12/2019, 8:31 AM Phone [phone]

## 2019-10-12 NOTE — Progress Notes (Signed)
[facility] Progress Note    Assessment/ Plan:   1. AKI - due to hypotension/ shock, which was short-lived and has resolved.  Creat down this afternoon, making urine, already recovering.  Treatment of hypernatremia as below. 2. Hypernatremia - w/ cirrhosis physiology and intravasc vol depletion.  Will be more aggressive with hypotonic fluids, expect a lot of third spacing with albumin 1.6, will also add albumin today. 3. Acute/ chronic liver disease - etoh, hep C, per GI 4. Colitis - acute onset, per CT, f/b GI and gen surg 5. Hepatic enceph - getting lactulose, etc 6. Schizophrenia 7. H/o seizure d/o 8. HypoK: repleted 9. Dispo: remains in ICU, prognosis poor  Subjective:    Na up to 155, K down to 2.7.  1.6L UOP.  Still has diarrhea, reports thirst.     Objective:   BP 130/89   Pulse (!) 111   Temp 99 F (37.2 C)   Resp 16   Ht 5\' 3"  (1.6 m)   Wt 89 kg   SpO2 97%   BMI 34.76 kg/m   Intake/Output Summary (Last 24 hours) at 10/12/2019 1118 Last data filed at 10/12/2019 1100 Gross per 24 hour  Intake 1931.11 ml  Output 2910 ml  Net -978.89 ml   Weight change:   Physical Exam: Gen: NAD, not making a lot of sense this AM HEENT: dry MM CVS: RRR no m/r/g Resp: clear  Abd: mildly distended, hypoactive BS Ext: no LE edema Neuro: some shakiness/ no frank asterxis  Imaging: CT ABDOMEN PELVIS W CONTRAST  Addendum Date: 10/10/2019   ADDENDUM REPORT: 10/10/2019 12:47 ADDENDUM: Findings called to Dr. Jennet Maduro. Electronically Signed   By: Dorise Bullion III M.D   On: 10/10/2019 12:47   Result Date: 10/10/2019 CLINICAL DATA:  Liver disease. Chronic portal hypertension assessment. Evaluate for fluid collection. EXAM: CT ABDOMEN AND PELVIS WITH CONTRAST TECHNIQUE: Multidetector CT imaging of the abdomen and pelvis was performed using the standard protocol following bolus administration of intravenous contrast. CONTRAST:  126mL OMNIPAQUE IOHEXOL 300 MG/ML  SOLN  COMPARISON:  October 05, 2019 FINDINGS: Lower chest: No acute abnormality. Hepatobiliary: Severe hepatic steatosis is identified. The liver does not demonstrate a definitive nodular contour. Hepatomegaly is identified. The liver measures 27 cm in cranial caudal dimension. Focal fatty sparing is seen adjacent to the gallbladder. The gallbladder wall is prominent. A small amount of pericholecystic fluid is not excluded. The gallbladder is otherwise unremarkable. The portal vein is patent. Pancreas: Unremarkable. No pancreatic ductal dilatation or surrounding inflammatory changes. Spleen: The spleen measures 14 cm in cranial caudal dimension, prominent. Adrenals/Urinary Tract: There is a small probable cyst in the right kidney, too small to characterize. No other renal masses identified. No hydronephrosis or perinephric stranding. The ureters are normal in caliber with no stones. The bladder is decompressed with a Foley catheter. Air in the bladder is consistent with a Foley catheter. Stomach/Bowel: There is a feeding tube in the stomach which terminates in the distal third portion of the duodenum. The stomach is normal in appearance. There is no evidence of small-bowel obstruction. The distal small bowel is thick walled and mildly prominent caliber measuring 2.9 cm in diameter. The colon is grossly abnormal with pneumatosis throughout the entire length of the colon from cecum through sigmoid colon. The patient is status post appendectomy. Vascular/Lymphatic: The abdominal aorta is normal in caliber with no atherosclerosis. No adenopathy. Reproductive: Uterus and bilateral adnexa are unremarkable. Other: There is ascites in the  abdomen, particularly in the pelvis and extending down the pericolic gutters. There is air in the pericolonic fat, likely within veins given its distribution, particularly in the left side of the abdomen, likely secondary to the pneumatosis. No free air is identified. Musculoskeletal: No acute  or significant osseous findings. IMPRESSION: 1. Pneumatosis throughout the entire length of the colon. There is gas in the intraperitoneal fat adjacent to the left side of the colon, likely venous. This gas does not extend into the SMV or portal vein. The etiology of the pneumatosis and venous gas is unclear. This could represent sequela of severe enterocolitis. Vascular etiologies are considered less likely given the patient's young age and lack of obvious vascular disease. Recommend clinical correlation. 2. Prominent thick walled loops of small bowel in the right lower quadrant are likely secondary to the same process affecting the colon. 3. Gallbladder wall thickening. There is a possible small amount of pericholecystic fluid. Given the history of liver disease and the ascites, these findings are nonspecific. If there is concern for acute cholecystitis, a right upper quadrant ultrasound would be more sensitive and specific. 4. Ascites. Hepatomegaly. The spleen is borderline to mildly enlarged as well. 5. Severe hepatic steatosis. 6. Gallbladder wall thickening. There is a possible small amount of pericholecystic fluid. Given the history of liver disease and the ascites, these findings are nonspecific. If there is concern for acute cholecystitis, a right upper quadrant ultrasound would be more sensitive and specific. 7. No other abnormalities. Findings will be called to the referring physician. Electronically Signed: By: Dorise Bullion III M.D On: 10/10/2019 12:39   US Abdomen Limited  Result Date: 10/11/2019 CLINICAL DATA:  History of hepatitis C, now with concern for portal venous hypertension and ascites. Please perform ascites search ultrasound ultrasound-guided paracentesis as indicated. EXAM: LIMITED ABDOMEN ULTRASOUND FOR ASCITES TECHNIQUE: Limited ultrasound survey for ascites was performed in all four abdominal quadrants. COMPARISON:  CT abdomen pelvis-10/10/2019 FINDINGS: Sonographic evaluation of  the abdomen demonstrates a trace amount fluid within the left lower abdominal quadrant, too small to allow for safe ultrasound-guided paracentesis. No paracentesis attempted. IMPRESSION: Trace amount of intra-abdominal ascites, too small to allow for safe ultrasound-guided paracentesis. No paracentesis attempted. Electronically Signed   By: Sandi Mariscal M.D.   On: 10/11/2019 10:58    Labs: BMET Recent Labs  Lab 10/09/19 1013 10/09/19 1753 10/10/19 0542 10/10/19 1634 10/11/19 0536 10/11/19 1629 10/12/19 0431  NA 139 141 142 145 149* 153* 155*  K 3.1* 3.3* 3.1* 3.8 4.0 3.2* 2.7*  CL 105 108 106 110 112* 120* 121*  CO2 25 24 24 23 23 23 23   GLUCOSE 186* 230* 170* 128* 90 149* 120*  BUN 13 15 25* 39* 50* 52* 48*  CREATININE 0.51 0.52 0.77 1.46* 1.51* 1.41* 1.28*  CALCIUM 7.0* 6.8* 6.7* 7.2* 7.5* 7.8* 8.1*  PHOS 2.3* 4.3 3.5 3.3 5.0* 4.7* 4.5   CBC Recent Labs  Lab 10/06/19 0020 10/06/19 0851 10/09/19 0600 10/10/19 0542 10/11/19 0536 10/12/19 0431  WBC 4.8   < > 7.7 7.3 8.4 7.9  NEUTROABS 3.8  --   --   --   --   --   HGB 5.9*   < > 7.8* 7.2* 8.2* 7.4*  HCT 21.0*  27.6*   < > 25.7* 24.9* 28.1* 25.5*  MCV 99.1   < > 93.8 95.8 95.9 95.5  PLT 17*   < > 64* 69* 125* 126*   < > = values in this interval  not displayed.    Medications:    . chlorhexidine  15 mL Mouth Rinse BID  . Chlorhexidine Gluconate Cloth  6 each Topical Daily  . folic acid  1 mg Intravenous Daily  . insulin aspart  0-15 Units Subcutaneous Q4H  . lactulose  30 g Per Tube TID  . levothyroxine  50 mcg Intravenous Q0600  . LORazepam  1-2 mg Intravenous Q4H  . mouth rinse  15 mL Mouth Rinse q12n4p  . pantoprazole (PROTONIX) IV  40 mg Intravenous Q24H  . potassium chloride  40 mEq Per Tube Once  . prednisoLONE  40 mg Per Tube QAC breakfast  . QUEtiapine  100 mg Per Tube TID  . rifaximin  550 mg Per Tube BID  . sodium chloride flush  10-40 mL Intracatheter Q12H  . thiamine  100 mg Intravenous Daily       Madelon Lips, MD 10/12/2019, 11:18 AM

## 2019-10-13 DIAGNOSIS — K7030 Alcoholic cirrhosis of liver without ascites: Secondary | ICD-10-CM

## 2019-10-13 DIAGNOSIS — Z7189 Other specified counseling: Secondary | ICD-10-CM

## 2019-10-13 DIAGNOSIS — K6389 Other specified diseases of intestine: Secondary | ICD-10-CM

## 2019-10-13 DIAGNOSIS — Z515 Encounter for palliative care: Secondary | ICD-10-CM

## 2019-10-13 LAB — MAGNESIUM: Magnesium: 2.5 mg/dL — ABNORMAL HIGH (ref 1.7–2.4)

## 2019-10-13 LAB — COMPREHENSIVE METABOLIC PANEL
ALT: 40 U/L (ref 0–44)
AST: 90 U/L — ABNORMAL HIGH (ref 15–41)
Albumin: 1.7 g/dL — ABNORMAL LOW (ref 3.5–5.0)
Alkaline Phosphatase: 53 U/L (ref 38–126)
Anion gap: 9 (ref 5–15)
BUN: 41 mg/dL — ABNORMAL HIGH (ref 6–20)
CO2: 22 mmol/L (ref 22–32)
Calcium: 8.1 mg/dL — ABNORMAL LOW (ref 8.9–10.3)
Chloride: 120 mmol/L — ABNORMAL HIGH (ref 98–111)
Creatinine, Ser: 0.96 mg/dL (ref 0.44–1.00)
GFR calc Af Amer: >60 mL/min (ref >60)
GFR calc non Af Amer: >60 mL/min (ref >60)
Glucose, Bld: 107 mg/dL — ABNORMAL HIGH (ref 70–99)
Potassium: 3 mmol/L — ABNORMAL LOW (ref 3.5–5.1)
Sodium: 151 mmol/L — ABNORMAL HIGH (ref 135–145)
Total Bilirubin: 29.2 mg/dL (ref 0.3–1.2)
Total Protein: 5.8 g/dL — ABNORMAL LOW (ref 6.5–8.1)

## 2019-10-13 LAB — GLUCOSE, CAPILLARY
Glucose-Capillary: 96 mg/dL (ref 70–99)
Glucose-Capillary: 86 mg/dL (ref 70–99)
Glucose-Capillary: 137 mg/dL — ABNORMAL HIGH (ref 70–99)
Glucose-Capillary: 124 mg/dL — ABNORMAL HIGH (ref 70–99)
Glucose-Capillary: 99 mg/dL (ref 70–99)

## 2019-10-13 LAB — LEVETIRACETAM LEVEL: Levetiracetam Lvl: 12.6 ug/mL (ref 10.0–40.0)

## 2019-10-13 LAB — BILIRUBIN, DIRECT: Bilirubin, Direct: 16.9 mg/dL — ABNORMAL HIGH (ref 0.0–0.2)

## 2019-10-13 LAB — AMMONIA: Ammonia: 26 umol/L (ref 9–35)

## 2019-10-13 LAB — PROTIME-INR
INR: 1.7 — ABNORMAL HIGH (ref 0.8–1.2)
Prothrombin Time: 19.7 seconds — ABNORMAL HIGH (ref 11.4–15.2)

## 2019-10-13 MED ORDER — DEXTROSE 5 % IV SOLN: INTRAVENOUS | Status: AC

## 2019-10-13 MED ORDER — POTASSIUM CHLORIDE 10 MEQ/100ML IV SOLN
10.0000 meq | INTRAVENOUS | Status: AC
  Administered 2019-10-13 (×4): 10 meq via INTRAVENOUS
  Filled 2019-10-13 (×4): qty 100

## 2019-10-13 MED ORDER — ALBUMIN HUMAN 25 % IV SOLN
25.0000 g | Freq: Once | INTRAVENOUS | Status: AC
  Administered 2019-10-13: 25 g via INTRAVENOUS
  Filled 2019-10-13: qty 50
  Filled 2019-10-13: qty 100

## 2019-10-13 MED ORDER — PANTOPRAZOLE SODIUM 40 MG PO TBEC
40.0000 mg | DELAYED_RELEASE_TABLET | Freq: Every day | ORAL | Status: DC
  Administered 2019-10-14: 40 mg via ORAL
  Filled 2019-10-13: qty 1

## 2019-10-13 MED ORDER — POTASSIUM CHLORIDE 10 MEQ/50ML IV SOLN: 10.0000 meq | INTRAVENOUS | Status: DC

## 2019-10-13 MED ORDER — LACTULOSE 10 GM/15ML PO SOLN
30.0000 g | Freq: Two times a day (BID) | ORAL | Status: DC
  Administered 2019-10-13 – 2019-10-14 (×3): 30 g
  Filled 2019-10-13 (×4): qty 45

## 2019-10-13 MED ORDER — LORAZEPAM 2 MG/ML IJ SOLN
1.0000 mg | INTRAMUSCULAR | Status: DC | PRN
  Administered 2019-10-13 – 2019-10-15 (×6): 1 mg via INTRAVENOUS
  Filled 2019-10-13 (×6): qty 1

## 2019-10-13 NOTE — Plan of Care (Signed)
  Problem: Education: Goal: Knowledge of General Education information will improve Description Including pain rating scale, medication(s)/side effects and non-pharmacologic comfort measures Outcome: Progressing   

## 2019-10-13 NOTE — Progress Notes (Signed)
Family Meeting Documentation  I had a family meeting today with Dawn Foley from palliative care, and the patient's mother Dawn Foley as well as the patient's grandmother also present.  Dawn Foley expressed that Dawn Foley has been sick for a long time and they knew that she would eventually succumb to her liver disease.  We discussed the terminal nature of chronic liver disease secondary to alcohol use and possibly hepatitis C.  We also discussed that she has superimposed enterocolitis with pneumatosis, which makes her risk for bowel perforation very high.  She also has a unacceptably high risk of mortality for any kind of surgical intervention.  Dawn Foley and her mother expressed that they did not want Dawn Foley to suffer, and they do not want her to to undergo aggressive life-sustaining measures such as invasive mechanical ventilation or CPR.  Because the patient is not insured, disposition following discharge from the hospital may be difficult.  Dawn Foley and her mother are going to think about it, but Dawn Foley will meet with them again on 2/3 to further discuss how best to take care of Dawn Foley.  The patient will be transferred to [facility] where [facility] will assume care on 2/3.   [name], MD Pulmonary and [place] Pager: [place]

## 2019-10-13 NOTE — Progress Notes (Signed)
Daily Rounding Note  10/13/2019, 11:16 AM  LOS: 8 days   SUBJECTIVE:   Chief complaint: acute liver decompensation     Dr. Georgette Dover started patient on clears this morning.  Zosyn finished yesterday but on Flagyl day 2. Patient endorses abdominal pain.  No vomiting.  OBJECTIVE:         Vital signs in last 24 hours:    Temp:  [98.6 F (37 C)-99.9 F (37.7 C)] 98.8 F (37.1 C) (02/02 1000) Pulse Rate:  [87-114] 98 (02/02 1000) Resp:  [11-18] 15 (02/02 1000) BP: (101-150)/(65-93) 101/82 (02/02 1000) SpO2:  [96 %-99 %] 97 % (02/02 1000) Weight:  [85.9 kg] 85.9 kg (02/02 0500) Last BM Date: 10/13/19 Filed Weights   10/08/19 0500 10/09/19 0429 10/13/19 0500  Weight: 88.1 kg 89 kg 85.9 kg   General: Still jaundiced.  Speech a little more clear.  Alert. Heart: RRR Chest: Clear bilaterally.  No labored breathing.  Occasional cough. Abdomen: Soft.  Tenderness diffusely, mild to moderate.  Seems worse on the right side.  Hepatomegaly.  Bowel sounds quiet.  About 800 cc of watery brown stool in Flexi-Seal bag. Extremities: No CCE. Neuro/Psych: More appropriate today.  Moves all 4.  No tremors.  Intake/Output from previous day: 02/01 0701 - 02/02 0700 In: 3835.8 [I.V.:2965.8; NG/GT:150; IV Piggyback:720] Out: 2605 [Urine:1355; Stool:1250]  Intake/Output this shift: Total I/O In: 120 [P.O.:120] Out: 175 [Urine:175]  Lab Results: Recent Labs    10/11/19 0536 10/12/19 0431  WBC 8.4 7.9  HGB 8.2* 7.4*  HCT 28.1* 25.5*  PLT 125* 126*   BMET Recent Labs    10/11/19 1629 10/12/19 0431 10/13/19 0335  NA 153* 155* 151*  K 3.2* 2.7* 3.0*  CL 120* 121* 120*  CO2 23 23 22   GLUCOSE 149* 120* 107*  BUN 52* 48* 41*  CREATININE 1.41* 1.28* 0.96  CALCIUM 7.8* 8.1* 8.1*   LFT Recent Labs    10/11/19 0536 10/11/19 0536 10/11/19 1629 10/12/19 0431 10/13/19 0335 10/13/19 0900  PROT 6.8  --   --  6.4* 5.8*  --    ALBUMIN 1.7*   < > 1.6* 1.6* 1.7*  --   AST 143*  --   --  104* 90*  --   ALT 62*  --   --  51* 40  --   ALKPHOS 89  --   --  73 53  --   BILITOT 29.6*  --   --  29.6* 29.2*  --   BILIDIR 18.7*  --   --  21.4*  --  16.9*  IBILI 10.9*  --   --  8.2*  --   --    < > = values in this interval not displayed.   PT/INR Recent Labs    10/11/19 0536 10/13/19 0335  LABPROT 20.0* 19.7*  INR 1.7* 1.7*   Hepatitis Panel No results for input(s): HEPBSAG, HCVAB, HEPAIGM, HEPBIGM in the last 72 hours.  Studies/Results: No results found.   Scheduled Meds: . chlorhexidine  15 mL Mouth Rinse BID  . Chlorhexidine Gluconate Cloth  6 each Topical Daily  . folic acid  1 mg Intravenous Daily  . insulin aspart  0-15 Units Subcutaneous Q4H  . lactulose  30 g Per Tube TID  . levothyroxine  50 mcg Intravenous Q0600  . mouth rinse  15 mL Mouth Rinse q12n4p  . pantoprazole (PROTONIX) IV  40 mg Intravenous Q24H  . prednisoLONE  40 mg Per Tube QAC breakfast  . QUEtiapine  100 mg Per Tube TID  . rifaximin  550 mg Per Tube BID  . sodium chloride flush  10-40 mL Intracatheter Q12H  . thiamine  100 mg Intravenous Daily   Continuous Infusions: . sodium chloride 10 mL/hr at 10/13/19 0600  . albumin human    . dextrose 5 % and 0.2 % NaCl 75 mL/hr at 10/13/19 0817  . dextrose    . levETIRAcetam 500 mg (10/13/19 1112)  . metronidazole Stopped (10/13/19 0419)  . phytonadione (VITAMIN K) IV     PRN Meds:.sodium chloride, lidocaine, LORazepam, sodium chloride flush   ASSESMENT:   *   Acute hepatic failure, not exclusively due to alcoholic hepatitis.  Discriminant function score 124.  Hep C positive, HCV RNA not detected. Several previous admissions for acute alcoholic hepatitis. Prednisolone day 7.  LFTs continue to improve with the exception of T bili. Clinically imporved but still signif ill.   Lille score 0.7 which represents non-response to Prednisolone.    *    AKI.  Due to hypotension, shock.   improved, making urine.    *   Diarrhea.  Lactulose in place.  CT w diffuse colonic wall thickening/colitis, pneumatosis.  Thickened loops of small bowel, likely secondary to same process affecting colon.  C. difficile negative.  Stool pathogen panel pndg.  Zosyn x 7 days stopped 2/1, Flagyl day 2 , holding tube feeds, ok for ice chips.  As she is not in shock and there is no peritonitis he does not see any indication for OR currently.  "Mortality is significant if it comes to operation likely should be palliative."  *   Coagulopathy.  Received IV vitamin K last week, INR improving.  Another dose of IV Vit K odered by Dr Jerilynn Mages for today 2/2  *   Anemia.  Normocytic.  Iron, TIBC, iron sat low.  Ferritin 161.  Folate, B12 okay.  *    Thrombocytopenia.  Improved. Acute on chronic.    *    Pelvic ascites.  1/26 diagnostic paracentesis, 400 nucleated cells, 37% neutrophils: not meeting criteria for SBP.    *    HE.  Ammonia, mental status improved on oral lactulose, Rifaximin.  MS remains significantly compromised.    *   Hypernatremia.  Improved with D5 infusion, albumin.  Renal plans to continue these measures for the next 12 hours.  *     Hypokalemia.  Persists.  *     Hypoalbuminemia, hypoproteinemia.   PLAN   *   Stop Prednisolone.  Last dose was this AM.     *    Continue lactulose but will decrease to 30 mL bid  *   I would leave the core track feeding tube in place until her mental status is consistently reliable enough for her to take p.o. without interruption.  *   Advance diet per surgery rec.  ? If it would be ok to start tube feeding? Consider getting RD nutritional consult.    *   ?  Timing of repeat CTAP??  *   Pallliative care consult pndg.  Note pt's mother would like to be involved in [place] discoussion.      Azucena Freed  10/13/2019, 11:16 AM Phone [phone]

## 2019-10-13 NOTE — Progress Notes (Signed)
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 Workup consistent with acute liver failure with multiple metabolic derangements and coagulopathies transferred to [facility] for higher level of care.    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 >> no acute intracranial abnormality; small bilateral mastoid effusions  1/ 25 CT A/P >>  1. Severe hepatic steatosis and hepatomegaly 2. Moderate to large amount of free fluid within the pelvis extending into the right abdomen which may be mildly complicated/ complex  [id number] RUQ [place] >> 1. Gallbladder sludge.  No sonographic evidence for acute cholecystitis or bilary  dilation.  2. Enlarged echogenic liver suggesting fatty inflitration  Micro Data:  1/26 BC x2 >> NGTD 1/26 UC >> polymicrobial  Antimicrobials:  1/26 zosyn >> 2/1.  2/1 flagyl>>  Interim history/subjective:  Encephalopathic. Calm. No overnight ativan. Seen by surgery, ok to have clears today. Off precedex.   Objective   Blood pressure 118/68, pulse 100, temperature 98.6 F (37 C), resp. rate 16, height 5\' 3"  (1.6 m), weight 85.9 kg, SpO2 99 %.        Intake/Output Summary (Last 24 hours) at 10/13/2019 0944 Last data filed at 10/13/2019 0600 Gross per 24 hour  Intake 3373.3 ml  Output 2475 ml  Net 898.3 ml   Filed Weights   10/08/19 0500 10/09/19 0429 10/13/19 0500  Weight: 88.1 kg 89 kg 85.9 kg    Examination: General:  Acutely ill appearing female, jaundiced, NAD HEENT: [place]/AT, PERRL, EOM-I and MMM, icteric Neuro: Lethargic but easily arousable and moving all ext to commands CV: RRR, Nl S1/S2 and -M/R/G PULM:  Diminished diffusely but clear from what is audible GI: protuberant, prominent hepatomegaly, soft, no rebound tenderness Extremities: Generalized edema Skin: Jaundiced  I reviewed abdominal CT myself, pneumatosis noted diffusely  Discussed with bedside RN  Assessment & Plan:   Acute on Chronic Liver Failure   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 Lille score is high. Consider discontinuation will discuss with GI.  Daily Meld Labs  Acute Encephalopathy- hepatic and metabolic  High risk for DTs, unclear last ETOH use (ETOH  neg 1/25) Hx alcohol related seizures on keppra.  Currently high CIWA scores P:  Continue lactulose/rifaxamin for hepatic encephalopathy down NG tube. Has  flexiseal for adequate stool output.  Precedex for etoh withdrawal but minimize dosing given encephalopathy as able. Seizure precautions Keppra BID IV  Daily thiamine/ folate IV CIWA protocol with ativan - will stretch out to q4 hours.   Pneumatosis with diffuse severe enterocolitis - received 7 days piperacillin-tazobactam - continue flagyl. Advance to clears. Will need repeat CT A/P this week.   AKI - renal protective measures - replace electrolytes as needed - at risk for HRS  Severe Anemia, Thrombocytopenia, Coagulopathy of liver disease- severe thrombocytopenia, anemia, INR 4 likely related to liver failure.  P:  Hgb stable confirming chronic losses, transfuse for Hgb >7 CBC in AM Daily PPI Would not give Vitamin K unless INR>2 or active bleeding given pro-thrombotic state in cirrhosis.   High risk for airway compromise related to encephalopathy  P:  Monitor  Supplemental O2 for sat of 88-92% Monitor closely for airway protection concerns  Electrolyte Abnormalities Hyponatremia Hypokalemia Hypomagnesemia Hypophosphatemia P:  BID RFP- slowly corrected and has resolved Continue foley/ strict I/Os Electrolyte replacement PRN  Severe malnutrition, Hypoglycemia likely related to liver dysfunction and alcohol use disorder.  At risk for refeeding syndrome P:  Serial cbg's SSI Clears today.   Hx depression, polysubstance abuse, schizophrenia P:  Currently holding home haldol, seroquel, trazadone  - would resume seroquel at least today to help encephalopathy  Hypothyroidism: TSH elevated to 12 P:  Continue synthroid, dosage adequate given normalization of free T4  The patient is critically ill with multiple organ systems failure and requires high complexity decision making for assessment and support, frequent evaluation and titration of therapies, application of advanced monitoring technologies and extensive interpretation of multiple databases.   Critical Care  Time devoted to patient care services described in this note is 41 minutes. This time reflects time of care of this [place] . This critical care time does not reflect separately billable procedures or procedure time, teaching time or supervisory time of PA/NP/Med student/Med Resident etc but could involve care discussion time.  [facility] 10/13/2019 9:44 AM  Pager: [phone] After hours pager: [phone]   Best practice:  Diet: Tube feeds Pain/Anxiety/Delirium protocol (if indicated): not needing prn ativan.  VAP protocol (if indicated): n/a DVT prophylaxis: SCD GI prophylaxis: daily PPI.  Glucose control: cbg monitoring Mobility: BR Code Status: Full  Family Communication: Family meeting with mother and palliative care today.  Disposition: can transfer to [facility]   Labs   CBC: Recent Labs  Lab 10/08/19 1629 10/09/19 0600 10/10/19 0542 10/11/19 0536 10/12/19 0431  WBC 6.9 7.7 7.3 8.4 7.9  HGB 8.5* 7.8* 7.2* 8.2* 7.4*  HCT 26.7* 25.7* 24.9* 28.1* 25.5*  MCV 91.1 93.8 95.8 95.9 95.5  PLT 52* 64* 69* 125* 126*    Basic Metabolic Panel: Recent Labs  Lab 10/09/19 0600 10/09/19 1013 10/10/19 0542 10/10/19 0542 10/10/19 1634 10/11/19 0536 10/11/19 1629 10/12/19 0431 10/13/19 0335  NA  --    < > 142   < > 145 149* 153* 155* 151*  K  --    < > 3.1*   < > 3.8 4.0 3.2* 2.7* 3.0*  CL  --    < > 106   < > 110 112* 120* 121* 120*  CO2  --    < > 24   < >  23 23 23 23 22   GLUCOSE  --    < > 170*   < > 128* 90 149* 120* 107*  BUN  --    < > 25*   < > 39* 50* 52* 48* 41*  CREATININE  --    < > 0.77   < > 1.46* 1.51* 1.41* 1.28* 0.96  CALCIUM  --    < > 6.7*   < > 7.2* 7.5* 7.8* 8.1* 8.1*  MG 2.0  --  2.1  --   --  2.7*  --  2.7* 2.5*  PHOS  --    < > 3.5  --  3.3 5.0* 4.7* 4.5  --    < > = values in this interval not displayed.   GFR: Estimated Creatinine Clearance: 87.4 mL/min (by C-G formula based on SCr of  0.96 mg/dL). Recent Labs  Lab 10/07/19 0437 10/07/19 1500 10/08/19 0134 10/08/19 1629 10/09/19 0600 10/09/19 1013 10/10/19 0542 10/11/19 0536 10/12/19 0431  PROCALCITON 1.64  --  1.08  --   --  1.02  --   --   --   WBC  --    < > 5.8   < > 7.7  --  7.3 8.4 7.9   < > = values in this interval not displayed.    Liver Function Tests: Recent Labs  Lab 10/09/19 0600 10/09/19 1013 10/10/19 0542 10/10/19 0542 10/10/19 1634 10/11/19 0536 10/11/19 1629 10/12/19 0431 10/13/19 0335  AST 185*  --  128*  --   --  143*  --  104* 90*  ALT 64*  --  54*  --   --  62*  --  51* 40  ALKPHOS 123  --  99  --   --  89  --  73 53  BILITOT 25.2*  --  24.4*  --   --  29.6*  --  29.6* 29.2*  PROT 5.7*  --  5.6*  --   --  6.8  --  6.4* 5.8*  ALBUMIN 1.5*   < > 1.4*  1.4*   < > 1.5* 1.7* 1.6* 1.6* 1.7*   < > = values in this interval not displayed.   No results for input(s): LIPASE, AMYLASE in the last 168 hours. Recent Labs  Lab 10/09/19 0600 10/10/19 0542 10/11/19 0536 10/12/19 0431 10/13/19 0335  AMMONIA 91* 87* 58* 30 26    ABG    Component Value Date/Time   PHART 7.549 (H) 10/08/2019 0817   PCO2ART 30.7 (L) 10/08/2019 0817   PO2ART 77.0 (L) 10/08/2019 0817   HCO3 26.8 10/08/2019 0817   TCO2 28 10/08/2019 0817   O2SAT 97.0 10/08/2019 0817     Coagulation Profile: Recent Labs  Lab 10/08/19 0134 10/09/19 0600 10/10/19 0542 10/11/19 0536 10/13/19 0335  INR 2.2* 1.9* 1.9* 1.7* 1.7*    Cardiac Enzymes: Recent Labs  Lab 10/12/19 1829  CKTOTAL 15*    HbA1C: Hgb A1c MFr Bld  Date/Time Value Ref Range Status  08/28/2018 08:54 AM 4.9 4.8 - 5.6 % Final    Comment:    (NOTE) Pre diabetes:          5.7%-6.4% Diabetes:              >6.4% Glycemic control for   <7.0% adults with diabetes   11/06/2017 06:38 AM 4.7 (L) 4.8 - 5.6 % Final    Comment:    (NOTE) Pre diabetes:  5.7%-6.4% Diabetes:              >6.4% Glycemic control for   <7.0% adults with  diabetes     CBG: Recent Labs  Lab 10/12/19 1501 10/12/19 1945 10/12/19 2334 10/13/19 0333 10/13/19 0749  GLUCAP 159* 123* 110* 96 86

## 2019-10-13 NOTE — Progress Notes (Signed)
Subjective/Chief Complaint: Patient awake, interactive Speech seems disoriented - asking for a drink and a cigarette; otherwise speech seems nonsensical   Objective: Vital signs in last 24 hours: Temp:  [98.4 F (36.9 C)-99.9 F (37.7 C)] 98.6 F (37 C) (02/02 0600) Pulse Rate:  [87-114] 94 (02/02 0600) Resp:  [11-19] 18 (02/02 0600) BP: (120-150)/(65-93) 127/86 (02/02 0600) SpO2:  [94 %-99 %] 97 % (02/02 0600) Weight:  [85.9 kg] 85.9 kg (02/02 0500) Last BM Date: 10/13/19  Intake/Output from previous day: 02/01 0701 - 02/02 0700 In: 3835.8 [I.V.:2965.8; NG/GT:150; IV Piggyback:720] Out: 2605 [Urine:1355; Stool:1250] Intake/Output this shift: No intake/output data recorded.  General appearance: cooperative, no distress, moderately obese and slowed mentation GI: soft, minimal distention; mild diffuse tenderness but no peritoneal signs Skin - obvious jaundice  Lab Results:  Recent Labs    10/11/19 0536 10/12/19 0431  WBC 8.4 7.9  HGB 8.2* 7.4*  HCT 28.1* 25.5*  PLT 125* 126*   BMET Recent Labs    10/12/19 0431 10/13/19 0335  NA 155* 151*  K 2.7* 3.0*  CL 121* 120*  CO2 23 22  GLUCOSE 120* 107*  BUN 48* 41*  CREATININE 1.28* 0.96  CALCIUM 8.1* 8.1*   PT/INR Recent Labs    10/11/19 0536 10/13/19 0335  LABPROT 20.0* 19.7*  INR 1.7* 1.7*   ABG No results for input(s): PHART, HCO3 in the last 72 hours.  Invalid input(s): PCO2, PO2  Studies/Results: US Abdomen Limited  Result Date: 10/11/2019 CLINICAL DATA:  History of hepatitis C, now with concern for portal venous hypertension and ascites. Please perform ascites search ultrasound ultrasound-guided paracentesis as indicated. EXAM: LIMITED ABDOMEN ULTRASOUND FOR ASCITES TECHNIQUE: Limited ultrasound survey for ascites was performed in all four abdominal quadrants. COMPARISON:  CT abdomen pelvis-10/10/2019 FINDINGS: Sonographic evaluation of the abdomen demonstrates a trace amount fluid within the  left lower abdominal quadrant, too small to allow for safe ultrasound-guided paracentesis. No paracentesis attempted. IMPRESSION: Trace amount of intra-abdominal ascites, too small to allow for safe ultrasound-guided paracentesis. No paracentesis attempted. Electronically Signed   By: Sandi Mariscal M.D.   On: 10/11/2019 10:58    Anti-infectives: Anti-infectives (From admission, onward)   Start     Dose/Rate Route Frequency Ordered Stop   10/12/19 1100  metroNIDAZOLE (FLAGYL) IVPB 500 mg     500 mg 100 mL/hr over 60 Minutes Intravenous Every 8 hours 10/12/19 1014     10/06/19 2200  rifaximin (XIFAXAN) tablet 550 mg  Status:  Discontinued     550 mg Per Tube 2 times daily 10/06/19 0935 10/06/19 1014   10/06/19 1300  piperacillin-tazobactam (ZOSYN) IVPB 3.375 g  Status:  Discontinued     3.375 g 12.5 mL/hr over 240 Minutes Intravenous Every 8 hours 10/06/19 0121 10/12/19 1014   10/06/19 1015  rifaximin (XIFAXAN) tablet 550 mg     550 mg Per Tube 2 times daily 10/06/19 1014     10/06/19 0200  piperacillin-tazobactam (ZOSYN) IVPB 3.375 g  Status:  Discontinued     3.375 g 12.5 mL/hr over 240 Minutes Intravenous Every 8 hours 10/06/19 0120 10/06/19 0121   10/06/19 0130  piperacillin-tazobactam (ZOSYN) IVPB 3.375 g     3.375 g 100 mL/hr over 30 Minutes Intravenous STAT 10/06/19 0121 10/06/19 0249      Assessment/Plan: Acute on chronic liver failure, severeChilds C cirrhosis - per GI Diffuse colonic enterocolitis with pneumatosis-not in shock and no peritonitis. Recommend:Agree with stopping Zosyn after course of treatment/ nl WBC.  Continue to follow.  She does not have any indicators of needing to go to OR at this point if evaluated using PIPES criteria. Her mortality is significant and if comes to operation likely should be palliative. Cannot fix her liver failure with surgery.   May start clear liquids Will probably need a follow-up CT scan later this week to follow pneumatosis  LOS: 8  days    Maia Petties 10/13/2019

## 2019-10-13 NOTE — Progress Notes (Signed)
Report called to Remo Lipps, RN on 6N. Will transfer patient after family is done meeting with CCM and Palliative care.

## 2019-10-13 NOTE — Progress Notes (Signed)
[place] Progress Note Patient Name: Dawn Foley DOB: [birth year] MRN: [id number]   Date of Service  10/13/2019  HPI/Events of Note  K+ = 3.0 and Creatinine = 0.96.  eICU Interventions  Will replace K+.       Intervention Category Major Interventions: Electrolyte abnormality - evaluation and management  Ivie Maese [place] 10/13/2019, 5:41 AM

## 2019-10-13 NOTE — Consult Note (Signed)
Consultation Note Date: 10/13/2019   Patient Name: Dawn Foley  DOB: October 21, 1985  MRN: [id number]  Age / Sex: 34 y.o., female  PCP: [facility], [place] Not In Referring Physician: Spero Geralds, MD  Reason for Consultation: Establishing goals of care and Psychosocial/spiritual support  HPI/Patient Profile: 33 y.o. 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    Found by EMS to be hypoglycemic and hypoxic in the 80's on room air treated with NRB and glucagon.  Unclear when her last drink was but on chart review in the fall, patient was drinking half gallon of liquor daily.   Per GIAcute on chronic liver failuresevereChildsCcirrhosis  Per surgery--Diffuse colonic enterocolitis with pneumatosis, not a surgical candidate at this time  High mortality risk  Patient's family face treatment option decisions, advanced directive decisions, and anticipatory care needs    Clinical Assessment and Goals of Care:   This NP Wadie Lessen reviewed medical records, received report from team, assessed the patient and then meet at the patient's bedside along with her mother and her  grand-mother to discuss diagnosis, prognosis, GOC, EOL wishes disposition and options.  Concept of Hospice and Palliative Care were discussed  Created space and opportunity for family to explore their thoughts and feelings regarding the current medical situation.  Patient's mother is tearful as she shares her daughter's history.  The patient has been  a heavy drinker for 12 years.  She has tried many Environmental health practitioner without success.  She has 2 daughters 70 and 16 who had been raised by their grandmother.  The relationship between family is strained, and nonexistent at most times.  The family has actually lost contact with the patient for the last several months.  The mother states "I knew this day would come"  Dr. Shearon Stalls outlined the medical situation for the family.   A detailed discussion was had regarding the difference between an aggressive medical intervention path and a palliative comfort path for this patient at this time in this situation.  We discussed the what ifs and possible scenarios depending on if the patient shows improvement or continues to decline.  Ultimately comfort is a priority for the patient.  Decisions regarding treatment will be made depending on the patient's outcome's during this hospitalization.  Family understand the seriousness of the current medical situation and high risk for decompensation and death.  Absolutely the mother wants to be called with any discussion, decisions or changes in the patient's current medical situation   Emotional support offered.   A  discussion was had today regarding advanced directives.  Concepts specific to code status, artifical feeding and hydration, continued IV antibiotics and rehospitalization was had.  The difference between  a aggressive medical intervention path  and a palliative comfort care path for this patient at this time was had.  Values and goals of care important to patient and family were attempted to be elicited.     Questions and concerns addressed.   Family encouraged to call with questions or concerns.    PMT will continue to support holistically.    No documented healthcare power of attorney.  Patient does not have medical decision-making capacity at this time.  Mother is next of kin and acting in her best interest.     SUMMARY OF RECOMMENDATIONS     Code Status/Advance Care Planning:  DNR-documented today   Palliative Prophylaxis:   Aspiration, Bowel Regimen, Delirium Protocol, Frequent Pain Assessment and Oral Care  Additional Recommendations (Limitations, Scope, Preferences):  Full Scope Treatment for the next few days to "see how she does"  Remeet with this nurse practitioner on Thursday at 10 AM to continue conversation regarding goals of care.  Psycho-social/Spiritual:   Desire for further Chaplaincy support:yes  Additional Recommendations: Education on Hospice  Prognosis:   Unable to determine  Discharge Planning: To Be Determined      Primary Diagnoses: Present on Admission: . Acute encephalopathy   I have reviewed the medical record, interviewed the patient and family, and examined the patient. The following aspects are pertinent.  Past Medical History:  Diagnosis Date  . Alcoholism ([place])   . Anemia   . Anxiety   . Arthritis   . Asthma    as a child  . Bipolar disorder ([place])   . Cancer ([place])    colon  . Chronic kidney disease   . Complication of anesthesia    woke up during colonoscopy  . Depression   . Elevated liver enzymes   . GERD (gastroesophageal reflux disease)   . H/O alcohol abuse    clean for 1 month as of 07/17/17  . Heart murmur    per [facility] per PT  . Hepatitis    denies  . Hepatitis C   . Hypothyroidism   . Mallory-Weiss tear   . Nicotine dependence   . Schizophrenia ([place])   . Seizures ([place])    seizures - most recent 05/2017, supposed to be on Tegretol but can't afford  . Thrombocytopenia ([facility])    Overview:  alcoholism  . Tibial plateau fracture, right   . Upper GI bleed    Social History   Socioeconomic History  . Marital status: Single    Spouse name: Not on file  . Number of children: Not on file  . Years of education: Not on file  . Highest education level: Not on file  Occupational History  . Not on file  Tobacco Use  . Smoking status:  Current Every Day Smoker    Packs/day: 1.00    Types: Cigarettes  . Smokeless tobacco: Never Used  Substance and Sexual Activity  . Alcohol use: Yes    Alcohol/week: 16.0 standard drinks    Types: 16 Standard drinks or equivalent per week    Comment: 1/2 gallon liquor daily  . Drug use: No  . Sexual activity: Not Currently    Birth control/protection: None  Other Topics Concern  . Not on file  Social History Narrative  . Not on file   Social Determinants of Health   Financial Resource Strain:   . Difficulty of Paying Living Expenses: Not on file  Food Insecurity:   . Worried About Charity fundraiser  in the Last Year: Not on file  . Ran Out of Food in the Last Year: Not on file  Transportation Needs:   . Lack of Transportation (Medical): Not on file  . Lack of Transportation (Non-Medical): Not on file  Physical Activity:   . Days of Exercise per Week: Not on file  . Minutes of Exercise per Session: Not on file  Stress:   . Feeling of Stress : Not on file  Social Connections:   . Frequency of Communication with Friends and Family: Not on file  . Frequency of Social Gatherings with Friends and Family: Not on file  . Attends Religious Services: Not on file  . Active Member of Clubs or Organizations: Not on file  . Attends Archivist Meetings: Not on file  . Marital Status: Not on file   Family History  Problem Relation Age of Onset  . Mental illness Other   . Thyroid disease Other   . Alcoholism Brother    Scheduled Meds: . chlorhexidine  15 mL Mouth Rinse BID  . Chlorhexidine Gluconate Cloth  6 each Topical Daily  . folic acid  1 mg Intravenous Daily  . insulin aspart  0-15 Units Subcutaneous Q4H  . lactulose  30 g Per Tube TID  . levothyroxine  50 mcg Intravenous Q0600  . mouth rinse  15 mL Mouth Rinse q12n4p  . pantoprazole (PROTONIX) IV  40 mg Intravenous Q24H  . prednisoLONE  40 mg Per Tube QAC breakfast  . QUEtiapine  100 mg Per Tube TID  .  rifaximin  550 mg Per Tube BID  . sodium chloride flush  10-40 mL Intracatheter Q12H  . thiamine  100 mg Intravenous Daily   Continuous Infusions: . sodium chloride 10 mL/hr at 10/13/19 0600  . dexmedetomidine (PRECEDEX) IV infusion    . dextrose 5 % and 0.2 % NaCl 75 mL/hr at 10/13/19 0817  . levETIRAcetam Stopped (10/12/19 2307)  . metronidazole Stopped (10/13/19 0419)  . phytonadione (VITAMIN K) IV    . potassium chloride 10 mEq (10/13/19 0911)   PRN Meds:.sodium chloride, lidocaine, LORazepam, sodium chloride flush Medications Prior to Admission:  Prior to Admission medications   Medication Sig Start Date End Date Taking? Authorizing Provider  haloperidol (HALDOL) 10 MG tablet Take 10 mg by mouth 3 (three) times daily.    [provider]  levETIRAcetam (KEPPRA) 500 MG tablet Take 1 tablet (500 mg total) by mouth 2 (two) times daily. Patient not taking: Reported on 10/06/2019 10/27/18   Johnn Hai, MD  levothyroxine (SYNTHROID, LEVOTHROID) 100 MCG tablet Take 1 tablet (100 mcg total) by mouth daily at 6 (six) AM. Patient not taking: Reported on 10/06/2019 09/02/18   Clapacs, Madie Reno, MD  QUEtiapine (SEROQUEL) 300 MG tablet Take 300 mg by mouth 3 (three) times daily.    [provider]  traZODone (DESYREL) 50 MG tablet Take 50 mg by mouth at bedtime. 10/17/18   [provider]   Allergies  Allergen Reactions  . Ondansetron Nausea And Vomiting  . Fish Allergy Nausea And Vomiting  . Grapeseed Extract [Nutritional Supplements] Hives  . Other Rash    Polyester   Review of Systems  Unable to perform ROS: Acuity of condition    Physical Exam Constitutional:      Appearance: She is normal weight. She is ill-appearing.  Cardiovascular:     Rate and Rhythm: Normal rate and regular rhythm.  Pulmonary:     Breath sounds: Decreased air  movement present.  Skin:    General: Skin is warm and dry.     Coloration: Skin is jaundiced.  Neurological:     Mental  Status: She is lethargic.  Psychiatric:        Speech: Speech is slurred.        Cognition and Memory: Cognition is impaired.     Vital Signs: BP 127/86   Pulse 94   Temp 98.6 F (37 C)   Resp 18   Ht 5\' 3"  (1.6 m)   Wt 85.9 kg   SpO2 97%   BMI 33.55 kg/m  Pain Scale: 0-10   Pain Score: Asleep   SpO2: SpO2: 97 % O2 Device:SpO2: 97 % O2 Flow Rate: .O2 Flow Rate (L/min): 2 L/min  IO: Intake/output summary:   Intake/Output Summary (Last 24 hours) at 10/13/2019 [id number] Last data filed at 10/13/2019 0600 Gross per 24 hour  Intake 3373.3 ml  Output 2475 ml  Net 898.3 ml    LBM: Last BM Date: 10/13/19 Baseline Weight: Weight: 85.2 kg Most recent weight: Weight: 85.9 kg     Palliative Assessment/Data: 30 % at best   Discussed with Dr. Shearon Stalls  Time In: 1100 Time Out: 1215 Time Total: 75 minutes Greater than 50%  of this time was spent counseling and coordinating care related to the above assessment and plan.  Signed by: Wadie Lessen, NP   Please contact Palliative Medicine Team phone at [phone] for questions and concerns.  For individual provider: See Shea Evans

## 2019-10-13 NOTE — Social Work (Signed)
CSW acknowledging consult for substance use. At this time consult not appropriate given multiple care needs. CSW spoke with PMT provider Wadie Lessen, remain available as needed.   Westley Hummer, MSW, [place] Work

## 2019-10-13 NOTE — Progress Notes (Signed)
[facility] Progress Note    Assessment/ Plan:   1. AKI - due to hypotension/ shock, which was short-lived and has resolved.  Creat down this afternoon, making urine, already recovering.  Treatment of hypernatremia as below. 2. Hypernatremia - w/ cirrhosis physiology and intravasc vol depletion. Better after D5 infusion and albumin.  Will reorder for 12 more hours.   3. Acute/ chronic liver disease - etoh, hep C, per GI 4. Colitis - acute onset, per CT, f/b GI and gen surg 5. Hepatic enceph - getting lactulose, etc 6. Schizophrenia 7. H/o seizure d/o 8. HypoK: repleted 9. Dispo: remains in ICU, prognosis poor.  We will sign off.  Please call us if needed.  Subjective:    Na down to 151, Cr down to 1.  Sleepy this AM and sort of mumbling to herself.    Objective:   BP 101/82   Pulse 98   Temp 98.8 F (37.1 C)   Resp 15   Ht 5\' 3"  (1.6 m)   Wt 85.9 kg   SpO2 97%   BMI 33.55 kg/m   Intake/Output Summary (Last 24 hours) at 10/13/2019 1107 Last data filed at 10/13/2019 1000 Gross per 24 hour  Intake 3343.3 ml  Output 2650 ml  Net 693.3 ml   Weight change:   Physical Exam: Gen: NAD, sleepy, very jaundiced HEENT: dry MM CVS: RRR no m/r/g Resp: clear  Abd: mildly distended, hypoactive BS Ext: no LE edema Neuro: some shakiness/ no frank asterxis  Imaging: No results found.  Labs: BMET Recent Labs  Lab 10/09/19 1013 10/09/19 1013 10/09/19 1753 10/10/19 0542 10/10/19 1634 10/11/19 0536 10/11/19 1629 10/12/19 0431 10/13/19 0335  NA 139   < > 141 142 145 149* 153* 155* 151*  K 3.1*   < > 3.3* 3.1* 3.8 4.0 3.2* 2.7* 3.0*  CL 105   < > 108 106 110 112* 120* 121* 120*  CO2 25   < > 24 24 23 23 23 23 22   GLUCOSE 186*   < > 230* 170* 128* 90 149* 120* 107*  BUN 13   < > 15 25* 39* 50* 52* 48* 41*  CREATININE 0.51   < > 0.52 0.77 1.46* 1.51* 1.41* 1.28* 0.96  CALCIUM 7.0*   < > 6.8* 6.7* 7.2* 7.5* 7.8* 8.1* 8.1*  PHOS 2.3*  --  4.3 3.5 3.3 5.0* 4.7*  4.5  --    < > = values in this interval not displayed.   CBC Recent Labs  Lab 10/09/19 0600 10/10/19 0542 10/11/19 0536 10/12/19 0431  WBC 7.7 7.3 8.4 7.9  HGB 7.8* 7.2* 8.2* 7.4*  HCT 25.7* 24.9* 28.1* 25.5*  MCV 93.8 95.8 95.9 95.5  PLT 64* 69* 125* 126*    Medications:    . chlorhexidine  15 mL Mouth Rinse BID  . Chlorhexidine Gluconate Cloth  6 each Topical Daily  . folic acid  1 mg Intravenous Daily  . insulin aspart  0-15 Units Subcutaneous Q4H  . lactulose  30 g Per Tube TID  . levothyroxine  50 mcg Intravenous Q0600  . mouth rinse  15 mL Mouth Rinse q12n4p  . pantoprazole (PROTONIX) IV  40 mg Intravenous Q24H  . prednisoLONE  40 mg Per Tube QAC breakfast  . QUEtiapine  100 mg Per Tube TID  . rifaximin  550 mg Per Tube BID  . sodium chloride flush  10-40 mL Intracatheter Q12H  . thiamine  100 mg Intravenous Daily  Madelon Lips, MD 10/13/2019, 11:07 AM

## 2019-10-14 ENCOUNTER — Inpatient Hospital Stay (HOSPITAL_COMMUNITY): Payer: Self-pay

## 2019-10-14 LAB — COMPREHENSIVE METABOLIC PANEL
ALT: 40 U/L (ref 0–44)
AST: 80 U/L — ABNORMAL HIGH (ref 15–41)
Albumin: 1.7 g/dL — ABNORMAL LOW (ref 3.5–5.0)
Alkaline Phosphatase: 55 U/L (ref 38–126)
Anion gap: 8 (ref 5–15)
BUN: 37 mg/dL — ABNORMAL HIGH (ref 6–20)
CO2: 21 mmol/L — ABNORMAL LOW (ref 22–32)
Calcium: 7.8 mg/dL — ABNORMAL LOW (ref 8.9–10.3)
Chloride: 117 mmol/L — ABNORMAL HIGH (ref 98–111)
Creatinine, Ser: 1.1 mg/dL — ABNORMAL HIGH (ref 0.44–1.00)
GFR calc Af Amer: >60 mL/min (ref >60)
GFR calc non Af Amer: >60 mL/min (ref >60)
Glucose, Bld: 77 mg/dL (ref 70–99)
Potassium: 3.1 mmol/L — ABNORMAL LOW (ref 3.5–5.1)
Sodium: 146 mmol/L — ABNORMAL HIGH (ref 135–145)
Total Bilirubin: 28.9 mg/dL (ref 0.3–1.2)
Total Protein: 5.4 g/dL — ABNORMAL LOW (ref 6.5–8.1)

## 2019-10-14 LAB — MAGNESIUM: Magnesium: 2.4 mg/dL (ref 1.7–2.4)

## 2019-10-14 LAB — GLUCOSE, CAPILLARY
Glucose-Capillary: 74 mg/dL (ref 70–99)
Glucose-Capillary: 75 mg/dL (ref 70–99)
Glucose-Capillary: 77 mg/dL (ref 70–99)
Glucose-Capillary: 86 mg/dL (ref 70–99)
Glucose-Capillary: 89 mg/dL (ref 70–99)
Glucose-Capillary: 92 mg/dL (ref 70–99)
Glucose-Capillary: 97 mg/dL (ref 70–99)

## 2019-10-14 LAB — PROTIME-INR
INR: 1.8 — ABNORMAL HIGH (ref 0.8–1.2)
Prothrombin Time: 20.5 seconds — ABNORMAL HIGH (ref 11.4–15.2)

## 2019-10-14 LAB — HSV(HERPES SIMPLEX VRS) I + II AB-IGM: HSVI/II Comb IgM: 2.01 Ratio — ABNORMAL HIGH (ref 0.00–0.90)

## 2019-10-14 MED ORDER — BOOST / RESOURCE BREEZE PO LIQD CUSTOM
1.0000 | Freq: Three times a day (TID) | ORAL | Status: DC
  Administered 2019-10-14 – 2019-10-15 (×3): 1 via ORAL

## 2019-10-14 MED ORDER — POTASSIUM CHLORIDE 20 MEQ/15ML (10%) PO SOLN
40.0000 meq | Freq: Two times a day (BID) | ORAL | Status: DC
  Administered 2019-10-14 (×2): 40 meq via ORAL
  Filled 2019-10-14 (×3): qty 30

## 2019-10-14 MED ORDER — ZINC SULFATE 220 (50 ZN) MG PO CAPS
220.0000 mg | ORAL_CAPSULE | Freq: Two times a day (BID) | ORAL | Status: DC
  Administered 2019-10-14 (×2): 220 mg via ORAL
  Filled 2019-10-14 (×3): qty 1

## 2019-10-14 NOTE — Progress Notes (Signed)
PROGRESS NOTE    Dawn Foley  [id number] DOB: 1986/02/13 DOA: 10/05/2019 PCP: System, Pcp Not In    Brief Narrative:  Unfortunate chronically sick lady with chronic alcohol use drinking more than half a gallon of liquor daily, alcohol withdrawal seizure on Keppra, polysubstance abuse, schizophrenia, depression and anxiety, hepatitis C, Mallory-Weiss tear and hypothyroidism who presented to [facility] emergency room on 1/25 for several day history of worsening jaundice and encephalopathy.  She was found with acute liver failure with multiple metabolic derangements and coagulopathies and was transferred to [facility] ICU for further management. 1/25-2/2: Patient remained in intensive care unit and treated for alcohol withdrawal, hepatic encephalopathy, acute alcoholic hepatitis.  Mental status remains poor.  Overall clinical status remains poor.  Transfer out of ICU on 2/3.  1/25 tx from [facility] to [facility] CT scan showed severe hepatic steatosis and hepatomegaly. 1/26 started on prednisolone, rifaxamin, had paracentesis of the left side of abdomen 70cc removed 1/28 Mental status, Ammonia level worse, bilirubin remains high while other lft's improve 1/29 Mental status and ammonia level improving, bilirubin down slightly, fever overnight, starting to withdrawal from alcohol 1/30 CT scan abdomen pelvis with extensive pneumatosis. Blood cultures negative.  C. difficile negative.  Assessment & Plan:   Active Problems:   Acute encephalopathy   Abdominal fluid collection   Abnormal transaminases   Coagulopathy (HCC)   Ascites due to alcoholic hepatitis   Palliative care by specialist   DNR (do not resuscitate) discussion  Acute on chronic fulminant liver failure with acute metabolic encephalopathy, hepatic encephalopathy. With underlying liver cirrhosis due to hepatitis C and aggravated by alcohol use disorder with superimposed acute alcoholic hepatitis. Poor prognosis. Treated  with prednisone, stopped today.  Followed by GI. Already has diarrhea, ammonia down to 26.  Bilirubin is 28. Continue lactulose/rifaximin. Treated with Precedex, discontinued. Seizure precautions. Currently on Keppra, thiamine and folic acid.  As needed Ativan.  Pneumatosis with diffuse severe enterocolitis: Conservative management.  Surgery following.  Repeat CT scan today. Received Zosyn for 7 days. Currently on Flagyl.  Will follow up CAT scan today.  Acute kidney injury: Kidney functions are stable at this time.  On maintenance fluid.  Severe anemia, thrombocytopenia: With coagulopathy. Hemoglobin has remained stable now.  On PPI.  Treated with vitamin K.  Hypokalemia: Replace aggressively.  Hypothyroidism: On Synthroid.  Discussed with GI and palliative care team. Palliative team meeting with patient's family today.   DVT prophylaxis: SCDs Code Status: DNR Family Communication: Pending palliative meeting with family. Disposition Plan: patient is from home.. Anticipated DC to unknown, Barriers to discharge active severe disease, unknown outcome.   Consultants:   Gastroenterology  Surgery  Palliative medicine  Procedures:   None  Antimicrobials:  Anti-infectives (From admission, onward)   Start     Dose/Rate Route Frequency Ordered Stop   10/12/19 1100  metroNIDAZOLE (FLAGYL) IVPB 500 mg     500 mg 100 mL/hr over 60 Minutes Intravenous Every 8 hours 10/12/19 1014     10/06/19 2200  rifaximin (XIFAXAN) tablet 550 mg  Status:  Discontinued     550 mg Per Tube 2 times daily 10/06/19 0935 10/06/19 1014   10/06/19 1300  piperacillin-tazobactam (ZOSYN) IVPB 3.375 g  Status:  Discontinued     3.375 g 12.5 mL/hr over 240 Minutes Intravenous Every 8 hours 10/06/19 0121 10/12/19 1014   10/06/19 1015  rifaximin (XIFAXAN) tablet 550 mg     550 mg Per Tube 2 times daily 10/06/19 1014  10/06/19 0200  piperacillin-tazobactam (ZOSYN) IVPB 3.375 g  Status:  Discontinued       3.375 g 12.5 mL/hr over 240 Minutes Intravenous Every 8 hours 10/06/19 0120 10/06/19 0121   10/06/19 0130  piperacillin-tazobactam (ZOSYN) IVPB 3.375 g     3.375 g 100 mL/hr over 30 Minutes Intravenous STAT 10/06/19 0121 10/06/19 0249         Subjective: Patient seen and examined.  "I want to eat, I am hungry" No other overnight events.  Patient states he is nauseated, then again, she states that she wants to eat. Afebrile overnight.  Patient is taking clear liquids.  Has some abdominal pain that is diffuse.  Objective: Vitals:   10/13/19 1300 10/13/19 1422 10/13/19 1958 10/14/19 0330  BP: 126/77 133/74 112/63 110/61  Pulse: (!) 101 88 (!) 104 (!) 101  Resp: 16 15 19 18   Temp:  98.4 F (36.9 C) 100 F (37.8 C) 98.6 F (37 C)  TempSrc:  Oral Oral Oral  SpO2: 98% 94% 100% 100%  Weight:      Height:        Intake/Output Summary (Last 24 hours) at 10/14/2019 1316 Last data filed at 10/14/2019 0600 Gross per 24 hour  Intake 0 ml  Output 600 ml  Net -600 ml   Filed Weights   10/08/19 0500 10/09/19 0429 10/13/19 0500  Weight: 88.1 kg 89 kg 85.9 kg    Examination:  Physical Exam  Constitutional:  Sick looking, deeply icteric.  Not in any distress.  HENT:  Head: Normocephalic.  Eyes: Pupils are equal, round, and reactive to light. Scleral icterus is present.  Cardiovascular: Normal rate and regular rhythm.  Respiratory: Breath sounds normal.  GI: Bowel sounds are normal.  Mildly distended, diffusely tender with no rigidity or guarding.  Musculoskeletal:        General: No edema.     Cervical back: Neck supple.  Neurological: She is alert.  Psychiatric:  Sick looking, anxious with flat affect.       Data Reviewed: I have personally reviewed following labs and imaging studies  CBC: Recent Labs  Lab 10/08/19 1629 10/09/19 0600 10/10/19 0542 10/11/19 0536 10/12/19 0431  WBC 6.9 7.7 7.3 8.4 7.9  HGB 8.5* 7.8* 7.2* 8.2* 7.4*  HCT 26.7* 25.7* 24.9* 28.1*  25.5*  MCV 91.1 93.8 95.8 95.9 95.5  PLT 52* 64* 69* 125* 123XX123*   Basic Metabolic Panel: Recent Labs  Lab 10/10/19 0542 10/10/19 0542 10/10/19 1634 10/10/19 1634 10/11/19 0536 10/11/19 1629 10/12/19 0431 10/13/19 0335 10/14/19 0350  NA 142   < > 145   < > 149* 153* 155* 151* 146*  K 3.1*   < > 3.8   < > 4.0 3.2* 2.7* 3.0* 3.1*  CL 106   < > 110   < > 112* 120* 121* 120* 117*  CO2 24   < > 23   < > 23 23 23 22  21*  GLUCOSE 170*   < > 128*   < > 90 149* 120* 107* 77  BUN 25*   < > 39*   < > 50* 52* 48* 41* 37*  CREATININE 0.77   < > 1.46*   < > 1.51* 1.41* 1.28* 0.96 1.10*  CALCIUM 6.7*   < > 7.2*   < > 7.5* 7.8* 8.1* 8.1* 7.8*  MG 2.1  --   --   --  2.7*  --  2.7* 2.5* 2.4  PHOS 3.5  --  3.3  --  5.0* 4.7* 4.5  --   --    < > = values in this interval not displayed.   GFR: Estimated Creatinine Clearance: 76.3 mL/min (A) (by C-G formula based on SCr of 1.1 mg/dL (H)). Liver Function Tests: Recent Labs  Lab 10/10/19 0542 10/10/19 1634 10/11/19 0536 10/11/19 1629 10/12/19 0431 10/13/19 0335 10/14/19 0350  AST 128*  --  143*  --  104* 90* 80*  ALT 54*  --  62*  --  51* 40 40  ALKPHOS 99  --  89  --  73 53 55  BILITOT 24.4*  --  29.6*  --  29.6* 29.2* 28.9*  PROT 5.6*  --  6.8  --  6.4* 5.8* 5.4*  ALBUMIN 1.4*  1.4*   < > 1.7* 1.6* 1.6* 1.7* 1.7*   < > = values in this interval not displayed.   No results for input(s): LIPASE, AMYLASE in the last 168 hours. Recent Labs  Lab 10/09/19 0600 10/10/19 0542 10/11/19 0536 10/12/19 0431 10/13/19 0335  AMMONIA 91* 87* 58* 30 26   Coagulation Profile: Recent Labs  Lab 10/09/19 0600 10/10/19 0542 10/11/19 0536 10/13/19 0335 10/14/19 0350  INR 1.9* 1.9* 1.7* 1.7* 1.8*   Cardiac Enzymes: Recent Labs  Lab 10/12/19 1829  CKTOTAL 15*   BNP (last 3 results) No results for input(s): PROBNP in the last 8760 hours. HbA1C: No results for input(s): HGBA1C in the last 72 hours. CBG: Recent Labs  Lab 10/13/19 2000  10/14/19 0014 10/14/19 0327 10/14/19 0727 10/14/19 1139  GLUCAP 99 77 74 89 92   Lipid Profile: No results for input(s): CHOL, HDL, LDLCALC, TRIG, CHOLHDL, LDLDIRECT in the last 72 hours. Thyroid Function Tests: No results for input(s): TSH, T4TOTAL, FREET4, T3FREE, THYROIDAB in the last 72 hours. Anemia Panel: No results for input(s): VITAMINB12, FOLATE, FERRITIN, TIBC, IRON, RETICCTPCT in the last 72 hours. Sepsis Labs: Recent Labs  Lab 10/08/19 0134 10/09/19 1013  PROCALCITON 1.08 1.02    Recent Results (from the past 240 hour(s))  MRSA PCR Screening     Status: Abnormal   Collection Time: 10/05/19 11:36 PM   Specimen: Nasal Mucosa; Nasopharyngeal  Result Value Ref Range Status   MRSA by PCR POSITIVE (A) NEGATIVE Final    Comment:        The GeneXpert MRSA Assay (FDA approved for NASAL specimens only), is one component of a comprehensive MRSA colonization surveillance program. It is not intended to diagnose MRSA infection nor to guide or monitor treatment for MRSA infections. RESULT CALLED TO, READ BACK BY AND VERIFIED WITH: Levi Aland RN 10/06/19 0106 JDW Performed at [facility], [address], [place], [place] [address]   Culture, blood (routine x 2)     Status: None   Collection Time: 10/06/19 12:25 AM   Specimen: BLOOD  Result Value Ref Range Status   Specimen Description BLOOD RIGHT ANTECUBITAL  Final   Special Requests   Final    BOTTLES DRAWN AEROBIC AND ANAEROBIC Blood Culture adequate volume   Culture   Final    NO GROWTH 5 DAYS Performed at [facility], [place] [address] [place], [place] [address]    Report Status 10/11/2019 FINAL  Final  Culture, blood (routine x 2)     Status: None   Collection Time: 10/06/19 12:45 AM   Specimen: BLOOD LEFT HAND  Result Value Ref Range Status   Specimen Description BLOOD LEFT HAND  Final   Special Requests  Final    BOTTLES DRAWN AEROBIC AND ANAEROBIC Blood Culture adequate volume   Culture    Final    NO GROWTH 5 DAYS Performed at [facility], [place] [address], [place], [place] [address]    Report Status 10/11/2019 FINAL  Final  Culture, Urine     Status: Abnormal   Collection Time: 10/06/19  1:42 AM   Specimen: Urine, Random  Result Value Ref Range Status   Specimen Description URINE, RANDOM  Final   Special Requests   Final    NONE Performed at [facility], [place] [address] [place], [place] [address]    Culture MULTIPLE SPECIES PRESENT, SUGGEST RECOLLECTION (A)  Final   Report Status 10/06/2019 FINAL  Final  Gram stain     Status: None   Collection Time: 10/06/19  3:11 PM   Specimen: Abdomen; Peritoneal Fluid  Result Value Ref Range Status   Specimen Description PERITONEAL  Final   Special Requests NONE  Final   Gram Stain   Final    FEW WBC PRESENT,BOTH PMN AND MONONUCLEAR NO ORGANISMS SEEN Performed at [facility], [place] [address], [place], [place] [address]    Report Status 10/06/2019 FINAL  Final  Culture, body fluid-bottle     Status: None   Collection Time: 10/06/19  3:11 PM   Specimen: Peritoneal Washings  Result Value Ref Range Status   Specimen Description PERITONEAL  Final   Special Requests NONE  Final   Culture   Final    NO GROWTH 5 DAYS Performed at [facility], [address], [place], [place] [address]    Report Status 10/11/2019 FINAL  Final  C difficile quick scan w PCR reflex     Status: None   Collection Time: 10/11/19 10:08 AM   Specimen: Stool  Result Value Ref Range Status   C Diff antigen NEGATIVE NEGATIVE Final   C Diff toxin NEGATIVE NEGATIVE Final   C Diff interpretation No C. difficile detected.  Final    Comment: Performed at [facility], [place] [address] [place], [place] [address]         Radiology Studies: No results found.      Scheduled Meds: . chlorhexidine  15 mL Mouth Rinse BID  . Chlorhexidine Gluconate Cloth  6 each Topical Daily  . feeding supplement  1 Container Oral TID  BM  . folic acid  1 mg Intravenous Daily  . insulin aspart  0-15 Units Subcutaneous Q4H  . lactulose  30 g Per Tube BID  . levothyroxine  50 mcg Intravenous Q0600  . mouth rinse  15 mL Mouth Rinse q12n4p  . pantoprazole  40 mg Oral Q0600  . potassium chloride  40 mEq Oral BID  . QUEtiapine  100 mg Per Tube TID  . rifaximin  550 mg Per Tube BID  . sodium chloride flush  10-40 mL Intracatheter Q12H  . thiamine  100 mg Intravenous Daily  . zinc sulfate  220 mg Oral BID   Continuous Infusions: . sodium chloride Stopped (10/13/19 0911)  . dextrose 5 % and 0.2 % NaCl 75 mL/hr at 10/14/19 1000  . levETIRAcetam 500 mg (10/14/19 1008)  . metronidazole 500 mg (10/14/19 1011)     LOS: 9 days    Time spent: 35 minutes    Barb Merino, MD [facility] Pager [phone]

## 2019-10-14 NOTE — Progress Notes (Signed)
Patient taking down to CT. Received call from CT stating patient will need an order for pregnancy test prior to receiving CT scan. Patient brought back to room. MD notified.

## 2019-10-14 NOTE — Progress Notes (Signed)
[facility] Progress Note     Subjective: CC: hungry and thirsty Patient reports she is hungry and thirsty and cannot reach her drink. She reports only mild abdominal pain. Denies nausea. She has done well with CLD. Rectal tube present and there is liquid stool there. She wants to know when she can eat.   Review of Systems  Respiratory: Negative for shortness of breath.   Cardiovascular: Negative for chest pain.  Gastrointestinal: Positive for abdominal pain (mild) and diarrhea. Negative for nausea and vomiting.       Increase in appetite    Objective: Vital signs in last 24 hours: Temp:  [98.4 F (36.9 C)-100 F (37.8 C)] 98.6 F (37 C) (02/03 0330) Pulse Rate:  [88-104] 101 (02/03 0330) Resp:  [13-19] 18 (02/03 0330) BP: (101-133)/(61-82) 110/61 (02/03 0330) SpO2:  [94 %-100 %] 100 % (02/03 0330) Last BM Date: 10/13/19  Intake/Output from previous day: 02/02 0701 - 02/03 0700 In: 1520.9 [P.O.:120; I.V.:870; IV Piggyback:531] Out: 825 [Urine:625; Stool:200] Intake/Output this shift: No intake/output data recorded.  PE: General: overweight white female who is laying in bed in NAD HEENT:  Sclera are icteric.  PERRL.  Ears and nose without any masses or lesions.  Mouth is pink and moist. Cortrak present Heart: regular, rate, and rhythm.  Normal s1,s2. No obvious murmurs, gallops, or rubs noted.  Palpable radial and pedal pulses bilaterally Lungs: CTAB, no wheezes, rhonchi, or rales noted.  Respiratory effort nonlabored Abd: soft, mild diffuse ttp, ND, +BS, rectal tube present with liquid stool in bag MS: all 4 extremities are symmetrical, mild edema in hands and feet bilaterally. Skin: warm and dry, severe jaundice Neuro: follows commands, speech clear Psych: A&Ox3 with an appropriate affect.   Lab Results:  Recent Labs    10/12/19 0431  WBC 7.9  HGB 7.4*  HCT 25.5*  PLT 126*   BMET Recent Labs    10/13/19 0335 10/14/19 0350  NA 151* 146*  K  3.0* 3.1*  CL 120* 117*  CO2 22 21*  GLUCOSE 107* 77  BUN 41* 37*  CREATININE 0.96 1.10*  CALCIUM 8.1* 7.8*   PT/INR Recent Labs    10/13/19 0335 10/14/19 0350  LABPROT 19.7* 20.5*  INR 1.7* 1.8*   CMP     Component Value Date/Time   NA 146 (H) 10/14/2019 0350   K 3.1 (L) 10/14/2019 0350   CL 117 (H) 10/14/2019 0350   CO2 21 (L) 10/14/2019 0350   GLUCOSE 77 10/14/2019 0350   BUN 37 (H) 10/14/2019 0350   CREATININE 1.10 (H) 10/14/2019 0350   CALCIUM 7.8 (L) 10/14/2019 0350   CALCIUM 9.4 07/18/2017 0900   PROT 5.4 (L) 10/14/2019 0350   ALBUMIN 1.7 (L) 10/14/2019 0350   AST 80 (H) 10/14/2019 0350   ALT 40 10/14/2019 0350   ALKPHOS 55 10/14/2019 0350   BILITOT 28.9 (HH) 10/14/2019 0350   GFRNONAA >60 10/14/2019 0350   GFRAA >60 10/14/2019 0350   Lipase     Component Value Date/Time   LIPASE 49 10/06/2019 0020       Studies/Results: No results found.  Anti-infectives: Anti-infectives (From admission, onward)   Start     Dose/Rate Route Frequency Ordered Stop   10/12/19 1100  metroNIDAZOLE (FLAGYL) IVPB 500 mg     500 mg 100 mL/hr over 60 Minutes Intravenous Every 8 hours 10/12/19 1014     10/06/19 2200  rifaximin (XIFAXAN) tablet 550 mg  Status:  Discontinued  550 mg Per Tube 2 times daily 10/06/19 0935 10/06/19 1014   10/06/19 1300  piperacillin-tazobactam (ZOSYN) IVPB 3.375 g  Status:  Discontinued     3.375 g 12.5 mL/hr over 240 Minutes Intravenous Every 8 hours 10/06/19 0121 10/12/19 1014   10/06/19 1015  rifaximin (XIFAXAN) tablet 550 mg     550 mg Per Tube 2 times daily 10/06/19 1014     10/06/19 0200  piperacillin-tazobactam (ZOSYN) IVPB 3.375 g  Status:  Discontinued     3.375 g 12.5 mL/hr over 240 Minutes Intravenous Every 8 hours 10/06/19 0120 10/06/19 0121   10/06/19 0130  piperacillin-tazobactam (ZOSYN) IVPB 3.375 g     3.375 g 100 mL/hr over 30 Minutes Intravenous STAT 10/06/19 0121 10/06/19 0249       Assessment/Plan Acute on  chronic liver failure, severeChildsCcirrhosis - per GI Diffuse colonic enterocolitis with pneumatosis - not in shock and no peritonitis.  - Agree with stopping Zosyn after course of treatment/ nl WBC. Continue to follow.  - She does not have any indicators of needing to go to OR at this point if evaluated using PIPES criteria. Her mortality is significant and if comes to operation likely should be palliative.  - she is tolerating CLD and have bowel function - will repeat CT A/P today and if pneumatosis coli is improved then can likely advance diet  FEN: CLD VTE: SCDs, INR 1.8 ID: flagyl 2/1>>  LOS: 9 days    Brigid Re , [facility] 10/14/2019, 7:48 AM Please see Amion for pager number during day hours 7:00am-4:30pm

## 2019-10-14 NOTE — Progress Notes (Signed)
Patient ingested half bottle of contrast. Couldn't drink anymore. CT aware.

## 2019-10-14 NOTE — Progress Notes (Signed)
I responded to consult for pastoral support for Dawn Foley. Upon entering Dawn Foley was mumbling and I was not able to understand her. I was present and offered her ministerial presence, silent prayer momentary silence. I called her mother Dawn Foley. She said is on her way to the hospital. She noted that she wishes she could be here longer, but due to the demands of Dawn Foley's two children, she finds it difficult to be present.  She stated that she wants her grand kids to see their mother despite their broken relationship, but was not sure of the visitation policy. I suggested that if was possible to face-time their mother and also concerns about how they would take seeing their mother in such a way. Dawn Foley voiced that they have seen her in worse experiences. She said that she felt the kids were ok with a possible visit. She said that someone was supposed to send her a link so that they can facetime.  Dawn Foley gave me her email: [email or web address] so the link can be sent.  Also, she said she was planning to have the kids write a letter so that she or if its not possible, Nursing staff could read it to Dawn Foley, but that is yet to be done. Dawn Foley said she knows her daughter is not doing well and would like to have grandkids connect with Dawn Foley before any possible surgery.   Palliative Care Chaplain Resident Dawn Levy MA [phone]

## 2019-10-14 NOTE — Progress Notes (Addendum)
Daily Rounding Note  10/14/2019, 9:45 AM  LOS: 9 days   SUBJECTIVE:   Chief complaint:     Pt expressing "hungry/thirsty", mild abd pain.  Also c/o pain in her arm when I tried to help her lift her arm.  No nausea. Asking to eat.   Surgery has ordered repeat CT and will advance diet based on findings.    200 mL stool recorded 2/3.  Down from 1300, 1250 mL prior 2 days.   Urine output: 1025 mL.  Pall GOC consultation involving pts mother on 2/2, pt is DNR.    OBJECTIVE:         Vital signs in last 24 hours:    Temp:  [98.4 F (36.9 C)-100 F (37.8 C)] 98.6 F (37 C) (02/03 0330) Pulse Rate:  [88-104] 101 (02/03 0330) Resp:  [13-19] 18 (02/03 0330) BP: (101-133)/(61-82) 110/61 (02/03 0330) SpO2:  [94 %-100 %] 100 % (02/03 0330) Last BM Date: 10/13/19 Filed Weights   10/08/19 0500 10/09/19 0429 10/13/19 0500  Weight: 88.1 kg 89 kg 85.9 kg   General: fretful, alert, speech continues mumbled.  Jaundiced.    Heart: RRR Chest: no labored resps or cough Abdomen: soft, tenderness bil R >> L.  Active BS, soft.    Extremities: pitting LE edema Neuro/Psych:  Tremors of UE w asterixis.  Not answering question re year, place.  Hit or miss as to wether she answers appropriately.  Fretful, anxious as she tries to express herself.    Intake/Output from previous day: 02/02 0701 - 02/03 0700 In: 1520.9 [P.O.:120; I.V.:870; IV Piggyback:531] Out: 825 [Urine:625; Stool:200]  Intake/Output this shift: No intake/output data recorded.  Lab Results: Recent Labs    10/12/19 0431  WBC 7.9  HGB 7.4*  HCT 25.5*  PLT 126*   BMET Recent Labs    10/12/19 0431 10/13/19 0335 10/14/19 0350  NA 155* 151* 146*  K 2.7* 3.0* 3.1*  CL 121* 120* 117*  CO2 23 22 21*  GLUCOSE 120* 107* 77  BUN 48* 41* 37*  CREATININE 1.28* 0.96 1.10*  CALCIUM 8.1* 8.1* 7.8*   LFT Recent Labs    10/12/19 0431 10/13/19 0335 10/13/19 0900  10/14/19 0350  PROT 6.4* 5.8*  --  5.4*  ALBUMIN 1.6* 1.7*  --  1.7*  AST 104* 90*  --  80*  ALT 51* 40  --  40  ALKPHOS 73 53  --  55  BILITOT 29.6* 29.2*  --  28.9*  BILIDIR 21.4*  --  16.9*  --   IBILI 8.2*  --   --   --    PT/INR Recent Labs    10/13/19 0335 10/14/19 0350  LABPROT 19.7* 20.5*  INR 1.7* 1.8*   Hepatitis Panel No results for input(s): HEPBSAG, HCVAB, HEPAIGM, HEPBIGM in the last 72 hours.  Studies/Results: No results found.  ASSESMENT:   *Acute hepatic failure, not exclusively due to alcoholic hepatitis. Discriminant function score 124. Hep C positive, undetectable HCV RNA. Several previous admissions for alc hep. Clinically improved but still signif ill.LFTs continue improving, including the laggard T bili.   Lille score 0.7/non-response to Prednisolone, this dcd on 2/2 day 7.    *AKI. Due to hypotension, shock. Overall improved, but worse in last 24 hours.  Making urine.    * Colitis, pneumatosis. Thickened loops of small bowel, likely secondary to same process affecting colon. C. difficile negative. Stool pathogen in process for 3  days.  7 d Zosyn stopped 2/1, Flagyl day 3, holding tube feeds, ok for ice chips. As she is not in shock and there is no peritonitis he does not see any indication for ORcurrently. "Mortalityissignificant if it comesto operationlikely should be palliative."  *Coagulopathy. IV vitamin K 3 doses 1/26 - 10/07/18 and again on 10/13/19. INR 1.7 >> 1.9 last 24 hours.    *   Anemia.  Normocytic.  Iron, TIBC, iron sat low.  Ferritin 161.  Folate, B12 okay.  *    Thrombocytopenia.  Improved. Acute on chronic.    *Pelvic ascites.1/26 diagnostic paracentesis, 400 nucleated cells, 37% neutrophils: not meeting criteria for SBP.   * HE.  Ammonia, mental status improved but remains signif compromised w po lactulose, Rifaximin.  Ammonia 163 >> 26  *Hypernatremia.  Improved with D5 infusion,  albumin.  Renal plans to continue these measures for the next 12 hours.  *Hypokalemia.  Persists though improved.  *     Hypoalbuminemia, hypoproteinemia.    PLAN   *   CTAP w contrast (repeat) today.    *  Leave Flagyl  In place but consider stopping vs adding cipro based on CT findings.   Continue Lactulose and Rifaximin.   Adding zinc 220 mg bid.    *   Pt/inr in AM.    *   Pt is on strict bedrest per orders.  Consider liberating the patient and initiating ambulation as tolerated, PT    Azucena Freed  10/14/2019, 9:45 AM Phone [phone]

## 2019-10-14 NOTE — Progress Notes (Signed)
Nutrition Follow-up  DOCUMENTATION CODES:   Not applicable  INTERVENTION:  -MVI with minerals daily -Boost Breeze po TID, each supplement provides 250 kcal and 9 grams of protein -RD will follow for diet advancement and adjust supplement regimen based upon preferences and goals of care  NUTRITION DIAGNOSIS:   Inadequate oral intake related to lethargy/confusion as evidenced by NPO status.  Progressing; advanced to clear liquid diet  GOAL:   Patient will meet greater than or equal to 90% of their needs  Progressing   MONITOR:   PO intake, Supplement acceptance, Diet advancement, Labs, Weight trends, Skin, I & O's  REASON FOR ASSESSMENT:   Consult Enteral/tube feeding initiation and management  ASSESSMENT:   34 yo female admitted with acute alcoholic hepatitis with acute liver failure. PMH includes hepatitis C, alcoholism, depression, bipolar D/O, colon cancer, hypothyroidism, CKD, seizures, smoker.  1/30- TF d/c 2/2- diet advanced to clear liquids  Reviewed I/O's: +696 ml x 24 hours and +406 ml since admission  UOP: 625 ml x 24 hours  Rectal tube output: 200 ml x 24 hours  Pt resting quietly at time of visit. RD did not disturb.   Per GI notes, pt complaining or hunger and wanting something to drink. Pt is currently on a clear liquid diet; will add Boost Breeze supplements for intake calorie and protein intake.   Per GI notes, plan to repeat CT to evaluate pneumatosis coli. If CT is improved, plan to advance diet.   Palliative care consulted for further goals of care discussions. Pt mother would like to observe for a few days and transition to comfort care if no improvement.   Medications reviewed and include lactulose.   Labs reviewed: Na: 146, K: 3.1, CBGS: 74-99 (inpatient orders for glycemic control are 0-15 units insulin aspart every 4 hours).   Diet Order:   Diet Order            Diet clear liquid Room service appropriate? Yes; Fluid consistency: Thin   Diet effective now              EDUCATION NEEDS:   Not appropriate for education at this time  Skin:  Skin Assessment: Skin Integrity Issues: Skin Integrity Issues:: Other (Comment) Other: skin tear to lt buttocks  Last BM:  10/14/19 (200 ml via rectal tube)  Height:   Ht Readings from Last 1 Encounters:  10/05/19 5\' 3"  (1.6 m)    Weight:   Wt Readings from Last 1 Encounters:  10/13/19 85.9 kg    Ideal Body Weight:  52.3 kg  BMI:  Body mass index is 33.55 kg/m.  Estimated Nutritional Needs:   Kcal:  1850-2050  Protein:  100-115 grams  Fluid:  > 1.8 L    Gayathri Futrell A. Jimmye Norman, RD, LDN, [place] Registered Dietitian II Certified Diabetes Care and Education Specialist Pager: [phone] After hours Pager: [phone]

## 2019-10-15 ENCOUNTER — Inpatient Hospital Stay (HOSPITAL_COMMUNITY): Payer: Self-pay

## 2019-10-15 DIAGNOSIS — R0609 Other forms of dyspnea: Secondary | ICD-10-CM

## 2019-10-15 DIAGNOSIS — R0600 Dyspnea, unspecified: Secondary | ICD-10-CM

## 2019-10-15 DIAGNOSIS — K7290 Hepatic failure, unspecified without coma: Secondary | ICD-10-CM

## 2019-10-15 DIAGNOSIS — R1084 Generalized abdominal pain: Secondary | ICD-10-CM

## 2019-10-15 DIAGNOSIS — K7210 Chronic hepatic failure without coma: Secondary | ICD-10-CM

## 2019-10-15 LAB — GI PATHOGEN PANEL BY PCR, STOOL

## 2019-10-15 LAB — COMPREHENSIVE METABOLIC PANEL
ALT: 37 U/L (ref 0–44)
AST: 81 U/L — ABNORMAL HIGH (ref 15–41)
Albumin: 1.6 g/dL — ABNORMAL LOW (ref 3.5–5.0)
Alkaline Phosphatase: 58 U/L (ref 38–126)
Anion gap: 8 (ref 5–15)
BUN: 38 mg/dL — ABNORMAL HIGH (ref 6–20)
CO2: 17 mmol/L — ABNORMAL LOW (ref 22–32)
Calcium: 8.1 mg/dL — ABNORMAL LOW (ref 8.9–10.3)
Chloride: 114 mmol/L — ABNORMAL HIGH (ref 98–111)
Creatinine, Ser: 1.66 mg/dL — ABNORMAL HIGH (ref 0.44–1.00)
GFR calc Af Amer: 47 mL/min — ABNORMAL LOW (ref >60)
GFR calc non Af Amer: 40 mL/min — ABNORMAL LOW (ref >60)
Glucose, Bld: 80 mg/dL (ref 70–99)
Potassium: 3.7 mmol/L (ref 3.5–5.1)
Sodium: 139 mmol/L (ref 135–145)
Total Bilirubin: 33.4 mg/dL (ref 0.3–1.2)
Total Protein: 5.6 g/dL — ABNORMAL LOW (ref 6.5–8.1)

## 2019-10-15 LAB — PROTIME-INR
INR: 1.8 — ABNORMAL HIGH (ref 0.8–1.2)
Prothrombin Time: 21 seconds — ABNORMAL HIGH (ref 11.4–15.2)

## 2019-10-15 LAB — GLUCOSE, CAPILLARY
Glucose-Capillary: 81 mg/dL (ref 70–99)
Glucose-Capillary: 69 mg/dL — ABNORMAL LOW (ref 70–99)
Glucose-Capillary: 123 mg/dL — ABNORMAL HIGH (ref 70–99)
Glucose-Capillary: 76 mg/dL (ref 70–99)
Glucose-Capillary: 77 mg/dL (ref 70–99)

## 2019-10-15 LAB — PREGNANCY, URINE: Preg Test, Ur: NEGATIVE

## 2019-10-15 LAB — MAGNESIUM: Magnesium: 2.1 mg/dL (ref 1.7–2.4)

## 2019-10-15 LAB — AMMONIA: Ammonia: 76 umol/L — ABNORMAL HIGH (ref 9–35)

## 2019-10-15 MED ORDER — LORAZEPAM 2 MG/ML IJ SOLN: 2.0000 mg | INTRAMUSCULAR | Status: DC | PRN

## 2019-10-15 MED ORDER — IOHEXOL 300 MG/ML  SOLN
100.0000 mL | Freq: Once | INTRAMUSCULAR | Status: AC | PRN
  Administered 2019-10-15: 100 mL via INTRAVENOUS

## 2019-10-15 MED ORDER — MORPHINE 100MG IN NS 100ML (1MG/ML) PREMIX INFUSION
1.0000 mg/h | INTRAVENOUS | Status: DC
  Administered 2019-10-15: 1 mg/h via INTRAVENOUS
  Filled 2019-10-15 (×2): qty 100

## 2019-10-15 MED ORDER — SODIUM CHLORIDE 0.9 % IV SOLN
400.0000 mg | Freq: Once | INTRAVENOUS | Status: AC
  Administered 2019-10-15: 400 mg via INTRAVENOUS
  Filled 2019-10-15: qty 4

## 2019-10-15 MED ORDER — LACTATED RINGERS IV BOLUS
500.0000 mL | Freq: Once | INTRAVENOUS | Status: AC
  Administered 2019-10-15: 500 mL via INTRAVENOUS

## 2019-10-15 MED ORDER — MORPHINE BOLUS VIA INFUSION
1.0000 mg | INTRAVENOUS | Status: DC | PRN
  Administered 2019-10-16: 1 mg via INTRAVENOUS
  Filled 2019-10-15: qty 1

## 2019-10-15 MED ORDER — CIPROFLOXACIN IN D5W 400 MG/200ML IV SOLN
400.0000 mg | Freq: Two times a day (BID) | INTRAVENOUS | Status: DC
  Administered 2019-10-15: 400 mg via INTRAVENOUS
  Filled 2019-10-15 (×2): qty 200

## 2019-10-15 MED ORDER — FUROSEMIDE 10 MG/ML IJ SOLN
40.0000 mg | Freq: Once | INTRAMUSCULAR | Status: AC
  Administered 2019-10-15: 40 mg via INTRAVENOUS
  Filled 2019-10-15: qty 4

## 2019-10-15 MED ORDER — GLYCOPYRROLATE 0.2 MG/ML IJ SOLN
0.4000 mg | Freq: Four times a day (QID) | INTRAMUSCULAR | Status: DC
  Administered 2019-10-15 – 2019-10-16 (×3): 0.4 mg via INTRAVENOUS
  Filled 2019-10-15 (×3): qty 2

## 2019-10-15 MED ORDER — MORPHINE SULFATE (PF) 2 MG/ML IV SOLN
2.0000 mg | INTRAVENOUS | Status: DC | PRN
  Administered 2019-10-15: 2 mg via INTRAVENOUS
  Filled 2019-10-15: qty 1

## 2019-10-15 MED ORDER — DEXTROSE 50 % IV SOLN
INTRAVENOUS | Status: AC
  Filled 2019-10-15: qty 50

## 2019-10-15 MED ORDER — DEXTROSE 50 % IV SOLN
12.5000 g | INTRAVENOUS | Status: AC
  Administered 2019-10-15: 12.5 g via INTRAVENOUS

## 2019-10-15 NOTE — Progress Notes (Signed)
Patient MEWS status changed to red with score of 4. T 101.8 and HR 116. Rapid response Mindy, RN notified. MD Baltazar Najjar paged. See new orders. Will continue to monitor.

## 2019-10-15 NOTE — Progress Notes (Signed)
Chaplain met family members and listened. They shared the patient had been saved as a young person.  Chaplain offered prayer for peace during this time and for her family to feel God's presence surrounding them.    They were taking phone calls and placing the phone up to patient's ear to hear from various people calling who wanted to say last words to the patient. Hopefully this wont be long and drawn out for the patient and the family. The family felt peace knowing patient was not in pain. Conard Novak, Chaplain   10/15/19 1500  Clinical Encounter Type  Visited With Patient and family together  Visit Type Follow-up;Spiritual support  Referral From Palliative care team  Consult/Referral To Chaplain  Spiritual Encounters  Spiritual Needs Prayer;Emotional  Stress Factors  Patient Stress Factors Other (Comment) (patient is transitioning)  Family Stress Factors Major life changes

## 2019-10-15 NOTE — Progress Notes (Signed)
[facility] Progress Note     Subjective: Patient is somewhat encephalopathic this AM. She does tell me that she has pain all over this AM but then mumbles nonsensically. She is lying in bed. Continues to have liquid stool output from rectal tube. Had CT early this AM.   Review of Systems  Unable to perform ROS: Mental acuity    Objective: Vital signs in last 24 hours: Temp:  [99.6 F (37.6 C)-103.1 F (39.5 C)] 102.8 F (39.3 C) (02/04 0745) Pulse Rate:  [102-118] 113 (02/04 0745) Resp:  [17-20] 18 (02/04 0547) BP: (100-123)/(58-71) 114/65 (02/04 0745) SpO2:  [97 %-98 %] 97 % (02/04 0745) Last BM Date: 10/15/19  Intake/Output from previous day: 02/03 0701 - 02/04 0700 In: 3417.6 [P.O.:720; I.V.:2247.5; IV Piggyback:450.1] Out: 550 [Urine:550] Intake/Output this shift: No intake/output data recorded.  PE: General: overweight white female who is laying in bed in NAD HEENT:  Sclera are icteric.  PERRL.  Ears and nose without any masses or lesions.  Mouth is pink and moist. Cortrak present Heart: regular, rate, and rhythm.  Normal s1,s2. No obvious murmurs, gallops, or rubs noted.  Palpable radial and pedal pulses bilaterally Lungs: CTAB, no wheezes, rhonchi, or rales noted.  Respiratory effort nonlabored Abd: soft, mild diffuse ttp, ND, +BS, rectal tube present with liquid stool in bag MS: all 4 extremities are symmetrical, mild edema in hands and feet bilaterally. Skin: warm and dry, severe jaundice Neuro: nonsensical speech, not following commands Psych: disoriented, tearful  Lab Results:  No results for input(s): WBC, HGB, HCT, PLT in the last 72 hours. BMET Recent Labs    10/14/19 0350 10/15/19 0631  NA 146* 139  K 3.1* 3.7  CL 117* 114*  CO2 21* 17*  GLUCOSE 77 80  BUN 37* 38*  CREATININE 1.10* 1.66*  CALCIUM 7.8* 8.1*   PT/INR Recent Labs    10/14/19 0350 10/15/19 0631  LABPROT 20.5* 21.0*  INR 1.8* 1.8*   CMP     Component Value  Date/Time   NA 139 10/15/2019 0631   K 3.7 10/15/2019 0631   CL 114 (H) 10/15/2019 0631   CO2 17 (L) 10/15/2019 0631   GLUCOSE 80 10/15/2019 0631   BUN 38 (H) 10/15/2019 0631   CREATININE 1.66 (H) 10/15/2019 0631   CALCIUM 8.1 (L) 10/15/2019 0631   CALCIUM 9.4 07/18/2017 0900   PROT 5.6 (L) 10/15/2019 0631   ALBUMIN 1.6 (L) 10/15/2019 0631   AST 81 (H) 10/15/2019 0631   ALT 37 10/15/2019 0631   ALKPHOS 58 10/15/2019 0631   BILITOT PENDING 10/15/2019 0631   GFRNONAA 40 (L) 10/15/2019 0631   GFRAA 47 (L) 10/15/2019 0631   Lipase     Component Value Date/Time   LIPASE 49 10/06/2019 0020       Studies/Results: CT ABDOMEN PELVIS W CONTRAST  Result Date: 10/15/2019 CLINICAL DATA:  Cirrhosis. EXAM: CT ABDOMEN AND PELVIS WITH CONTRAST TECHNIQUE: Multidetector CT imaging of the abdomen and pelvis was performed using the standard protocol following bolus administration of intravenous contrast. CONTRAST:  155mL OMNIPAQUE IOHEXOL 300 MG/ML  SOLN COMPARISON:  10/10/2019 FINDINGS: Lower chest: Minimal basilar atelectasis. Hepatobiliary: Liver is enlarged with markedly decreased attenuation compatible with diffuse fatty deposition. Gallbladder is nondistended with mucosal hyperemia and gallbladder wall thickening, stable since prior. No intrahepatic or extrahepatic biliary dilation. Pancreas: No focal mass lesion. No dilatation of the main duct. No intraparenchymal cyst. No peripancreatic edema. Spleen: No splenomegaly. No focal mass lesion. Adrenals/Urinary  Tract: No adrenal nodule or mass. Left kidney unremarkable. Tiny hypodensity in the interpolar right kidney is too small to characterize but likely benign. No evidence for hydroureter. Bladder is nondistended. Gas bubbles in the bladder lumen are presumably from recent instrumentation although infection could have this appearance. Stomach/Bowel: Stomach is decompressed. Feeding tube tip is in the transverse duodenum. Proximal small bowel is  decompressed. More distal small bowel loops contain enteric contrast center more distended, measuring up to 3.8 cm diameter. Patient is status post right hemicolectomy. Colon is opacified distal to the anastomosis. The pneumatosis seen in the colon previously has resolved in the interval. The extraluminal gas seen previously in the sigmoid mesocolon has resolved in the interval. There is some persistent irregular wall thickening in the sigmoid colon today. Rectal tube evident. Vascular/Lymphatic: No abdominal aortic aneurysm. Portal vein and superior mesenteric vein are patent without evidence for intraluminal gas. No abdominal aortic atherosclerotic calcification. There is no gastrohepatic or hepatoduodenal ligament lymphadenopathy. No retroperitoneal or mesenteric lymphadenopathy. No pelvic sidewall lymphadenopathy. Reproductive: The uterus is unremarkable.  There is no adnexal mass. Other: Small volume ascites noted in the abdomen, mainly around the liver and spleen. There is some interloop mesenteric fluid in the lower abdomen and pelvis with moderate volume free fluid in the central pelvis. Diffuse body wall edema noted. Musculoskeletal: No worrisome lytic or sclerotic osseous abnormality. IMPRESSION: 1. Interval resolution of the colonic pneumatosis and extraluminal gas in the sigmoid mesocolon. 2. Persistent irregular wall thickening in the sigmoid colon. 3. Small to moderate volume ascites with diffuse body wall edema. 4. Marked hepatomegaly with diffuse fatty deposition. 5. Gallbladder wall thickening and mucosal hyperemia, similar to prior. 6. Gas bubbles in the bladder lumen are presumably from recent instrumentation although infection could have this appearance. 7. Aortic Atherosclerosis (ICD10-I70.0). Electronically Signed   By: Misty Stanley M.D.   On: 10/15/2019 07:52    Anti-infectives: Anti-infectives (From admission, onward)   Start     Dose/Rate Route Frequency Ordered Stop   10/15/19 0800   ciprofloxacin (CIPRO) IVPB 400 mg     400 mg 200 mL/hr over 60 Minutes Intravenous Every 12 hours 10/15/19 0726     10/12/19 1100  metroNIDAZOLE (FLAGYL) IVPB 500 mg     500 mg 100 mL/hr over 60 Minutes Intravenous Every 8 hours 10/12/19 1014     10/06/19 2200  rifaximin (XIFAXAN) tablet 550 mg  Status:  Discontinued     550 mg Per Tube 2 times daily 10/06/19 0935 10/06/19 1014   10/06/19 1300  piperacillin-tazobactam (ZOSYN) IVPB 3.375 g  Status:  Discontinued     3.375 g 12.5 mL/hr over 240 Minutes Intravenous Every 8 hours 10/06/19 0121 10/12/19 1014   10/06/19 1015  rifaximin (XIFAXAN) tablet 550 mg     550 mg Per Tube 2 times daily 10/06/19 1014     10/06/19 0200  piperacillin-tazobactam (ZOSYN) IVPB 3.375 g  Status:  Discontinued     3.375 g 12.5 mL/hr over 240 Minutes Intravenous Every 8 hours 10/06/19 0120 10/06/19 0121   10/06/19 0130  piperacillin-tazobactam (ZOSYN) IVPB 3.375 g     3.375 g 100 mL/hr over 30 Minutes Intravenous STAT 10/06/19 0121 10/06/19 0249       Assessment/Plan Acute on chronic liver failure, severeChildsCcirrhosis-per GI Diffuse colonic enterocolitis with pneumatosis - not in shock and no peritonitis.  - Agree with stopping Zosyn after course of treatment/ nl WBC. Continue to follow.  - She does not have any indicators of  needing to go to OR at this point if evaluated using PIPES criteria. Her mortality is significant and if comes to operation likely should be palliative.  - she is tolerating CLD and have bowel function - follow up CT shows resolution in colonic pneumotosis, ok to advance as tolerated to soft diet - no indications for surgical intervention, and as mentioned above she is a poor surgical candidate - surgery will sign off at this time. Please call if we can be of further assistance  FEN: soft diet if alert enough - otherwise can give TF VTE: SCDs, INR 1.8 ID: [id number]/1>>  LOS: 10 days    Brigid Re , [facility] Surgery 10/15/2019, 7:59 AM Please see Amion for pager number during day hours 7:00am-4:30pm

## 2019-10-15 NOTE — Progress Notes (Signed)
Patient ID: Lafern Ruble, female   DOB: 14-Mar-1986, 34 y.o.   MRN: [id number]  This NP visited patient at the bedside as a follow up for palliative medicine needs and emotional support.      Patient is has had a  dramatic decline, elevated temp, hypotension, increased lethargy and tachycardia, minimal responsiveness  She appears generally uncomfortable and looks to be transitioning at EOL.  Meet with mother and grand-mother at bedside.  Continued conversation regarding current medical situation.   They understand the seriousness of the situation and limited prognosis.   Hope is for comfort and dignity at this time  Plan of care: -DNR/DNI -no artifical feeding or hydration now or in the future -no further diagnostics, or  life prolonging measure--allow a naturall death -symptom management  for pain, dyspnea, agitation  terminal secretions  (see MAR)    -initiate a morphine gtt with bolus -expect hospital death, prognosis is hours to days  Discussed natural trajectory and expectations at EOL  Chaplain consulted for family support  Questions and concerns addressed     Discussed with Dr Sloan Leiter via secure chat  Total time spent on the unit was 45 minutes  Greater than 50% of the time was spent in counseling and coordination of care  PMT will continue to support holistically   Wadie Lessen NP  Palliative Medicine Team Team Phone # [phone] Pager [phone]

## 2019-10-15 NOTE — Progress Notes (Signed)
PROGRESS NOTE    Dawn Foley  [id number] DOB: 06-06-1986 DOA: 10/05/2019 PCP: System, Pcp Not In    Brief Narrative:  Unfortunate chronically sick lady with chronic alcohol use drinking more than half a gallon of liquor daily, alcohol withdrawal seizure on Keppra, polysubstance abuse, schizophrenia, depression and anxiety, hepatitis C, Mallory-Weiss tear and hypothyroidism who presented to [facility] emergency room on 1/25 for several day history of worsening jaundice and encephalopathy.  She was found with acute liver failure with multiple metabolic derangements and coagulopathies and was transferred to [facility] ICU for further management.   1/25-2/2: Patient remained in intensive care unit and treated for alcohol withdrawal, hepatic encephalopathy, acute alcoholic hepatitis.  Mental status remains poor.  Overall clinical status remains poor.  Transfer out of ICU on 2/3.  1/25 tx from [address] to [facility] CT scan showed severe hepatic steatosis and hepatomegaly. 1/26 started on prednisolone, rifaxamin, had paracentesis of the left side of abdomen 70cc removed 1/28 Mental status, Ammonia level worse, bilirubin remains high while other lft's improve 1/29 Mental status and ammonia level improving, bilirubin down slightly, fever overnight, starting to withdrawal from alcohol 1/30 CT scan abdomen pelvis with extensive pneumatosis. Blood cultures negative.  C. difficile negative.  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 Poor prognosis. Treated  with prednisone, stopped. Followed by GI. Already has diarrhea, ammonia down to 26.  Ammonia level today pending.  Bilirubin is 33.4. Continue lactulose/rifaximin. Seizure precautions. Currently on Keppra, thiamine and folic acid.  As needed Ativan. Now developing temperatures, already on antibiotic for last 10 days.  Currently on Flagyl, will add ciprofloxacin for broad-spectrum intra-abdominal coverage.  Pneumatosis with diffuse severe enterocolitis:  Conservative management.  Surgery following.  Repeat CT scan shows improving pneumatosis. Received Zosyn for 7 days. Currently on Flagyl.  Add Cipro.    Acute kidney injury: Kidney functions are stable at this time.  On maintenance fluid.  Severe anemia, thrombocytopenia: With coagulopathy. On PPI.  Treated with vitamin K.  Hypokalemia: Replaced.  Hypothyroidism: On Synthroid.  Patient is more encephalopathic, bilirubin is trending up. Patient is likely in a terminal stage of fulminant hepatic failure. Appreciate palliative care help, they are meeting with mother today.  She may benefit with comfort care and hospice. Continue all supportive treatments for now.  DVT prophylaxis: SCDs Code Status: DNR Family Communication: Pending palliative meeting with family. Disposition Plan: patient is from home.. Anticipated DC to unknown, Barriers to discharge active severe disease, unknown outcome.   Consultants:   Gastroenterology  Surgery  Palliative medicine  Procedures:   None  Antimicrobials:  Anti-infectives (From admission, onward)   Start     Dose/Rate Route Frequency Ordered Stop   10/15/19 0800  ciprofloxacin (CIPRO) IVPB 400 mg     400 mg 200 mL/hr over 60 Minutes Intravenous Every 12 hours 10/15/19 0726     10/12/19 1100  metroNIDAZOLE (FLAGYL) IVPB 500 mg     500 mg 100 mL/hr over 60 Minutes Intravenous Every 8 hours 10/12/19 1014     10/06/19 2200  rifaximin (XIFAXAN) tablet 550 mg  Status:  Discontinued      550 mg Per Tube 2 times daily 10/06/19 0935 10/06/19 1014   10/06/19 1300  piperacillin-tazobactam (ZOSYN) IVPB 3.375 g  Status:  Discontinued     3.375 g 12.5 mL/hr over 240 Minutes Intravenous Every 8 hours 10/06/19 0121 10/12/19 1014   10/06/19 1015  rifaximin (XIFAXAN) tablet 550 mg     550 mg Per Tube 2 times daily 10/06/19 1014     10/06/19 0200  piperacillin-tazobactam (ZOSYN) IVPB 3.375 g  Status:  Discontinued     3.375 g 12.5 mL/hr over 240 Minutes Intravenous Every 8 hours 10/06/19 0120 10/06/19 0121   10/06/19 0130  piperacillin-tazobactam (ZOSYN) IVPB 3.375 g     3.375 g 100 mL/hr over 30 Minutes Intravenous STAT 10/06/19 0121 10/06/19 0249         Subjective: Patient seen and examined.  Was called by nurse to evaluate patient with high temperature and altered mental status. Patient tries to answer some questions, her voice is mumbled and incomprehensible.  States that she has some belly pain.  She wants to eat something.  Objective: Vitals:   10/15/19 0800 10/15/19 0830 10/15/19 0900 10/15/19 0945  BP: 115/70 117/68 (!) 103/53 (!) 95/53  Pulse: (!) 116 (!) 117 (!) 115 (!) 116  Resp:  (!) 21 20 20   Temp: (!) 102.9 F (39.4 C) (!) 102.8 F (39.3 C) (!) 102.9 F (39.4 C) (!) 102.2 F (39 C)  TempSrc: Oral Oral Oral Oral  SpO2: 96% 98% 95% 97%  Weight:      Height:        Intake/Output Summary (Last 24 hours) at 10/15/2019 1042 Last data filed at 10/15/2019 [id number] Gross per 24 hour  Intake 2967.49 ml  Output 550 ml  Net 2417.49 ml   Filed Weights   10/08/19 0500 10/09/19 0429 10/13/19 0500  Weight: 88.1 kg 89 kg 85.9 kg    Examination:  Physical Exam  Constitutional:  Sick looking, lethargic, deeply icteric.  On room air.  HENT:  Head: Atraumatic.  Eyes: Pupils are equal, round, and reactive to light. Scleral icterus is present.  jaundice  Cardiovascular: Regular rhythm.  Tachycardic.  Respiratory: Breath sounds normal.  GI: Bowel sounds are normal.    Mildly distended, diffusely tender with no localized rigidity or guarding.  Musculoskeletal:        General: Edema present.     Cervical back: Neck supple.     Comments: Anasarca 1+ all extremities.  Neurological: She is alert.  Psychiatric:  Sick looking, anxious with flat affect.       Data Reviewed: I have personally reviewed following labs and imaging studies  CBC: Recent Labs  Lab 10/08/19 1629 10/09/19 0600 10/10/19 0542 10/11/19 0536 10/12/19 0431  WBC 6.9 7.7 7.3 8.4 7.9  HGB 8.5* 7.8* 7.2* 8.2* 7.4*  HCT 26.7* 25.7* 24.9* 28.1* 25.5*  MCV 91.1 93.8 95.8 95.9 95.5  PLT 52* 64* 69* 125* 123XX123*   Basic Metabolic Panel: Recent Labs  Lab 10/10/19 0542 10/10/19 0542 10/10/19 1634 10/10/19 1634 10/11/19 0536 10/11/19 0536 10/11/19 1629 10/12/19 0431 10/13/19 0335 10/14/19 0350 10/15/19 0631  NA 142   < > 145   < > 149*   < > 153* 155* 151* 146* 139  K 3.1*   < > 3.8   < > 4.0   < > 3.2* 2.7* 3.0* 3.1* 3.7  CL 106   < > 110   < > 112*   < > 120*  121* 120* 117* 114*  CO2 24   < > 23   < > 23   < > 23 23 22  21* 17*  GLUCOSE 170*   < > 128*   < > 90   < > 149* 120* 107* 77 80  BUN 25*   < > 39*   < > 50*   < > 52* 48* 41* 37* 38*  CREATININE 0.77   < > 1.46*   < > 1.51*   < > 1.41* 1.28* 0.96 1.10* 1.66*  CALCIUM 6.7*   < > 7.2*   < > 7.5*   < > 7.8* 8.1* 8.1* 7.8* 8.1*  MG 2.1   < >  --   --  2.7*  --   --  2.7* 2.5* 2.4 2.1  PHOS 3.5  --  3.3  --  5.0*  --  4.7* 4.5  --   --   --    < > = values in this interval not displayed.   GFR: Estimated Creatinine Clearance: 50.5 mL/min (A) (by C-G formula based on SCr of 1.66 mg/dL (H)). Liver Function Tests: Recent Labs  Lab 10/11/19 0536 10/11/19 0536 10/11/19 1629 10/12/19 0431 10/13/19 0335 10/14/19 0350 10/15/19 0631  AST 143*  --   --  104* 90* 80* 81*  ALT 62*  --   --  51* 40 40 37  ALKPHOS 89  --   --  73 53 55 58  BILITOT 29.6*  --   --  29.6* 29.2* 28.9* 33.4*  PROT 6.8  --   --  6.4* 5.8* 5.4*  5.6*  ALBUMIN 1.7*   < > 1.6* 1.6* 1.7* 1.7* 1.6*   < > = values in this interval not displayed.   No results for input(s): LIPASE, AMYLASE in the last 168 hours. Recent Labs  Lab 10/09/19 0600 10/10/19 0542 10/11/19 0536 10/12/19 0431 10/13/19 0335  AMMONIA 91* 87* 58* 30 26   Coagulation Profile: Recent Labs  Lab 10/10/19 0542 10/11/19 0536 10/13/19 0335 10/14/19 0350 10/15/19 0631  INR 1.9* 1.7* 1.7* 1.8* 1.8*   Cardiac Enzymes: Recent Labs  Lab 10/12/19 1829  CKTOTAL 15*   BNP (last 3 results) No results for input(s): PROBNP in the last 8760 hours. HbA1C: No results for input(s): HGBA1C in the last 72 hours. CBG: Recent Labs  Lab 10/14/19 1959 10/14/19 2356 10/15/19 0438 10/15/19 0748 10/15/19 0851  GLUCAP 86 75 81 69* 123*   Lipid Profile: No results for input(s): CHOL, HDL, LDLCALC, TRIG, CHOLHDL, LDLDIRECT in the last 72 hours. Thyroid Function Tests: No results for input(s): TSH, T4TOTAL, FREET4, T3FREE, THYROIDAB in the last 72 hours. Anemia Panel: No results for input(s): VITAMINB12, FOLATE, FERRITIN, TIBC, IRON, RETICCTPCT in the last 72 hours. Sepsis Labs: Recent Labs  Lab 10/09/19 1013  PROCALCITON 1.02    Recent Results (from the past 240 hour(s))  MRSA PCR Screening     Status: Abnormal   Collection Time: 10/05/19 11:36 PM   Specimen: Nasal Mucosa; Nasopharyngeal  Result Value Ref Range Status   MRSA by PCR POSITIVE (A) NEGATIVE Final    Comment:        The GeneXpert MRSA Assay (FDA approved for NASAL specimens only), is one component of a comprehensive MRSA colonization surveillance program. It is not intended to diagnose MRSA infection nor to guide or monitor treatment for MRSA infections. RESULT CALLED TO, READ BACK BY AND VERIFIED WITH: Levi Aland RN 10/06/19 0106 JDW Performed  at [facility], [place] [address] [place], [place] [address]   Culture, blood (routine x 2)     Status: None   Collection Time: 10/06/19 12:25  AM   Specimen: BLOOD  Result Value Ref Range Status   Specimen Description BLOOD RIGHT ANTECUBITAL  Final   Special Requests   Final    BOTTLES DRAWN AEROBIC AND ANAEROBIC Blood Culture adequate volume   Culture   Final    NO GROWTH 5 DAYS Performed at [facility], [place] [address] [place], [place] [address]    Report Status 10/11/2019 FINAL  Final  Culture, blood (routine x 2)     Status: None   Collection Time: 10/06/19 12:45 AM   Specimen: BLOOD LEFT HAND  Result Value Ref Range Status   Specimen Description BLOOD LEFT HAND  Final   Special Requests   Final    BOTTLES DRAWN AEROBIC AND ANAEROBIC Blood Culture adequate volume   Culture   Final    NO GROWTH 5 DAYS Performed at [facility], [place] [address], [place], [place] [address]    Report Status 10/11/2019 FINAL  Final  Culture, Urine     Status: Abnormal   Collection Time: 10/06/19  1:42 AM   Specimen: Urine, Random  Result Value Ref Range Status   Specimen Description URINE, RANDOM  Final   Special Requests   Final    NONE Performed at [facility], [place] [address], [place], [place] [address]    Culture MULTIPLE SPECIES PRESENT, SUGGEST RECOLLECTION (A)  Final   Report Status 10/06/2019 FINAL  Final  Gram stain     Status: None   Collection Time: 10/06/19  3:11 PM   Specimen: Abdomen; Peritoneal Fluid  Result Value Ref Range Status   Specimen Description PERITONEAL  Final   Special Requests NONE  Final   Gram Stain   Final    FEW WBC PRESENT,BOTH PMN AND MONONUCLEAR NO ORGANISMS SEEN Performed at [facility], [place] [address], [place], [place] [address]    Report Status 10/06/2019 FINAL  Final  Culture, body fluid-bottle     Status: None   Collection Time: 10/06/19  3:11 PM   Specimen: Peritoneal Washings  Result Value Ref Range Status   Specimen Description PERITONEAL  Final   Special Requests NONE  Final   Culture   Final    NO GROWTH 5 DAYS Performed at [facility],  [address], [place], [place] [address]    Report Status 10/11/2019 FINAL  Final  C difficile quick scan w PCR reflex     Status: None   Collection Time: 10/11/19 10:08 AM   Specimen: Stool  Result Value Ref Range Status   C Diff antigen NEGATIVE NEGATIVE Final   C Diff toxin NEGATIVE NEGATIVE Final   C Diff interpretation No C. difficile detected.  Final    Comment: Performed at [facility], [place] [address], [place], [place] [address]         Radiology Studies: CT ABDOMEN PELVIS W CONTRAST  Result Date: 10/15/2019 CLINICAL DATA:  Cirrhosis. EXAM: CT ABDOMEN AND PELVIS WITH CONTRAST TECHNIQUE: Multidetector CT imaging of the abdomen and pelvis was performed using the standard protocol following bolus administration of intravenous contrast. CONTRAST:  114mL OMNIPAQUE IOHEXOL 300 MG/ML  SOLN COMPARISON:  10/10/2019 FINDINGS: Lower chest: Minimal basilar atelectasis. Hepatobiliary: Liver is enlarged with markedly decreased attenuation compatible with diffuse fatty deposition.  Gallbladder is nondistended with mucosal hyperemia and gallbladder wall thickening, stable since prior. No intrahepatic or extrahepatic biliary dilation. Pancreas: No focal mass lesion. No dilatation of the main duct. No intraparenchymal cyst. No peripancreatic edema. Spleen: No splenomegaly. No focal mass lesion. Adrenals/Urinary Tract: No adrenal nodule or mass. Left kidney unremarkable. Tiny hypodensity in the interpolar right kidney is too small to characterize but likely benign. No evidence for hydroureter. Bladder is nondistended. Gas bubbles in the bladder lumen are presumably from recent instrumentation although infection could have this appearance. Stomach/Bowel: Stomach is decompressed. Feeding tube tip is in the transverse duodenum. Proximal small bowel is decompressed. More distal small bowel loops contain enteric contrast center more distended, measuring up to 3.8 cm diameter. Patient is status post right  hemicolectomy. Colon is opacified distal to the anastomosis. The pneumatosis seen in the colon previously has resolved in the interval. The extraluminal gas seen previously in the sigmoid mesocolon has resolved in the interval. There is some persistent irregular wall thickening in the sigmoid colon today. Rectal tube evident. Vascular/Lymphatic: No abdominal aortic aneurysm. Portal vein and superior mesenteric vein are patent without evidence for intraluminal gas. No abdominal aortic atherosclerotic calcification. There is no gastrohepatic or hepatoduodenal ligament lymphadenopathy. No retroperitoneal or mesenteric lymphadenopathy. No pelvic sidewall lymphadenopathy. Reproductive: The uterus is unremarkable.  There is no adnexal mass. Other: Small volume ascites noted in the abdomen, mainly around the liver and spleen. There is some interloop mesenteric fluid in the lower abdomen and pelvis with moderate volume free fluid in the central pelvis. Diffuse body wall edema noted. Musculoskeletal: No worrisome lytic or sclerotic osseous abnormality. IMPRESSION: 1. Interval resolution of the colonic pneumatosis and extraluminal gas in the sigmoid mesocolon. 2. Persistent irregular wall thickening in the sigmoid colon. 3. Small to moderate volume ascites with diffuse body wall edema. 4. Marked hepatomegaly with diffuse fatty deposition. 5. Gallbladder wall thickening and mucosal hyperemia, similar to prior. 6. Gas bubbles in the bladder lumen are presumably from recent instrumentation although infection could have this appearance. 7. Aortic Atherosclerosis (ICD10-I70.0). Electronically Signed   By: Misty Stanley M.D.   On: 10/15/2019 07:52        Scheduled Meds: . chlorhexidine  15 mL Mouth Rinse BID  . Chlorhexidine Gluconate Cloth  6 each Topical Daily  . dextrose      . feeding supplement  1 Container Oral TID BM  . folic acid  1 mg Intravenous Daily  . insulin aspart  0-15 Units Subcutaneous Q4H  .  lactulose  30 g Per Tube BID  . levothyroxine  50 mcg Intravenous Q0600  . mouth rinse  15 mL Mouth Rinse q12n4p  . pantoprazole  40 mg Oral Q0600  . potassium chloride  40 mEq Oral BID  . QUEtiapine  100 mg Per Tube TID  . rifaximin  550 mg Per Tube BID  . sodium chloride flush  10-40 mL Intracatheter Q12H  . thiamine  100 mg Intravenous Daily  . zinc sulfate  220 mg Oral BID   Continuous Infusions: . sodium chloride Stopped (10/13/19 0911)  . ciprofloxacin 400 mg (10/15/19 0812)  . dextrose 5 % and 0.2 % NaCl 75 mL/hr at 10/15/19 0002  . levETIRAcetam 500 mg (10/15/19 0957)  . metronidazole 500 mg (10/15/19 0302)     LOS: 10 days    Time spent: 35 minutes    Barb Merino, MD [facility] Pager [phone]

## 2019-10-15 NOTE — Significant Event (Signed)
Rapid Response Event Note  Overview: Called d/t MEWS-4 for T-101.8 and HR-116. RN to notified provider. Please call RRT if assistance needed.   Dillard Essex

## 2019-10-15 NOTE — Progress Notes (Signed)
Dr. Sloan Leiter notified of below Vital signs   10/15/19 1100  Vitals  Temp (!) 102.1 F (38.9 C)  Temp Source Oral  BP (!) 90/56 (RN notified)  MAP (mmHg) 66  BP Location Left Arm  BP Method Automatic  Patient Position (if appropriate) Lying  Pulse Rate (!) 117  Pulse Rate Source Monitor  Resp 20  Oxygen Therapy  SpO2 94 %  O2 Device Room Air  MEWS Score  MEWS Temp 2  MEWS Systolic 1  MEWS Pulse 2  MEWS RR 0  MEWS LOC 0  MEWS Score 5  MEWS Score Color Red

## 2019-10-15 NOTE — Progress Notes (Signed)
Dr. Laurena Bering  notified at 0730 of temp 103.1 and pulse 118.  No plan to transfer level of care at this time. Will continue to monitor patient.

## 2019-10-15 NOTE — Progress Notes (Signed)
Attending 30 Attestation   I have taken an interval history, reviewed the chart and examined the patient.   Due to a computer malfunction/EPIC malfunction PA Gribben's note from 10/15/2019 has not been finalized.  It will be finalized on 10/16/2019.  Concern today that patient is having progressive worsening.  We will have to see how she does during the course of the day.  She is having progressive liver failure.  Prognosis is grim.  Supportive measures as per primary team.  I agree with the Advanced Practitioner's note, impression, and recommendations with updates and my documentation above.   Justice Britain, MD [facility] Office # [phone]

## 2019-10-15 NOTE — Progress Notes (Signed)
Hypoglycemic Event  CBG: 69  Treatment: dextrose  Symptoms: none  Follow-up CBG: Time 851 CBG Result:123  Possible Reasons for Event: not eating much  Comments/MD notified:dr. ghrimire   Theora Master

## 2019-10-15 NOTE — Progress Notes (Signed)
Daily Rounding Note  10/15/2019, 8:25 AM  LOS: 10 days   SUBJECTIVE:   Chief complaint:   Acute liver disease, fatty liver, HE.    Temp to 103.1 yest AM, to 102.8 this AM.  Tachy to 1teens.   550 mL urine yest.  Stool output not recorded.   Mental status worse today but still expresses being hungry.  Diet advanced to fulls this AM.    OBJECTIVE:         Vital signs in last 24 hours:    Temp:  [99.6 F (37.6 C)-103.1 F (39.5 C)] 102.9 F (39.4 C) (02/04 0800) Pulse Rate:  [102-118] 116 (02/04 0800) Resp:  [17-20] 18 (02/04 0547) BP: (100-123)/(58-71) 115/70 (02/04 0800) SpO2:  [96 %-98 %] 96 % (02/04 0800) Last BM Date: 10/15/19 Filed Weights   10/08/19 0500 10/09/19 0429 10/13/19 0500  Weight: 88.1 kg 89 kg 85.9 kg   General: less alert today.  Mumbling is worse.  Comfortable.  NGT in place.    Heart: RRR, tachy Chest: more effortful expiration, clear in front, no cough Abdomen: liquid/watery brown stool in flexi-seal.  Soft, no obvious tenderness.  BS present  Extremities: non-pitting LE and UE edema Neuro/Psych:  Responds to voice, barely following commands.  No tremor.    Intake/Output from previous day: 02/03 0701 - 02/04 0700 In: 3417.6 [P.O.:720; I.V.:2247.5; IV Piggyback:450.1] Out: 550 [Urine:550]  Intake/Output this shift: No intake/output data recorded.  Lab Results: No results for input(s): WBC, HGB, HCT, PLT in the last 72 hours. BMET Recent Labs    10/13/19 0335 10/14/19 0350 10/15/19 0631  NA 151* 146* 139  K 3.0* 3.1* 3.7  CL 120* 117* 114*  CO2 22 21* 17*  GLUCOSE 107* 77 80  BUN 41* 37* 38*  CREATININE 0.96 1.10* 1.66*  CALCIUM 8.1* 7.8* 8.1*   LFT Recent Labs    10/13/19 0335 10/13/19 0900 10/14/19 0350 10/15/19 0631  PROT 5.8*  --  5.4* 5.6*  ALBUMIN 1.7*  --  1.7* 1.6*  AST 90*  --  80* 81*  ALT 40  --  40 37  ALKPHOS 53  --  55 58  BILITOT 29.2*  --  28.9* 33.4*    BILIDIR  --  16.9*  --   --    PT/INR Recent Labs    10/14/19 0350 10/15/19 0631  LABPROT 20.5* 21.0*  INR 1.8* 1.8*   Hepatitis Panel No results for input(s): HEPBSAG, HCVAB, HEPAIGM, HEPBIGM in the last 72 hours.  Studies/Results: CT ABDOMEN PELVIS W CONTRAST  Result Date: 10/15/2019 CLINICAL DATA:  Cirrhosis. EXAM: CT ABDOMEN AND PELVIS WITH CONTRAST TECHNIQUE: Multidetector CT imaging of the abdomen and pelvis was performed using the standard protocol following bolus administration of intravenous contrast. CONTRAST:  141mL OMNIPAQUE IOHEXOL 300 MG/ML  SOLN COMPARISON:  10/10/2019 FINDINGS: Lower chest: Minimal basilar atelectasis. Hepatobiliary: Liver is enlarged with markedly decreased attenuation compatible with diffuse fatty deposition. Gallbladder is nondistended with mucosal hyperemia and gallbladder wall thickening, stable since prior. No intrahepatic or extrahepatic biliary dilation. Pancreas: No focal mass lesion. No dilatation of the main duct. No intraparenchymal cyst. No peripancreatic edema. Spleen: No splenomegaly. No focal mass lesion. Adrenals/Urinary Tract: No adrenal nodule or mass. Left kidney unremarkable. Tiny hypodensity in the interpolar right kidney is too small to characterize but likely benign. No evidence for hydroureter. Bladder is nondistended. Gas bubbles in the bladder lumen are presumably from recent instrumentation although  infection could have this appearance. Stomach/Bowel: Stomach is decompressed. Feeding tube tip is in the transverse duodenum. Proximal small bowel is decompressed. More distal small bowel loops contain enteric contrast center more distended, measuring up to 3.8 cm diameter. Patient is status post right hemicolectomy. Colon is opacified distal to the anastomosis. The pneumatosis seen in the colon previously has resolved in the interval. The extraluminal gas seen previously in the sigmoid mesocolon has resolved in the interval. There is some  persistent irregular wall thickening in the sigmoid colon today. Rectal tube evident. Vascular/Lymphatic: No abdominal aortic aneurysm. Portal vein and superior mesenteric vein are patent without evidence for intraluminal gas. No abdominal aortic atherosclerotic calcification. There is no gastrohepatic or hepatoduodenal ligament lymphadenopathy. No retroperitoneal or mesenteric lymphadenopathy. No pelvic sidewall lymphadenopathy. Reproductive: The uterus is unremarkable.  There is no adnexal mass. Other: Small volume ascites noted in the abdomen, mainly around the liver and spleen. There is some interloop mesenteric fluid in the lower abdomen and pelvis with moderate volume free fluid in the central pelvis. Diffuse body wall edema noted. Musculoskeletal: No worrisome lytic or sclerotic osseous abnormality. IMPRESSION: 1. Interval resolution of the colonic pneumatosis and extraluminal gas in the sigmoid mesocolon. 2. Persistent irregular wall thickening in the sigmoid colon. 3. Small to moderate volume ascites with diffuse body wall edema. 4. Marked hepatomegaly with diffuse fatty deposition. 5. Gallbladder wall thickening and mucosal hyperemia, similar to prior. 6. Gas bubbles in the bladder lumen are presumably from recent instrumentation although infection could have this appearance. 7. Aortic Atherosclerosis (ICD10-I70.0). Electronically Signed   By: Misty Stanley M.D.   On: 10/15/2019 07:52   Scheduled Meds: . chlorhexidine  15 mL Mouth Rinse BID  . Chlorhexidine Gluconate Cloth  6 each Topical Daily  . dextrose      . feeding supplement  1 Container Oral TID BM  . folic acid  1 mg Intravenous Daily  . insulin aspart  0-15 Units Subcutaneous Q4H  . lactulose  30 g Per Tube BID  . levothyroxine  50 mcg Intravenous Q0600  . mouth rinse  15 mL Mouth Rinse q12n4p  . pantoprazole  40 mg Oral Q0600  . potassium chloride  40 mEq Oral BID  . QUEtiapine  100 mg Per Tube TID  . rifaximin  550 mg Per Tube  BID  . sodium chloride flush  10-40 mL Intracatheter Q12H  . thiamine  100 mg Intravenous Daily  . zinc sulfate  220 mg Oral BID   Continuous Infusions: . sodium chloride Stopped (10/13/19 0911)  . ciprofloxacin 400 mg (10/15/19 0812)  . dextrose 5 % and 0.2 % NaCl 75 mL/hr at 10/15/19 0002  . ibuprofen (CALDOLOR) IV 400 mg (10/15/19 0803)  . levETIRAcetam 500 mg (10/15/19 0001)  . 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 Discriminant function score 124.  Clinically worse today.LFTs overall improved, but T bili worsening.  Lille score0.7/non-response to Prednisolone, this dcd on 2/2 day 7.  *AKI.Worsening after having improved which may reflect yesterday's IV contrast.  Making urine.  * Colitis, pneumatosis. Persistent sigmoid wall thickening.  C. difficile negative.  7 d Zosyn stopped 2/1, Flagyl day 4, Cipro d 1.   Pneumatosis resolved per CT #2 of 2/3.    *   Fever.  No repeat blood clxs.    *Coagulopathy. IV vitamin K 3 doses 1/26 - 10/07/18  and again on 10/13/19. INR 1.8.    *Anemia, progressive. Normocytic. Iron, TIBC, iron sat low. Ferritin 161. Folate,B12 okay.  *Thrombocytopenia. Improved. Acute on chronic.  *Pelvic ascites.1/26 diagnostic paracentesis, 400 nucleated cells, 37% neutrophils: not meeting criteria for SBP.   * HE.Ammonia, mental statusimproved but remains signif compromised w po lactulose, Rifaximin, Zinc.Ammonia 163 >> 26  *Hypoalbuminemia, hypoproteinemia.  protein malnutrition/depletion.     PLAN   *   Advance diet to soft, feed only w supervision and when alert enough for po.  Would start tube feedings per RD guidelines.         Azucena Freed  10/15/2019, 8:25 AM Phone [phone]

## 2019-10-16 DIAGNOSIS — Z515 Encounter for palliative care: Secondary | ICD-10-CM

## 2019-10-16 DIAGNOSIS — R451 Restlessness and agitation: Secondary | ICD-10-CM

## 2019-10-16 MED ORDER — LORAZEPAM 2 MG/ML IJ SOLN
2.0000 mg | Freq: Four times a day (QID) | INTRAMUSCULAR | Status: DC
  Administered 2019-10-16: 2 mg via INTRAVENOUS

## 2019-10-16 MED ORDER — LORAZEPAM 2 MG/ML IJ SOLN
INTRAMUSCULAR | Status: AC
  Administered 2019-10-16: 2 mg via INTRAVENOUS
  Filled 2019-10-16: qty 1

## 2019-10-16 MED ORDER — IBUPROFEN 100 MG/5ML PO SUSP
400.0000 mg | Freq: Four times a day (QID) | ORAL | Status: DC | PRN
  Administered 2019-10-16: 400 mg via ORAL
  Filled 2019-10-16: qty 20

## 2019-10-16 NOTE — Social Work (Addendum)
12:22pm- Pt approved for [facility], pt mother will complete paperwork. Cheri has requested, as well as this Probation officer, for discharge summary, DNR, and PTAR scheduled. CSW has scheduled PTAR for 2:00pm; bedside RN Benancio Deeds also aware.   11:21am- CSW made referral to [facility].  Provided information to [facility], liaison. She will reach out to [place] mother. This was also communicated to [facility] with PMT, appreciate her assistance.   Westley Hummer, MSW, [place] Work

## 2019-10-16 NOTE — Progress Notes (Signed)
Discharge   Pt was transported by PTAR to [facility]. Pt sent with flexiseal, PIV and midline. Pt received 1mg  Morphine bolus prior to transport. Pt currently resting comfortable.   Report called to [place].

## 2019-10-16 NOTE — Social Work (Signed)
Clinical Social Worker facilitated patient discharge including contacting patient family and facility to confirm patient discharge plans.  Clinical information faxed to facility and family agreeable with plan.  CSW arranged ambulance transport via PTAR to [facility]. Ensure pt has DNR form signed.  RN to call [phone]  with report prior to discharge. Clinical Social Worker will sign off for now as social work intervention is no longer needed. Please consult [place] again if new need arises.  Westley Hummer, MSW, LCSW Clinical Social Worker

## 2019-10-16 NOTE — Discharge Summary (Signed)
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  She was found with acute liver failure with multiple metabolic derangements and coagulopathies and was transferred to [facility] ICU for further management.   1/25-2/2: Patient remained in intensive care unit and treated for alcohol withdrawal, hepatic encephalopathy, acute alcoholic hepatitis.  Mental status remains poor.  Overall clinical status remains poor.  Transfer out of ICU on 2/3.  Acute on chronic fulminant liver failure with acute metabolic encephalopathy, hepatic encephalopathy, Acute kidney injury Severe anemia and thrombocytopenia with chronic liver disease Electrolyte abnormalities Treated with aggressive measures including all supportive measures, antibiotics, steroids.   No adequate improvement.  Patient continues to be encephalopathic, bilirubin continues to worsen.   Patient was uncomfortable and continued to decline despite maximal medical therapy.   Family agreed for comfort care measures.   Changed to comfort care on 10/15/2019.   Provided end-of-life care while in the hospital.   Transferring to [facility] of [facility] today to continue to provide end-of-life care.   Patient will be medicated before transfer.    Discharge Diagnoses:  Active Problems:   Acute  encephalopathy   Abdominal fluid collection   Abnormal transaminases   Coagulopathy (HCC)   Ascites due to alcoholic hepatitis   Palliative care by specialist   Encounter for hospice care   End stage liver disease ([place])   Dyspnea   Pain, abdominal, generalized   Agitation    Discharge Instructions  Discharge Instructions    Diet general   Complete by: As directed    Pleasure feeding as tolerated     Allergies as of 10/16/2019      Reactions   Ondansetron Nausea And Vomiting   Fish Allergy Nausea And Vomiting   Grapeseed Extract [nutritional Supplements] Hives   Other Rash   Polyester      Medication List    STOP taking these medications   haloperidol 10 MG tablet Commonly known as: HALDOL   levETIRAcetam 500 MG tablet Commonly known as: KEPPRA   levothyroxine 100 MCG tablet Commonly known as: SYNTHROID   QUEtiapine 300 MG tablet Commonly known as: SEROQUEL   traZODone 50 MG tablet Commonly known as: DESYREL       Allergies  Allergen Reactions  . Ondansetron Nausea And Vomiting  . Fish Allergy Nausea And Vomiting  . Grapeseed Extract [Nutritional Supplements] Hives  . Other Rash    Polyester    Consultations:  Critical care  Palliative medicine  Gastroenterology   Procedures/Studies: US PELVIS LIMITED (TRANSABDOMINAL ONLY)  Result Date: 10/06/2019 CLINICAL DATA:  Ascites.  Abnormal CT EXAM: LIMITED ULTRASOUND OF PELVIS TECHNIQUE: Limited transabdominal ultrasound examination of the pelvis was performed. COMPARISON:  CT 10/05/2019 FINDINGS: Within the right lower quadrant there is a somewhat lobulated fluid collection measuring 8.0 x 4.5 x 8.5 cm. This collection does not appear freely communicating with the small volume ascites within the abdomen and pelvis. IMPRESSION: Small volume abdominopelvic ascites with a complex appearing  fluid collection within the right lower quadrant which does not appear to directly communicate with the free fluid.  Findings could represent a loculated component of ascites versus an intrapelvic abscess. Electronically Signed   By: Davina Poke D.O.   On: 10/06/2019 11:43   CT ABDOMEN PELVIS W CONTRAST  Result Date: 10/15/2019 CLINICAL DATA:  Cirrhosis. EXAM: CT ABDOMEN AND PELVIS WITH CONTRAST TECHNIQUE: Multidetector CT imaging of the abdomen and pelvis was performed using the standard protocol following bolus administration of intravenous contrast. CONTRAST:  124mL OMNIPAQUE IOHEXOL 300 MG/ML  SOLN COMPARISON:  10/10/2019 FINDINGS: Lower chest: Minimal basilar atelectasis. Hepatobiliary: Liver is enlarged with markedly decreased attenuation compatible with diffuse fatty deposition. Gallbladder is nondistended with mucosal hyperemia and gallbladder wall thickening, stable since prior. No intrahepatic or extrahepatic biliary dilation. Pancreas: No focal mass lesion. No dilatation of the main duct. No intraparenchymal cyst. No peripancreatic edema. Spleen: No splenomegaly. No focal mass lesion. Adrenals/Urinary Tract: No adrenal nodule or mass. Left kidney unremarkable. Tiny hypodensity in the interpolar right kidney is too small to characterize but likely benign. No evidence for hydroureter. Bladder is nondistended. Gas bubbles in the bladder lumen are presumably from recent instrumentation although infection could have this appearance. Stomach/Bowel: Stomach is decompressed. Feeding tube tip is in the transverse duodenum. Proximal small bowel is decompressed. More distal small bowel loops contain enteric contrast center more distended, measuring up to 3.8 cm diameter. Patient is status post right hemicolectomy. Colon is opacified distal to the anastomosis. The pneumatosis seen in the colon previously has resolved in the interval. The extraluminal gas seen previously in the sigmoid mesocolon has resolved in the interval. There is some persistent irregular wall thickening in the sigmoid colon today. Rectal tube evident.  Vascular/Lymphatic: No abdominal aortic aneurysm. Portal vein and superior mesenteric vein are patent without evidence for intraluminal gas. No abdominal aortic atherosclerotic calcification. There is no gastrohepatic or hepatoduodenal ligament lymphadenopathy. No retroperitoneal or mesenteric lymphadenopathy. No pelvic sidewall lymphadenopathy. Reproductive: The uterus is unremarkable.  There is no adnexal mass. Other: Small volume ascites noted in the abdomen, mainly around the liver and spleen. There is some interloop mesenteric fluid in the lower abdomen and pelvis with moderate volume free fluid in the central pelvis. Diffuse body wall edema noted. Musculoskeletal: No worrisome lytic or sclerotic osseous abnormality. IMPRESSION: 1. Interval resolution of the colonic pneumatosis and extraluminal gas in the sigmoid mesocolon. 2. Persistent irregular wall thickening in the sigmoid colon. 3. Small to moderate volume ascites with diffuse body wall edema. 4. Marked hepatomegaly with diffuse fatty deposition. 5. Gallbladder wall thickening and mucosal hyperemia, similar to prior. 6. Gas bubbles in the bladder lumen are presumably from recent instrumentation although infection could have this appearance. 7. Aortic Atherosclerosis (ICD10-I70.0). Electronically Signed   By: Misty Stanley M.D.   On: 10/15/2019 07:52   CT ABDOMEN PELVIS W CONTRAST  Addendum Date: 10/10/2019   ADDENDUM REPORT: 10/10/2019 12:47 ADDENDUM: Findings called to Dr. Jennet Maduro. Electronically Signed   By: Dorise Bullion III M.D   On: 10/10/2019 12:47   Result Date: 10/10/2019 CLINICAL DATA:  Liver disease. Chronic portal hypertension assessment. Evaluate for fluid collection. EXAM: CT ABDOMEN AND PELVIS WITH CONTRAST TECHNIQUE: Multidetector CT imaging of the abdomen and pelvis was performed using the standard protocol following bolus administration of intravenous contrast. CONTRAST:  155mL OMNIPAQUE IOHEXOL 300 MG/ML  SOLN COMPARISON:   October 05, 2019 FINDINGS: Lower chest: No acute abnormality. Hepatobiliary: Severe hepatic steatosis is identified. The  liver does not demonstrate a definitive nodular contour. Hepatomegaly is identified. The liver measures 27 cm in cranial caudal dimension. Focal fatty sparing is seen adjacent to the gallbladder. The gallbladder wall is prominent. A small amount of pericholecystic fluid is not excluded. The gallbladder is otherwise unremarkable. The portal vein is patent. Pancreas: Unremarkable. No pancreatic ductal dilatation or surrounding inflammatory changes. Spleen: The spleen measures 14 cm in cranial caudal dimension, prominent. Adrenals/Urinary Tract: There is a small probable cyst in the right kidney, too small to characterize. No other renal masses identified. No hydronephrosis or perinephric stranding. The ureters are normal in caliber with no stones. The bladder is decompressed with a Foley catheter. Air in the bladder is consistent with a Foley catheter. Stomach/Bowel: There is a feeding tube in the stomach which terminates in the distal third portion of the duodenum. The stomach is normal in appearance. There is no evidence of small-bowel obstruction. The distal small bowel is thick walled and mildly prominent caliber measuring 2.9 cm in diameter. The colon is grossly abnormal with pneumatosis throughout the entire length of the colon from cecum through sigmoid colon. The patient is status post appendectomy. Vascular/Lymphatic: The abdominal aorta is normal in caliber with no atherosclerosis. No adenopathy. Reproductive: Uterus and bilateral adnexa are unremarkable. Other: There is ascites in the abdomen, particularly in the pelvis and extending down the pericolic gutters. There is air in the pericolonic fat, likely within veins given its distribution, particularly in the left side of the abdomen, likely secondary to the pneumatosis. No free air is identified. Musculoskeletal: No acute or significant  osseous findings. IMPRESSION: 1. Pneumatosis throughout the entire length of the colon. There is gas in the intraperitoneal fat adjacent to the left side of the colon, likely venous. This gas does not extend into the SMV or portal vein. The etiology of the pneumatosis and venous gas is unclear. This could represent sequela of severe enterocolitis. Vascular etiologies are considered less likely given the patient's young age and lack of obvious vascular disease. Recommend clinical correlation. 2. Prominent thick walled loops of small bowel in the right lower quadrant are likely secondary to the same process affecting the colon. 3. Gallbladder wall thickening. There is a possible small amount of pericholecystic fluid. Given the history of liver disease and the ascites, these findings are nonspecific. If there is concern for acute cholecystitis, a right upper quadrant ultrasound would be more sensitive and specific. 4. Ascites. Hepatomegaly. The spleen is borderline to mildly enlarged as well. 5. Severe hepatic steatosis. 6. Gallbladder wall thickening. There is a possible small amount of pericholecystic fluid. Given the history of liver disease and the ascites, these findings are nonspecific. If there is concern for acute cholecystitis, a right upper quadrant ultrasound would be more sensitive and specific. 7. No other abnormalities. Findings will be called to the referring physician. Electronically Signed: By: Dorise Bullion III M.D On: 10/10/2019 12:39   US Abdomen Limited  Result Date: 10/11/2019 CLINICAL DATA:  History of hepatitis C, now with concern for portal venous hypertension and ascites. Please perform ascites search ultrasound ultrasound-guided paracentesis as indicated. EXAM: LIMITED ABDOMEN ULTRASOUND FOR ASCITES TECHNIQUE: Limited ultrasound survey for ascites was performed in all four abdominal quadrants. COMPARISON:  CT abdomen pelvis-10/10/2019 FINDINGS: Sonographic evaluation of the abdomen  demonstrates a trace amount fluid within the left lower abdominal quadrant, too small to allow for safe ultrasound-guided paracentesis. No paracentesis attempted. IMPRESSION: Trace amount of intra-abdominal ascites, too small to allow for safe  ultrasound-guided paracentesis. No paracentesis attempted. Electronically Signed   By: Sandi Mariscal M.D.   On: 10/11/2019 10:58   VAS [place] LOWER EXTREMITY VENOUS (DVT)  Result Date: 10/07/2019  Lower Venous Study Indications: Swelling.  Risk Factors: Cancer Colon 2009. Comparison Study: Right LEV 06-13-18, negative. Performing Technologist: Baldwin Crown ARDMS, RVT  Examination Guidelines: A complete evaluation includes B-mode imaging, spectral Doppler, color Doppler, and power Doppler as needed of all accessible portions of each vessel. Bilateral testing is considered an integral part of a complete examination. Limited examinations for reoccurring indications may be performed as noted.  +---------+---------------+---------+-----------+----------+--------------+ RIGHT    CompressibilityPhasicitySpontaneityPropertiesThrombus Aging +---------+---------------+---------+-----------+----------+--------------+ CFV      Full           Yes      Yes                                 +---------+---------------+---------+-----------+----------+--------------+ SFJ      Full                                                        +---------+---------------+---------+-----------+----------+--------------+ FV Prox  Full                                                        +---------+---------------+---------+-----------+----------+--------------+ FV Mid   Full                                                        +---------+---------------+---------+-----------+----------+--------------+ FV DistalFull                                                        +---------+---------------+---------+-----------+----------+--------------+ PFV      Full                                                         +---------+---------------+---------+-----------+----------+--------------+ POP      Full           Yes      Yes                                 +---------+---------------+---------+-----------+----------+--------------+ PTV      Full                                                        +---------+---------------+---------+-----------+----------+--------------+ PERO     Full                                                        +---------+---------------+---------+-----------+----------+--------------+   +---------+---------------+---------+-----------+----------+--------------+  LEFT     CompressibilityPhasicitySpontaneityPropertiesThrombus Aging +---------+---------------+---------+-----------+----------+--------------+ CFV      Full           Yes      Yes                                 +---------+---------------+---------+-----------+----------+--------------+ SFJ      Full                                                        +---------+---------------+---------+-----------+----------+--------------+ FV Prox  Full                                                        +---------+---------------+---------+-----------+----------+--------------+ FV Mid   Full                                                        +---------+---------------+---------+-----------+----------+--------------+ FV DistalFull                                                        +---------+---------------+---------+-----------+----------+--------------+ PFV      Full                                                        +---------+---------------+---------+-----------+----------+--------------+ POP      Full           Yes      Yes                                 +---------+---------------+---------+-----------+----------+--------------+ PTV      Full                                                         +---------+---------------+---------+-----------+----------+--------------+ PERO     Full                                                        +---------+---------------+---------+-----------+----------+--------------+ Poorly visualized bilateral calf veins due to patient body habitus.    Summary: Right: There is no evidence of deep vein thrombosis in the lower extremity. No cystic structure found in the popliteal fossa. Left: There is no evidence of deep vein thrombosis in the lower  extremity. No cystic structure found in the popliteal fossa.  *See table(s) above for measurements and observations. Electronically signed by Deitra Mayo MD on 10/07/2019 at 7:49:47 AM.    Final    IR Paracentesis  Result Date: 10/06/2019 INDICATION: 34 year old female with history of recurrent alcoholic hepatitis presents for diagnostic paracentesis. EXAM: ULTRASOUND GUIDED DIAGNOSTIC PARACENTESIS MEDICATIONS: 1% lidocaine and 5 mL COMPLICATIONS: None immediate. PROCEDURE: Informed written consent was obtained from the patient after a discussion of the risks, benefits and alternatives to treatment. A timeout was performed prior to the initiation of the procedure. Initial ultrasound scanning demonstrates a small amount of ascites within the right lower abdominal quadrant. The right lower abdomen was prepped and draped in the usual sterile fashion. 1% lidocaine was used for local anesthesia. Following this, a 19 gauge, 7-cm, Yueh catheter was introduced. An ultrasound image was saved for documentation purposes. The paracentesis was performed. The catheter was removed and a dressing was applied. The patient tolerated the procedure well without immediate post procedural complication. FINDINGS: A total of approximately 70 mL of amber fluid was removed. Samples were sent to the laboratory as requested by the clinical team. IMPRESSION: Successful ultrasound-guided diagnostic paracentesis yielding 70 mL of  peritoneal fluid. Read by Rushie Nyhan NP Electronically Signed   By: Jacqulynn Cadet M.D.   On: 10/06/2019 15:12    Subjective: Patient seen and examined.  Mother and grandmother were at the bedside.  She remained fairly comfortable on low-dose morphine infusion. She was intermittently awake, mumbled about being hungry and wanted some milk.   Discharge Exam: Vitals:   10/15/19 1200 10/16/19 0521  BP: (!) 97/51 (!) 108/56  Pulse: (!) 113 (!) 110  Resp: (!) 24 20  Temp: (!) 102.1 F (38.9 C) (!) 101.5 F (38.6 C)  SpO2: 93% 96%   Vitals:   10/15/19 0945 10/15/19 1100 10/15/19 1200 10/16/19 0521  BP: (!) 95/53 (!) 90/56 (!) 97/51 (!) 108/56  Pulse: (!) 116 (!) 117 (!) 113 (!) 110  Resp: 20 20 (!) 24 20  Temp: (!) 102.2 F (39 C) (!) 102.1 F (38.9 C) (!) 102.1 F (38.9 C) (!) 101.5 F (38.6 C)  TempSrc: Oral Oral Oral Oral  SpO2: 97% 94% 93% 96%  Weight:      Height:        General: Pt is sleepy and lethargic.  Not on any distress currently on morphine infusion.  Sick looking.  Deeply icteric. Cardiovascular: RRR, tachycardic. Respiratory: CTA bilaterally, conducted airway sounds. Abdominal: Mild diffuse tenderness all over, ND, bowel sounds + Extremities: Anasarca.    The results of significant diagnostics from this hospitalization (including imaging, microbiology, ancillary and laboratory) are listed below for reference.     Microbiology: Recent Results (from the past 240 hour(s))  Gram stain     Status: None   Collection Time: 10/06/19  3:11 PM   Specimen: Abdomen; Peritoneal Fluid  Result Value Ref Range Status   Specimen Description PERITONEAL  Final   Special Requests NONE  Final   Gram Stain   Final    FEW WBC PRESENT,BOTH PMN AND MONONUCLEAR NO ORGANISMS SEEN Performed at [facility], [address], [place], [place] [address]    Report Status 10/06/2019 FINAL  Final  Culture, body fluid-bottle     Status: None   Collection Time:  10/06/19  3:11 PM   Specimen: Peritoneal Washings  Result Value Ref Range Status   Specimen Description PERITONEAL  Final  Special Requests NONE  Final   Culture   Final    NO GROWTH 5 DAYS Performed at [facility], [place] [address], [place], [place] [address]    Report Status 10/11/2019 FINAL  Final  GI pathogen panel by PCR, stool     Status: None   Collection Time: 10/11/19 10:08 AM   Specimen: Stool  Result Value Ref Range Status   Plesiomonas shigelloides NOT DETECTED NOT DETECTED Final   Yersinia enterocolitica NOT DETECTED NOT DETECTED Final   Vibrio NOT DETECTED NOT DETECTED Final   Enteropathogenic E coli NOT DETECTED NOT DETECTED Final   E coli (ETEC) LT/ST NOT DETECTED NOT DETECTED Final   E coli [id number] by PCR Not applicable NOT DETECTED Final   Cryptosporidium by PCR NOT DETECTED NOT DETECTED Final   Entamoeba histolytica NOT DETECTED NOT DETECTED Final   Adenovirus F 40/41 NOT DETECTED NOT DETECTED Final   Norovirus GI/GII NOT DETECTED NOT DETECTED Final   Sapovirus NOT DETECTED NOT DETECTED Final    Comment: (NOTE) Performed At: [facility] [place], [place] [id number] Rush Farmer MD [id number]    Vibrio cholerae NOT DETECTED NOT DETECTED Final   Campylobacter by PCR NOT DETECTED NOT DETECTED Final   Salmonella by PCR NOT DETECTED NOT DETECTED Final   E coli (STEC) NOT DETECTED NOT DETECTED Final   Enteroaggregative E coli NOT DETECTED NOT DETECTED Final   Shigella by PCR NOT DETECTED NOT DETECTED Final   Cyclospora cayetanensis NOT DETECTED NOT DETECTED Final   Astrovirus NOT DETECTED NOT DETECTED Final   G lamblia by PCR NOT DETECTED NOT DETECTED Final   Rotavirus A by PCR NOT DETECTED NOT DETECTED Final  C difficile quick scan w PCR reflex     Status: None   Collection Time: 10/11/19 10:08 AM   Specimen: Stool  Result Value Ref Range Status   C Diff antigen NEGATIVE NEGATIVE Final   C Diff toxin NEGATIVE NEGATIVE Final    C Diff interpretation No C. difficile detected.  Final    Comment: Performed at [facility], [place] [address] [place], [place] [address]     Labs: BNP (last 3 results) No results for input(s): BNP in the last 8760 hours. Basic Metabolic Panel: Recent Labs  Lab 10/10/19 0542 10/10/19 0542 10/10/19 1634 10/10/19 1634 10/11/19 0536 10/11/19 0536 10/11/19 1629 10/12/19 0431 10/13/19 0335 10/14/19 0350 10/15/19 0631  NA 142   < > 145   < > 149*   < > 153* 155* 151* 146* 139  K 3.1*   < > 3.8   < > 4.0   < > 3.2* 2.7* 3.0* 3.1* 3.7  CL 106   < > 110   < > 112*   < > 120* 121* 120* 117* 114*  CO2 24   < > 23   < > 23   < > 23 23 22  21* 17*  GLUCOSE 170*   < > 128*   < > 90   < > 149* 120* 107* 77 80  BUN 25*   < > 39*   < > 50*   < > 52* 48* 41* 37* 38*  CREATININE 0.77   < > 1.46*   < > 1.51*   < > 1.41* 1.28* 0.96 1.10* 1.66*  CALCIUM 6.7*   < > 7.2*   < > 7.5*   < > 7.8* 8.1* 8.1* 7.8* 8.1*  MG 2.1   < >  --   --  2.7*  --   --  2.7* 2.5* 2.4 2.1  PHOS 3.5  --  3.3  --  5.0*  --  4.7* 4.5  --   --   --    < > = values in this interval not displayed.   Liver Function Tests: Recent Labs  Lab 10/11/19 0536 10/11/19 0536 10/11/19 1629 10/12/19 0431 10/13/19 0335 10/14/19 0350 10/15/19 0631  AST 143*  --   --  104* 90* 80* 81*  ALT 62*  --   --  51* 40 40 37  ALKPHOS 89  --   --  73 53 55 58  BILITOT 29.6*  --   --  29.6* 29.2* 28.9* 33.4*  PROT 6.8  --   --  6.4* 5.8* 5.4* 5.6*  ALBUMIN 1.7*   < > 1.6* 1.6* 1.7* 1.7* 1.6*   < > = values in this interval not displayed.   No results for input(s): LIPASE, AMYLASE in the last 168 hours. Recent Labs  Lab 10/10/19 0542 10/11/19 0536 10/12/19 0431 10/13/19 0335 10/15/19 1026  AMMONIA 87* 58* 30 26 76*   CBC: Recent Labs  Lab 10/10/19 0542 10/11/19 0536 10/12/19 0431  WBC 7.3 8.4 7.9  HGB 7.2* 8.2* 7.4*  HCT 24.9* 28.1* 25.5*  MCV 95.8 95.9 95.5  PLT 69* 125* 126*   Cardiac Enzymes: Recent Labs  Lab  10/12/19 1829  CKTOTAL 15*   BNP: Invalid input(s): POCBNP CBG: Recent Labs  Lab 10/15/19 0438 10/15/19 0748 10/15/19 0851 10/15/19 1157 10/15/19 1545  GLUCAP 81 69* 123* 76 77   D-Dimer No results for input(s): DDIMER in the last 72 hours. Hgb A1c No results for input(s): HGBA1C in the last 72 hours. Lipid Profile No results for input(s): CHOL, HDL, LDLCALC, TRIG, CHOLHDL, LDLDIRECT in the last 72 hours. Thyroid function studies No results for input(s): TSH, T4TOTAL, T3FREE, THYROIDAB in the last 72 hours.  Invalid input(s): FREET3 Anemia work up No results for input(s): VITAMINB12, FOLATE, FERRITIN, TIBC, IRON, RETICCTPCT in the last 72 hours. Urinalysis    Component Value Date/Time   COLORURINE AMBER (A) 10/06/2019 0142   APPEARANCEUR HAZY (A) 10/06/2019 0142   LABSPEC 1.044 (H) 10/06/2019 0142   PHURINE 6.0 10/06/2019 0142   GLUCOSEU NEGATIVE 10/06/2019 0142   HGBUR NEGATIVE 10/06/2019 0142   BILIRUBINUR MODERATE (A) 10/06/2019 0142   KETONESUR 5 (A) 10/06/2019 0142   PROTEINUR NEGATIVE 10/06/2019 0142   UROBILINOGEN 0.2 02/10/2013 1921   NITRITE NEGATIVE 10/06/2019 0142   LEUKOCYTESUR MODERATE (A) 10/06/2019 0142   Sepsis Labs Invalid input(s): PROCALCITONIN,  WBC,  LACTICIDVEN Microbiology Recent Results (from the past 240 hour(s))  Gram stain     Status: None   Collection Time: 10/06/19  3:11 PM   Specimen: Abdomen; Peritoneal Fluid  Result Value Ref Range Status   Specimen Description PERITONEAL  Final   Special Requests NONE  Final   Gram Stain   Final    FEW WBC PRESENT,BOTH PMN AND MONONUCLEAR NO ORGANISMS SEEN Performed at [facility], [address] [place], [place] [address]    Report Status 10/06/2019 FINAL  Final  Culture, body fluid-bottle     Status: None   Collection Time: 10/06/19  3:11 PM   Specimen: Peritoneal Washings  Result Value Ref Range Status   Specimen Description PERITONEAL  Final   Special Requests NONE  Final    Culture   Final    NO GROWTH 5 DAYS Performed at [facility], [place] [address], [place], [place] [address]    Report Status 10/11/2019 FINAL  Final  GI pathogen panel by PCR, stool     Status: None   Collection Time: 10/11/19 10:08 AM   Specimen: Stool  Result Value Ref Range Status   Plesiomonas shigelloides NOT DETECTED NOT DETECTED Final   Yersinia enterocolitica NOT DETECTED NOT DETECTED Final   Vibrio NOT DETECTED NOT DETECTED Final   Enteropathogenic E coli NOT DETECTED NOT DETECTED Final   E coli (ETEC) LT/ST NOT DETECTED NOT DETECTED Final   E coli [id number] by PCR Not applicable NOT DETECTED Final   Cryptosporidium by PCR NOT DETECTED NOT DETECTED Final   Entamoeba histolytica NOT DETECTED NOT DETECTED Final   Adenovirus F 40/41 NOT DETECTED NOT DETECTED Final   Norovirus GI/GII NOT DETECTED NOT DETECTED Final   Sapovirus NOT DETECTED NOT DETECTED Final    Comment: (NOTE) Performed At: [facility] [place], [place] [id number] Rush Farmer MD [id number]    Vibrio cholerae NOT DETECTED NOT DETECTED Final   Campylobacter by PCR NOT DETECTED NOT DETECTED Final   Salmonella by PCR NOT DETECTED NOT DETECTED Final   E coli (STEC) NOT DETECTED NOT DETECTED Final   Enteroaggregative E coli NOT DETECTED NOT DETECTED Final   Shigella by PCR NOT DETECTED NOT DETECTED Final   Cyclospora cayetanensis NOT DETECTED NOT DETECTED Final   Astrovirus NOT DETECTED NOT DETECTED Final   G lamblia by PCR NOT DETECTED NOT DETECTED Final   Rotavirus A by PCR NOT DETECTED NOT DETECTED Final  C difficile quick scan w PCR reflex     Status: None   Collection Time: 10/11/19 10:08 AM   Specimen: Stool  Result Value Ref Range Status   C Diff antigen NEGATIVE NEGATIVE Final   C Diff toxin NEGATIVE NEGATIVE Final   C Diff interpretation No C. difficile detected.  Final    Comment: Performed at [facility], [place] [address] [place], [place] [address]     Time  coordinating discharge:  45 minutes  SIGNED:   Barb Merino, MD  [facility] 10/16/2019, 1:03 PM

## 2019-10-16 NOTE — Plan of Care (Signed)
  Problem: Role Relationship: Goal: Family's ability to cope with current situation will improve Outcome: Progressing   

## 2019-10-16 NOTE — Progress Notes (Addendum)
Patient ID: Kalyani Zumstein, female   DOB: 1986/04/19, 34 y.o.   MRN: [id number]  This NP visited patient at the bedside as a follow up for palliative medicine needs and emotional support.       Mother and grand-mother at bedside.  Patient is minimally  more alert today, she remains with  intermittent elevated temp, hypotension,tachycardia, she is on a morphine drip and transitioning at EOL.  She is agitated      Prognosis is likely days to week  Family understand the seriousness of the situation and limited prognosis.   Hope is for comfort and dignity at this time  Plan of care: -DNR/DNI -no artifical feeding or hydration now or in the future -no further diagnostics, or  life prolonging measure--allow a naturall death -symptom management  for pain, dyspnea, agitation  terminal secretions  (see MAR)    -initiate a morphine gtt with bolus,   Today added scheduled Ativan 2 mg IV every 6 hrs  -patient has stabilized and family is hopeful for residential hospice of [facility]    If patient is able to transfer today to residential hospice,  nursing may cap IV and give bolus of morphine and Ativan prior to transfer.  Discussed natural trajectory and expectations at EOL  Emotional support offered   Discussed with Dr Sloan Leiter via secure chat and [place] LCSW  Total time spent on the unit was 35 minutes  Greater than 50% of the time was spent in counseling and coordination of care  PMT will continue to support holistically   Wadie Lessen NP  Palliative Medicine Team Team Phone # [phone] Pager [phone]

## 2019-10-16 NOTE — Progress Notes (Signed)
Witnessed August Saucer, Rn waste 75cc of morphine

## 2019-11-09 DEATH — deceased

## 2020-07-19 IMAGING — CT CT ABD-PELV W/ CM
2 of 4 series · 15 of 46 positions shown, 17 images · IV contrast (APPLIED)
Comparison: 10/10/2019

CLINICAL DATA: Cirrhosis.

EXAM:
CT ABDOMEN AND PELVIS WITH CONTRAST
TECHNIQUE: Multidetector CT imaging of the abdomen and pelvis was performed
using the standard protocol following bolus administration of
intravenous contrast.
CONTRAST:  100mL OMNIPAQUE IOHEXOL 300 MG/ML  SOLN

[Series 3: abdomen 5.0 · axial · 0.83mm/px · z∈[+939,+1424]mm · 12 of 109 slices shown, 14 images]
[im 6/109  soft-tissue]
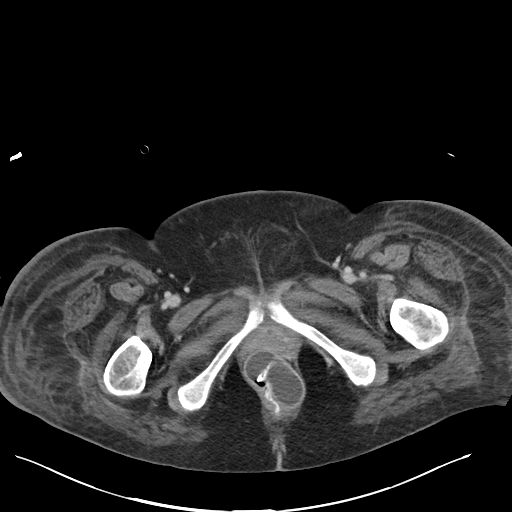
[im 6/109  bone]
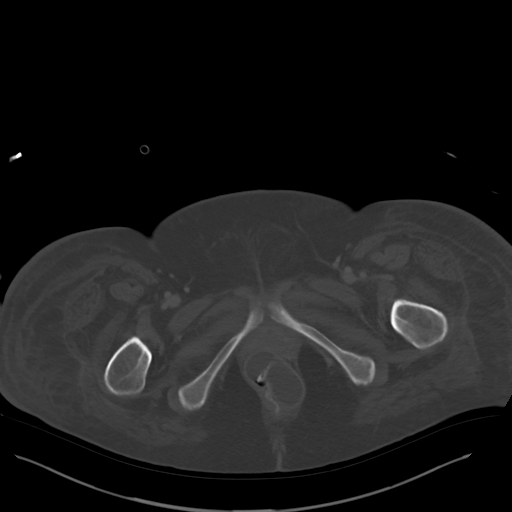
[im 16/109  soft-tissue]
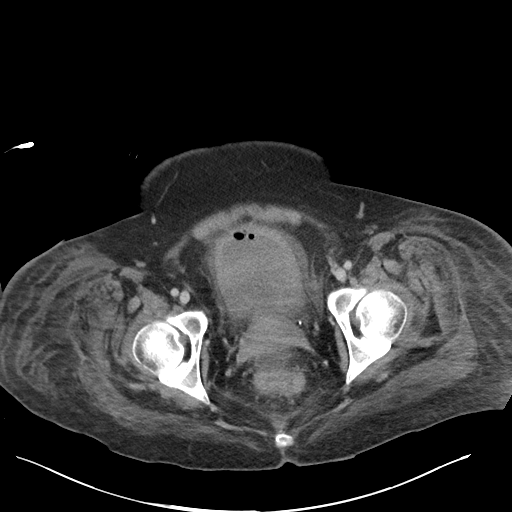
[im 26/109  soft-tissue]
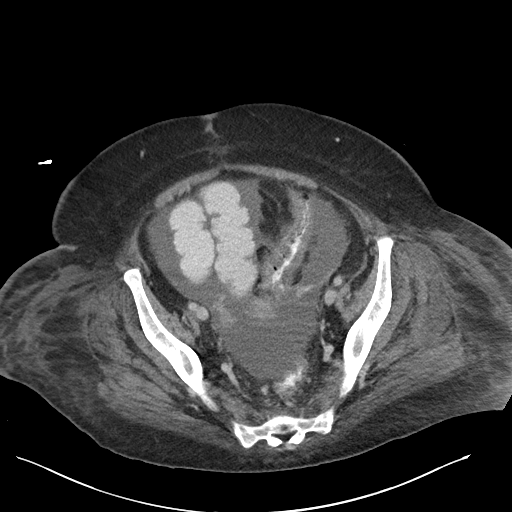
[im 31/109  soft-tissue]
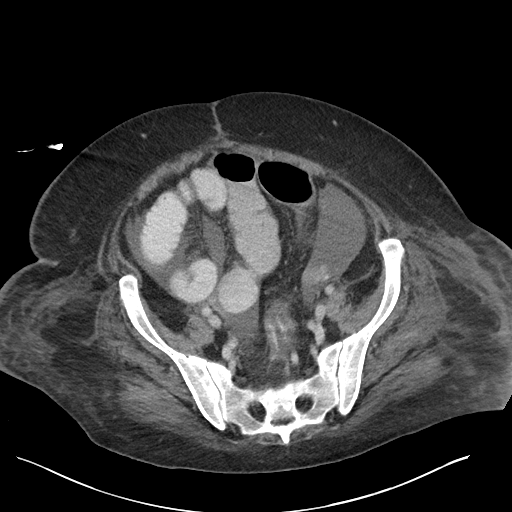
[im 42/109  soft-tissue]
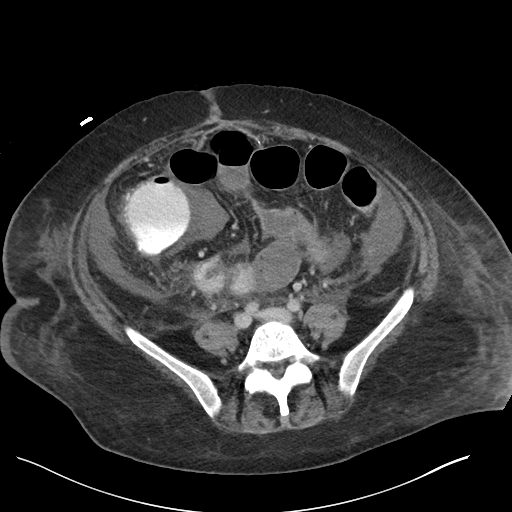
[im 52/109  soft-tissue]
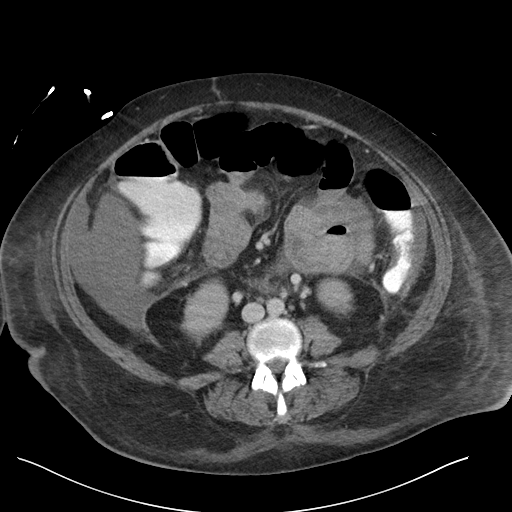
[im 57/109  soft-tissue]
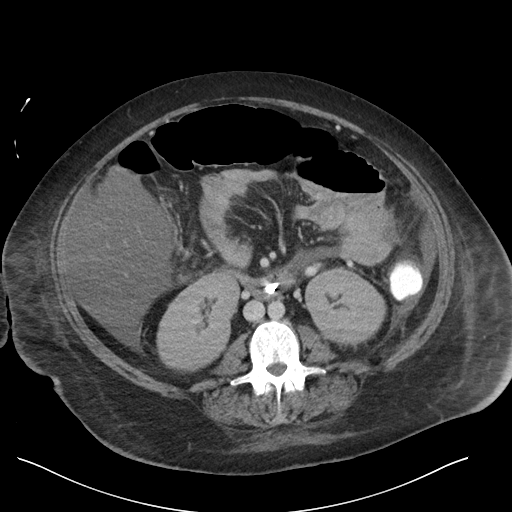
[im 67/109  soft-tissue]
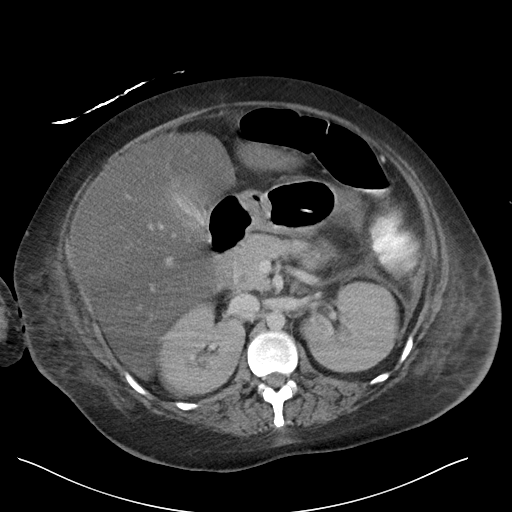
[im 78/109  soft-tissue]
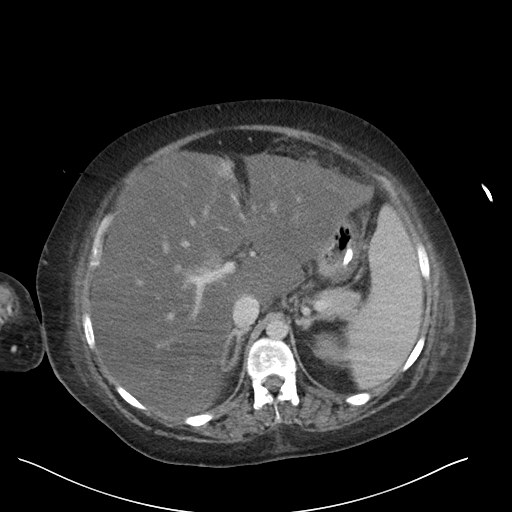
[im 78/109  bone]
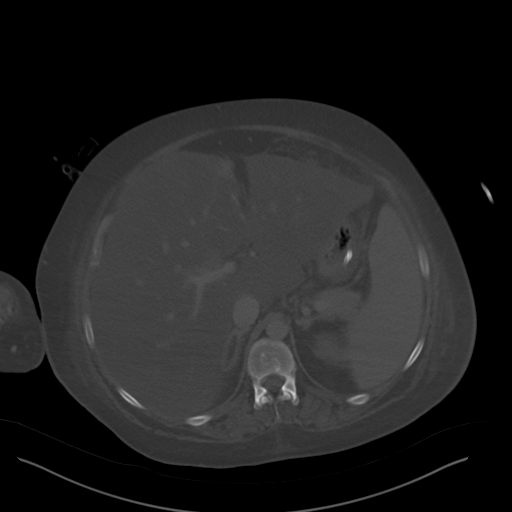
[im 83/109  soft-tissue]
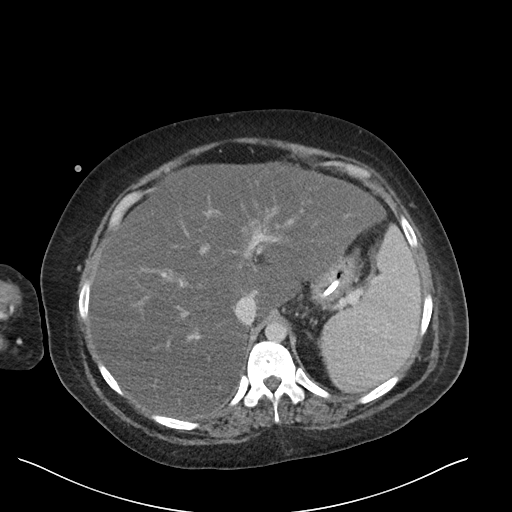
[im 93/109  soft-tissue]
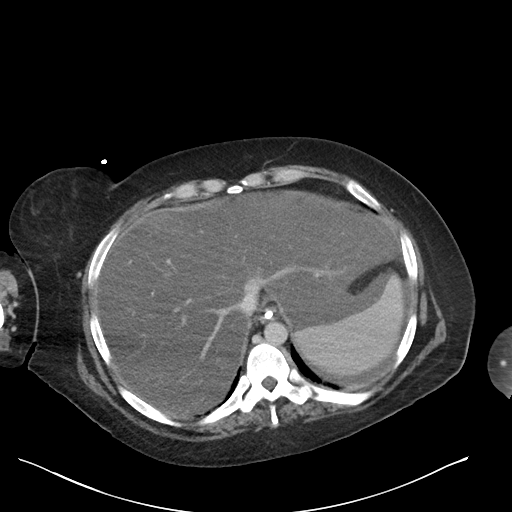
[im 103/109  soft-tissue]
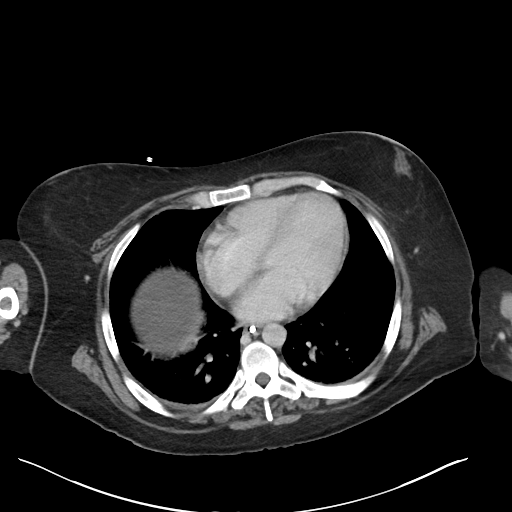

[Series 6: abdomen 3.0 mpr cor · coronal · 0.86mm/px · 3 of 114 slices shown]
[im 38/114  soft-tissue]
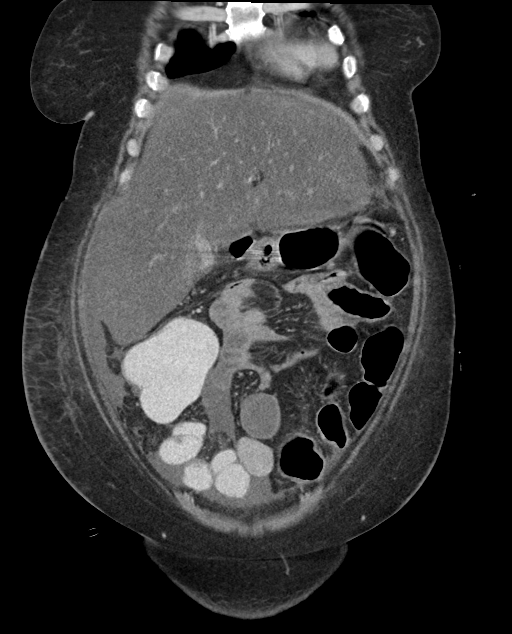
[im 51/114  soft-tissue]
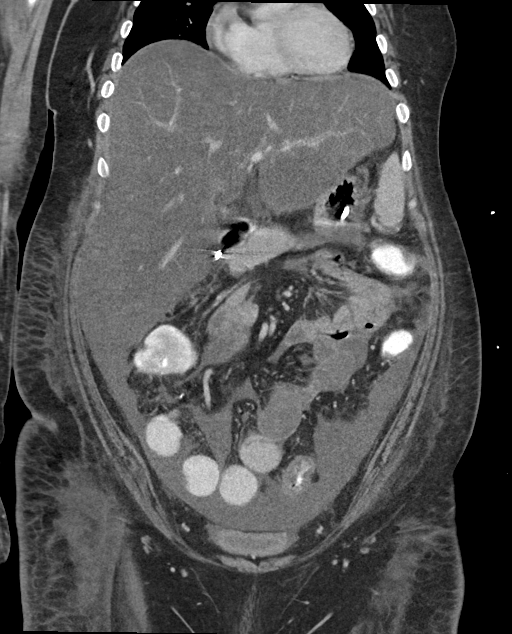
[im 63/114  soft-tissue]
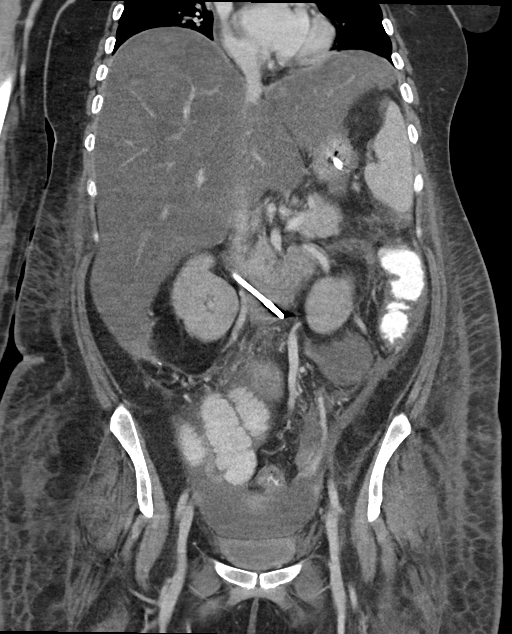

[15 of 46 positions shown; findings below may reference images not displayed]

FINDINGS: Lower chest: Minimal basilar atelectasis.

Hepatobiliary: Liver is enlarged with markedly decreased attenuation
compatible with diffuse fatty deposition. Gallbladder is
nondistended with mucosal hyperemia and gallbladder wall thickening,
stable since prior. No intrahepatic or extrahepatic biliary
dilation.

Pancreas: No focal mass lesion. No dilatation of the main duct. No
intraparenchymal cyst. No peripancreatic edema.

Spleen: No splenomegaly. No focal mass lesion.

Adrenals/Urinary Tract: No adrenal nodule or mass. Left kidney
unremarkable. Tiny hypodensity in the interpolar right kidney is too
small to characterize but likely benign. No evidence for
hydroureter. Bladder is nondistended. Gas bubbles in the bladder
lumen are presumably from recent instrumentation although infection
could have this appearance.

Stomach/Bowel: Stomach is decompressed. Feeding tube tip is in the
transverse duodenum. Proximal small bowel is decompressed. More
distal small bowel loops contain enteric contrast center more
distended, measuring up to 3.8 cm diameter. Patient is status post
right hemicolectomy. Colon is opacified distal to the anastomosis.
The pneumatosis seen in the colon previously has resolved in the
interval. The extraluminal gas seen previously in the sigmoid
mesocolon has resolved in the interval. There is some persistent
irregular wall thickening in the sigmoid colon today. Rectal tube
evident.

Vascular/Lymphatic: No abdominal aortic aneurysm. Portal vein and
superior mesenteric vein are patent without evidence for
intraluminal gas. No abdominal aortic atherosclerotic calcification.
There is no gastrohepatic or hepatoduodenal ligament
lymphadenopathy. No retroperitoneal or mesenteric lymphadenopathy.
No pelvic sidewall lymphadenopathy.

Reproductive: The uterus is unremarkable.  There is no adnexal mass.

Other: Small volume ascites noted in the abdomen, mainly around the
liver and spleen. There is some interloop mesenteric fluid in the
lower abdomen and pelvis with moderate volume free fluid in the
central pelvis. Diffuse body wall edema noted.

Musculoskeletal: No worrisome lytic or sclerotic osseous
abnormality.
IMPRESSION: 1. Interval resolution of the colonic pneumatosis and extraluminal
gas in the sigmoid mesocolon.
2. Persistent irregular wall thickening in the sigmoid colon.
3. Small to moderate volume ascites with diffuse body wall edema.
4. Marked hepatomegaly with diffuse fatty deposition.
5. Gallbladder wall thickening and mucosal hyperemia, similar to
prior.
6. Gas bubbles in the bladder lumen are presumably from recent
instrumentation although infection could have this appearance.
7. Aortic Atherosclerosis ([id number]).
